# Patient Record
Sex: Male | Born: 1950
Health system: Southern US, Community
[De-identification: ages and names within clinical notes are randomized; demographics above are authoritative.]

## PROBLEM LIST (undated history)

## (undated) DIAGNOSIS — G2581 Restless legs syndrome: Secondary | ICD-10-CM

## (undated) DIAGNOSIS — M1990 Unspecified osteoarthritis, unspecified site: Secondary | ICD-10-CM

## (undated) DIAGNOSIS — M069 Rheumatoid arthritis, unspecified: Secondary | ICD-10-CM

## (undated) DIAGNOSIS — I10 Essential (primary) hypertension: Secondary | ICD-10-CM

## (undated) DIAGNOSIS — G629 Polyneuropathy, unspecified: Secondary | ICD-10-CM

## (undated) DIAGNOSIS — I509 Heart failure, unspecified: Secondary | ICD-10-CM

## (undated) DIAGNOSIS — Z87442 Personal history of urinary calculi: Secondary | ICD-10-CM

## (undated) DIAGNOSIS — H269 Unspecified cataract: Secondary | ICD-10-CM

## (undated) DIAGNOSIS — F419 Anxiety disorder, unspecified: Secondary | ICD-10-CM

## (undated) DIAGNOSIS — J439 Emphysema, unspecified: Secondary | ICD-10-CM

## (undated) DIAGNOSIS — T7840XA Allergy, unspecified, initial encounter: Secondary | ICD-10-CM

## (undated) DIAGNOSIS — K219 Gastro-esophageal reflux disease without esophagitis: Secondary | ICD-10-CM

## (undated) DIAGNOSIS — R7303 Prediabetes: Secondary | ICD-10-CM

## (undated) DIAGNOSIS — G4730 Sleep apnea, unspecified: Secondary | ICD-10-CM

## (undated) DIAGNOSIS — G4733 Obstructive sleep apnea (adult) (pediatric): Secondary | ICD-10-CM

## (undated) DIAGNOSIS — E785 Hyperlipidemia, unspecified: Secondary | ICD-10-CM

## (undated) DIAGNOSIS — M4800 Spinal stenosis, site unspecified: Secondary | ICD-10-CM

## (undated) HISTORY — DX: Spinal stenosis, site unspecified: M48.00

## (undated) HISTORY — DX: Heart failure, unspecified: I50.9

## (undated) HISTORY — DX: Essential (primary) hypertension: I10

## (undated) HISTORY — DX: Hyperlipidemia, unspecified: E78.5

## (undated) HISTORY — PX: AMPUTATION TOE: SHX6595

## (undated) HISTORY — DX: Personal history of urinary calculi: Z87.442

## (undated) HISTORY — PX: APPENDECTOMY: SHX54

## (undated) HISTORY — DX: Unspecified cataract: H26.9

## (undated) HISTORY — DX: Allergy, unspecified, initial encounter: T78.40XA

## (undated) HISTORY — DX: Obstructive sleep apnea (adult) (pediatric): G47.33

## (undated) HISTORY — DX: Restless legs syndrome: G25.81

## (undated) HISTORY — DX: Rheumatoid arthritis, unspecified: M06.9

## (undated) HISTORY — DX: Emphysema, unspecified: J43.9

## (undated) HISTORY — DX: Anxiety disorder, unspecified: F41.9

## (undated) HISTORY — PX: EYE SURGERY: SHX253

## (undated) HISTORY — DX: Gastro-esophageal reflux disease without esophagitis: K21.9

## (undated) HISTORY — DX: Sleep apnea, unspecified: G47.30

## (undated) HISTORY — DX: Prediabetes: R73.03

## (undated) HISTORY — DX: Polyneuropathy, unspecified: G62.9

## (undated) HISTORY — PX: CARPAL TUNNEL RELEASE: SHX101

## (undated) HISTORY — PX: SPINE SURGERY: SHX786

## (undated) HISTORY — DX: Unspecified osteoarthritis, unspecified site: M19.90

---

## 1999-12-01 ENCOUNTER — Encounter: Payer: Self-pay | Admitting: *Deleted

## 1999-12-01 ENCOUNTER — Ambulatory Visit (HOSPITAL_COMMUNITY): Admission: RE | Admit: 1999-12-01 | Discharge: 1999-12-01 | Payer: Self-pay | Admitting: *Deleted

## 1999-12-15 ENCOUNTER — Ambulatory Visit (HOSPITAL_COMMUNITY): Admission: RE | Admit: 1999-12-15 | Discharge: 1999-12-15 | Payer: Self-pay | Admitting: *Deleted

## 1999-12-15 ENCOUNTER — Encounter: Payer: Self-pay | Admitting: *Deleted

## 1999-12-29 ENCOUNTER — Encounter: Payer: Self-pay | Admitting: *Deleted

## 1999-12-29 ENCOUNTER — Ambulatory Visit (HOSPITAL_COMMUNITY): Admission: RE | Admit: 1999-12-29 | Discharge: 1999-12-29 | Payer: Self-pay | Admitting: *Deleted

## 2000-01-31 ENCOUNTER — Encounter: Payer: Self-pay | Admitting: *Deleted

## 2000-02-06 ENCOUNTER — Ambulatory Visit (HOSPITAL_COMMUNITY): Admission: RE | Admit: 2000-02-06 | Discharge: 2000-02-07 | Payer: Self-pay | Admitting: *Deleted

## 2000-02-06 ENCOUNTER — Encounter: Payer: Self-pay | Admitting: *Deleted

## 2004-08-08 ENCOUNTER — Inpatient Hospital Stay (HOSPITAL_COMMUNITY): Admission: RE | Admit: 2004-08-08 | Discharge: 2004-08-10 | Payer: Self-pay | Admitting: Neurosurgery

## 2005-02-09 ENCOUNTER — Ambulatory Visit: Payer: Self-pay | Admitting: Oncology

## 2005-12-04 IMAGING — CR DG CHEST 2V
2 series · 2 of 2 positions shown · non-contrast
Comparison: none

CLINICAL DATA: HNP, spondylosis.   Preoperative chest. 
 CHEST, TWO VIEWS 08/03/04 
 The lungs are clear.  The heart is upper normal in size, and there are no mediastinal abnormalities. 
 IMPRESSION
 No evidence for active chest disease.

[view not recorded (1 of 2)]
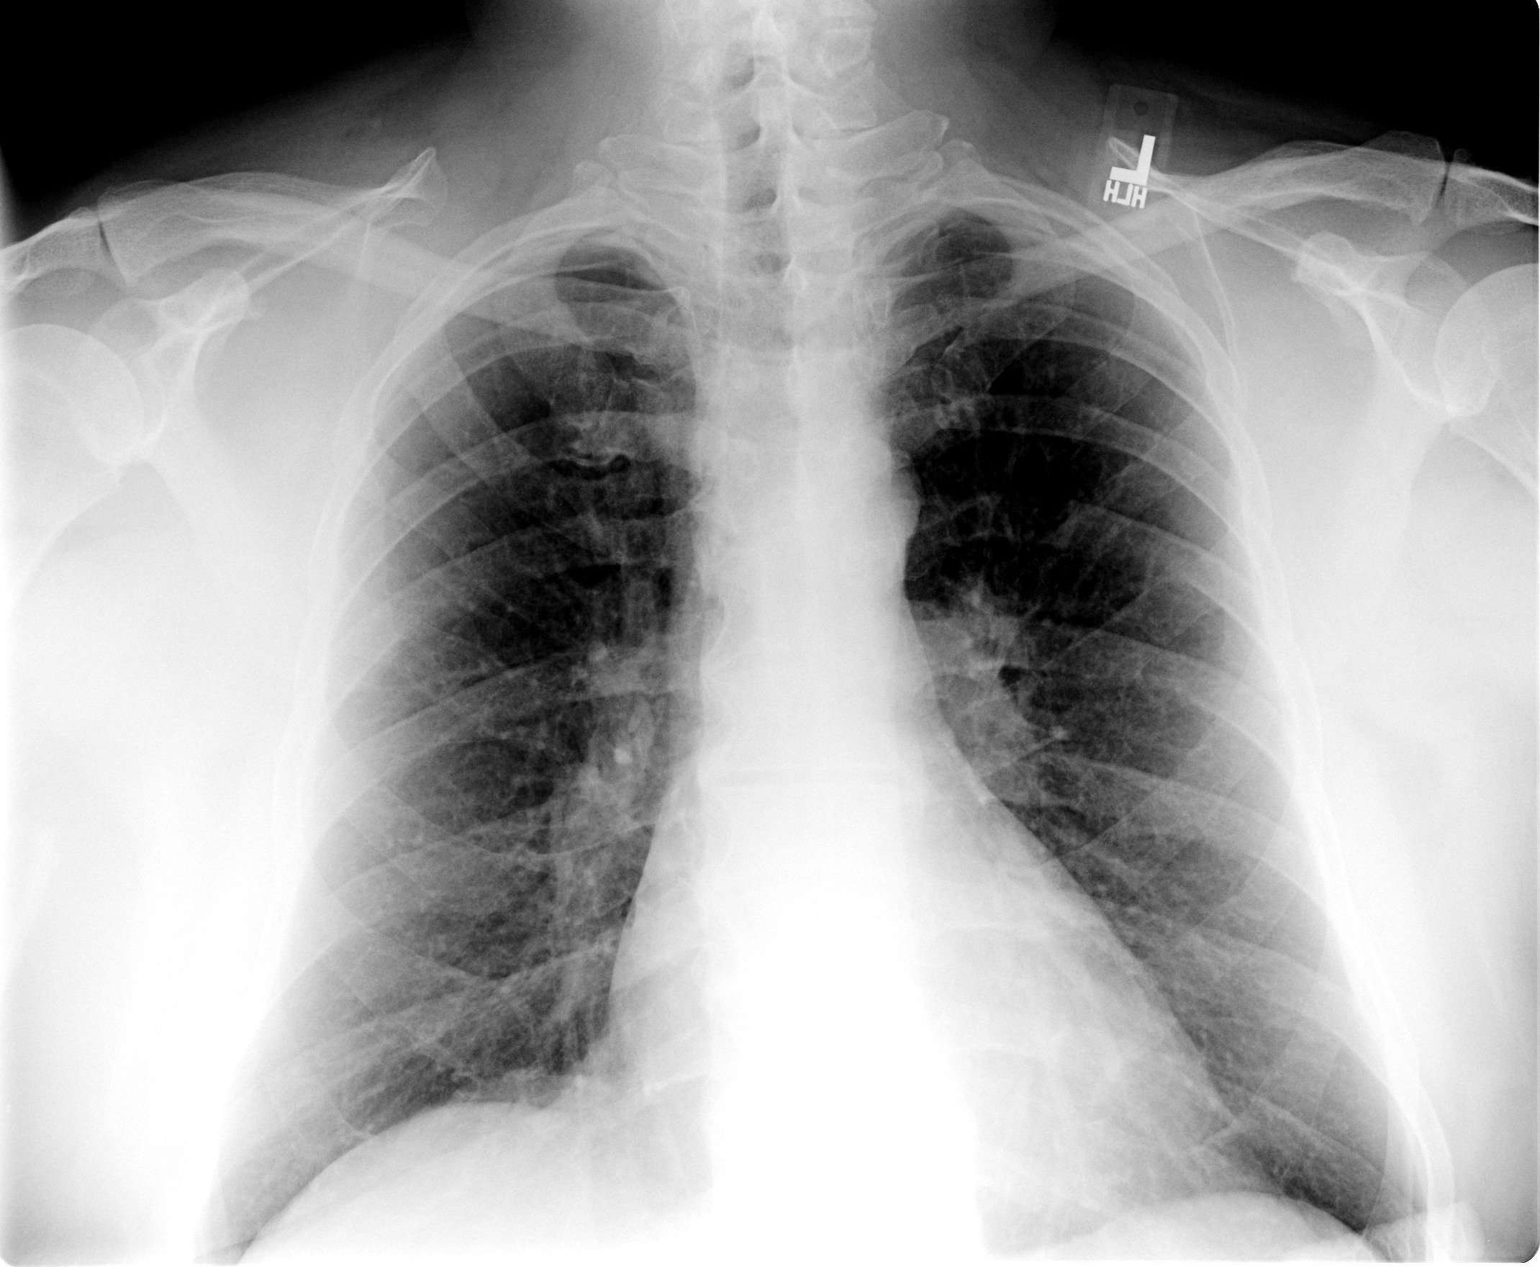

[view not recorded (2 of 2)]
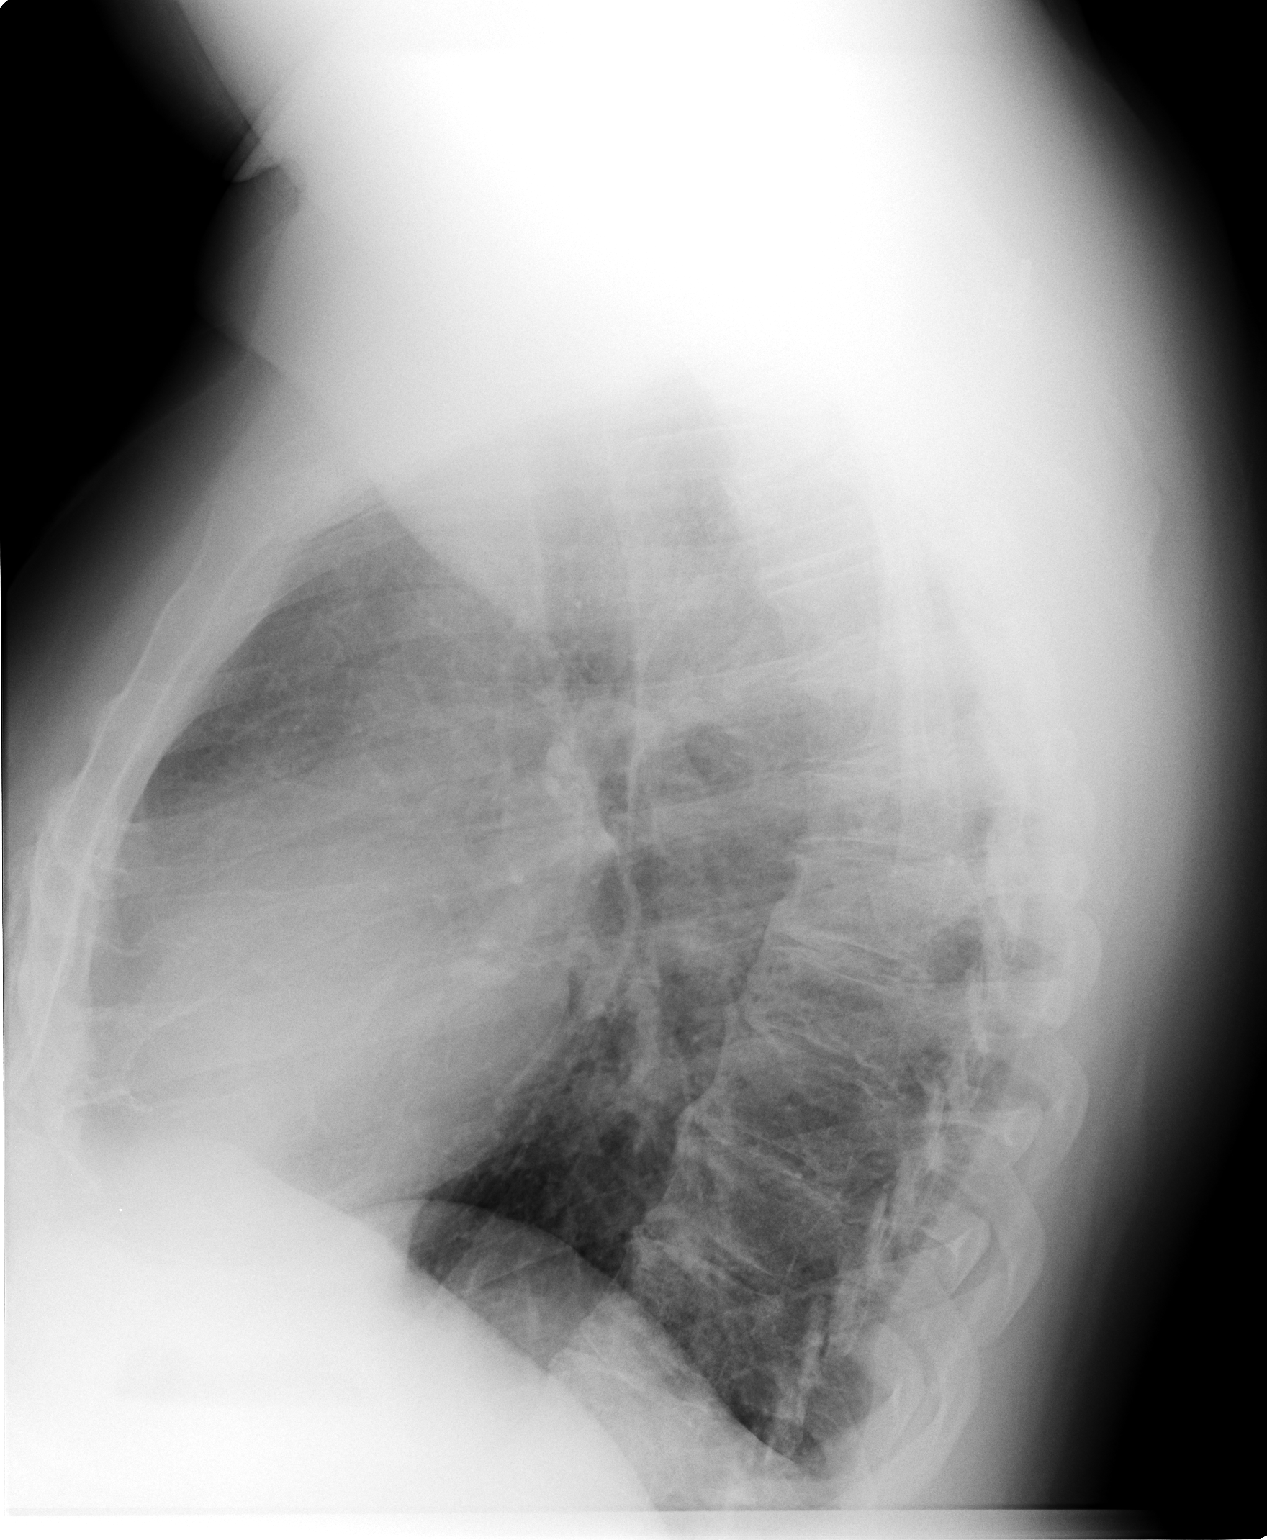

[2 of 2 positions shown; findings below may reference images not displayed]

## 2007-06-02 ENCOUNTER — Encounter: Admission: RE | Admit: 2007-06-02 | Discharge: 2007-06-02 | Payer: Self-pay | Admitting: Orthopaedic Surgery

## 2011-04-06 NOTE — Op Note (Signed)
NAME:  Shawn Osborn, Shawn Osborn NO.:  [id number]   MEDICAL RECORD NO.:  [id number]          PATIENT TYPE:  INP   LOCATION:  2899                         FACILITY:  [facility]   PHYSICIAN:  Clydene Fake, M.D.  DATE OF BIRTH:  13-Sep-1951   DATE OF PROCEDURE:  08/08/2004  DATE OF DISCHARGE:                                 OPERATIVE REPORT   PREOPERATIVE DIAGNOSIS:  Recurrent lumbar stenosis and herniated nucleus  pulposus.   POSTOPERATIVE DIAGNOSIS:  Recurrent lumbar stenosis and herniated nucleus  pulposus.   OPERATION PERFORMED:  L2-3 and 3-4 decompressive laminectomies (redo), left  L3-4 diskectomy, microdissection with microscope.   SURGEON:  Clydene Fake, M.D.   ASSISTANT:  Coletta Memos, M.D.   ANESTHESIA:  General endotracheal.   ESTIMATED BLOOD LOSS:  100 mL.   BLOOD REPLACED:  None.   DRAINS:  None.   COMPLICATIONS:  None.   INDICATIONS FOR PROCEDURE:  The patient is a 60 year old gentleman who had  decompressive lumbar laminectomy a few years ago.  He has been having  increasing back pain, pain radiating into his legs, left leg pain and MRI  was done showing recurrent stenosis, L3-4 with disk herniation on the left  side.  He is brought for redo decompressive lamina and diskectomy.   DESCRIPTION OF PROCEDURE:  The patient was brought to the operating room and  general anesthesia induced.  The patient was placed in prone position on the  Wilson frame and all pressure points padded.  The patient was prepped and  draped in sterile fashion and site of incision was injected with 20 mL of 1%  lidocaine with epinephrine.  Incision was then made at the site of the  previous midline incision on the lower lumbar spine but increased in size  more cephalad.  Incision taken down to fascia and hemostasis was obtained  with Bovie cautery.  The fascia was incised and subperiosteal dissection was  done over the L2 and 3 spinous processes and lamina out to the facets.   Down  below there was no lamina of 4-5, dissected carefully through the scar  tissue.  As we dissected out the edge of inferior to L3 inferiorly we did  not dissect down __________  midline much at that point.  Leksell rongeur  was used to perform L3 laminectomy and high speed drill was used to continue  the laminectomy and Kerrison punches and high speed drill were used to  continue laminectomy  removing the incomplete lamina of L3 and  the bottom  of L2 to get 2-3 and 3-4 decompressive laminectomy.  There was a lot of scar  tissue in the inferior margin of 3-4 and we had to carefully dissect through  this and we found some remnants of L4 lamina that we also removed.  We  dissected out laterally and got a good central decompression.  Foraminotomy  was done of the 4 roots bilaterally.  There was a lot of scar on the left  side right around the 4 root.  We had to carefully dissect through this.  Lateral ligaments were then removed  with Kerrison punches, also to  decompress the stenosis.  On the left side we explored the epidural space.  Large disk bulge and circular disk herniation was seen and inferior to disk  space there was subligamentous fragment within a tear in this ligament with  huge amounts of free fragment out under the central lateral dura under the 4  root.  We removed these pieces of free fragment disk and after doing so,  were able to decompress the 4 root and central canal well.  We explored this  disk area some more, followed this tear up into the disk space. The disk  space was incised with a 15 blade and diskectomy performed with pituitary  rongeurs and curettes.  When we were finished, we had good decompression of  the central canal and lateral recesses.  He had nerve roots in the left  side, 3 and 4 roots were decompressed.  Osteophyte removers were used to  remove osteophytes from the end plates of L3 and 4 on the left side.  Wound  was irrigated with antibiotic  solution.  Hemostasis obtained with Gelfoam  and thrombin.  This was then irrigated out.  Retractors were removed.  Note  microscope was brought in for microdissection during the case __________  laminectomy throughout the dissection through scar and diskectomy.  Wound  was irrigated with antibiotic solution. Retractors were removed.  Fascia was  closed with 0 Vicryl interrupted suture.  The subcutaneous tissue was closed  with 0, 2-0 and 3-0 Vicryl interrupted suture.  Skin closed with Benzoin and  Steri-Strips.  Dressing was placed.  The patient was placed back in supine  position, awakened from anesthesia and transferred to recovery room in  stable condition.       JRH/MEDQ  D:  08/08/2004  T:  08/09/2004  Job:  [id number]

## 2011-04-06 NOTE — H&P (Signed)
[place]  Patient:    Shawn Osborn, Shawn Osborn                      MRN: Adm. Date:  02/05/00 Attending:  Reynolds Bowl, M.D.                         History and Physical  HISTORY OF PRESENT ILLNESS:  Mr. Cosman is a 60 year old man with complaint that for two years he has been having various degrees of back problems, and over the  last six to 12 months has been getting worse, such that now standing or walking for short distances causes pain to radiate down to his feet, along with a sensation of burning bilaterally.  He has not had any bowel or bladder or sexual dysfunction. He has been evaluated with MRI and found to have fairly severe stenosis at L4-5 and less so at L3-4 and L5-S1.  We discussed decompression, possible complications, the fact that there are no guaranties.  The patient has elected to proceed on with surgery.  ALLERGIES:  The patient states that he has had PENICILLIN in the past and he passed out, and believes that he also had a rash.  Was told not to take PENICILLIN any more.  SOCIAL HISTORY:  He smokes one pack to 1-1/2 packs of cigarettes per day.  PAST MEDICAL HISTORY/REVIEW OF SYSTEMS:  He has a history of high blood pressure but cannot recall the exact medication he is taking.  He has a prior history of  kidney stones.  Otherwise the review of systems is negative except when we discussed hemorrhoids.  He said he remembers he has hemorrhoids and a history of intermittent hemorrhoid bleeding.  He was evaluated in 2000, and his doctor advised him that his prostate was all right, but he should see a doctor or a surgeon regarding possible hemorrhoid surgery.  PHYSICAL EXAMINATION:  VITAL SIGNS:  Temperature 97.5 degrees, pulse 60, respirations 12, blood pressure 126/88.  He is 5 feet 11 inches, 292 pounds.  GENERAL:  He is very pleasant.  Does not seem to be in pain.  NEUROLOGIC/MUSCULOSKELETAL:  He can toe walk,  heel walk, and get up on the stool without problems.  Straight leg raising is negative, other than causing some low back discomfort.  Both ankle reflexes are 0.  Knee reflexes are 1+.  Manual motor examination is normal.  There is no pretibial edema.  There is normal distribution of hair.  The skin is warm.  There is a good dorsalis pedis pulse.  HEENT:  Pupils equal, reactive to light and accommodation.  Extraocular movements full.  Tympanic membranes normal.  Pharynx clear except for two dentures.  NECK:  Moves well without discomfort.  No palpable mass.  No carotid bruits.  CHEST:  Expansion fair.  Breath sounds, volume fair.  Sounds are clear.  HEART:  A regular rhythm, no murmurs heard.  ABDOMEN:  Obese, no palpable masses, no bruits heard.  X-rays and MRI show severe stenosis at L4-5 and less stenosis at L3-4 and L5-S1.  PLAN:  Today we discussed again the possible complications from decompression, he fact that there are no guaranties.  He seemed to understand, as did his wife.  wrote a prescription for Tylox and discussed the postoperative course, gradual increase in activities. I will plan on seeing him back in the office one week postoperatively.     His pelvis  is level.  No focal tenderness.  DD:  02/05/00 TD:  02/05/00 Job: [id number]

## 2011-12-11 ENCOUNTER — Encounter: Payer: Self-pay | Admitting: Vascular Surgery

## 2011-12-12 ENCOUNTER — Encounter: Payer: Self-pay | Admitting: Vascular Surgery

## 2011-12-13 ENCOUNTER — Ambulatory Visit (INDEPENDENT_AMBULATORY_CARE_PROVIDER_SITE_OTHER): Payer: PRIVATE HEALTH INSURANCE | Admitting: Vascular Surgery

## 2011-12-13 ENCOUNTER — Encounter: Payer: Self-pay | Admitting: Vascular Surgery

## 2011-12-13 ENCOUNTER — Ambulatory Visit (INDEPENDENT_AMBULATORY_CARE_PROVIDER_SITE_OTHER): Payer: PRIVATE HEALTH INSURANCE | Admitting: *Deleted

## 2011-12-13 VITALS — BP 154/83 | HR 75 | Resp 18 | Ht 71.0 in | Wt 299.8 lb

## 2011-12-13 DIAGNOSIS — H34219 Partial retinal artery occlusion, unspecified eye: Secondary | ICD-10-CM

## 2011-12-13 DIAGNOSIS — I6529 Occlusion and stenosis of unspecified carotid artery: Secondary | ICD-10-CM | POA: Insufficient documentation

## 2011-12-13 HISTORY — DX: Occlusion and stenosis of unspecified carotid artery: I65.29

## 2011-12-13 NOTE — Progress Notes (Signed)
History of Present Illness:  Patient is a 61 y.o. year old male who presents for evaluation of carotid stenosis.  Symptoms related to this stenosis include evidence of retinal emboli on ophthalmologic exam.  The patient denies symptoms of TIA, amaurosis, or stroke. The patient denies any visual changes. His initial ophthalmologic visit was prompted by erythema and pain in his left eye. The patient is currently on Plavix antiplatelet therapy.  The patient underwent a thorough investigation of this including an MRI of the brain which showed no stroke, MRA of the head which showed no significant carotid stenosis, carotid duplex exam which showed external carotid stenosis but no internal carotid stenosis.  Other medical problems include CHF, hyperlipidemia, hypertension, borderline diabetes, arthritis.  These are currently stable and followed by his primary care physician. He smokes 1 pack of cigarettes per day  Past Medical History  Diagnosis Date  . CHF (congestive heart failure)   . Hyperlipidemia   . Hypertension   . Chronic bronchitis   . GERD (gastroesophageal reflux disease)   . History of kidney stones   . Arthritis     Gout, osteoarthritis in Back and Knees  . Rheumatoid arthritis   . Prediabetes   . RLS (restless legs syndrome)   . 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 Obstructive sleep apnea   . Spinal stenosis     Past Surgical History  Procedure Date  . Appendectomy   . Spine surgery 1998, 2003, 2012    Laminectomy X 3  . Carpal tunnel release      Social History History  Substance Use Topics  . Smoking status: Current Everyday Smoker -- 1.0 packs/day  . Smokeless tobacco: Not on file  . Alcohol Use: No    Family History Family History  Problem Relation Age of Onset  . Heart disease Mother   . Hypertension Mother     Allergies  Allergies  Allergen Reactions  . Avelox (Moxifloxacin Hcl In Nacl) Swelling  . Codeine Phosphate Itching    . Cozaar   . Cymbalta (Duloxetine Hcl)     Twitching  . Statins     myalgia  . Welchol (Colesevelam Hcl)   . Penicillins Rash     Current Outpatient Prescriptions  Medication Sig Dispense Refill  . albuterol (PROVENTIL) (2.5 MG/3ML) 0.083% nebulizer solution Take 2.5 mg by nebulization every 6 (six) hours as needed.      Marland Kitchen aspirin 81 MG tablet Take 81 mg by mouth daily.      . budesonide-formoterol (SYMBICORT) 160-4.5 MCG/ACT inhaler Inhale 2 puffs into the lungs 2 (two) times daily.      . clonazePAM (KLONOPIN) 1 MG tablet Take 1 mg by mouth at bedtime as needed.      . cloNIDine (CATAPRES) 0.3 MG tablet Take 0.3 mg by mouth 2 (two) times daily.      Marland Kitchen FOLATE-B12-INTRINSIC FACTOR PO Take by mouth daily.       . folic acid (FOLVITE) 800 MCG tablet Take 400 mcg by mouth daily.      Marland Kitchen HYDROcodone-acetaminophen (NORCO) 10-325 MG per tablet Take 1 tablet by mouth every 8 (eight) hours as needed.      . hydroxychloroquine (PLAQUENIL) 200 MG tablet Take by mouth 2 (two) times daily.      . methotrexate (RHEUMATREX) 2.5 MG tablet Take 2.5 mg by mouth once a week. Caution:Chemotherapy. Protect from light.    Take 8 tablets weekly      .  mometasone (NASONEX) 50 MCG/ACT nasal spray Place 2 sprays into the nose daily.      Marland Kitchen omega-3 acid ethyl esters (LOVAZA) 1 G capsule Take 2 g by mouth 2 (two) times daily.      Marland Kitchen omeprazole (PRILOSEC) 20 MG capsule Take 20 mg by mouth 2 (two) times daily.      . quinapril (ACCUPRIL) 40 MG tablet Take 40 mg by mouth 2 (two) times daily.      Marland Kitchen rOPINIRole (REQUIP) 2 MG tablet Take 2 mg by mouth at bedtime.      . furosemide (LASIX) 20 MG tablet Take 20 mg by mouth as needed.      . predniSONE (DELTASONE) 20 MG tablet Take 20 mg by mouth 2 (two) times daily.      . traZODone (DESYREL) 100 MG tablet Take 100 mg by mouth at bedtime.      . valACYclovir (VALTREX) 500 MG tablet Take 500 mg by mouth as needed.        ROS:   General:  No weight loss, Fever,  chills  HEENT: No recent headaches, no nasal bleeding, no visual changes, no sore throat  Neurologic: No dizziness, blackouts, seizures. No recent symptoms of stroke or mini- stroke. No recent episodes of slurred speech, or temporary blindness.  Cardiac: No recent episodes of chest pain/pressure, no shortness of breath at rest.  No shortness of breath with exertion.  Denies history of atrial fibrillation or irregular heartbeat  Vascular: No history of rest pain in feet.  No history of claudication.  No history of non-healing ulcer, No history of DVT   Pulmonary: No home oxygen, no productive cough, no hemoptysis,  No asthma or wheezing  Musculoskeletal:  [x ] Arthritis, [x ] Low back pain,  [x ] Joint pain  Hematologic:No history of hypercoagulable state.  No history of easy bleeding.  No history of anemia  Gastrointestinal: No hematochezia or melena,  No gastroesophageal reflux, no trouble swallowing  Urinary: [ ]  chronic Kidney disease, [ ]  on HD - [ ]  MWF or [ ]  TTHS, [ ]  Burning with urination, [ ]  Frequent urination, [ ]  Difficulty urinating;   Skin: No rashes  Psychological: No history of anxiety,  No history of depression   Physical Examination  Filed Vitals:   12/13/11 1412  BP: 154/83  Pulse: 75  Resp: 18  Height: 5\' 11"  (1.803 m)  Weight: 299 lb 12.8 oz (135.988 kg)    Body mass index is 41.81 kg/(m^2).  General:  Alert and oriented, no acute distress HEENT: Normal Neck: No bruit or JVD Pulmonary: Clear to auscultation bilaterally Cardiac: Regular Rate and Rhythm without murmur Gastrointestinal: Soft, non-tender, non-distended, no mass, obese Skin: No rash Extremity Pulses:  2+ radial, brachial, femoral pulses bilaterally Musculoskeletal: No deformity or edema  Neurologic: Upper and lower extremity motor 5/5 and symmetric  DATA: He had a repeat carotid duplex exam in our office today which showed less than 40% stenosis bilaterally with some irregularity of  the intima. I reviewed and interpreted this study   ASSESSMENT: Evidence of retinal emboli with no significant carotid bifurcation internal carotid artery stenosis   PLAN: Continue antiplatelet therapy in the form of Plavix, followup as needed if he has recurrence or development any new neurologic symptoms  Fabienne Bruns, MD [facility] Office: [phone] Pager: [phone]    Fabienne Bruns, MD [facility] Office: [phone] Pager: [phone]

## 2011-12-18 DIAGNOSIS — I6529 Occlusion and stenosis of unspecified carotid artery: Secondary | ICD-10-CM

## 2011-12-20 NOTE — Procedures (Unsigned)
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 Diabetes:  No Cardiac:  CHF, EF of 45%, cardiomyopathy Hypertension:  Yes Smoking:  Yes Previous Surgery:  Back surgery x3 CV History: Amaurosis Fugax No, Paresthesias No, Hemiparesis No                                      RIGHT             LEFT Brachial systolic pressure:         178               178 Brachial Doppler waveforms:         Biphasic          Biphasic Vertebral direction of flow:        Antegrade         Antegrade DUPLEX VELOCITIES (cm/sec) CCA peak systolic                   109               109 ECA peak systolic                   84                293 ICA peak systolic                   79 (mid)          90 ICA end diastolic                   36                38 PLAQUE MORPHOLOGY:                  Mixed, irregular  Homogeneous, irregular PLAQUE AMOUNT:                      Mild              Moderate PLAQUE LOCATION:                    Bifurcation and ICA.                Bifurcation, ICA and ECA.  IMPRESSION: 1. 0% to 39% ICA stenosis bilaterally. 2. Left ECA stenosis.  ___________________________________________ Janetta Hora Fields, MD  SS/MEDQ  D:  12/13/2011  T:  12/13/2011  Job:  [id number]

## 2013-07-08 DIAGNOSIS — E7800 Pure hypercholesterolemia, unspecified: Secondary | ICD-10-CM | POA: Diagnosis not present

## 2013-07-08 DIAGNOSIS — G4730 Sleep apnea, unspecified: Secondary | ICD-10-CM | POA: Diagnosis not present

## 2013-07-08 DIAGNOSIS — I10 Essential (primary) hypertension: Secondary | ICD-10-CM | POA: Diagnosis not present

## 2013-07-08 DIAGNOSIS — R7301 Impaired fasting glucose: Secondary | ICD-10-CM | POA: Diagnosis not present

## 2013-07-08 DIAGNOSIS — M069 Rheumatoid arthritis, unspecified: Secondary | ICD-10-CM | POA: Diagnosis not present

## 2013-07-08 DIAGNOSIS — M545 Low back pain: Secondary | ICD-10-CM | POA: Diagnosis not present

## 2013-07-28 DIAGNOSIS — R937 Abnormal findings on diagnostic imaging of other parts of musculoskeletal system: Secondary | ICD-10-CM | POA: Diagnosis not present

## 2013-07-28 DIAGNOSIS — M25569 Pain in unspecified knee: Secondary | ICD-10-CM | POA: Diagnosis not present

## 2013-07-30 DIAGNOSIS — M2550 Pain in unspecified joint: Secondary | ICD-10-CM | POA: Diagnosis not present

## 2013-07-30 DIAGNOSIS — IMO0002 Reserved for concepts with insufficient information to code with codable children: Secondary | ICD-10-CM | POA: Diagnosis not present

## 2013-07-30 DIAGNOSIS — M1710 Unilateral primary osteoarthritis, unspecified knee: Secondary | ICD-10-CM | POA: Diagnosis not present

## 2013-07-30 DIAGNOSIS — M545 Low back pain: Secondary | ICD-10-CM | POA: Diagnosis not present

## 2013-08-03 DIAGNOSIS — Z981 Arthrodesis status: Secondary | ICD-10-CM | POA: Diagnosis not present

## 2013-08-03 DIAGNOSIS — M519 Unspecified thoracic, thoracolumbar and lumbosacral intervertebral disc disorder: Secondary | ICD-10-CM | POA: Diagnosis not present

## 2013-08-03 DIAGNOSIS — M545 Low back pain: Secondary | ICD-10-CM | POA: Diagnosis not present

## 2013-08-11 DIAGNOSIS — M2550 Pain in unspecified joint: Secondary | ICD-10-CM | POA: Diagnosis not present

## 2013-08-11 DIAGNOSIS — M48061 Spinal stenosis, lumbar region without neurogenic claudication: Secondary | ICD-10-CM | POA: Diagnosis not present

## 2013-08-11 DIAGNOSIS — Z23 Encounter for immunization: Secondary | ICD-10-CM | POA: Diagnosis not present

## 2013-08-11 DIAGNOSIS — IMO0002 Reserved for concepts with insufficient information to code with codable children: Secondary | ICD-10-CM | POA: Diagnosis not present

## 2013-08-11 DIAGNOSIS — F329 Major depressive disorder, single episode, unspecified: Secondary | ICD-10-CM | POA: Diagnosis not present

## 2013-08-11 DIAGNOSIS — M1710 Unilateral primary osteoarthritis, unspecified knee: Secondary | ICD-10-CM | POA: Diagnosis not present

## 2013-08-14 DIAGNOSIS — M4310 Spondylolisthesis, site unspecified: Secondary | ICD-10-CM | POA: Diagnosis not present

## 2013-08-14 DIAGNOSIS — M48061 Spinal stenosis, lumbar region without neurogenic claudication: Secondary | ICD-10-CM | POA: Diagnosis not present

## 2013-08-14 DIAGNOSIS — M545 Low back pain: Secondary | ICD-10-CM | POA: Diagnosis not present

## 2013-08-17 DIAGNOSIS — J441 Chronic obstructive pulmonary disease with (acute) exacerbation: Secondary | ICD-10-CM | POA: Diagnosis not present

## 2013-08-17 DIAGNOSIS — Z01810 Encounter for preprocedural cardiovascular examination: Secondary | ICD-10-CM | POA: Diagnosis not present

## 2013-08-17 DIAGNOSIS — J449 Chronic obstructive pulmonary disease, unspecified: Secondary | ICD-10-CM | POA: Diagnosis not present

## 2013-08-17 DIAGNOSIS — R233 Spontaneous ecchymoses: Secondary | ICD-10-CM | POA: Diagnosis not present

## 2013-08-17 DIAGNOSIS — I10 Essential (primary) hypertension: Secondary | ICD-10-CM | POA: Diagnosis not present

## 2013-08-17 DIAGNOSIS — F17200 Nicotine dependence, unspecified, uncomplicated: Secondary | ICD-10-CM | POA: Diagnosis not present

## 2013-08-28 DIAGNOSIS — M48061 Spinal stenosis, lumbar region without neurogenic claudication: Secondary | ICD-10-CM | POA: Diagnosis not present

## 2013-08-28 DIAGNOSIS — L600 Ingrowing nail: Secondary | ICD-10-CM | POA: Diagnosis not present

## 2013-08-28 DIAGNOSIS — J449 Chronic obstructive pulmonary disease, unspecified: Secondary | ICD-10-CM | POA: Diagnosis not present

## 2013-08-28 DIAGNOSIS — F17200 Nicotine dependence, unspecified, uncomplicated: Secondary | ICD-10-CM | POA: Diagnosis not present

## 2013-09-11 DIAGNOSIS — M48061 Spinal stenosis, lumbar region without neurogenic claudication: Secondary | ICD-10-CM | POA: Diagnosis not present

## 2013-09-11 DIAGNOSIS — L600 Ingrowing nail: Secondary | ICD-10-CM | POA: Diagnosis not present

## 2013-09-11 DIAGNOSIS — F17200 Nicotine dependence, unspecified, uncomplicated: Secondary | ICD-10-CM | POA: Diagnosis not present

## 2013-09-11 DIAGNOSIS — J449 Chronic obstructive pulmonary disease, unspecified: Secondary | ICD-10-CM | POA: Diagnosis not present

## 2013-09-18 DIAGNOSIS — F17200 Nicotine dependence, unspecified, uncomplicated: Secondary | ICD-10-CM | POA: Diagnosis not present

## 2013-09-18 DIAGNOSIS — L600 Ingrowing nail: Secondary | ICD-10-CM | POA: Diagnosis not present

## 2013-09-18 DIAGNOSIS — F329 Major depressive disorder, single episode, unspecified: Secondary | ICD-10-CM | POA: Diagnosis not present

## 2013-10-02 DIAGNOSIS — Z9889 Other specified postprocedural states: Secondary | ICD-10-CM | POA: Diagnosis not present

## 2013-10-02 DIAGNOSIS — M47817 Spondylosis without myelopathy or radiculopathy, lumbosacral region: Secondary | ICD-10-CM | POA: Diagnosis not present

## 2013-10-02 DIAGNOSIS — M4310 Spondylolisthesis, site unspecified: Secondary | ICD-10-CM | POA: Diagnosis not present

## 2013-10-02 DIAGNOSIS — M961 Postlaminectomy syndrome, not elsewhere classified: Secondary | ICD-10-CM | POA: Diagnosis not present

## 2013-10-02 DIAGNOSIS — J449 Chronic obstructive pulmonary disease, unspecified: Secondary | ICD-10-CM | POA: Diagnosis not present

## 2013-10-02 DIAGNOSIS — I10 Essential (primary) hypertension: Secondary | ICD-10-CM | POA: Diagnosis not present

## 2013-10-02 DIAGNOSIS — M545 Low back pain: Secondary | ICD-10-CM | POA: Diagnosis not present

## 2013-10-02 DIAGNOSIS — M069 Rheumatoid arthritis, unspecified: Secondary | ICD-10-CM | POA: Diagnosis not present

## 2013-10-02 DIAGNOSIS — Z981 Arthrodesis status: Secondary | ICD-10-CM | POA: Diagnosis not present

## 2013-10-02 DIAGNOSIS — M48061 Spinal stenosis, lumbar region without neurogenic claudication: Secondary | ICD-10-CM | POA: Diagnosis not present

## 2013-10-02 DIAGNOSIS — M5106 Intervertebral disc disorders with myelopathy, lumbar region: Secondary | ICD-10-CM | POA: Diagnosis not present

## 2013-10-02 DIAGNOSIS — Z23 Encounter for immunization: Secondary | ICD-10-CM | POA: Diagnosis not present

## 2013-10-02 DIAGNOSIS — IMO0002 Reserved for concepts with insufficient information to code with codable children: Secondary | ICD-10-CM | POA: Diagnosis not present

## 2013-10-02 DIAGNOSIS — T84498A Other mechanical complication of other internal orthopedic devices, implants and grafts, initial encounter: Secondary | ICD-10-CM | POA: Diagnosis not present

## 2013-10-02 DIAGNOSIS — R5381 Other malaise: Secondary | ICD-10-CM | POA: Diagnosis not present

## 2013-10-02 DIAGNOSIS — M47814 Spondylosis without myelopathy or radiculopathy, thoracic region: Secondary | ICD-10-CM | POA: Diagnosis not present

## 2013-10-02 DIAGNOSIS — Z5189 Encounter for other specified aftercare: Secondary | ICD-10-CM | POA: Diagnosis not present

## 2013-10-02 DIAGNOSIS — Q762 Congenital spondylolisthesis: Secondary | ICD-10-CM | POA: Diagnosis not present

## 2013-10-08 DIAGNOSIS — F411 Generalized anxiety disorder: Secondary | ICD-10-CM | POA: Diagnosis not present

## 2013-10-08 DIAGNOSIS — M069 Rheumatoid arthritis, unspecified: Secondary | ICD-10-CM | POA: Diagnosis not present

## 2013-10-08 DIAGNOSIS — F329 Major depressive disorder, single episode, unspecified: Secondary | ICD-10-CM | POA: Diagnosis not present

## 2013-10-08 DIAGNOSIS — Z9889 Other specified postprocedural states: Secondary | ICD-10-CM | POA: Diagnosis not present

## 2013-10-08 DIAGNOSIS — M47814 Spondylosis without myelopathy or radiculopathy, thoracic region: Secondary | ICD-10-CM | POA: Diagnosis not present

## 2013-10-08 DIAGNOSIS — G2581 Restless legs syndrome: Secondary | ICD-10-CM | POA: Diagnosis not present

## 2013-10-08 DIAGNOSIS — F17200 Nicotine dependence, unspecified, uncomplicated: Secondary | ICD-10-CM | POA: Diagnosis not present

## 2013-10-08 DIAGNOSIS — E669 Obesity, unspecified: Secondary | ICD-10-CM | POA: Diagnosis not present

## 2013-10-08 DIAGNOSIS — K219 Gastro-esophageal reflux disease without esophagitis: Secondary | ICD-10-CM | POA: Diagnosis not present

## 2013-10-08 DIAGNOSIS — Z5189 Encounter for other specified aftercare: Secondary | ICD-10-CM | POA: Diagnosis not present

## 2013-10-08 DIAGNOSIS — J449 Chronic obstructive pulmonary disease, unspecified: Secondary | ICD-10-CM | POA: Diagnosis not present

## 2013-10-08 DIAGNOSIS — G4733 Obstructive sleep apnea (adult) (pediatric): Secondary | ICD-10-CM | POA: Diagnosis not present

## 2013-10-08 DIAGNOSIS — I10 Essential (primary) hypertension: Secondary | ICD-10-CM | POA: Diagnosis not present

## 2013-10-08 DIAGNOSIS — M216X9 Other acquired deformities of unspecified foot: Secondary | ICD-10-CM | POA: Diagnosis not present

## 2013-10-08 DIAGNOSIS — R5381 Other malaise: Secondary | ICD-10-CM | POA: Diagnosis not present

## 2013-10-08 DIAGNOSIS — M109 Gout, unspecified: Secondary | ICD-10-CM | POA: Diagnosis not present

## 2013-10-21 DIAGNOSIS — I10 Essential (primary) hypertension: Secondary | ICD-10-CM | POA: Diagnosis not present

## 2013-10-21 DIAGNOSIS — F329 Major depressive disorder, single episode, unspecified: Secondary | ICD-10-CM | POA: Diagnosis not present

## 2013-10-21 DIAGNOSIS — J441 Chronic obstructive pulmonary disease with (acute) exacerbation: Secondary | ICD-10-CM | POA: Diagnosis not present

## 2013-10-21 DIAGNOSIS — IMO0002 Reserved for concepts with insufficient information to code with codable children: Secondary | ICD-10-CM | POA: Diagnosis not present

## 2013-10-21 DIAGNOSIS — Z4789 Encounter for other orthopedic aftercare: Secondary | ICD-10-CM | POA: Diagnosis not present

## 2013-10-23 DIAGNOSIS — I10 Essential (primary) hypertension: Secondary | ICD-10-CM | POA: Diagnosis not present

## 2013-10-23 DIAGNOSIS — J441 Chronic obstructive pulmonary disease with (acute) exacerbation: Secondary | ICD-10-CM | POA: Diagnosis not present

## 2013-10-23 DIAGNOSIS — Z4789 Encounter for other orthopedic aftercare: Secondary | ICD-10-CM | POA: Diagnosis not present

## 2013-10-23 DIAGNOSIS — F329 Major depressive disorder, single episode, unspecified: Secondary | ICD-10-CM | POA: Diagnosis not present

## 2013-10-23 DIAGNOSIS — IMO0002 Reserved for concepts with insufficient information to code with codable children: Secondary | ICD-10-CM | POA: Diagnosis not present

## 2013-10-26 DIAGNOSIS — I10 Essential (primary) hypertension: Secondary | ICD-10-CM | POA: Diagnosis not present

## 2013-10-26 DIAGNOSIS — IMO0002 Reserved for concepts with insufficient information to code with codable children: Secondary | ICD-10-CM | POA: Diagnosis not present

## 2013-10-26 DIAGNOSIS — Z4789 Encounter for other orthopedic aftercare: Secondary | ICD-10-CM | POA: Diagnosis not present

## 2013-10-26 DIAGNOSIS — F329 Major depressive disorder, single episode, unspecified: Secondary | ICD-10-CM | POA: Diagnosis not present

## 2013-10-26 DIAGNOSIS — J441 Chronic obstructive pulmonary disease with (acute) exacerbation: Secondary | ICD-10-CM | POA: Diagnosis not present

## 2013-10-27 DIAGNOSIS — IMO0002 Reserved for concepts with insufficient information to code with codable children: Secondary | ICD-10-CM | POA: Diagnosis not present

## 2013-10-27 DIAGNOSIS — Z4789 Encounter for other orthopedic aftercare: Secondary | ICD-10-CM | POA: Diagnosis not present

## 2013-10-27 DIAGNOSIS — I10 Essential (primary) hypertension: Secondary | ICD-10-CM | POA: Diagnosis not present

## 2013-10-27 DIAGNOSIS — F329 Major depressive disorder, single episode, unspecified: Secondary | ICD-10-CM | POA: Diagnosis not present

## 2013-10-27 DIAGNOSIS — J441 Chronic obstructive pulmonary disease with (acute) exacerbation: Secondary | ICD-10-CM | POA: Diagnosis not present

## 2013-10-28 DIAGNOSIS — I10 Essential (primary) hypertension: Secondary | ICD-10-CM | POA: Diagnosis not present

## 2013-10-28 DIAGNOSIS — Z4789 Encounter for other orthopedic aftercare: Secondary | ICD-10-CM | POA: Diagnosis not present

## 2013-10-28 DIAGNOSIS — F329 Major depressive disorder, single episode, unspecified: Secondary | ICD-10-CM | POA: Diagnosis not present

## 2013-10-28 DIAGNOSIS — IMO0002 Reserved for concepts with insufficient information to code with codable children: Secondary | ICD-10-CM | POA: Diagnosis not present

## 2013-10-28 DIAGNOSIS — J441 Chronic obstructive pulmonary disease with (acute) exacerbation: Secondary | ICD-10-CM | POA: Diagnosis not present

## 2013-10-29 DIAGNOSIS — Z4789 Encounter for other orthopedic aftercare: Secondary | ICD-10-CM | POA: Diagnosis not present

## 2013-10-29 DIAGNOSIS — IMO0002 Reserved for concepts with insufficient information to code with codable children: Secondary | ICD-10-CM | POA: Diagnosis not present

## 2013-10-29 DIAGNOSIS — I10 Essential (primary) hypertension: Secondary | ICD-10-CM | POA: Diagnosis not present

## 2013-10-29 DIAGNOSIS — J441 Chronic obstructive pulmonary disease with (acute) exacerbation: Secondary | ICD-10-CM | POA: Diagnosis not present

## 2013-10-29 DIAGNOSIS — F329 Major depressive disorder, single episode, unspecified: Secondary | ICD-10-CM | POA: Diagnosis not present

## 2013-11-02 DIAGNOSIS — R7309 Other abnormal glucose: Secondary | ICD-10-CM | POA: Diagnosis not present

## 2013-11-02 DIAGNOSIS — R7301 Impaired fasting glucose: Secondary | ICD-10-CM | POA: Diagnosis not present

## 2013-11-02 DIAGNOSIS — M545 Low back pain: Secondary | ICD-10-CM | POA: Diagnosis not present

## 2013-11-02 DIAGNOSIS — M48061 Spinal stenosis, lumbar region without neurogenic claudication: Secondary | ICD-10-CM | POA: Diagnosis not present

## 2013-11-02 DIAGNOSIS — F329 Major depressive disorder, single episode, unspecified: Secondary | ICD-10-CM | POA: Diagnosis not present

## 2013-11-02 DIAGNOSIS — I10 Essential (primary) hypertension: Secondary | ICD-10-CM | POA: Diagnosis not present

## 2013-11-02 DIAGNOSIS — M79609 Pain in unspecified limb: Secondary | ICD-10-CM | POA: Diagnosis not present

## 2013-11-02 DIAGNOSIS — E7800 Pure hypercholesterolemia, unspecified: Secondary | ICD-10-CM | POA: Diagnosis not present

## 2013-11-03 DIAGNOSIS — F329 Major depressive disorder, single episode, unspecified: Secondary | ICD-10-CM | POA: Diagnosis not present

## 2013-11-03 DIAGNOSIS — I10 Essential (primary) hypertension: Secondary | ICD-10-CM | POA: Diagnosis not present

## 2013-11-03 DIAGNOSIS — IMO0002 Reserved for concepts with insufficient information to code with codable children: Secondary | ICD-10-CM | POA: Diagnosis not present

## 2013-11-03 DIAGNOSIS — Z4789 Encounter for other orthopedic aftercare: Secondary | ICD-10-CM | POA: Diagnosis not present

## 2013-11-03 DIAGNOSIS — J441 Chronic obstructive pulmonary disease with (acute) exacerbation: Secondary | ICD-10-CM | POA: Diagnosis not present

## 2013-11-05 DIAGNOSIS — F329 Major depressive disorder, single episode, unspecified: Secondary | ICD-10-CM | POA: Diagnosis not present

## 2013-11-05 DIAGNOSIS — Z4789 Encounter for other orthopedic aftercare: Secondary | ICD-10-CM | POA: Diagnosis not present

## 2013-11-05 DIAGNOSIS — IMO0002 Reserved for concepts with insufficient information to code with codable children: Secondary | ICD-10-CM | POA: Diagnosis not present

## 2013-11-05 DIAGNOSIS — J441 Chronic obstructive pulmonary disease with (acute) exacerbation: Secondary | ICD-10-CM | POA: Diagnosis not present

## 2013-11-05 DIAGNOSIS — I10 Essential (primary) hypertension: Secondary | ICD-10-CM | POA: Diagnosis not present

## 2013-11-05 DIAGNOSIS — D649 Anemia, unspecified: Secondary | ICD-10-CM | POA: Diagnosis not present

## 2013-11-06 DIAGNOSIS — IMO0002 Reserved for concepts with insufficient information to code with codable children: Secondary | ICD-10-CM | POA: Diagnosis not present

## 2013-11-06 DIAGNOSIS — Z4789 Encounter for other orthopedic aftercare: Secondary | ICD-10-CM | POA: Diagnosis not present

## 2013-11-06 DIAGNOSIS — I10 Essential (primary) hypertension: Secondary | ICD-10-CM | POA: Diagnosis not present

## 2013-11-06 DIAGNOSIS — J441 Chronic obstructive pulmonary disease with (acute) exacerbation: Secondary | ICD-10-CM | POA: Diagnosis not present

## 2013-11-06 DIAGNOSIS — F329 Major depressive disorder, single episode, unspecified: Secondary | ICD-10-CM | POA: Diagnosis not present

## 2013-11-10 DIAGNOSIS — J441 Chronic obstructive pulmonary disease with (acute) exacerbation: Secondary | ICD-10-CM | POA: Diagnosis not present

## 2013-11-10 DIAGNOSIS — I10 Essential (primary) hypertension: Secondary | ICD-10-CM | POA: Diagnosis not present

## 2013-11-10 DIAGNOSIS — IMO0002 Reserved for concepts with insufficient information to code with codable children: Secondary | ICD-10-CM | POA: Diagnosis not present

## 2013-11-10 DIAGNOSIS — Z4789 Encounter for other orthopedic aftercare: Secondary | ICD-10-CM | POA: Diagnosis not present

## 2013-11-10 DIAGNOSIS — F329 Major depressive disorder, single episode, unspecified: Secondary | ICD-10-CM | POA: Diagnosis not present

## 2013-11-11 DIAGNOSIS — F329 Major depressive disorder, single episode, unspecified: Secondary | ICD-10-CM | POA: Diagnosis not present

## 2013-11-11 DIAGNOSIS — J441 Chronic obstructive pulmonary disease with (acute) exacerbation: Secondary | ICD-10-CM | POA: Diagnosis not present

## 2013-11-11 DIAGNOSIS — Z4789 Encounter for other orthopedic aftercare: Secondary | ICD-10-CM | POA: Diagnosis not present

## 2013-11-11 DIAGNOSIS — IMO0002 Reserved for concepts with insufficient information to code with codable children: Secondary | ICD-10-CM | POA: Diagnosis not present

## 2013-11-11 DIAGNOSIS — I10 Essential (primary) hypertension: Secondary | ICD-10-CM | POA: Diagnosis not present

## 2013-11-17 DIAGNOSIS — N390 Urinary tract infection, site not specified: Secondary | ICD-10-CM | POA: Diagnosis not present

## 2013-11-17 DIAGNOSIS — J0100 Acute maxillary sinusitis, unspecified: Secondary | ICD-10-CM | POA: Diagnosis not present

## 2013-11-17 DIAGNOSIS — N3000 Acute cystitis without hematuria: Secondary | ICD-10-CM | POA: Diagnosis not present

## 2013-12-07 DIAGNOSIS — D649 Anemia, unspecified: Secondary | ICD-10-CM | POA: Diagnosis not present

## 2013-12-23 DIAGNOSIS — T84498A Other mechanical complication of other internal orthopedic devices, implants and grafts, initial encounter: Secondary | ICD-10-CM | POA: Diagnosis not present

## 2013-12-23 DIAGNOSIS — M5106 Intervertebral disc disorders with myelopathy, lumbar region: Secondary | ICD-10-CM | POA: Diagnosis not present

## 2013-12-23 DIAGNOSIS — M4800 Spinal stenosis, site unspecified: Secondary | ICD-10-CM | POA: Diagnosis not present

## 2013-12-23 DIAGNOSIS — M4310 Spondylolisthesis, site unspecified: Secondary | ICD-10-CM | POA: Diagnosis not present

## 2014-01-21 DIAGNOSIS — T84498A Other mechanical complication of other internal orthopedic devices, implants and grafts, initial encounter: Secondary | ICD-10-CM | POA: Diagnosis not present

## 2014-01-21 DIAGNOSIS — M4310 Spondylolisthesis, site unspecified: Secondary | ICD-10-CM | POA: Diagnosis not present

## 2014-01-21 DIAGNOSIS — M4800 Spinal stenosis, site unspecified: Secondary | ICD-10-CM | POA: Diagnosis not present

## 2014-01-21 DIAGNOSIS — M5106 Intervertebral disc disorders with myelopathy, lumbar region: Secondary | ICD-10-CM | POA: Diagnosis not present

## 2014-02-08 DIAGNOSIS — E7800 Pure hypercholesterolemia, unspecified: Secondary | ICD-10-CM | POA: Diagnosis not present

## 2014-02-08 DIAGNOSIS — R7309 Other abnormal glucose: Secondary | ICD-10-CM | POA: Diagnosis not present

## 2014-02-08 DIAGNOSIS — Z79899 Other long term (current) drug therapy: Secondary | ICD-10-CM | POA: Diagnosis not present

## 2014-02-08 DIAGNOSIS — R7301 Impaired fasting glucose: Secondary | ICD-10-CM | POA: Diagnosis not present

## 2014-02-08 DIAGNOSIS — F341 Dysthymic disorder: Secondary | ICD-10-CM | POA: Diagnosis not present

## 2014-02-08 DIAGNOSIS — J449 Chronic obstructive pulmonary disease, unspecified: Secondary | ICD-10-CM | POA: Diagnosis not present

## 2014-02-08 DIAGNOSIS — M48061 Spinal stenosis, lumbar region without neurogenic claudication: Secondary | ICD-10-CM | POA: Diagnosis not present

## 2014-02-26 DIAGNOSIS — M4800 Spinal stenosis, site unspecified: Secondary | ICD-10-CM | POA: Diagnosis not present

## 2014-02-26 DIAGNOSIS — M5106 Intervertebral disc disorders with myelopathy, lumbar region: Secondary | ICD-10-CM | POA: Diagnosis not present

## 2014-02-26 DIAGNOSIS — M4310 Spondylolisthesis, site unspecified: Secondary | ICD-10-CM | POA: Diagnosis not present

## 2014-02-26 DIAGNOSIS — T84498A Other mechanical complication of other internal orthopedic devices, implants and grafts, initial encounter: Secondary | ICD-10-CM | POA: Diagnosis not present

## 2014-03-24 DIAGNOSIS — M5106 Intervertebral disc disorders with myelopathy, lumbar region: Secondary | ICD-10-CM | POA: Diagnosis not present

## 2014-03-24 DIAGNOSIS — M4800 Spinal stenosis, site unspecified: Secondary | ICD-10-CM | POA: Diagnosis not present

## 2014-03-24 DIAGNOSIS — M4310 Spondylolisthesis, site unspecified: Secondary | ICD-10-CM | POA: Diagnosis not present

## 2014-03-24 DIAGNOSIS — T84498A Other mechanical complication of other internal orthopedic devices, implants and grafts, initial encounter: Secondary | ICD-10-CM | POA: Diagnosis not present

## 2014-05-25 DIAGNOSIS — M5106 Intervertebral disc disorders with myelopathy, lumbar region: Secondary | ICD-10-CM | POA: Diagnosis not present

## 2014-05-25 DIAGNOSIS — M4800 Spinal stenosis, site unspecified: Secondary | ICD-10-CM | POA: Diagnosis not present

## 2014-05-25 DIAGNOSIS — M4310 Spondylolisthesis, site unspecified: Secondary | ICD-10-CM | POA: Diagnosis not present

## 2014-05-25 DIAGNOSIS — T84498A Other mechanical complication of other internal orthopedic devices, implants and grafts, initial encounter: Secondary | ICD-10-CM | POA: Diagnosis not present

## 2014-06-03 DIAGNOSIS — J449 Chronic obstructive pulmonary disease, unspecified: Secondary | ICD-10-CM | POA: Diagnosis not present

## 2014-06-03 DIAGNOSIS — R7301 Impaired fasting glucose: Secondary | ICD-10-CM | POA: Diagnosis not present

## 2014-06-03 DIAGNOSIS — I10 Essential (primary) hypertension: Secondary | ICD-10-CM | POA: Diagnosis not present

## 2014-06-03 DIAGNOSIS — R0989 Other specified symptoms and signs involving the circulatory and respiratory systems: Secondary | ICD-10-CM | POA: Diagnosis not present

## 2014-06-03 DIAGNOSIS — M48061 Spinal stenosis, lumbar region without neurogenic claudication: Secondary | ICD-10-CM | POA: Diagnosis not present

## 2014-06-03 DIAGNOSIS — F341 Dysthymic disorder: Secondary | ICD-10-CM | POA: Diagnosis not present

## 2014-06-03 DIAGNOSIS — E7800 Pure hypercholesterolemia, unspecified: Secondary | ICD-10-CM | POA: Diagnosis not present

## 2014-06-03 DIAGNOSIS — J018 Other acute sinusitis: Secondary | ICD-10-CM | POA: Diagnosis not present

## 2014-06-03 DIAGNOSIS — R7309 Other abnormal glucose: Secondary | ICD-10-CM | POA: Diagnosis not present

## 2014-06-03 DIAGNOSIS — Z125 Encounter for screening for malignant neoplasm of prostate: Secondary | ICD-10-CM | POA: Diagnosis not present

## 2014-06-16 DIAGNOSIS — R0989 Other specified symptoms and signs involving the circulatory and respiratory systems: Secondary | ICD-10-CM | POA: Diagnosis not present

## 2014-06-16 DIAGNOSIS — M25559 Pain in unspecified hip: Secondary | ICD-10-CM | POA: Diagnosis not present

## 2014-07-13 DIAGNOSIS — Z0000 Encounter for general adult medical examination without abnormal findings: Secondary | ICD-10-CM | POA: Diagnosis not present

## 2014-07-13 DIAGNOSIS — Z1211 Encounter for screening for malignant neoplasm of colon: Secondary | ICD-10-CM | POA: Diagnosis not present

## 2014-07-23 DIAGNOSIS — Z79899 Other long term (current) drug therapy: Secondary | ICD-10-CM | POA: Diagnosis not present

## 2014-07-23 DIAGNOSIS — M4800 Spinal stenosis, site unspecified: Secondary | ICD-10-CM | POA: Diagnosis not present

## 2014-07-23 DIAGNOSIS — M4310 Spondylolisthesis, site unspecified: Secondary | ICD-10-CM | POA: Diagnosis not present

## 2014-07-23 DIAGNOSIS — T84498A Other mechanical complication of other internal orthopedic devices, implants and grafts, initial encounter: Secondary | ICD-10-CM | POA: Diagnosis not present

## 2014-07-23 DIAGNOSIS — M5106 Intervertebral disc disorders with myelopathy, lumbar region: Secondary | ICD-10-CM | POA: Diagnosis not present

## 2014-08-05 DIAGNOSIS — R1033 Periumbilical pain: Secondary | ICD-10-CM | POA: Diagnosis not present

## 2014-08-05 DIAGNOSIS — R933 Abnormal findings on diagnostic imaging of other parts of digestive tract: Secondary | ICD-10-CM | POA: Diagnosis not present

## 2014-08-05 DIAGNOSIS — R1013 Epigastric pain: Secondary | ICD-10-CM | POA: Diagnosis not present

## 2014-08-05 DIAGNOSIS — R197 Diarrhea, unspecified: Secondary | ICD-10-CM | POA: Diagnosis not present

## 2014-08-06 DIAGNOSIS — R1013 Epigastric pain: Secondary | ICD-10-CM | POA: Diagnosis not present

## 2014-08-06 DIAGNOSIS — R1033 Periumbilical pain: Secondary | ICD-10-CM | POA: Diagnosis not present

## 2014-08-06 DIAGNOSIS — R197 Diarrhea, unspecified: Secondary | ICD-10-CM | POA: Diagnosis not present

## 2014-08-13 DIAGNOSIS — K529 Noninfective gastroenteritis and colitis, unspecified: Secondary | ICD-10-CM | POA: Diagnosis not present

## 2014-08-13 DIAGNOSIS — F17200 Nicotine dependence, unspecified, uncomplicated: Secondary | ICD-10-CM | POA: Diagnosis not present

## 2014-09-10 DIAGNOSIS — I10 Essential (primary) hypertension: Secondary | ICD-10-CM | POA: Diagnosis not present

## 2014-09-10 DIAGNOSIS — E780 Pure hypercholesterolemia: Secondary | ICD-10-CM | POA: Diagnosis not present

## 2014-09-10 DIAGNOSIS — G2581 Restless legs syndrome: Secondary | ICD-10-CM | POA: Diagnosis not present

## 2014-09-10 DIAGNOSIS — E782 Mixed hyperlipidemia: Secondary | ICD-10-CM | POA: Diagnosis not present

## 2014-09-10 DIAGNOSIS — J449 Chronic obstructive pulmonary disease, unspecified: Secondary | ICD-10-CM | POA: Diagnosis not present

## 2014-09-10 DIAGNOSIS — G4733 Obstructive sleep apnea (adult) (pediatric): Secondary | ICD-10-CM | POA: Diagnosis not present

## 2014-09-10 DIAGNOSIS — R7301 Impaired fasting glucose: Secondary | ICD-10-CM | POA: Diagnosis not present

## 2014-09-10 DIAGNOSIS — M4806 Spinal stenosis, lumbar region: Secondary | ICD-10-CM | POA: Diagnosis not present

## 2014-09-10 DIAGNOSIS — Z23 Encounter for immunization: Secondary | ICD-10-CM | POA: Diagnosis not present

## 2014-09-10 DIAGNOSIS — F17210 Nicotine dependence, cigarettes, uncomplicated: Secondary | ICD-10-CM | POA: Diagnosis not present

## 2014-09-15 DIAGNOSIS — Z86010 Personal history of colonic polyps: Secondary | ICD-10-CM | POA: Diagnosis not present

## 2014-09-15 DIAGNOSIS — K5730 Diverticulosis of large intestine without perforation or abscess without bleeding: Secondary | ICD-10-CM | POA: Diagnosis not present

## 2014-09-15 DIAGNOSIS — Z1211 Encounter for screening for malignant neoplasm of colon: Secondary | ICD-10-CM | POA: Diagnosis not present

## 2014-09-15 DIAGNOSIS — K648 Other hemorrhoids: Secondary | ICD-10-CM | POA: Diagnosis not present

## 2014-09-15 LAB — HM COLONOSCOPY

## 2014-09-22 DIAGNOSIS — G894 Chronic pain syndrome: Secondary | ICD-10-CM | POA: Diagnosis not present

## 2014-09-22 DIAGNOSIS — Z6841 Body Mass Index (BMI) 40.0 and over, adult: Secondary | ICD-10-CM | POA: Diagnosis not present

## 2014-09-22 DIAGNOSIS — M545 Low back pain: Secondary | ICD-10-CM | POA: Diagnosis not present

## 2014-12-17 DIAGNOSIS — E782 Mixed hyperlipidemia: Secondary | ICD-10-CM | POA: Diagnosis not present

## 2014-12-17 DIAGNOSIS — M4806 Spinal stenosis, lumbar region: Secondary | ICD-10-CM | POA: Diagnosis not present

## 2014-12-17 DIAGNOSIS — G2581 Restless legs syndrome: Secondary | ICD-10-CM | POA: Diagnosis not present

## 2014-12-17 DIAGNOSIS — C4492 Squamous cell carcinoma of skin, unspecified: Secondary | ICD-10-CM | POA: Diagnosis not present

## 2014-12-17 DIAGNOSIS — M25511 Pain in right shoulder: Secondary | ICD-10-CM | POA: Diagnosis not present

## 2014-12-17 DIAGNOSIS — R7301 Impaired fasting glucose: Secondary | ICD-10-CM | POA: Diagnosis not present

## 2014-12-17 DIAGNOSIS — M25512 Pain in left shoulder: Secondary | ICD-10-CM | POA: Diagnosis not present

## 2014-12-17 DIAGNOSIS — F17210 Nicotine dependence, cigarettes, uncomplicated: Secondary | ICD-10-CM | POA: Diagnosis not present

## 2014-12-17 DIAGNOSIS — J449 Chronic obstructive pulmonary disease, unspecified: Secondary | ICD-10-CM | POA: Diagnosis not present

## 2014-12-17 DIAGNOSIS — E780 Pure hypercholesterolemia: Secondary | ICD-10-CM | POA: Diagnosis not present

## 2014-12-17 DIAGNOSIS — Z5181 Encounter for therapeutic drug level monitoring: Secondary | ICD-10-CM | POA: Diagnosis not present

## 2014-12-17 DIAGNOSIS — I10 Essential (primary) hypertension: Secondary | ICD-10-CM | POA: Diagnosis not present

## 2014-12-17 DIAGNOSIS — G4733 Obstructive sleep apnea (adult) (pediatric): Secondary | ICD-10-CM | POA: Diagnosis not present

## 2014-12-17 DIAGNOSIS — Z79899 Other long term (current) drug therapy: Secondary | ICD-10-CM | POA: Diagnosis not present

## 2015-01-05 DIAGNOSIS — L578 Other skin changes due to chronic exposure to nonionizing radiation: Secondary | ICD-10-CM | POA: Diagnosis not present

## 2015-01-05 DIAGNOSIS — L820 Inflamed seborrheic keratosis: Secondary | ICD-10-CM | POA: Diagnosis not present

## 2015-01-05 DIAGNOSIS — L570 Actinic keratosis: Secondary | ICD-10-CM | POA: Diagnosis not present

## 2015-01-05 DIAGNOSIS — L821 Other seborrheic keratosis: Secondary | ICD-10-CM | POA: Diagnosis not present

## 2015-03-25 DIAGNOSIS — G4733 Obstructive sleep apnea (adult) (pediatric): Secondary | ICD-10-CM | POA: Diagnosis not present

## 2015-03-25 DIAGNOSIS — R7301 Impaired fasting glucose: Secondary | ICD-10-CM | POA: Diagnosis not present

## 2015-03-25 DIAGNOSIS — G2581 Restless legs syndrome: Secondary | ICD-10-CM | POA: Diagnosis not present

## 2015-03-25 DIAGNOSIS — E782 Mixed hyperlipidemia: Secondary | ICD-10-CM | POA: Diagnosis not present

## 2015-03-25 DIAGNOSIS — J449 Chronic obstructive pulmonary disease, unspecified: Secondary | ICD-10-CM | POA: Diagnosis not present

## 2015-03-25 DIAGNOSIS — I10 Essential (primary) hypertension: Secondary | ICD-10-CM | POA: Diagnosis not present

## 2015-03-25 DIAGNOSIS — F17210 Nicotine dependence, cigarettes, uncomplicated: Secondary | ICD-10-CM | POA: Diagnosis not present

## 2015-03-25 DIAGNOSIS — M4806 Spinal stenosis, lumbar region: Secondary | ICD-10-CM | POA: Diagnosis not present

## 2015-04-11 DIAGNOSIS — M25562 Pain in left knee: Secondary | ICD-10-CM | POA: Diagnosis not present

## 2015-04-25 DIAGNOSIS — Z79899 Other long term (current) drug therapy: Secondary | ICD-10-CM | POA: Diagnosis not present

## 2015-04-25 DIAGNOSIS — H35342 Macular cyst, hole, or pseudohole, left eye: Secondary | ICD-10-CM | POA: Diagnosis not present

## 2015-04-25 DIAGNOSIS — H35361 Drusen (degenerative) of macula, right eye: Secondary | ICD-10-CM | POA: Diagnosis not present

## 2015-06-01 DIAGNOSIS — H35342 Macular cyst, hole, or pseudohole, left eye: Secondary | ICD-10-CM | POA: Diagnosis not present

## 2015-06-01 DIAGNOSIS — H43812 Vitreous degeneration, left eye: Secondary | ICD-10-CM | POA: Diagnosis not present

## 2015-06-01 DIAGNOSIS — H35372 Puckering of macula, left eye: Secondary | ICD-10-CM | POA: Diagnosis not present

## 2015-06-01 DIAGNOSIS — H43821 Vitreomacular adhesion, right eye: Secondary | ICD-10-CM | POA: Diagnosis not present

## 2015-07-04 DIAGNOSIS — M4806 Spinal stenosis, lumbar region: Secondary | ICD-10-CM | POA: Diagnosis not present

## 2015-07-04 DIAGNOSIS — R7301 Impaired fasting glucose: Secondary | ICD-10-CM | POA: Diagnosis not present

## 2015-07-04 DIAGNOSIS — E782 Mixed hyperlipidemia: Secondary | ICD-10-CM | POA: Diagnosis not present

## 2015-07-04 DIAGNOSIS — F17210 Nicotine dependence, cigarettes, uncomplicated: Secondary | ICD-10-CM | POA: Diagnosis not present

## 2015-07-04 DIAGNOSIS — G2581 Restless legs syndrome: Secondary | ICD-10-CM | POA: Diagnosis not present

## 2015-07-04 DIAGNOSIS — G4733 Obstructive sleep apnea (adult) (pediatric): Secondary | ICD-10-CM | POA: Diagnosis not present

## 2015-07-04 DIAGNOSIS — J449 Chronic obstructive pulmonary disease, unspecified: Secondary | ICD-10-CM | POA: Diagnosis not present

## 2015-07-04 DIAGNOSIS — I10 Essential (primary) hypertension: Secondary | ICD-10-CM | POA: Diagnosis not present

## 2015-07-04 DIAGNOSIS — E780 Pure hypercholesterolemia: Secondary | ICD-10-CM | POA: Diagnosis not present

## 2015-07-12 DIAGNOSIS — M25551 Pain in right hip: Secondary | ICD-10-CM | POA: Diagnosis not present

## 2015-07-12 DIAGNOSIS — M1611 Unilateral primary osteoarthritis, right hip: Secondary | ICD-10-CM | POA: Diagnosis not present

## 2015-07-12 DIAGNOSIS — M069 Rheumatoid arthritis, unspecified: Secondary | ICD-10-CM | POA: Diagnosis not present

## 2015-07-12 DIAGNOSIS — M19041 Primary osteoarthritis, right hand: Secondary | ICD-10-CM | POA: Diagnosis not present

## 2015-07-12 DIAGNOSIS — M19042 Primary osteoarthritis, left hand: Secondary | ICD-10-CM | POA: Diagnosis not present

## 2015-07-28 DIAGNOSIS — I674 Hypertensive encephalopathy: Secondary | ICD-10-CM | POA: Diagnosis not present

## 2015-07-28 DIAGNOSIS — Z79899 Other long term (current) drug therapy: Secondary | ICD-10-CM | POA: Diagnosis not present

## 2015-07-28 DIAGNOSIS — F17210 Nicotine dependence, cigarettes, uncomplicated: Secondary | ICD-10-CM | POA: Diagnosis present

## 2015-07-28 DIAGNOSIS — A419 Sepsis, unspecified organism: Secondary | ICD-10-CM | POA: Diagnosis not present

## 2015-07-28 DIAGNOSIS — R4182 Altered mental status, unspecified: Secondary | ICD-10-CM | POA: Diagnosis not present

## 2015-07-28 DIAGNOSIS — S299XXA Unspecified injury of thorax, initial encounter: Secondary | ICD-10-CM | POA: Diagnosis not present

## 2015-07-28 DIAGNOSIS — R52 Pain, unspecified: Secondary | ICD-10-CM | POA: Diagnosis not present

## 2015-07-28 DIAGNOSIS — R319 Hematuria, unspecified: Secondary | ICD-10-CM | POA: Diagnosis present

## 2015-07-28 DIAGNOSIS — F17290 Nicotine dependence, other tobacco product, uncomplicated: Secondary | ICD-10-CM | POA: Diagnosis present

## 2015-07-28 DIAGNOSIS — I509 Heart failure, unspecified: Secondary | ICD-10-CM | POA: Diagnosis present

## 2015-07-28 DIAGNOSIS — M545 Low back pain: Secondary | ICD-10-CM | POA: Diagnosis not present

## 2015-07-28 DIAGNOSIS — J449 Chronic obstructive pulmonary disease, unspecified: Secondary | ICD-10-CM | POA: Diagnosis not present

## 2015-07-28 DIAGNOSIS — R410 Disorientation, unspecified: Secondary | ICD-10-CM | POA: Diagnosis not present

## 2015-07-28 DIAGNOSIS — G92 Toxic encephalopathy: Secondary | ICD-10-CM | POA: Diagnosis not present

## 2015-07-28 DIAGNOSIS — N3000 Acute cystitis without hematuria: Secondary | ICD-10-CM | POA: Diagnosis not present

## 2015-07-28 DIAGNOSIS — S3991XA Unspecified injury of abdomen, initial encounter: Secondary | ICD-10-CM | POA: Diagnosis not present

## 2015-07-28 DIAGNOSIS — R55 Syncope and collapse: Secondary | ICD-10-CM | POA: Diagnosis not present

## 2015-07-28 DIAGNOSIS — B9620 Unspecified Escherichia coli [E. coli] as the cause of diseases classified elsewhere: Secondary | ICD-10-CM | POA: Diagnosis not present

## 2015-07-28 DIAGNOSIS — N390 Urinary tract infection, site not specified: Secondary | ICD-10-CM | POA: Diagnosis not present

## 2015-07-28 DIAGNOSIS — I10 Essential (primary) hypertension: Secondary | ICD-10-CM | POA: Diagnosis not present

## 2015-08-04 DIAGNOSIS — I10 Essential (primary) hypertension: Secondary | ICD-10-CM | POA: Diagnosis not present

## 2015-08-04 DIAGNOSIS — G92 Toxic encephalopathy: Secondary | ICD-10-CM | POA: Diagnosis not present

## 2015-08-04 DIAGNOSIS — N390 Urinary tract infection, site not specified: Secondary | ICD-10-CM | POA: Diagnosis not present

## 2015-09-01 DIAGNOSIS — H35342 Macular cyst, hole, or pseudohole, left eye: Secondary | ICD-10-CM | POA: Diagnosis not present

## 2015-09-02 DIAGNOSIS — H35342 Macular cyst, hole, or pseudohole, left eye: Secondary | ICD-10-CM | POA: Diagnosis not present

## 2015-09-12 DIAGNOSIS — H35342 Macular cyst, hole, or pseudohole, left eye: Secondary | ICD-10-CM | POA: Diagnosis not present

## 2015-09-12 DIAGNOSIS — H35372 Puckering of macula, left eye: Secondary | ICD-10-CM | POA: Diagnosis not present

## 2015-10-04 DIAGNOSIS — H35342 Macular cyst, hole, or pseudohole, left eye: Secondary | ICD-10-CM | POA: Diagnosis not present

## 2015-10-04 DIAGNOSIS — H33312 Horseshoe tear of retina without detachment, left eye: Secondary | ICD-10-CM | POA: Diagnosis not present

## 2015-10-17 DIAGNOSIS — E782 Mixed hyperlipidemia: Secondary | ICD-10-CM | POA: Diagnosis not present

## 2015-10-17 DIAGNOSIS — R7301 Impaired fasting glucose: Secondary | ICD-10-CM | POA: Diagnosis not present

## 2015-10-17 DIAGNOSIS — I10 Essential (primary) hypertension: Secondary | ICD-10-CM | POA: Diagnosis not present

## 2015-12-29 DIAGNOSIS — H35342 Macular cyst, hole, or pseudohole, left eye: Secondary | ICD-10-CM | POA: Diagnosis not present

## 2016-01-12 DIAGNOSIS — H35342 Macular cyst, hole, or pseudohole, left eye: Secondary | ICD-10-CM | POA: Diagnosis not present

## 2016-01-17 DIAGNOSIS — M25552 Pain in left hip: Secondary | ICD-10-CM | POA: Diagnosis not present

## 2016-01-17 DIAGNOSIS — I10 Essential (primary) hypertension: Secondary | ICD-10-CM | POA: Diagnosis not present

## 2016-01-17 DIAGNOSIS — R7301 Impaired fasting glucose: Secondary | ICD-10-CM | POA: Diagnosis not present

## 2016-01-17 DIAGNOSIS — M25551 Pain in right hip: Secondary | ICD-10-CM | POA: Diagnosis not present

## 2016-01-17 DIAGNOSIS — M4806 Spinal stenosis, lumbar region: Secondary | ICD-10-CM | POA: Diagnosis not present

## 2016-01-17 DIAGNOSIS — M069 Rheumatoid arthritis, unspecified: Secondary | ICD-10-CM | POA: Diagnosis not present

## 2016-01-17 DIAGNOSIS — E782 Mixed hyperlipidemia: Secondary | ICD-10-CM | POA: Diagnosis not present

## 2016-01-17 DIAGNOSIS — J410 Simple chronic bronchitis: Secondary | ICD-10-CM | POA: Diagnosis not present

## 2016-02-17 DIAGNOSIS — H35342 Macular cyst, hole, or pseudohole, left eye: Secondary | ICD-10-CM | POA: Diagnosis not present

## 2016-04-10 DIAGNOSIS — H35342 Macular cyst, hole, or pseudohole, left eye: Secondary | ICD-10-CM | POA: Diagnosis not present

## 2016-04-17 DIAGNOSIS — J410 Simple chronic bronchitis: Secondary | ICD-10-CM | POA: Diagnosis not present

## 2016-04-17 DIAGNOSIS — I110 Hypertensive heart disease with heart failure: Secondary | ICD-10-CM | POA: Diagnosis not present

## 2016-04-17 DIAGNOSIS — M25551 Pain in right hip: Secondary | ICD-10-CM | POA: Diagnosis not present

## 2016-04-17 DIAGNOSIS — R7301 Impaired fasting glucose: Secondary | ICD-10-CM | POA: Diagnosis not present

## 2016-04-17 DIAGNOSIS — F5101 Primary insomnia: Secondary | ICD-10-CM | POA: Diagnosis not present

## 2016-04-17 DIAGNOSIS — M05841 Other rheumatoid arthritis with rheumatoid factor of right hand: Secondary | ICD-10-CM | POA: Diagnosis not present

## 2016-04-17 DIAGNOSIS — E782 Mixed hyperlipidemia: Secondary | ICD-10-CM | POA: Diagnosis not present

## 2016-04-17 DIAGNOSIS — M4806 Spinal stenosis, lumbar region: Secondary | ICD-10-CM | POA: Diagnosis not present

## 2016-04-17 DIAGNOSIS — M25552 Pain in left hip: Secondary | ICD-10-CM | POA: Diagnosis not present

## 2016-04-17 DIAGNOSIS — M05842 Other rheumatoid arthritis with rheumatoid factor of left hand: Secondary | ICD-10-CM | POA: Diagnosis not present

## 2016-04-17 DIAGNOSIS — M170 Bilateral primary osteoarthritis of knee: Secondary | ICD-10-CM | POA: Diagnosis not present

## 2016-07-01 DIAGNOSIS — D72829 Elevated white blood cell count, unspecified: Secondary | ICD-10-CM | POA: Diagnosis not present

## 2016-07-01 DIAGNOSIS — I10 Essential (primary) hypertension: Secondary | ICD-10-CM | POA: Diagnosis not present

## 2016-07-01 DIAGNOSIS — R05 Cough: Secondary | ICD-10-CM | POA: Diagnosis not present

## 2016-07-01 DIAGNOSIS — M47814 Spondylosis without myelopathy or radiculopathy, thoracic region: Secondary | ICD-10-CM | POA: Diagnosis present

## 2016-07-01 DIAGNOSIS — M069 Rheumatoid arthritis, unspecified: Secondary | ICD-10-CM | POA: Diagnosis not present

## 2016-07-01 DIAGNOSIS — F0151 Vascular dementia with behavioral disturbance: Secondary | ICD-10-CM | POA: Diagnosis not present

## 2016-07-01 DIAGNOSIS — M25562 Pain in left knee: Secondary | ICD-10-CM | POA: Diagnosis not present

## 2016-07-01 DIAGNOSIS — J449 Chronic obstructive pulmonary disease, unspecified: Secondary | ICD-10-CM | POA: Diagnosis not present

## 2016-07-01 DIAGNOSIS — Z79891 Long term (current) use of opiate analgesic: Secondary | ICD-10-CM | POA: Diagnosis not present

## 2016-07-01 DIAGNOSIS — F29 Unspecified psychosis not due to a substance or known physiological condition: Secondary | ICD-10-CM | POA: Diagnosis not present

## 2016-07-01 DIAGNOSIS — M10062 Idiopathic gout, left knee: Secondary | ICD-10-CM | POA: Diagnosis present

## 2016-07-01 DIAGNOSIS — G2581 Restless legs syndrome: Secondary | ICD-10-CM | POA: Diagnosis present

## 2016-07-01 DIAGNOSIS — M7989 Other specified soft tissue disorders: Secondary | ICD-10-CM | POA: Diagnosis not present

## 2016-07-01 DIAGNOSIS — Z880 Allergy status to penicillin: Secondary | ICD-10-CM | POA: Diagnosis not present

## 2016-07-01 DIAGNOSIS — M25462 Effusion, left knee: Secondary | ICD-10-CM | POA: Diagnosis not present

## 2016-07-01 DIAGNOSIS — F17210 Nicotine dependence, cigarettes, uncomplicated: Secondary | ICD-10-CM | POA: Diagnosis present

## 2016-07-01 DIAGNOSIS — Z6841 Body Mass Index (BMI) 40.0 and over, adult: Secondary | ICD-10-CM | POA: Diagnosis not present

## 2016-07-01 DIAGNOSIS — E669 Obesity, unspecified: Secondary | ICD-10-CM | POA: Diagnosis present

## 2016-07-01 DIAGNOSIS — M109 Gout, unspecified: Secondary | ICD-10-CM | POA: Insufficient documentation

## 2016-07-01 DIAGNOSIS — R4182 Altered mental status, unspecified: Secondary | ICD-10-CM | POA: Diagnosis not present

## 2016-07-01 DIAGNOSIS — F22 Delusional disorders: Secondary | ICD-10-CM | POA: Insufficient documentation

## 2016-07-01 DIAGNOSIS — R441 Visual hallucinations: Secondary | ICD-10-CM | POA: Diagnosis present

## 2016-07-01 HISTORY — DX: Delusional disorders: F22

## 2016-07-01 HISTORY — DX: Gout, unspecified: M10.9

## 2016-07-01 HISTORY — DX: Unspecified psychosis not due to a substance or known physiological condition: F29

## 2016-07-03 DIAGNOSIS — I10 Essential (primary) hypertension: Secondary | ICD-10-CM | POA: Insufficient documentation

## 2016-07-03 DIAGNOSIS — G2581 Restless legs syndrome: Secondary | ICD-10-CM | POA: Insufficient documentation

## 2016-07-03 HISTORY — DX: Essential (primary) hypertension: I10

## 2016-07-03 HISTORY — DX: Restless legs syndrome: G25.81

## 2016-07-06 DIAGNOSIS — F01518 Vascular dementia, unspecified severity, with other behavioral disturbance: Secondary | ICD-10-CM

## 2016-07-06 DIAGNOSIS — F0151 Vascular dementia with behavioral disturbance: Secondary | ICD-10-CM

## 2016-07-06 HISTORY — DX: Vascular dementia with behavioral disturbance: F01.51

## 2016-07-06 HISTORY — DX: Vascular dementia, unspecified severity, with other behavioral disturbance: F01.518

## 2016-07-18 DIAGNOSIS — M1009 Idiopathic gout, multiple sites: Secondary | ICD-10-CM | POA: Diagnosis not present

## 2016-07-18 DIAGNOSIS — H5316 Psychophysical visual disturbances: Secondary | ICD-10-CM | POA: Diagnosis not present

## 2016-07-18 DIAGNOSIS — R413 Other amnesia: Secondary | ICD-10-CM | POA: Diagnosis not present

## 2016-07-18 DIAGNOSIS — I10 Essential (primary) hypertension: Secondary | ICD-10-CM | POA: Diagnosis not present

## 2016-07-18 DIAGNOSIS — G2581 Restless legs syndrome: Secondary | ICD-10-CM | POA: Diagnosis not present

## 2016-07-18 DIAGNOSIS — R4189 Other symptoms and signs involving cognitive functions and awareness: Secondary | ICD-10-CM | POA: Diagnosis not present

## 2016-10-22 DIAGNOSIS — J441 Chronic obstructive pulmonary disease with (acute) exacerbation: Secondary | ICD-10-CM | POA: Diagnosis not present

## 2016-10-25 DIAGNOSIS — I10 Essential (primary) hypertension: Secondary | ICD-10-CM | POA: Diagnosis not present

## 2016-10-25 DIAGNOSIS — R7301 Impaired fasting glucose: Secondary | ICD-10-CM | POA: Diagnosis not present

## 2016-10-25 DIAGNOSIS — E782 Mixed hyperlipidemia: Secondary | ICD-10-CM | POA: Diagnosis not present

## 2016-12-11 DIAGNOSIS — M25511 Pain in right shoulder: Secondary | ICD-10-CM | POA: Diagnosis not present

## 2016-12-11 DIAGNOSIS — M5412 Radiculopathy, cervical region: Secondary | ICD-10-CM | POA: Diagnosis not present

## 2017-01-09 DIAGNOSIS — F17200 Nicotine dependence, unspecified, uncomplicated: Secondary | ICD-10-CM | POA: Diagnosis not present

## 2017-01-09 DIAGNOSIS — Z0001 Encounter for general adult medical examination with abnormal findings: Secondary | ICD-10-CM | POA: Diagnosis not present

## 2017-01-09 DIAGNOSIS — M170 Bilateral primary osteoarthritis of knee: Secondary | ICD-10-CM | POA: Diagnosis not present

## 2017-01-09 DIAGNOSIS — Z6841 Body Mass Index (BMI) 40.0 and over, adult: Secondary | ICD-10-CM | POA: Diagnosis not present

## 2017-01-09 DIAGNOSIS — F17210 Nicotine dependence, cigarettes, uncomplicated: Secondary | ICD-10-CM | POA: Diagnosis not present

## 2017-01-09 DIAGNOSIS — I10 Essential (primary) hypertension: Secondary | ICD-10-CM | POA: Diagnosis not present

## 2017-01-09 DIAGNOSIS — Z23 Encounter for immunization: Secondary | ICD-10-CM | POA: Diagnosis not present

## 2017-02-08 DIAGNOSIS — M05841 Other rheumatoid arthritis with rheumatoid factor of right hand: Secondary | ICD-10-CM | POA: Diagnosis not present

## 2017-02-08 DIAGNOSIS — M170 Bilateral primary osteoarthritis of knee: Secondary | ICD-10-CM | POA: Diagnosis not present

## 2017-02-08 DIAGNOSIS — I7389 Other specified peripheral vascular diseases: Secondary | ICD-10-CM | POA: Diagnosis not present

## 2017-02-08 DIAGNOSIS — M05842 Other rheumatoid arthritis with rheumatoid factor of left hand: Secondary | ICD-10-CM | POA: Diagnosis not present

## 2017-02-08 DIAGNOSIS — N401 Enlarged prostate with lower urinary tract symptoms: Secondary | ICD-10-CM | POA: Diagnosis not present

## 2017-02-08 DIAGNOSIS — M25551 Pain in right hip: Secondary | ICD-10-CM | POA: Diagnosis not present

## 2017-02-08 DIAGNOSIS — E782 Mixed hyperlipidemia: Secondary | ICD-10-CM | POA: Diagnosis not present

## 2017-02-08 DIAGNOSIS — M25552 Pain in left hip: Secondary | ICD-10-CM | POA: Diagnosis not present

## 2017-02-08 DIAGNOSIS — J410 Simple chronic bronchitis: Secondary | ICD-10-CM | POA: Diagnosis not present

## 2017-02-08 DIAGNOSIS — I110 Hypertensive heart disease with heart failure: Secondary | ICD-10-CM | POA: Diagnosis not present

## 2017-02-08 DIAGNOSIS — I10 Essential (primary) hypertension: Secondary | ICD-10-CM | POA: Diagnosis not present

## 2017-02-08 DIAGNOSIS — R7301 Impaired fasting glucose: Secondary | ICD-10-CM | POA: Diagnosis not present

## 2017-02-08 DIAGNOSIS — M48061 Spinal stenosis, lumbar region without neurogenic claudication: Secondary | ICD-10-CM | POA: Diagnosis not present

## 2017-02-13 DIAGNOSIS — M25461 Effusion, right knee: Secondary | ICD-10-CM | POA: Diagnosis not present

## 2017-02-13 DIAGNOSIS — M1611 Unilateral primary osteoarthritis, right hip: Secondary | ICD-10-CM | POA: Diagnosis not present

## 2017-03-11 DIAGNOSIS — M25511 Pain in right shoulder: Secondary | ICD-10-CM | POA: Diagnosis not present

## 2017-03-11 DIAGNOSIS — M25512 Pain in left shoulder: Secondary | ICD-10-CM | POA: Diagnosis not present

## 2017-03-11 DIAGNOSIS — M545 Low back pain: Secondary | ICD-10-CM | POA: Diagnosis not present

## 2017-03-11 DIAGNOSIS — N3941 Urge incontinence: Secondary | ICD-10-CM | POA: Diagnosis not present

## 2017-05-14 DIAGNOSIS — M48061 Spinal stenosis, lumbar region without neurogenic claudication: Secondary | ICD-10-CM | POA: Diagnosis not present

## 2017-05-14 DIAGNOSIS — I110 Hypertensive heart disease with heart failure: Secondary | ICD-10-CM | POA: Diagnosis not present

## 2017-05-14 DIAGNOSIS — G2581 Restless legs syndrome: Secondary | ICD-10-CM | POA: Diagnosis not present

## 2017-05-14 DIAGNOSIS — R7301 Impaired fasting glucose: Secondary | ICD-10-CM | POA: Diagnosis not present

## 2017-05-14 DIAGNOSIS — E782 Mixed hyperlipidemia: Secondary | ICD-10-CM | POA: Diagnosis not present

## 2017-05-14 DIAGNOSIS — I10 Essential (primary) hypertension: Secondary | ICD-10-CM | POA: Diagnosis not present

## 2017-05-14 DIAGNOSIS — Z6841 Body Mass Index (BMI) 40.0 and over, adult: Secondary | ICD-10-CM | POA: Diagnosis not present

## 2017-06-10 ENCOUNTER — Other Ambulatory Visit: Payer: Self-pay

## 2017-08-26 DIAGNOSIS — R7301 Impaired fasting glucose: Secondary | ICD-10-CM | POA: Diagnosis not present

## 2017-08-26 DIAGNOSIS — I110 Hypertensive heart disease with heart failure: Secondary | ICD-10-CM | POA: Diagnosis not present

## 2017-08-26 DIAGNOSIS — M48061 Spinal stenosis, lumbar region without neurogenic claudication: Secondary | ICD-10-CM | POA: Diagnosis not present

## 2017-08-26 DIAGNOSIS — J410 Simple chronic bronchitis: Secondary | ICD-10-CM | POA: Diagnosis not present

## 2017-08-26 DIAGNOSIS — G894 Chronic pain syndrome: Secondary | ICD-10-CM | POA: Diagnosis not present

## 2017-08-26 DIAGNOSIS — I10 Essential (primary) hypertension: Secondary | ICD-10-CM | POA: Diagnosis not present

## 2017-08-26 DIAGNOSIS — E782 Mixed hyperlipidemia: Secondary | ICD-10-CM | POA: Diagnosis not present

## 2017-08-26 DIAGNOSIS — Z23 Encounter for immunization: Secondary | ICD-10-CM | POA: Diagnosis not present

## 2017-10-09 DIAGNOSIS — M48061 Spinal stenosis, lumbar region without neurogenic claudication: Secondary | ICD-10-CM | POA: Diagnosis not present

## 2017-10-09 DIAGNOSIS — J441 Chronic obstructive pulmonary disease with (acute) exacerbation: Secondary | ICD-10-CM | POA: Diagnosis not present

## 2017-10-09 DIAGNOSIS — I10 Essential (primary) hypertension: Secondary | ICD-10-CM | POA: Diagnosis not present

## 2017-10-16 DIAGNOSIS — J441 Chronic obstructive pulmonary disease with (acute) exacerbation: Secondary | ICD-10-CM | POA: Diagnosis not present

## 2017-10-16 DIAGNOSIS — I10 Essential (primary) hypertension: Secondary | ICD-10-CM | POA: Diagnosis not present

## 2017-12-06 DIAGNOSIS — I110 Hypertensive heart disease with heart failure: Secondary | ICD-10-CM | POA: Diagnosis not present

## 2017-12-06 DIAGNOSIS — J410 Simple chronic bronchitis: Secondary | ICD-10-CM | POA: Diagnosis not present

## 2017-12-06 DIAGNOSIS — R7301 Impaired fasting glucose: Secondary | ICD-10-CM | POA: Diagnosis not present

## 2017-12-06 DIAGNOSIS — G894 Chronic pain syndrome: Secondary | ICD-10-CM | POA: Diagnosis not present

## 2017-12-06 DIAGNOSIS — M0589 Other rheumatoid arthritis with rheumatoid factor of multiple sites: Secondary | ICD-10-CM | POA: Diagnosis not present

## 2017-12-06 DIAGNOSIS — M25541 Pain in joints of right hand: Secondary | ICD-10-CM | POA: Diagnosis not present

## 2017-12-11 DIAGNOSIS — I110 Hypertensive heart disease with heart failure: Secondary | ICD-10-CM | POA: Diagnosis not present

## 2017-12-11 DIAGNOSIS — M25541 Pain in joints of right hand: Secondary | ICD-10-CM | POA: Diagnosis not present

## 2017-12-16 DIAGNOSIS — M79641 Pain in right hand: Secondary | ICD-10-CM | POA: Diagnosis not present

## 2017-12-16 DIAGNOSIS — M25541 Pain in joints of right hand: Secondary | ICD-10-CM | POA: Diagnosis not present

## 2017-12-18 DIAGNOSIS — M7918 Myalgia, other site: Secondary | ICD-10-CM | POA: Diagnosis not present

## 2017-12-18 DIAGNOSIS — I110 Hypertensive heart disease with heart failure: Secondary | ICD-10-CM | POA: Diagnosis not present

## 2017-12-18 DIAGNOSIS — M25541 Pain in joints of right hand: Secondary | ICD-10-CM | POA: Diagnosis not present

## 2018-01-16 DIAGNOSIS — I110 Hypertensive heart disease with heart failure: Secondary | ICD-10-CM | POA: Diagnosis not present

## 2018-01-18 DIAGNOSIS — S81812A Laceration without foreign body, left lower leg, initial encounter: Secondary | ICD-10-CM | POA: Diagnosis not present

## 2018-02-12 DIAGNOSIS — J441 Chronic obstructive pulmonary disease with (acute) exacerbation: Secondary | ICD-10-CM | POA: Diagnosis not present

## 2018-02-12 DIAGNOSIS — H6122 Impacted cerumen, left ear: Secondary | ICD-10-CM | POA: Diagnosis not present

## 2018-02-12 DIAGNOSIS — S81812D Laceration without foreign body, left lower leg, subsequent encounter: Secondary | ICD-10-CM | POA: Diagnosis not present

## 2018-02-13 DIAGNOSIS — E785 Hyperlipidemia, unspecified: Secondary | ICD-10-CM | POA: Diagnosis not present

## 2018-02-13 DIAGNOSIS — F17200 Nicotine dependence, unspecified, uncomplicated: Secondary | ICD-10-CM | POA: Diagnosis not present

## 2018-02-13 DIAGNOSIS — L97822 Non-pressure chronic ulcer of other part of left lower leg with fat layer exposed: Secondary | ICD-10-CM | POA: Diagnosis not present

## 2018-02-13 DIAGNOSIS — J449 Chronic obstructive pulmonary disease, unspecified: Secondary | ICD-10-CM | POA: Diagnosis not present

## 2018-02-13 DIAGNOSIS — S81802A Unspecified open wound, left lower leg, initial encounter: Secondary | ICD-10-CM | POA: Diagnosis not present

## 2018-02-13 DIAGNOSIS — I10 Essential (primary) hypertension: Secondary | ICD-10-CM | POA: Diagnosis not present

## 2018-02-19 DIAGNOSIS — I10 Essential (primary) hypertension: Secondary | ICD-10-CM | POA: Diagnosis not present

## 2018-02-20 DIAGNOSIS — I5040 Unspecified combined systolic (congestive) and diastolic (congestive) heart failure: Secondary | ICD-10-CM | POA: Diagnosis not present

## 2018-02-20 DIAGNOSIS — I110 Hypertensive heart disease with heart failure: Secondary | ICD-10-CM | POA: Diagnosis not present

## 2018-02-20 DIAGNOSIS — S81802A Unspecified open wound, left lower leg, initial encounter: Secondary | ICD-10-CM | POA: Diagnosis not present

## 2018-02-20 DIAGNOSIS — F17200 Nicotine dependence, unspecified, uncomplicated: Secondary | ICD-10-CM | POA: Diagnosis not present

## 2018-03-03 DIAGNOSIS — S81802A Unspecified open wound, left lower leg, initial encounter: Secondary | ICD-10-CM | POA: Diagnosis not present

## 2018-03-03 DIAGNOSIS — S81812A Laceration without foreign body, left lower leg, initial encounter: Secondary | ICD-10-CM | POA: Diagnosis not present

## 2018-03-06 DIAGNOSIS — S81802A Unspecified open wound, left lower leg, initial encounter: Secondary | ICD-10-CM | POA: Diagnosis not present

## 2018-03-06 DIAGNOSIS — I5040 Unspecified combined systolic (congestive) and diastolic (congestive) heart failure: Secondary | ICD-10-CM | POA: Diagnosis not present

## 2018-03-06 DIAGNOSIS — Z87891 Personal history of nicotine dependence: Secondary | ICD-10-CM | POA: Diagnosis not present

## 2018-03-10 DIAGNOSIS — E782 Mixed hyperlipidemia: Secondary | ICD-10-CM | POA: Diagnosis not present

## 2018-03-10 DIAGNOSIS — I10 Essential (primary) hypertension: Secondary | ICD-10-CM | POA: Diagnosis not present

## 2018-03-10 DIAGNOSIS — J441 Chronic obstructive pulmonary disease with (acute) exacerbation: Secondary | ICD-10-CM | POA: Diagnosis not present

## 2018-03-10 DIAGNOSIS — G894 Chronic pain syndrome: Secondary | ICD-10-CM | POA: Diagnosis not present

## 2018-03-10 DIAGNOSIS — M0589 Other rheumatoid arthritis with rheumatoid factor of multiple sites: Secondary | ICD-10-CM | POA: Diagnosis not present

## 2018-03-10 DIAGNOSIS — R7301 Impaired fasting glucose: Secondary | ICD-10-CM | POA: Diagnosis not present

## 2018-03-10 DIAGNOSIS — J0101 Acute recurrent maxillary sinusitis: Secondary | ICD-10-CM | POA: Diagnosis not present

## 2018-03-10 DIAGNOSIS — I110 Hypertensive heart disease with heart failure: Secondary | ICD-10-CM | POA: Diagnosis not present

## 2018-03-11 DIAGNOSIS — I5040 Unspecified combined systolic (congestive) and diastolic (congestive) heart failure: Secondary | ICD-10-CM | POA: Diagnosis not present

## 2018-03-11 DIAGNOSIS — S81802A Unspecified open wound, left lower leg, initial encounter: Secondary | ICD-10-CM | POA: Diagnosis not present

## 2018-03-11 DIAGNOSIS — Z87891 Personal history of nicotine dependence: Secondary | ICD-10-CM | POA: Diagnosis not present

## 2018-03-14 DIAGNOSIS — I5040 Unspecified combined systolic (congestive) and diastolic (congestive) heart failure: Secondary | ICD-10-CM | POA: Diagnosis not present

## 2018-03-14 DIAGNOSIS — S81802A Unspecified open wound, left lower leg, initial encounter: Secondary | ICD-10-CM | POA: Diagnosis not present

## 2018-03-14 DIAGNOSIS — Z87891 Personal history of nicotine dependence: Secondary | ICD-10-CM | POA: Diagnosis not present

## 2018-03-18 DIAGNOSIS — S81802A Unspecified open wound, left lower leg, initial encounter: Secondary | ICD-10-CM | POA: Diagnosis not present

## 2018-03-21 DIAGNOSIS — S81802A Unspecified open wound, left lower leg, initial encounter: Secondary | ICD-10-CM | POA: Diagnosis not present

## 2018-03-21 DIAGNOSIS — Z87891 Personal history of nicotine dependence: Secondary | ICD-10-CM | POA: Diagnosis not present

## 2018-03-21 DIAGNOSIS — I5040 Unspecified combined systolic (congestive) and diastolic (congestive) heart failure: Secondary | ICD-10-CM | POA: Diagnosis not present

## 2018-03-25 DIAGNOSIS — Z87891 Personal history of nicotine dependence: Secondary | ICD-10-CM | POA: Diagnosis not present

## 2018-03-25 DIAGNOSIS — S81802A Unspecified open wound, left lower leg, initial encounter: Secondary | ICD-10-CM | POA: Diagnosis not present

## 2018-03-25 DIAGNOSIS — I5040 Unspecified combined systolic (congestive) and diastolic (congestive) heart failure: Secondary | ICD-10-CM | POA: Diagnosis not present

## 2018-03-28 DIAGNOSIS — S81802A Unspecified open wound, left lower leg, initial encounter: Secondary | ICD-10-CM | POA: Diagnosis not present

## 2018-04-01 DIAGNOSIS — S81802A Unspecified open wound, left lower leg, initial encounter: Secondary | ICD-10-CM | POA: Diagnosis not present

## 2018-04-04 DIAGNOSIS — S81802A Unspecified open wound, left lower leg, initial encounter: Secondary | ICD-10-CM | POA: Diagnosis not present

## 2018-04-04 DIAGNOSIS — Z87891 Personal history of nicotine dependence: Secondary | ICD-10-CM | POA: Diagnosis not present

## 2018-04-04 DIAGNOSIS — I5040 Unspecified combined systolic (congestive) and diastolic (congestive) heart failure: Secondary | ICD-10-CM | POA: Diagnosis not present

## 2018-04-08 DIAGNOSIS — I110 Hypertensive heart disease with heart failure: Secondary | ICD-10-CM | POA: Diagnosis not present

## 2018-04-08 DIAGNOSIS — J441 Chronic obstructive pulmonary disease with (acute) exacerbation: Secondary | ICD-10-CM | POA: Diagnosis not present

## 2018-04-08 DIAGNOSIS — S81802A Unspecified open wound, left lower leg, initial encounter: Secondary | ICD-10-CM | POA: Diagnosis not present

## 2018-04-08 DIAGNOSIS — E782 Mixed hyperlipidemia: Secondary | ICD-10-CM | POA: Diagnosis not present

## 2018-04-11 DIAGNOSIS — S81802A Unspecified open wound, left lower leg, initial encounter: Secondary | ICD-10-CM | POA: Diagnosis not present

## 2018-04-18 DIAGNOSIS — Z87891 Personal history of nicotine dependence: Secondary | ICD-10-CM | POA: Diagnosis not present

## 2018-04-18 DIAGNOSIS — I5040 Unspecified combined systolic (congestive) and diastolic (congestive) heart failure: Secondary | ICD-10-CM | POA: Diagnosis not present

## 2018-04-18 DIAGNOSIS — S81802A Unspecified open wound, left lower leg, initial encounter: Secondary | ICD-10-CM | POA: Diagnosis not present

## 2018-04-18 DIAGNOSIS — L97822 Non-pressure chronic ulcer of other part of left lower leg with fat layer exposed: Secondary | ICD-10-CM | POA: Diagnosis not present

## 2018-04-22 DIAGNOSIS — I5040 Unspecified combined systolic (congestive) and diastolic (congestive) heart failure: Secondary | ICD-10-CM | POA: Diagnosis not present

## 2018-04-22 DIAGNOSIS — Z87891 Personal history of nicotine dependence: Secondary | ICD-10-CM | POA: Diagnosis not present

## 2018-04-22 DIAGNOSIS — S81802A Unspecified open wound, left lower leg, initial encounter: Secondary | ICD-10-CM | POA: Diagnosis not present

## 2018-04-25 DIAGNOSIS — I5040 Unspecified combined systolic (congestive) and diastolic (congestive) heart failure: Secondary | ICD-10-CM | POA: Diagnosis not present

## 2018-04-25 DIAGNOSIS — S81802A Unspecified open wound, left lower leg, initial encounter: Secondary | ICD-10-CM | POA: Diagnosis not present

## 2018-04-25 DIAGNOSIS — Z87891 Personal history of nicotine dependence: Secondary | ICD-10-CM | POA: Diagnosis not present

## 2018-04-29 DIAGNOSIS — Z87891 Personal history of nicotine dependence: Secondary | ICD-10-CM | POA: Diagnosis not present

## 2018-04-29 DIAGNOSIS — S81802A Unspecified open wound, left lower leg, initial encounter: Secondary | ICD-10-CM | POA: Diagnosis not present

## 2018-04-29 DIAGNOSIS — I5040 Unspecified combined systolic (congestive) and diastolic (congestive) heart failure: Secondary | ICD-10-CM | POA: Diagnosis not present

## 2018-04-30 DIAGNOSIS — M150 Primary generalized (osteo)arthritis: Secondary | ICD-10-CM | POA: Diagnosis not present

## 2018-04-30 DIAGNOSIS — M2550 Pain in unspecified joint: Secondary | ICD-10-CM | POA: Diagnosis not present

## 2018-04-30 DIAGNOSIS — M5136 Other intervertebral disc degeneration, lumbar region: Secondary | ICD-10-CM | POA: Diagnosis not present

## 2018-04-30 DIAGNOSIS — R5383 Other fatigue: Secondary | ICD-10-CM | POA: Diagnosis not present

## 2018-04-30 DIAGNOSIS — Z6841 Body Mass Index (BMI) 40.0 and over, adult: Secondary | ICD-10-CM | POA: Diagnosis not present

## 2018-05-02 DIAGNOSIS — S81802A Unspecified open wound, left lower leg, initial encounter: Secondary | ICD-10-CM | POA: Diagnosis not present

## 2018-05-06 DIAGNOSIS — S81802A Unspecified open wound, left lower leg, initial encounter: Secondary | ICD-10-CM | POA: Diagnosis not present

## 2018-05-09 DIAGNOSIS — M2550 Pain in unspecified joint: Secondary | ICD-10-CM | POA: Diagnosis not present

## 2018-05-09 DIAGNOSIS — M1A09X0 Idiopathic chronic gout, multiple sites, without tophus (tophi): Secondary | ICD-10-CM | POA: Diagnosis not present

## 2018-05-09 DIAGNOSIS — M150 Primary generalized (osteo)arthritis: Secondary | ICD-10-CM | POA: Diagnosis not present

## 2018-05-09 DIAGNOSIS — M5136 Other intervertebral disc degeneration, lumbar region: Secondary | ICD-10-CM | POA: Diagnosis not present

## 2018-05-09 DIAGNOSIS — S81802A Unspecified open wound, left lower leg, initial encounter: Secondary | ICD-10-CM | POA: Diagnosis not present

## 2018-05-09 DIAGNOSIS — Z6841 Body Mass Index (BMI) 40.0 and over, adult: Secondary | ICD-10-CM | POA: Diagnosis not present

## 2018-05-13 DIAGNOSIS — S81802A Unspecified open wound, left lower leg, initial encounter: Secondary | ICD-10-CM | POA: Diagnosis not present

## 2018-05-19 DIAGNOSIS — J441 Chronic obstructive pulmonary disease with (acute) exacerbation: Secondary | ICD-10-CM | POA: Diagnosis not present

## 2018-05-19 DIAGNOSIS — I110 Hypertensive heart disease with heart failure: Secondary | ICD-10-CM | POA: Diagnosis not present

## 2018-05-20 DIAGNOSIS — Z87891 Personal history of nicotine dependence: Secondary | ICD-10-CM | POA: Diagnosis not present

## 2018-05-20 DIAGNOSIS — I5040 Unspecified combined systolic (congestive) and diastolic (congestive) heart failure: Secondary | ICD-10-CM | POA: Diagnosis not present

## 2018-05-20 DIAGNOSIS — S81802A Unspecified open wound, left lower leg, initial encounter: Secondary | ICD-10-CM | POA: Diagnosis not present

## 2018-05-27 DIAGNOSIS — S81802A Unspecified open wound, left lower leg, initial encounter: Secondary | ICD-10-CM | POA: Diagnosis not present

## 2018-05-30 DIAGNOSIS — S81802A Unspecified open wound, left lower leg, initial encounter: Secondary | ICD-10-CM | POA: Diagnosis not present

## 2018-05-30 DIAGNOSIS — I5040 Unspecified combined systolic (congestive) and diastolic (congestive) heart failure: Secondary | ICD-10-CM | POA: Diagnosis not present

## 2018-05-30 DIAGNOSIS — Z87891 Personal history of nicotine dependence: Secondary | ICD-10-CM | POA: Diagnosis not present

## 2018-06-03 DIAGNOSIS — S81802A Unspecified open wound, left lower leg, initial encounter: Secondary | ICD-10-CM | POA: Diagnosis not present

## 2018-06-06 DIAGNOSIS — S81802A Unspecified open wound, left lower leg, initial encounter: Secondary | ICD-10-CM | POA: Diagnosis not present

## 2018-06-06 DIAGNOSIS — L97822 Non-pressure chronic ulcer of other part of left lower leg with fat layer exposed: Secondary | ICD-10-CM | POA: Diagnosis not present

## 2018-06-06 DIAGNOSIS — Z87891 Personal history of nicotine dependence: Secondary | ICD-10-CM | POA: Diagnosis not present

## 2018-06-06 DIAGNOSIS — I5040 Unspecified combined systolic (congestive) and diastolic (congestive) heart failure: Secondary | ICD-10-CM | POA: Diagnosis not present

## 2018-06-10 DIAGNOSIS — E782 Mixed hyperlipidemia: Secondary | ICD-10-CM | POA: Diagnosis not present

## 2018-06-10 DIAGNOSIS — I110 Hypertensive heart disease with heart failure: Secondary | ICD-10-CM | POA: Diagnosis not present

## 2018-06-10 DIAGNOSIS — J410 Simple chronic bronchitis: Secondary | ICD-10-CM | POA: Diagnosis not present

## 2018-06-10 DIAGNOSIS — G894 Chronic pain syndrome: Secondary | ICD-10-CM | POA: Diagnosis not present

## 2018-06-10 DIAGNOSIS — M0589 Other rheumatoid arthritis with rheumatoid factor of multiple sites: Secondary | ICD-10-CM | POA: Diagnosis not present

## 2018-06-10 DIAGNOSIS — S81802A Unspecified open wound, left lower leg, initial encounter: Secondary | ICD-10-CM | POA: Diagnosis not present

## 2018-06-10 DIAGNOSIS — R7301 Impaired fasting glucose: Secondary | ICD-10-CM | POA: Diagnosis not present

## 2018-07-23 DIAGNOSIS — J441 Chronic obstructive pulmonary disease with (acute) exacerbation: Secondary | ICD-10-CM | POA: Diagnosis not present

## 2018-07-23 DIAGNOSIS — R600 Localized edema: Secondary | ICD-10-CM | POA: Diagnosis not present

## 2018-07-23 DIAGNOSIS — R0602 Shortness of breath: Secondary | ICD-10-CM | POA: Diagnosis not present

## 2018-07-24 DIAGNOSIS — R0602 Shortness of breath: Secondary | ICD-10-CM | POA: Diagnosis not present

## 2018-07-24 DIAGNOSIS — R600 Localized edema: Secondary | ICD-10-CM | POA: Diagnosis not present

## 2018-08-21 DIAGNOSIS — M150 Primary generalized (osteo)arthritis: Secondary | ICD-10-CM | POA: Diagnosis not present

## 2018-08-21 DIAGNOSIS — M1A09X0 Idiopathic chronic gout, multiple sites, without tophus (tophi): Secondary | ICD-10-CM | POA: Diagnosis not present

## 2018-08-21 DIAGNOSIS — M5136 Other intervertebral disc degeneration, lumbar region: Secondary | ICD-10-CM | POA: Diagnosis not present

## 2018-08-21 DIAGNOSIS — M2550 Pain in unspecified joint: Secondary | ICD-10-CM | POA: Diagnosis not present

## 2018-08-21 DIAGNOSIS — Z6841 Body Mass Index (BMI) 40.0 and over, adult: Secondary | ICD-10-CM | POA: Diagnosis not present

## 2018-09-29 DIAGNOSIS — R7301 Impaired fasting glucose: Secondary | ICD-10-CM | POA: Diagnosis not present

## 2018-09-29 DIAGNOSIS — J410 Simple chronic bronchitis: Secondary | ICD-10-CM | POA: Diagnosis not present

## 2018-09-29 DIAGNOSIS — G894 Chronic pain syndrome: Secondary | ICD-10-CM | POA: Diagnosis not present

## 2018-09-29 DIAGNOSIS — I110 Hypertensive heart disease with heart failure: Secondary | ICD-10-CM | POA: Diagnosis not present

## 2018-09-29 DIAGNOSIS — M0589 Other rheumatoid arthritis with rheumatoid factor of multiple sites: Secondary | ICD-10-CM | POA: Diagnosis not present

## 2018-09-29 DIAGNOSIS — Z23 Encounter for immunization: Secondary | ICD-10-CM | POA: Diagnosis not present

## 2018-09-29 DIAGNOSIS — E782 Mixed hyperlipidemia: Secondary | ICD-10-CM | POA: Diagnosis not present

## 2018-10-07 DIAGNOSIS — H35342 Macular cyst, hole, or pseudohole, left eye: Secondary | ICD-10-CM | POA: Diagnosis not present

## 2018-10-07 DIAGNOSIS — H5211 Myopia, right eye: Secondary | ICD-10-CM | POA: Diagnosis not present

## 2018-10-07 DIAGNOSIS — H35361 Drusen (degenerative) of macula, right eye: Secondary | ICD-10-CM | POA: Diagnosis not present

## 2018-10-07 DIAGNOSIS — H35363 Drusen (degenerative) of macula, bilateral: Secondary | ICD-10-CM | POA: Diagnosis not present

## 2018-10-13 DIAGNOSIS — G319 Degenerative disease of nervous system, unspecified: Secondary | ICD-10-CM | POA: Diagnosis not present

## 2018-10-13 DIAGNOSIS — F0390 Unspecified dementia without behavioral disturbance: Secondary | ICD-10-CM | POA: Diagnosis not present

## 2018-10-13 DIAGNOSIS — R413 Other amnesia: Secondary | ICD-10-CM | POA: Diagnosis not present

## 2018-10-20 DIAGNOSIS — J441 Chronic obstructive pulmonary disease with (acute) exacerbation: Secondary | ICD-10-CM | POA: Diagnosis not present

## 2018-10-20 DIAGNOSIS — G308 Other Alzheimer's disease: Secondary | ICD-10-CM | POA: Diagnosis not present

## 2018-11-04 DIAGNOSIS — Z0001 Encounter for general adult medical examination with abnormal findings: Secondary | ICD-10-CM | POA: Diagnosis not present

## 2018-11-04 DIAGNOSIS — Z6841 Body Mass Index (BMI) 40.0 and over, adult: Secondary | ICD-10-CM | POA: Diagnosis not present

## 2018-11-04 DIAGNOSIS — J441 Chronic obstructive pulmonary disease with (acute) exacerbation: Secondary | ICD-10-CM | POA: Diagnosis not present

## 2018-11-07 DIAGNOSIS — J449 Chronic obstructive pulmonary disease, unspecified: Secondary | ICD-10-CM | POA: Diagnosis not present

## 2018-11-07 DIAGNOSIS — J9811 Atelectasis: Secondary | ICD-10-CM | POA: Diagnosis not present

## 2018-11-07 DIAGNOSIS — E785 Hyperlipidemia, unspecified: Secondary | ICD-10-CM | POA: Diagnosis present

## 2018-11-07 DIAGNOSIS — R262 Difficulty in walking, not elsewhere classified: Secondary | ICD-10-CM | POA: Diagnosis present

## 2018-11-07 DIAGNOSIS — R269 Unspecified abnormalities of gait and mobility: Secondary | ICD-10-CM | POA: Diagnosis not present

## 2018-11-07 DIAGNOSIS — R609 Edema, unspecified: Secondary | ICD-10-CM | POA: Diagnosis not present

## 2018-11-07 DIAGNOSIS — T887XXA Unspecified adverse effect of drug or medicament, initial encounter: Secondary | ICD-10-CM | POA: Diagnosis not present

## 2018-11-07 DIAGNOSIS — T40601D Poisoning by unspecified narcotics, accidental (unintentional), subsequent encounter: Secondary | ICD-10-CM | POA: Diagnosis not present

## 2018-11-07 DIAGNOSIS — T40601A Poisoning by unspecified narcotics, accidental (unintentional), initial encounter: Secondary | ICD-10-CM | POA: Diagnosis not present

## 2018-11-07 DIAGNOSIS — D649 Anemia, unspecified: Secondary | ICD-10-CM | POA: Diagnosis present

## 2018-11-07 DIAGNOSIS — R970 Elevated carcinoembryonic antigen [CEA]: Secondary | ICD-10-CM | POA: Diagnosis present

## 2018-11-07 DIAGNOSIS — T50901A Poisoning by unspecified drugs, medicaments and biological substances, accidental (unintentional), initial encounter: Secondary | ICD-10-CM | POA: Diagnosis not present

## 2018-11-07 DIAGNOSIS — R278 Other lack of coordination: Secondary | ICD-10-CM | POA: Diagnosis not present

## 2018-11-07 DIAGNOSIS — Z7952 Long term (current) use of systemic steroids: Secondary | ICD-10-CM | POA: Diagnosis not present

## 2018-11-07 DIAGNOSIS — G8929 Other chronic pain: Secondary | ICD-10-CM | POA: Diagnosis present

## 2018-11-07 DIAGNOSIS — I110 Hypertensive heart disease with heart failure: Secondary | ICD-10-CM | POA: Diagnosis present

## 2018-11-07 DIAGNOSIS — N179 Acute kidney failure, unspecified: Secondary | ICD-10-CM | POA: Diagnosis present

## 2018-11-07 DIAGNOSIS — R0689 Other abnormalities of breathing: Secondary | ICD-10-CM | POA: Diagnosis not present

## 2018-11-07 DIAGNOSIS — R279 Unspecified lack of coordination: Secondary | ICD-10-CM | POA: Diagnosis not present

## 2018-11-07 DIAGNOSIS — F17210 Nicotine dependence, cigarettes, uncomplicated: Secondary | ICD-10-CM | POA: Diagnosis present

## 2018-11-07 DIAGNOSIS — R740 Nonspecific elevation of levels of transaminase and lactic acid dehydrogenase [LDH]: Secondary | ICD-10-CM | POA: Diagnosis present

## 2018-11-07 DIAGNOSIS — R400 Somnolence: Secondary | ICD-10-CM | POA: Diagnosis not present

## 2018-11-07 DIAGNOSIS — M069 Rheumatoid arthritis, unspecified: Secondary | ICD-10-CM | POA: Diagnosis present

## 2018-11-07 DIAGNOSIS — K219 Gastro-esophageal reflux disease without esophagitis: Secondary | ICD-10-CM | POA: Diagnosis present

## 2018-11-07 DIAGNOSIS — I10 Essential (primary) hypertension: Secondary | ICD-10-CM | POA: Diagnosis not present

## 2018-11-07 DIAGNOSIS — R404 Transient alteration of awareness: Secondary | ICD-10-CM | POA: Diagnosis not present

## 2018-11-07 DIAGNOSIS — E875 Hyperkalemia: Secondary | ICD-10-CM | POA: Diagnosis present

## 2018-11-07 DIAGNOSIS — G92 Toxic encephalopathy: Secondary | ICD-10-CM | POA: Diagnosis not present

## 2018-11-07 DIAGNOSIS — G629 Polyneuropathy, unspecified: Secondary | ICD-10-CM | POA: Diagnosis present

## 2018-11-07 DIAGNOSIS — R410 Disorientation, unspecified: Secondary | ICD-10-CM | POA: Diagnosis not present

## 2018-11-07 DIAGNOSIS — G9341 Metabolic encephalopathy: Secondary | ICD-10-CM | POA: Diagnosis not present

## 2018-11-07 DIAGNOSIS — R0902 Hypoxemia: Secondary | ICD-10-CM | POA: Diagnosis not present

## 2018-11-07 DIAGNOSIS — K8020 Calculus of gallbladder without cholecystitis without obstruction: Secondary | ICD-10-CM | POA: Diagnosis not present

## 2018-11-07 DIAGNOSIS — Z79891 Long term (current) use of opiate analgesic: Secondary | ICD-10-CM | POA: Diagnosis not present

## 2018-11-07 DIAGNOSIS — R7989 Other specified abnormal findings of blood chemistry: Secondary | ICD-10-CM | POA: Diagnosis present

## 2018-11-07 DIAGNOSIS — J9602 Acute respiratory failure with hypercapnia: Secondary | ICD-10-CM | POA: Diagnosis not present

## 2018-11-07 DIAGNOSIS — I5042 Chronic combined systolic (congestive) and diastolic (congestive) heart failure: Secondary | ICD-10-CM | POA: Diagnosis present

## 2018-11-07 DIAGNOSIS — Z741 Need for assistance with personal care: Secondary | ICD-10-CM | POA: Diagnosis not present

## 2018-11-07 DIAGNOSIS — M5489 Other dorsalgia: Secondary | ICD-10-CM | POA: Diagnosis not present

## 2018-11-07 DIAGNOSIS — Z743 Need for continuous supervision: Secondary | ICD-10-CM | POA: Diagnosis not present

## 2018-11-07 DIAGNOSIS — N281 Cyst of kidney, acquired: Secondary | ICD-10-CM | POA: Diagnosis not present

## 2018-11-07 DIAGNOSIS — I509 Heart failure, unspecified: Secondary | ICD-10-CM | POA: Diagnosis not present

## 2018-11-07 DIAGNOSIS — R945 Abnormal results of liver function studies: Secondary | ICD-10-CM | POA: Diagnosis not present

## 2018-11-07 DIAGNOSIS — J9692 Respiratory failure, unspecified with hypercapnia: Secondary | ICD-10-CM | POA: Diagnosis not present

## 2018-11-07 DIAGNOSIS — J690 Pneumonitis due to inhalation of food and vomit: Secondary | ICD-10-CM | POA: Diagnosis not present

## 2018-11-07 DIAGNOSIS — J9601 Acute respiratory failure with hypoxia: Secondary | ICD-10-CM | POA: Diagnosis present

## 2018-11-07 DIAGNOSIS — M6281 Muscle weakness (generalized): Secondary | ICD-10-CM | POA: Diagnosis not present

## 2018-11-07 DIAGNOSIS — J441 Chronic obstructive pulmonary disease with (acute) exacerbation: Secondary | ICD-10-CM | POA: Diagnosis present

## 2018-11-07 DIAGNOSIS — F329 Major depressive disorder, single episode, unspecified: Secondary | ICD-10-CM | POA: Diagnosis present

## 2018-11-07 DIAGNOSIS — A419 Sepsis, unspecified organism: Secondary | ICD-10-CM | POA: Diagnosis present

## 2018-11-08 DIAGNOSIS — T40601A Poisoning by unspecified narcotics, accidental (unintentional), initial encounter: Secondary | ICD-10-CM

## 2018-11-08 DIAGNOSIS — I509 Heart failure, unspecified: Secondary | ICD-10-CM

## 2018-11-08 DIAGNOSIS — J9692 Respiratory failure, unspecified with hypercapnia: Secondary | ICD-10-CM

## 2018-11-08 DIAGNOSIS — R410 Disorientation, unspecified: Secondary | ICD-10-CM

## 2018-11-11 DIAGNOSIS — R269 Unspecified abnormalities of gait and mobility: Secondary | ICD-10-CM | POA: Diagnosis not present

## 2018-11-11 DIAGNOSIS — M6281 Muscle weakness (generalized): Secondary | ICD-10-CM | POA: Diagnosis not present

## 2018-11-11 DIAGNOSIS — J9690 Respiratory failure, unspecified, unspecified whether with hypoxia or hypercapnia: Secondary | ICD-10-CM | POA: Diagnosis not present

## 2018-11-11 DIAGNOSIS — E119 Type 2 diabetes mellitus without complications: Secondary | ICD-10-CM | POA: Diagnosis not present

## 2018-11-11 DIAGNOSIS — G629 Polyneuropathy, unspecified: Secondary | ICD-10-CM | POA: Diagnosis not present

## 2018-11-11 DIAGNOSIS — J449 Chronic obstructive pulmonary disease, unspecified: Secondary | ICD-10-CM | POA: Diagnosis not present

## 2018-11-11 DIAGNOSIS — M069 Rheumatoid arthritis, unspecified: Secondary | ICD-10-CM | POA: Diagnosis not present

## 2018-11-11 DIAGNOSIS — R279 Unspecified lack of coordination: Secondary | ICD-10-CM | POA: Diagnosis not present

## 2018-11-11 DIAGNOSIS — T40601D Poisoning by unspecified narcotics, accidental (unintentional), subsequent encounter: Secondary | ICD-10-CM | POA: Diagnosis not present

## 2018-11-11 DIAGNOSIS — I10 Essential (primary) hypertension: Secondary | ICD-10-CM | POA: Diagnosis not present

## 2018-11-11 DIAGNOSIS — G8929 Other chronic pain: Secondary | ICD-10-CM | POA: Diagnosis not present

## 2018-11-11 DIAGNOSIS — R278 Other lack of coordination: Secondary | ICD-10-CM | POA: Diagnosis not present

## 2018-11-11 DIAGNOSIS — J690 Pneumonitis due to inhalation of food and vomit: Secondary | ICD-10-CM | POA: Diagnosis not present

## 2018-11-11 DIAGNOSIS — J9601 Acute respiratory failure with hypoxia: Secondary | ICD-10-CM | POA: Diagnosis not present

## 2018-11-11 DIAGNOSIS — E875 Hyperkalemia: Secondary | ICD-10-CM | POA: Diagnosis not present

## 2018-11-11 DIAGNOSIS — D649 Anemia, unspecified: Secondary | ICD-10-CM | POA: Diagnosis not present

## 2018-11-11 DIAGNOSIS — Z743 Need for continuous supervision: Secondary | ICD-10-CM | POA: Diagnosis not present

## 2018-11-11 DIAGNOSIS — Z741 Need for assistance with personal care: Secondary | ICD-10-CM | POA: Diagnosis not present

## 2018-11-11 DIAGNOSIS — R69 Illness, unspecified: Secondary | ICD-10-CM | POA: Diagnosis not present

## 2018-11-11 DIAGNOSIS — R600 Localized edema: Secondary | ICD-10-CM | POA: Diagnosis not present

## 2018-11-11 DIAGNOSIS — R410 Disorientation, unspecified: Secondary | ICD-10-CM | POA: Diagnosis not present

## 2018-11-11 DIAGNOSIS — K219 Gastro-esophageal reflux disease without esophagitis: Secondary | ICD-10-CM | POA: Diagnosis not present

## 2018-11-11 DIAGNOSIS — T887XXA Unspecified adverse effect of drug or medicament, initial encounter: Secondary | ICD-10-CM | POA: Diagnosis not present

## 2018-11-11 DIAGNOSIS — E559 Vitamin D deficiency, unspecified: Secondary | ICD-10-CM | POA: Diagnosis not present

## 2018-11-11 DIAGNOSIS — Z79899 Other long term (current) drug therapy: Secondary | ICD-10-CM | POA: Diagnosis not present

## 2018-11-11 DIAGNOSIS — R609 Edema, unspecified: Secondary | ICD-10-CM | POA: Diagnosis not present

## 2018-11-11 DIAGNOSIS — G9341 Metabolic encephalopathy: Secondary | ICD-10-CM | POA: Diagnosis not present

## 2018-11-11 DIAGNOSIS — J9692 Respiratory failure, unspecified with hypercapnia: Secondary | ICD-10-CM | POA: Diagnosis not present

## 2018-11-11 DIAGNOSIS — E039 Hypothyroidism, unspecified: Secondary | ICD-10-CM | POA: Diagnosis not present

## 2018-11-11 DIAGNOSIS — T40601A Poisoning by unspecified narcotics, accidental (unintentional), initial encounter: Secondary | ICD-10-CM | POA: Diagnosis not present

## 2018-11-11 DIAGNOSIS — I5042 Chronic combined systolic (congestive) and diastolic (congestive) heart failure: Secondary | ICD-10-CM | POA: Diagnosis not present

## 2018-11-11 DIAGNOSIS — R945 Abnormal results of liver function studies: Secondary | ICD-10-CM | POA: Diagnosis not present

## 2018-11-11 DIAGNOSIS — N179 Acute kidney failure, unspecified: Secondary | ICD-10-CM | POA: Diagnosis not present

## 2018-11-11 DIAGNOSIS — E785 Hyperlipidemia, unspecified: Secondary | ICD-10-CM | POA: Diagnosis not present

## 2018-11-11 DIAGNOSIS — R262 Difficulty in walking, not elsewhere classified: Secondary | ICD-10-CM | POA: Diagnosis not present

## 2018-11-11 DIAGNOSIS — F329 Major depressive disorder, single episode, unspecified: Secondary | ICD-10-CM | POA: Diagnosis not present

## 2018-11-17 DIAGNOSIS — I10 Essential (primary) hypertension: Secondary | ICD-10-CM | POA: Diagnosis not present

## 2018-11-17 DIAGNOSIS — M069 Rheumatoid arthritis, unspecified: Secondary | ICD-10-CM | POA: Diagnosis not present

## 2018-11-17 DIAGNOSIS — J449 Chronic obstructive pulmonary disease, unspecified: Secondary | ICD-10-CM | POA: Diagnosis not present

## 2018-11-17 DIAGNOSIS — D649 Anemia, unspecified: Secondary | ICD-10-CM | POA: Diagnosis not present

## 2018-11-18 DIAGNOSIS — J449 Chronic obstructive pulmonary disease, unspecified: Secondary | ICD-10-CM | POA: Diagnosis not present

## 2018-11-18 DIAGNOSIS — J9690 Respiratory failure, unspecified, unspecified whether with hypoxia or hypercapnia: Secondary | ICD-10-CM | POA: Diagnosis not present

## 2018-11-18 DIAGNOSIS — E875 Hyperkalemia: Secondary | ICD-10-CM | POA: Diagnosis not present

## 2018-11-18 DIAGNOSIS — J690 Pneumonitis due to inhalation of food and vomit: Secondary | ICD-10-CM | POA: Diagnosis not present

## 2018-12-01 ENCOUNTER — Other Ambulatory Visit: Payer: Self-pay | Admitting: *Deleted

## 2018-12-01 NOTE — Patient Outreach (Signed)
[facility]) Care Management  12/01/2018  Shawn Osborn 04-Jun-1951 [id number]  Facility site visit at [facility] and spoke with Olegario Shearer discharge planner to discuss patient's progress and plan for transitioning to home. Olegario Shearer states patient to be discharged 12/01/18 with home health.   Went to see patient in his room.  Patient was sitting in a wheelchair.  Patient stated he was looking forward to going home.  Patient stated he lived alone but had a daughter who checked in on him and helped him.  Patient stated he was in the hospital because his oxygen level got extremely low and he got really weak.  Patient stated he felt he had made a lot progress at the SNF but was not 100%.  Patient denies need for transportation assistance. Patient denies need for medication assistance.  [place] Management services and gave patient [facility] packet with contact information.  Patient agreed to services.   Referral made for [place] care manager to engage patient for transition of care.   Plan to make [facility] UM Team aware of [facility] CM referral.   Marvina Danner RN, [place] Acute Care Coordinator [phone]) Business Mobile [phone]) Toll free office

## 2018-12-03 ENCOUNTER — Other Ambulatory Visit: Payer: Self-pay

## 2018-12-03 DIAGNOSIS — J9612 Chronic respiratory failure with hypercapnia: Secondary | ICD-10-CM | POA: Diagnosis not present

## 2018-12-03 DIAGNOSIS — I10 Essential (primary) hypertension: Secondary | ICD-10-CM | POA: Diagnosis not present

## 2018-12-03 DIAGNOSIS — I110 Hypertensive heart disease with heart failure: Secondary | ICD-10-CM | POA: Diagnosis not present

## 2018-12-03 DIAGNOSIS — N3946 Mixed incontinence: Secondary | ICD-10-CM | POA: Diagnosis not present

## 2018-12-03 DIAGNOSIS — R600 Localized edema: Secondary | ICD-10-CM | POA: Diagnosis not present

## 2018-12-03 NOTE — Patient Outreach (Signed)
Transition of care:  Discharged from [facility] on 12/01/2018.  Placed call to patient who answered and reports he is at MD office and MD would like to speak with me. I spoke with Dr. Tobie Poet, who reviewed [facility] program with patient and patient agreed and made an appointment for home visit on 12/04/2018. Confirmed address with patient and MD.  PLAN: Initial home visit on 12/04/2018 at [place], RN, BSN, [place] Coordinator [phone]

## 2018-12-04 ENCOUNTER — Other Ambulatory Visit: Payer: Self-pay

## 2018-12-04 DIAGNOSIS — Z7982 Long term (current) use of aspirin: Secondary | ICD-10-CM | POA: Diagnosis not present

## 2018-12-04 DIAGNOSIS — F17210 Nicotine dependence, cigarettes, uncomplicated: Secondary | ICD-10-CM | POA: Diagnosis not present

## 2018-12-04 DIAGNOSIS — G8929 Other chronic pain: Secondary | ICD-10-CM | POA: Diagnosis not present

## 2018-12-04 DIAGNOSIS — G629 Polyneuropathy, unspecified: Secondary | ICD-10-CM | POA: Diagnosis not present

## 2018-12-04 DIAGNOSIS — D649 Anemia, unspecified: Secondary | ICD-10-CM | POA: Diagnosis not present

## 2018-12-04 DIAGNOSIS — Z9181 History of falling: Secondary | ICD-10-CM | POA: Diagnosis not present

## 2018-12-04 DIAGNOSIS — I5042 Chronic combined systolic (congestive) and diastolic (congestive) heart failure: Secondary | ICD-10-CM | POA: Diagnosis not present

## 2018-12-04 DIAGNOSIS — K219 Gastro-esophageal reflux disease without esophagitis: Secondary | ICD-10-CM | POA: Diagnosis not present

## 2018-12-04 DIAGNOSIS — G9341 Metabolic encephalopathy: Secondary | ICD-10-CM | POA: Diagnosis not present

## 2018-12-04 DIAGNOSIS — M069 Rheumatoid arthritis, unspecified: Secondary | ICD-10-CM | POA: Diagnosis not present

## 2018-12-04 DIAGNOSIS — F329 Major depressive disorder, single episode, unspecified: Secondary | ICD-10-CM | POA: Diagnosis not present

## 2018-12-04 DIAGNOSIS — I110 Hypertensive heart disease with heart failure: Secondary | ICD-10-CM | POA: Diagnosis not present

## 2018-12-04 DIAGNOSIS — T40601D Poisoning by unspecified narcotics, accidental (unintentional), subsequent encounter: Secondary | ICD-10-CM | POA: Diagnosis not present

## 2018-12-04 DIAGNOSIS — E785 Hyperlipidemia, unspecified: Secondary | ICD-10-CM | POA: Diagnosis not present

## 2018-12-04 DIAGNOSIS — Z79891 Long term (current) use of opiate analgesic: Secondary | ICD-10-CM | POA: Diagnosis not present

## 2018-12-04 DIAGNOSIS — Z9049 Acquired absence of other specified parts of digestive tract: Secondary | ICD-10-CM | POA: Diagnosis not present

## 2018-12-04 DIAGNOSIS — J690 Pneumonitis due to inhalation of food and vomit: Secondary | ICD-10-CM | POA: Diagnosis not present

## 2018-12-04 DIAGNOSIS — J9601 Acute respiratory failure with hypoxia: Secondary | ICD-10-CM | POA: Diagnosis not present

## 2018-12-04 DIAGNOSIS — R609 Edema, unspecified: Secondary | ICD-10-CM | POA: Diagnosis not present

## 2018-12-04 DIAGNOSIS — J449 Chronic obstructive pulmonary disease, unspecified: Secondary | ICD-10-CM | POA: Diagnosis not present

## 2018-12-04 NOTE — Patient Outreach (Signed)
[facility]) Care Management   12/04/2018  GEORGI NAVARRETE 1951/02/23 [id number]  KHAIR CHASTEEN is an 68 y.o. male Arrived for home visit, [facility] nurse sitting at kitchen table with patient.  Subjective: Patient reports that he occasionally takes extra pain medications for his chronic back and shoulder pain. Reports he was found to have  High Co2 level in the hospital. Patient reports that his pain is not under control.  Reports saw primary MD yesterday and pain medication and lasix was increased.  Reports he stopped smoking on 11/07/2019.  Patient reports living alone since the death of his wife 7 years ago. Reports he eats out a lot. Patient reports that he uses his walker but has difficulty getting it into his pick up truck because it is heavy.  Reports he thinks that his walker is too big.  Patient reports increase swelling to lower legs and weeping while at SNF.  Patient admits to problems keeping house and bathing. [facility] nurse will request a referral for regional consolidated services and a bath aid.  Daughter reports long term plan ( this spring) is to sale house and move patient into a one bedroom apartment or assisted living.  Daughter has gotten medications now pill packed for patient.  Patient denies any intentional overdose of pain medications.   Objective:  Awake and alert. Sitting at kitchen table.  Able to ambulate with a bariatric walker leaning over walker due to back pain.   Today's Vitals   12/04/18 1404 12/04/18 1409  BP: 104/62   Pulse: 77   Resp: 18   SpO2: 92%   Weight: (!) 327 lb (148.3 kg)   Height: 1.803 m (5\' 11" )   PainSc:  8    Review of Systems  Constitutional: Negative.   HENT: Negative.   Respiratory: Positive for cough, sputum production, shortness of breath and wheezing.        Reports yellow sputum  Cardiovascular: Positive for leg swelling.  Gastrointestinal: Negative.   Genitourinary: Negative for frequency.   Musculoskeletal: Positive for back pain and joint pain.  Skin:       Thin skin  Neurological: Negative.   Endo/Heme/Allergies: Bruises/bleeds easily.  Psychiatric/Behavioral: Negative.     Physical Exam  Constitutional: He is oriented to person, place, and time. He appears well-developed and well-nourished.  Cardiovascular: Normal rate, regular rhythm and normal heart sounds.  Respiratory: Effort normal and breath sounds normal.  Cough noted  GI: Soft. Bowel sounds are normal.  Musculoskeletal: Normal range of motion.        General: Edema present.     Comments: 3 plus edema  Neurological: He is alert and oriented to person, place, and time.  Skin: Skin is warm and dry.  3 plus edema to the lower legs.   Psychiatric: He has a normal mood and affect. His behavior is normal. Judgment and thought content normal.    Encounter Medications:   Outpatient Encounter Medications as of 12/04/2018  Medication Sig  . aspirin 81 MG tablet Take 81 mg by mouth daily.  Marland Kitchen atenolol (TENORMIN) 50 MG tablet Take 50 mg by mouth daily.  Marland Kitchen donepezil (ARICEPT) 5 MG tablet Take 5 mg by mouth at bedtime.  . formoterol (PERFOROMIST) 20 MCG/2ML nebulizer solution Take 20 mcg by nebulization 2 (two) times daily.  . furosemide (LASIX) 20 MG tablet Take 20 mg by mouth as needed.  Marland Kitchen lisinopril (PRINIVIL,ZESTRIL) 20 MG tablet Take 20 mg by mouth daily.  Marland Kitchen  meloxicam (MOBIC) 15 MG tablet Take 15 mg by mouth daily.  Marland Kitchen omeprazole (PRILOSEC) 20 MG capsule Take 20 mg by mouth 2 (two) times daily.  Marland Kitchen oxyCODONE (OXY IR/ROXICODONE) 5 MG immediate release tablet Take 5 mg by mouth 4 (four) times daily.  . pregabalin (LYRICA) 150 MG capsule Take 150 mg by mouth 2 (two) times daily.  . Revefenacin 175 MCG/3ML SOLN Inhale 1 vial into the lungs.  Marland Kitchen rOPINIRole (REQUIP) 2 MG tablet Take 2 mg by mouth 2 (two) times daily.   . rosuvastatin (CRESTOR) 5 MG tablet Take 5 mg by mouth daily.  . sertraline (ZOLOFT) 100 MG tablet Take  100 mg by mouth daily.  Marland Kitchen albuterol (PROVENTIL) (2.5 MG/3ML) 0.083% nebulizer solution Take 2.5 mg by nebulization every 6 (six) hours as needed.  Marland Kitchen FOLATE-B12-INTRINSIC FACTOR PO Take by mouth daily.   . folic acid (FOLVITE) 948 MCG tablet Take 400 mcg by mouth daily.  . [DISCONTINUED] budesonide-formoterol (SYMBICORT) 160-4.5 MCG/ACT inhaler Inhale 2 puffs into the lungs 2 (two) times daily.  . [DISCONTINUED] clonazePAM (KLONOPIN) 1 MG tablet Take 1 mg by mouth at bedtime as needed.  . [DISCONTINUED] cloNIDine (CATAPRES) 0.3 MG tablet Take 0.3 mg by mouth 2 (two) times daily.  . [DISCONTINUED] HYDROcodone-acetaminophen (NORCO) 10-325 MG per tablet Take 1 tablet by mouth every 8 (eight) hours as needed.  . [DISCONTINUED] hydroxychloroquine (PLAQUENIL) 200 MG tablet Take by mouth 2 (two) times daily.  . [DISCONTINUED] methotrexate (RHEUMATREX) 2.5 MG tablet Take 2.5 mg by mouth once a week. Caution:Chemotherapy. Protect from light.    Take 8 tablets weekly  . [DISCONTINUED] mometasone (NASONEX) 50 MCG/ACT nasal spray Place 2 sprays into the nose daily.  . [DISCONTINUED] omega-3 acid ethyl esters (LOVAZA) 1 G capsule Take 2 g by mouth 2 (two) times daily.  . [DISCONTINUED] predniSONE (DELTASONE) 20 MG tablet Take 20 mg by mouth 2 (two) times daily.  . [DISCONTINUED] quinapril (ACCUPRIL) 40 MG tablet Take 40 mg by mouth 2 (two) times daily.  . [DISCONTINUED] traZODone (DESYREL) 100 MG tablet Take 100 mg by mouth at bedtime.  . [DISCONTINUED] valACYclovir (VALTREX) 500 MG tablet Take 500 mg by mouth as needed.   No facility-administered encounter medications on file as of 12/04/2018.     Functional Status:   In your present state of health, do you have any difficulty performing the following activities: 12/04/2018  Hearing? N  Vision? N  Difficulty concentrating or making decisions? Y  Walking or climbing stairs? Y  Dressing or bathing? Y  Doing errands, shopping? N  Preparing Food and eating  ? N  Using the Toilet? N  In the past six months, have you accidently leaked urine? Y  Do you have problems with loss of bowel control? N  Managing your Medications? N  Comment just started prevo pak.  Managing your Finances? N  Housekeeping or managing your Housekeeping? [place] [facility] nurse states she will order RCS  Some recent data might be hidden    Fall/Depression Screening:    Fall Risk  12/04/2018 06/10/2017  Falls in the past year? 1 No  Comment - Emmi Telephone Survey: data to providers prior to load  Number falls in past yr: 0 -  Injury with Fall? 1 -  Risk for fall due to : History of fall(s);Impaired vision -   PHQ 2/9 Scores 12/04/2018  PHQ - 2 Score 0    Assessment:   (1) reviewed [facility] program. Provided a new  patient packet. Reviewed consent. Provided my contact information and 24 hour nurse line magnet.  (2) recent admission for decreased level of consciousness thought to be related to pain medications/COPD and heart failure. (3) CHF: increased swelling to lower legs. No scale.  Eats out often. (4) COPD: continues to cough up yellow sputum.  Taking medications as prescribed. No rescue inhaler. (5) fall risk:  Active with [facility] PT (6) no advanced directives (7)medication management: meds now pill packed via [facility]. ( excluding pain medications) (8) unable to read. (9) request assistance with bathing. Bariatric shower chair with back in walkin shower noted. Wand also noted for ease of rinsing.  (10) reports chronic pain. Is interested in pain clinic.   Plan:  (1) consent scanned into chart.Will contact patient weekly for transition of care. Patient prefers afternoon calls. (2) Reviewed with patient the importance of taking all medications as prescribed. (3) Reviewed with Sharyn Lull ( [place] home health nurse) heart failure concerns. Sharyn Lull states she will get a scale for patient. Reviewed with daughter in crease in Lasix by primary  Md yesterday. Daughter will pick up RX today. Provided picture poster of low and high salt food. Provided heart failure THN packet and reviewed with patient and daughter. Encouraged patient to weigh daily and record. Provided THN calendar and weight log.   Encouraged patient to call MD for weight gain of 3 pounds over night or 5 pounds in a week. Encouraged patient to elevate legs.  (4) Reviewed COPD zones with patient and provided a COPD THN packet. Reviewed importance of using nebulizer daily as prescribed. Reviewed with patient what a rescue inhaler in and how it is used. Will inquire about RX from MD. (5) Reviewed safety precautions with patient. My opinion is bariatric walker is set correctly for patient. Encouraged patient to do his daily exercises. (6) Provided advanced directive packet and reviewed with patient and daughter how to complete. Encouraged completion. (7) Reviewed importance of taking all medications as prescribed.  (8) Provided verbal instruction and visual poster of low salt foods. Using red for bad and green for good. (9)[facility] nurse will refer to RCS. (10) will report to MD patients interest in pain clinic for chronic pain.   Care planning and goal setting during home visit and patients primary goal is be out of pain.  Patient will be contacted weekly for transition of care.  This note and barrier letter sent to MD. [facility] CM Care Plan Problem One     Most Recent Value  Care Plan Problem One  Recent admission for alteration in mental status due to pain mediations and COPD/CHF  Role Documenting the Problem One  Care Management [place] for Problem One  Active  [facility] Long Term Goal   Patient will report pain under better control in the next 60 days.   THN Long Term Goal Start Date  12/04/18  Interventions for Problem One Long Term Goal   This home visit noted sent to MD with request for consideration of pain clinic referral. Home visit completed. Reviewed  pain with patient and discuss ways to control pain. Enocuraged paitent not to take extra pain medications. Enocuraged paitent to be active and complete home exercises daily.  THN CM Short Term Goal #1   Patient will report following low salt diet for the next 30 days.   THN CM Short Term Goal #1 Start Date  12/04/18  Interventions for Short Term Goal #1  Provided low salt  visual aid to patient. Reviewed importance of low salt diet with swelling of the legs.  Encouraged paient to limit take out and fast foods.   THN CM Short Term Goal #2   Patient will report completing advanced directive packet in the next 14 days.   THN CM Short Term Goal #2 Start Date  12/04/18  Interventions for Short Term Goal #2  Reviewed importance of completing advanced directive packet. Reviewed with patient and daughter.   THN CM Short Term Goal #3  Patient will reports taking all medications as prescribed, for the next 30 days.   THN CM Short Term Goal #3 Start Date  12/04/18  Interventions for Short Tern Goal #3  Reviewed importance of medication compliance with patient and daughter.     THN CM Care Plan Problem Two     Most Recent Value  Care Plan Problem Two  Knowledge deficit related to COPD.  Role Documenting the Problem Two  Care Management [place] for Problem Two  Active  Interventions for Problem Two Long Term Goal   Reviewed COPD zones. Provided THN calendar and COPD [facility] packet about COPD. Encouraged paitent to refrain from smoking.   THN Long Term Goal  Patient will reports understanding how to self manage COPD better in the next 45 days,   [facility] Long Term Goal Start Date  12/04/18     Tomasa Rand, RN, BSN, [place] Coordinator [phone]

## 2018-12-11 ENCOUNTER — Other Ambulatory Visit: Payer: Self-pay

## 2018-12-11 DIAGNOSIS — J9601 Acute respiratory failure with hypoxia: Secondary | ICD-10-CM | POA: Diagnosis not present

## 2018-12-11 DIAGNOSIS — D649 Anemia, unspecified: Secondary | ICD-10-CM | POA: Diagnosis not present

## 2018-12-11 DIAGNOSIS — T40601D Poisoning by unspecified narcotics, accidental (unintentional), subsequent encounter: Secondary | ICD-10-CM | POA: Diagnosis not present

## 2018-12-11 DIAGNOSIS — I110 Hypertensive heart disease with heart failure: Secondary | ICD-10-CM | POA: Diagnosis not present

## 2018-12-11 DIAGNOSIS — J449 Chronic obstructive pulmonary disease, unspecified: Secondary | ICD-10-CM | POA: Diagnosis not present

## 2018-12-11 DIAGNOSIS — G9341 Metabolic encephalopathy: Secondary | ICD-10-CM | POA: Diagnosis not present

## 2018-12-11 NOTE — Patient Outreach (Signed)
Transition of care:  Placed call to patient for transition of care. Patient reports that he is feeling better. Reports that his breathing has improved. States that home health is not able to provide a scale because of no signal in his home.  Reports that he continues to use a walker and home health PT is coming tomorrow.  States pain is unchanged in his back and shoulder.  Reports he has all his medications and is taking them as prescribed.  PLAN: ordered a scale from [facility] to be delivered to patients home. Reviewed importance of daily weights. Encouraged patient to call MD for any changes in condition. Next outreach in 1 week.  Tomasa Rand, RN, BSN, CEN [facility] [employer] [phone]

## 2018-12-12 DIAGNOSIS — T40601D Poisoning by unspecified narcotics, accidental (unintentional), subsequent encounter: Secondary | ICD-10-CM | POA: Diagnosis not present

## 2018-12-12 DIAGNOSIS — D649 Anemia, unspecified: Secondary | ICD-10-CM | POA: Diagnosis not present

## 2018-12-12 DIAGNOSIS — J9601 Acute respiratory failure with hypoxia: Secondary | ICD-10-CM | POA: Diagnosis not present

## 2018-12-12 DIAGNOSIS — I110 Hypertensive heart disease with heart failure: Secondary | ICD-10-CM | POA: Diagnosis not present

## 2018-12-12 DIAGNOSIS — G9341 Metabolic encephalopathy: Secondary | ICD-10-CM | POA: Diagnosis not present

## 2018-12-12 DIAGNOSIS — J449 Chronic obstructive pulmonary disease, unspecified: Secondary | ICD-10-CM | POA: Diagnosis not present

## 2018-12-16 DIAGNOSIS — F0390 Unspecified dementia without behavioral disturbance: Secondary | ICD-10-CM | POA: Diagnosis present

## 2018-12-16 DIAGNOSIS — Z79891 Long term (current) use of opiate analgesic: Secondary | ICD-10-CM | POA: Diagnosis not present

## 2018-12-16 DIAGNOSIS — I10 Essential (primary) hypertension: Secondary | ICD-10-CM | POA: Diagnosis not present

## 2018-12-16 DIAGNOSIS — I110 Hypertensive heart disease with heart failure: Secondary | ICD-10-CM | POA: Diagnosis not present

## 2018-12-16 DIAGNOSIS — I5033 Acute on chronic diastolic (congestive) heart failure: Secondary | ICD-10-CM | POA: Diagnosis not present

## 2018-12-16 DIAGNOSIS — G9341 Metabolic encephalopathy: Secondary | ICD-10-CM | POA: Diagnosis not present

## 2018-12-16 DIAGNOSIS — J9601 Acute respiratory failure with hypoxia: Secondary | ICD-10-CM | POA: Diagnosis not present

## 2018-12-16 DIAGNOSIS — Z6841 Body Mass Index (BMI) 40.0 and over, adult: Secondary | ICD-10-CM | POA: Diagnosis not present

## 2018-12-16 DIAGNOSIS — I509 Heart failure, unspecified: Secondary | ICD-10-CM | POA: Diagnosis not present

## 2018-12-16 DIAGNOSIS — J449 Chronic obstructive pulmonary disease, unspecified: Secondary | ICD-10-CM | POA: Diagnosis not present

## 2018-12-16 DIAGNOSIS — J80 Acute respiratory distress syndrome: Secondary | ICD-10-CM | POA: Diagnosis not present

## 2018-12-16 DIAGNOSIS — M1990 Unspecified osteoarthritis, unspecified site: Secondary | ICD-10-CM | POA: Diagnosis present

## 2018-12-16 DIAGNOSIS — G4730 Sleep apnea, unspecified: Secondary | ICD-10-CM | POA: Diagnosis present

## 2018-12-16 DIAGNOSIS — F418 Other specified anxiety disorders: Secondary | ICD-10-CM | POA: Diagnosis present

## 2018-12-16 DIAGNOSIS — R0602 Shortness of breath: Secondary | ICD-10-CM | POA: Diagnosis not present

## 2018-12-16 DIAGNOSIS — R0689 Other abnormalities of breathing: Secondary | ICD-10-CM | POA: Diagnosis not present

## 2018-12-16 DIAGNOSIS — Z79899 Other long term (current) drug therapy: Secondary | ICD-10-CM | POA: Diagnosis not present

## 2018-12-16 DIAGNOSIS — Z7952 Long term (current) use of systemic steroids: Secondary | ICD-10-CM | POA: Diagnosis not present

## 2018-12-16 DIAGNOSIS — J9602 Acute respiratory failure with hypercapnia: Secondary | ICD-10-CM | POA: Diagnosis present

## 2018-12-16 DIAGNOSIS — T40601D Poisoning by unspecified narcotics, accidental (unintentional), subsequent encounter: Secondary | ICD-10-CM | POA: Diagnosis not present

## 2018-12-16 DIAGNOSIS — J441 Chronic obstructive pulmonary disease with (acute) exacerbation: Secondary | ICD-10-CM | POA: Diagnosis not present

## 2018-12-16 DIAGNOSIS — G8929 Other chronic pain: Secondary | ICD-10-CM | POA: Diagnosis present

## 2018-12-16 DIAGNOSIS — R0902 Hypoxemia: Secondary | ICD-10-CM | POA: Diagnosis not present

## 2018-12-18 ENCOUNTER — Other Ambulatory Visit: Payer: Self-pay

## 2018-12-18 NOTE — Patient Outreach (Signed)
Transition of care: Placed call to patient with no answer. Placed call to daughter [name], who states patient is in [facility] with heart failure since 12/16/2018.  Daughter reports plan is for patient to be discharged on 12/19/2018. Reviewed if scale was delivered and daughter thinks so but will check.  PLAN: follow up call on Monday 12/22/2018.  Tomasa Rand, RN, BSN, CEN [facility] [employer] [phone]

## 2018-12-20 DIAGNOSIS — D649 Anemia, unspecified: Secondary | ICD-10-CM | POA: Diagnosis not present

## 2018-12-20 DIAGNOSIS — I110 Hypertensive heart disease with heart failure: Secondary | ICD-10-CM | POA: Diagnosis not present

## 2018-12-20 DIAGNOSIS — T40601D Poisoning by unspecified narcotics, accidental (unintentional), subsequent encounter: Secondary | ICD-10-CM | POA: Diagnosis not present

## 2018-12-20 DIAGNOSIS — J449 Chronic obstructive pulmonary disease, unspecified: Secondary | ICD-10-CM | POA: Diagnosis not present

## 2018-12-20 DIAGNOSIS — J9601 Acute respiratory failure with hypoxia: Secondary | ICD-10-CM | POA: Diagnosis not present

## 2018-12-20 DIAGNOSIS — G9341 Metabolic encephalopathy: Secondary | ICD-10-CM | POA: Diagnosis not present

## 2018-12-22 ENCOUNTER — Other Ambulatory Visit: Payer: Self-pay

## 2018-12-23 ENCOUNTER — Other Ambulatory Visit: Payer: Self-pay

## 2018-12-23 NOTE — Patient Outreach (Signed)
Transition of care: Discharged from [place] on 12/19/2018.  12/22/2018 late entry:  Placed call to patient for transition of care. No answer.   PLAN: will call back in 24 hours on 12/23/2018.  Tomasa Rand, RN, BSN, CEN [facility] [employer] [phone]

## 2018-12-23 NOTE — Patient Outreach (Signed)
Transition of care: Outreach call to patient who answered and reports that he is feeling good. Reports that he has gotten his scale and he is weighing daily. Reports weight in the last 4 days is 315-316 pounds. Reports continued swelling of lower legs and reports it is not as bad as it use to be.  Report he has all his medications and is taking it as prescribed. Reports pain is bearable.  Reports follow up with Dr. Tobie Poet planned for 01/01/2019 but he will call later today to inquire if he needs to come in sooner.   PLAN: will continue weekly transition of care calls. Encouraged patient to continue to weigh daily, report weight gain, and follow low salt diet.   Tomasa Rand, RN, BSN, CEN [facility] [employer] [phone]

## 2018-12-24 DIAGNOSIS — G9341 Metabolic encephalopathy: Secondary | ICD-10-CM | POA: Diagnosis not present

## 2018-12-24 DIAGNOSIS — J9601 Acute respiratory failure with hypoxia: Secondary | ICD-10-CM | POA: Diagnosis not present

## 2018-12-24 DIAGNOSIS — J449 Chronic obstructive pulmonary disease, unspecified: Secondary | ICD-10-CM | POA: Diagnosis not present

## 2018-12-24 DIAGNOSIS — D649 Anemia, unspecified: Secondary | ICD-10-CM | POA: Diagnosis not present

## 2018-12-24 DIAGNOSIS — T40601D Poisoning by unspecified narcotics, accidental (unintentional), subsequent encounter: Secondary | ICD-10-CM | POA: Diagnosis not present

## 2018-12-24 DIAGNOSIS — I110 Hypertensive heart disease with heart failure: Secondary | ICD-10-CM | POA: Diagnosis not present

## 2018-12-25 DIAGNOSIS — I110 Hypertensive heart disease with heart failure: Secondary | ICD-10-CM | POA: Diagnosis not present

## 2018-12-25 DIAGNOSIS — G9341 Metabolic encephalopathy: Secondary | ICD-10-CM | POA: Diagnosis not present

## 2018-12-25 DIAGNOSIS — J449 Chronic obstructive pulmonary disease, unspecified: Secondary | ICD-10-CM | POA: Diagnosis not present

## 2018-12-25 DIAGNOSIS — D649 Anemia, unspecified: Secondary | ICD-10-CM | POA: Diagnosis not present

## 2018-12-25 DIAGNOSIS — T40601D Poisoning by unspecified narcotics, accidental (unintentional), subsequent encounter: Secondary | ICD-10-CM | POA: Diagnosis not present

## 2018-12-25 DIAGNOSIS — J9601 Acute respiratory failure with hypoxia: Secondary | ICD-10-CM | POA: Diagnosis not present

## 2018-12-29 DIAGNOSIS — J9601 Acute respiratory failure with hypoxia: Secondary | ICD-10-CM | POA: Diagnosis not present

## 2018-12-29 DIAGNOSIS — D649 Anemia, unspecified: Secondary | ICD-10-CM | POA: Diagnosis not present

## 2018-12-29 DIAGNOSIS — I110 Hypertensive heart disease with heart failure: Secondary | ICD-10-CM | POA: Diagnosis not present

## 2018-12-29 DIAGNOSIS — G9341 Metabolic encephalopathy: Secondary | ICD-10-CM | POA: Diagnosis not present

## 2018-12-29 DIAGNOSIS — T40601D Poisoning by unspecified narcotics, accidental (unintentional), subsequent encounter: Secondary | ICD-10-CM | POA: Diagnosis not present

## 2018-12-29 DIAGNOSIS — J449 Chronic obstructive pulmonary disease, unspecified: Secondary | ICD-10-CM | POA: Diagnosis not present

## 2018-12-30 ENCOUNTER — Other Ambulatory Visit: Payer: Self-pay

## 2018-12-30 DIAGNOSIS — J9601 Acute respiratory failure with hypoxia: Secondary | ICD-10-CM | POA: Diagnosis not present

## 2018-12-30 DIAGNOSIS — I110 Hypertensive heart disease with heart failure: Secondary | ICD-10-CM | POA: Diagnosis not present

## 2018-12-30 DIAGNOSIS — G9341 Metabolic encephalopathy: Secondary | ICD-10-CM | POA: Diagnosis not present

## 2018-12-30 DIAGNOSIS — T40601D Poisoning by unspecified narcotics, accidental (unintentional), subsequent encounter: Secondary | ICD-10-CM | POA: Diagnosis not present

## 2018-12-30 DIAGNOSIS — D649 Anemia, unspecified: Secondary | ICD-10-CM | POA: Diagnosis not present

## 2018-12-30 DIAGNOSIS — J449 Chronic obstructive pulmonary disease, unspecified: Secondary | ICD-10-CM | POA: Diagnosis not present

## 2018-12-31 ENCOUNTER — Other Ambulatory Visit: Payer: Self-pay

## 2018-12-31 DIAGNOSIS — D649 Anemia, unspecified: Secondary | ICD-10-CM | POA: Diagnosis not present

## 2018-12-31 DIAGNOSIS — J449 Chronic obstructive pulmonary disease, unspecified: Secondary | ICD-10-CM | POA: Diagnosis not present

## 2018-12-31 DIAGNOSIS — G9341 Metabolic encephalopathy: Secondary | ICD-10-CM | POA: Diagnosis not present

## 2018-12-31 DIAGNOSIS — J9601 Acute respiratory failure with hypoxia: Secondary | ICD-10-CM | POA: Diagnosis not present

## 2018-12-31 DIAGNOSIS — I110 Hypertensive heart disease with heart failure: Secondary | ICD-10-CM | POA: Diagnosis not present

## 2018-12-31 DIAGNOSIS — T40601D Poisoning by unspecified narcotics, accidental (unintentional), subsequent encounter: Secondary | ICD-10-CM | POA: Diagnosis not present

## 2018-12-31 NOTE — Patient Outreach (Signed)
Transition of care:  Placed call to patient with no answer. Placed call to daughter, [name] who states that patient is doing well. Reports decreased swelling in legs and his breathing is good. [name] reports follow up planned with primary Md tomorrow. Daughter continues to be in charge of medications.  Home health remain active for nursing, PT and bath aid.  Daughter denies any new concerns today.  PLAN: Encouraged daughter to remain patient to weigh daily and follow low salt diet.  Will plan follow up call in 1 week.  Tomasa Rand, RN, BSN, CEN [facility] [employer] [phone]

## 2018-12-31 NOTE — Patient Outreach (Signed)
Late entry: 12/30/2018 Placed call to patient for transition of care. No answer. Left a message for patient to return call.  Plan: Will call back on 12/31/2018  Tomasa Rand, RN, BSN, CEN [facility] [employer] [phone]

## 2019-01-01 DIAGNOSIS — J410 Simple chronic bronchitis: Secondary | ICD-10-CM | POA: Diagnosis not present

## 2019-01-01 DIAGNOSIS — M545 Low back pain: Secondary | ICD-10-CM | POA: Diagnosis not present

## 2019-01-01 DIAGNOSIS — M25512 Pain in left shoulder: Secondary | ICD-10-CM | POA: Diagnosis not present

## 2019-01-01 DIAGNOSIS — G894 Chronic pain syndrome: Secondary | ICD-10-CM | POA: Diagnosis not present

## 2019-01-01 DIAGNOSIS — E782 Mixed hyperlipidemia: Secondary | ICD-10-CM | POA: Diagnosis not present

## 2019-01-01 DIAGNOSIS — M0589 Other rheumatoid arthritis with rheumatoid factor of multiple sites: Secondary | ICD-10-CM | POA: Diagnosis not present

## 2019-01-01 DIAGNOSIS — I110 Hypertensive heart disease with heart failure: Secondary | ICD-10-CM | POA: Diagnosis not present

## 2019-01-01 DIAGNOSIS — R7301 Impaired fasting glucose: Secondary | ICD-10-CM | POA: Diagnosis not present

## 2019-01-02 DIAGNOSIS — J449 Chronic obstructive pulmonary disease, unspecified: Secondary | ICD-10-CM | POA: Diagnosis not present

## 2019-01-02 DIAGNOSIS — G9341 Metabolic encephalopathy: Secondary | ICD-10-CM | POA: Diagnosis not present

## 2019-01-02 DIAGNOSIS — T40601D Poisoning by unspecified narcotics, accidental (unintentional), subsequent encounter: Secondary | ICD-10-CM | POA: Diagnosis not present

## 2019-01-02 DIAGNOSIS — J9601 Acute respiratory failure with hypoxia: Secondary | ICD-10-CM | POA: Diagnosis not present

## 2019-01-02 DIAGNOSIS — I110 Hypertensive heart disease with heart failure: Secondary | ICD-10-CM | POA: Diagnosis not present

## 2019-01-02 DIAGNOSIS — D649 Anemia, unspecified: Secondary | ICD-10-CM | POA: Diagnosis not present

## 2019-01-03 DIAGNOSIS — G629 Polyneuropathy, unspecified: Secondary | ICD-10-CM | POA: Diagnosis not present

## 2019-01-03 DIAGNOSIS — K219 Gastro-esophageal reflux disease without esophagitis: Secondary | ICD-10-CM | POA: Diagnosis not present

## 2019-01-03 DIAGNOSIS — E785 Hyperlipidemia, unspecified: Secondary | ICD-10-CM | POA: Diagnosis not present

## 2019-01-03 DIAGNOSIS — F329 Major depressive disorder, single episode, unspecified: Secondary | ICD-10-CM | POA: Diagnosis not present

## 2019-01-03 DIAGNOSIS — G8929 Other chronic pain: Secondary | ICD-10-CM | POA: Diagnosis not present

## 2019-01-03 DIAGNOSIS — I5033 Acute on chronic diastolic (congestive) heart failure: Secondary | ICD-10-CM | POA: Diagnosis not present

## 2019-01-03 DIAGNOSIS — Z79891 Long term (current) use of opiate analgesic: Secondary | ICD-10-CM | POA: Diagnosis not present

## 2019-01-03 DIAGNOSIS — J9601 Acute respiratory failure with hypoxia: Secondary | ICD-10-CM | POA: Diagnosis not present

## 2019-01-03 DIAGNOSIS — Z9181 History of falling: Secondary | ICD-10-CM | POA: Diagnosis not present

## 2019-01-03 DIAGNOSIS — G9341 Metabolic encephalopathy: Secondary | ICD-10-CM | POA: Diagnosis not present

## 2019-01-03 DIAGNOSIS — F419 Anxiety disorder, unspecified: Secondary | ICD-10-CM | POA: Diagnosis not present

## 2019-01-03 DIAGNOSIS — T40601D Poisoning by unspecified narcotics, accidental (unintentional), subsequent encounter: Secondary | ICD-10-CM | POA: Diagnosis not present

## 2019-01-03 DIAGNOSIS — F0390 Unspecified dementia without behavioral disturbance: Secondary | ICD-10-CM | POA: Diagnosis not present

## 2019-01-03 DIAGNOSIS — I110 Hypertensive heart disease with heart failure: Secondary | ICD-10-CM | POA: Diagnosis not present

## 2019-01-03 DIAGNOSIS — Z9049 Acquired absence of other specified parts of digestive tract: Secondary | ICD-10-CM | POA: Diagnosis not present

## 2019-01-03 DIAGNOSIS — Z7982 Long term (current) use of aspirin: Secondary | ICD-10-CM | POA: Diagnosis not present

## 2019-01-03 DIAGNOSIS — J449 Chronic obstructive pulmonary disease, unspecified: Secondary | ICD-10-CM | POA: Diagnosis not present

## 2019-01-03 DIAGNOSIS — D649 Anemia, unspecified: Secondary | ICD-10-CM | POA: Diagnosis not present

## 2019-01-03 DIAGNOSIS — J690 Pneumonitis due to inhalation of food and vomit: Secondary | ICD-10-CM | POA: Diagnosis not present

## 2019-01-03 DIAGNOSIS — F17210 Nicotine dependence, cigarettes, uncomplicated: Secondary | ICD-10-CM | POA: Diagnosis not present

## 2019-01-03 DIAGNOSIS — Z6841 Body Mass Index (BMI) 40.0 and over, adult: Secondary | ICD-10-CM | POA: Diagnosis not present

## 2019-01-03 DIAGNOSIS — M069 Rheumatoid arthritis, unspecified: Secondary | ICD-10-CM | POA: Diagnosis not present

## 2019-01-06 DIAGNOSIS — F0390 Unspecified dementia without behavioral disturbance: Secondary | ICD-10-CM | POA: Diagnosis not present

## 2019-01-06 DIAGNOSIS — G9341 Metabolic encephalopathy: Secondary | ICD-10-CM | POA: Diagnosis not present

## 2019-01-06 DIAGNOSIS — I110 Hypertensive heart disease with heart failure: Secondary | ICD-10-CM | POA: Diagnosis not present

## 2019-01-06 DIAGNOSIS — I5033 Acute on chronic diastolic (congestive) heart failure: Secondary | ICD-10-CM | POA: Diagnosis not present

## 2019-01-06 DIAGNOSIS — T40601D Poisoning by unspecified narcotics, accidental (unintentional), subsequent encounter: Secondary | ICD-10-CM | POA: Diagnosis not present

## 2019-01-06 DIAGNOSIS — J9601 Acute respiratory failure with hypoxia: Secondary | ICD-10-CM | POA: Diagnosis not present

## 2019-01-07 ENCOUNTER — Ambulatory Visit: Payer: Self-pay

## 2019-01-07 DIAGNOSIS — J9601 Acute respiratory failure with hypoxia: Secondary | ICD-10-CM | POA: Diagnosis not present

## 2019-01-07 DIAGNOSIS — F0390 Unspecified dementia without behavioral disturbance: Secondary | ICD-10-CM | POA: Diagnosis not present

## 2019-01-07 DIAGNOSIS — M75122 Complete rotator cuff tear or rupture of left shoulder, not specified as traumatic: Secondary | ICD-10-CM | POA: Diagnosis not present

## 2019-01-07 DIAGNOSIS — M545 Low back pain: Secondary | ICD-10-CM | POA: Diagnosis not present

## 2019-01-07 DIAGNOSIS — M5126 Other intervertebral disc displacement, lumbar region: Secondary | ICD-10-CM | POA: Diagnosis not present

## 2019-01-07 DIAGNOSIS — I110 Hypertensive heart disease with heart failure: Secondary | ICD-10-CM | POA: Diagnosis not present

## 2019-01-07 DIAGNOSIS — M25512 Pain in left shoulder: Secondary | ICD-10-CM | POA: Diagnosis not present

## 2019-01-07 DIAGNOSIS — G9341 Metabolic encephalopathy: Secondary | ICD-10-CM | POA: Diagnosis not present

## 2019-01-07 DIAGNOSIS — T40601D Poisoning by unspecified narcotics, accidental (unintentional), subsequent encounter: Secondary | ICD-10-CM | POA: Diagnosis not present

## 2019-01-07 DIAGNOSIS — I5033 Acute on chronic diastolic (congestive) heart failure: Secondary | ICD-10-CM | POA: Diagnosis not present

## 2019-01-08 ENCOUNTER — Other Ambulatory Visit: Payer: Self-pay

## 2019-01-08 DIAGNOSIS — T40601D Poisoning by unspecified narcotics, accidental (unintentional), subsequent encounter: Secondary | ICD-10-CM | POA: Diagnosis not present

## 2019-01-08 DIAGNOSIS — I110 Hypertensive heart disease with heart failure: Secondary | ICD-10-CM | POA: Diagnosis not present

## 2019-01-08 DIAGNOSIS — G9341 Metabolic encephalopathy: Secondary | ICD-10-CM | POA: Diagnosis not present

## 2019-01-08 DIAGNOSIS — F0390 Unspecified dementia without behavioral disturbance: Secondary | ICD-10-CM | POA: Diagnosis not present

## 2019-01-08 DIAGNOSIS — I5033 Acute on chronic diastolic (congestive) heart failure: Secondary | ICD-10-CM | POA: Diagnosis not present

## 2019-01-08 DIAGNOSIS — J9601 Acute respiratory failure with hypoxia: Secondary | ICD-10-CM | POA: Diagnosis not present

## 2019-01-08 NOTE — Patient Outreach (Signed)
Transition of care:  Placed call to patient who answered and reports that he is doing well. Reports breathing is good. States very little swelling. States he has followed up with MD. Denies any new problems or concerns today.  PLAN: Follow up in 1 week.  Tomasa Rand, RN, BSN, CEN [facility] [employer] [phone]

## 2019-01-09 DIAGNOSIS — T40601D Poisoning by unspecified narcotics, accidental (unintentional), subsequent encounter: Secondary | ICD-10-CM | POA: Diagnosis not present

## 2019-01-09 DIAGNOSIS — I110 Hypertensive heart disease with heart failure: Secondary | ICD-10-CM | POA: Diagnosis not present

## 2019-01-09 DIAGNOSIS — J9601 Acute respiratory failure with hypoxia: Secondary | ICD-10-CM | POA: Diagnosis not present

## 2019-01-09 DIAGNOSIS — I5033 Acute on chronic diastolic (congestive) heart failure: Secondary | ICD-10-CM | POA: Diagnosis not present

## 2019-01-09 DIAGNOSIS — G9341 Metabolic encephalopathy: Secondary | ICD-10-CM | POA: Diagnosis not present

## 2019-01-09 DIAGNOSIS — F0390 Unspecified dementia without behavioral disturbance: Secondary | ICD-10-CM | POA: Diagnosis not present

## 2019-01-12 DIAGNOSIS — M25512 Pain in left shoulder: Secondary | ICD-10-CM | POA: Insufficient documentation

## 2019-01-12 DIAGNOSIS — M75102 Unspecified rotator cuff tear or rupture of left shoulder, not specified as traumatic: Secondary | ICD-10-CM

## 2019-01-12 HISTORY — DX: Pain in left shoulder: M25.512

## 2019-01-12 HISTORY — DX: Unspecified rotator cuff tear or rupture of left shoulder, not specified as traumatic: M75.102

## 2019-01-13 DIAGNOSIS — G9341 Metabolic encephalopathy: Secondary | ICD-10-CM | POA: Diagnosis not present

## 2019-01-13 DIAGNOSIS — J9601 Acute respiratory failure with hypoxia: Secondary | ICD-10-CM | POA: Diagnosis not present

## 2019-01-13 DIAGNOSIS — I110 Hypertensive heart disease with heart failure: Secondary | ICD-10-CM | POA: Diagnosis not present

## 2019-01-13 DIAGNOSIS — T40601D Poisoning by unspecified narcotics, accidental (unintentional), subsequent encounter: Secondary | ICD-10-CM | POA: Diagnosis not present

## 2019-01-13 DIAGNOSIS — I5033 Acute on chronic diastolic (congestive) heart failure: Secondary | ICD-10-CM | POA: Diagnosis not present

## 2019-01-13 DIAGNOSIS — F0390 Unspecified dementia without behavioral disturbance: Secondary | ICD-10-CM | POA: Diagnosis not present

## 2019-01-14 DIAGNOSIS — T40601D Poisoning by unspecified narcotics, accidental (unintentional), subsequent encounter: Secondary | ICD-10-CM | POA: Diagnosis not present

## 2019-01-14 DIAGNOSIS — G9341 Metabolic encephalopathy: Secondary | ICD-10-CM | POA: Diagnosis not present

## 2019-01-14 DIAGNOSIS — I110 Hypertensive heart disease with heart failure: Secondary | ICD-10-CM | POA: Diagnosis not present

## 2019-01-14 DIAGNOSIS — F0390 Unspecified dementia without behavioral disturbance: Secondary | ICD-10-CM | POA: Diagnosis not present

## 2019-01-14 DIAGNOSIS — J9601 Acute respiratory failure with hypoxia: Secondary | ICD-10-CM | POA: Diagnosis not present

## 2019-01-14 DIAGNOSIS — I5033 Acute on chronic diastolic (congestive) heart failure: Secondary | ICD-10-CM | POA: Diagnosis not present

## 2019-01-15 ENCOUNTER — Other Ambulatory Visit: Payer: Self-pay

## 2019-01-16 DIAGNOSIS — I5033 Acute on chronic diastolic (congestive) heart failure: Secondary | ICD-10-CM | POA: Diagnosis not present

## 2019-01-16 DIAGNOSIS — F0390 Unspecified dementia without behavioral disturbance: Secondary | ICD-10-CM | POA: Diagnosis not present

## 2019-01-16 DIAGNOSIS — T40601D Poisoning by unspecified narcotics, accidental (unintentional), subsequent encounter: Secondary | ICD-10-CM | POA: Diagnosis not present

## 2019-01-16 DIAGNOSIS — G9341 Metabolic encephalopathy: Secondary | ICD-10-CM | POA: Diagnosis not present

## 2019-01-16 DIAGNOSIS — J9601 Acute respiratory failure with hypoxia: Secondary | ICD-10-CM | POA: Diagnosis not present

## 2019-01-16 DIAGNOSIS — I110 Hypertensive heart disease with heart failure: Secondary | ICD-10-CM | POA: Diagnosis not present

## 2019-01-16 NOTE — Patient Outreach (Signed)
Transition of care:  Placed call to patient who answered and reports that he is doing well. Reports no problems with breathing. Reports slight swelling. Reports pain in the same. States that he has a torn rotator cuff and will see surgeon next week.  Reports otherwise, he is weighing daily and taking his medications as prescribed.  PLAN:will follow up in 1 week via phone.  Tomasa Rand, RN, BSN, CEN [facility] [employer] [phone]

## 2019-01-19 DIAGNOSIS — T40601D Poisoning by unspecified narcotics, accidental (unintentional), subsequent encounter: Secondary | ICD-10-CM | POA: Diagnosis not present

## 2019-01-19 DIAGNOSIS — G9341 Metabolic encephalopathy: Secondary | ICD-10-CM | POA: Diagnosis not present

## 2019-01-19 DIAGNOSIS — J9601 Acute respiratory failure with hypoxia: Secondary | ICD-10-CM | POA: Diagnosis not present

## 2019-01-19 DIAGNOSIS — I110 Hypertensive heart disease with heart failure: Secondary | ICD-10-CM | POA: Diagnosis not present

## 2019-01-19 DIAGNOSIS — I5033 Acute on chronic diastolic (congestive) heart failure: Secondary | ICD-10-CM | POA: Diagnosis not present

## 2019-01-19 DIAGNOSIS — F0390 Unspecified dementia without behavioral disturbance: Secondary | ICD-10-CM | POA: Diagnosis not present

## 2019-01-20 DIAGNOSIS — M25512 Pain in left shoulder: Secondary | ICD-10-CM | POA: Diagnosis not present

## 2019-01-20 DIAGNOSIS — M75102 Unspecified rotator cuff tear or rupture of left shoulder, not specified as traumatic: Secondary | ICD-10-CM | POA: Diagnosis not present

## 2019-01-21 DIAGNOSIS — I110 Hypertensive heart disease with heart failure: Secondary | ICD-10-CM | POA: Diagnosis not present

## 2019-01-21 DIAGNOSIS — I5033 Acute on chronic diastolic (congestive) heart failure: Secondary | ICD-10-CM | POA: Diagnosis not present

## 2019-01-21 DIAGNOSIS — J9601 Acute respiratory failure with hypoxia: Secondary | ICD-10-CM | POA: Diagnosis not present

## 2019-01-21 DIAGNOSIS — F0390 Unspecified dementia without behavioral disturbance: Secondary | ICD-10-CM | POA: Diagnosis not present

## 2019-01-21 DIAGNOSIS — G9341 Metabolic encephalopathy: Secondary | ICD-10-CM | POA: Diagnosis not present

## 2019-01-21 DIAGNOSIS — T40601D Poisoning by unspecified narcotics, accidental (unintentional), subsequent encounter: Secondary | ICD-10-CM | POA: Diagnosis not present

## 2019-01-22 ENCOUNTER — Other Ambulatory Visit: Payer: Self-pay

## 2019-01-22 NOTE — Patient Outreach (Signed)
Transition of care: Placed call to patient who reports that he doing better.  Reports weight is down to 301 pounds.Reports that he continues to weight daily and follow low salt diet.   Reports no problems with breathing right now.  Reports small amounts of swelling.    Patient reports he went to orthopedic and needs a shoulder replacement. Reports that he is going to try to wait as long as possible. Reports that he got a cortisone injection and he is already feeling better.  PLAN: follow up in 1 month.  Tomasa Rand, RN, BSN, CEN [facility] [employer] [phone]

## 2019-01-27 DIAGNOSIS — T40601D Poisoning by unspecified narcotics, accidental (unintentional), subsequent encounter: Secondary | ICD-10-CM | POA: Diagnosis not present

## 2019-01-27 DIAGNOSIS — J9601 Acute respiratory failure with hypoxia: Secondary | ICD-10-CM | POA: Diagnosis not present

## 2019-01-27 DIAGNOSIS — I5033 Acute on chronic diastolic (congestive) heart failure: Secondary | ICD-10-CM | POA: Diagnosis not present

## 2019-01-27 DIAGNOSIS — I110 Hypertensive heart disease with heart failure: Secondary | ICD-10-CM | POA: Diagnosis not present

## 2019-01-27 DIAGNOSIS — G9341 Metabolic encephalopathy: Secondary | ICD-10-CM | POA: Diagnosis not present

## 2019-01-27 DIAGNOSIS — F0390 Unspecified dementia without behavioral disturbance: Secondary | ICD-10-CM | POA: Diagnosis not present

## 2019-01-28 DIAGNOSIS — T40601D Poisoning by unspecified narcotics, accidental (unintentional), subsequent encounter: Secondary | ICD-10-CM | POA: Diagnosis not present

## 2019-01-28 DIAGNOSIS — J9601 Acute respiratory failure with hypoxia: Secondary | ICD-10-CM | POA: Diagnosis not present

## 2019-01-28 DIAGNOSIS — I110 Hypertensive heart disease with heart failure: Secondary | ICD-10-CM | POA: Diagnosis not present

## 2019-01-28 DIAGNOSIS — F0390 Unspecified dementia without behavioral disturbance: Secondary | ICD-10-CM | POA: Diagnosis not present

## 2019-01-28 DIAGNOSIS — I5033 Acute on chronic diastolic (congestive) heart failure: Secondary | ICD-10-CM | POA: Diagnosis not present

## 2019-01-28 DIAGNOSIS — G9341 Metabolic encephalopathy: Secondary | ICD-10-CM | POA: Diagnosis not present

## 2019-02-25 DIAGNOSIS — M25541 Pain in joints of right hand: Secondary | ICD-10-CM | POA: Diagnosis not present

## 2019-02-25 DIAGNOSIS — M25542 Pain in joints of left hand: Secondary | ICD-10-CM | POA: Diagnosis not present

## 2019-02-25 DIAGNOSIS — M25562 Pain in left knee: Secondary | ICD-10-CM | POA: Diagnosis not present

## 2019-02-25 DIAGNOSIS — M25561 Pain in right knee: Secondary | ICD-10-CM | POA: Diagnosis not present

## 2019-03-17 DIAGNOSIS — Z1159 Encounter for screening for other viral diseases: Secondary | ICD-10-CM | POA: Diagnosis not present

## 2019-03-17 DIAGNOSIS — J441 Chronic obstructive pulmonary disease with (acute) exacerbation: Secondary | ICD-10-CM | POA: Diagnosis not present

## 2019-03-17 DIAGNOSIS — J018 Other acute sinusitis: Secondary | ICD-10-CM | POA: Diagnosis not present

## 2019-04-03 DIAGNOSIS — I509 Heart failure, unspecified: Secondary | ICD-10-CM | POA: Diagnosis not present

## 2019-04-03 DIAGNOSIS — R55 Syncope and collapse: Secondary | ICD-10-CM | POA: Insufficient documentation

## 2019-04-03 DIAGNOSIS — I952 Hypotension due to drugs: Secondary | ICD-10-CM | POA: Diagnosis not present

## 2019-04-03 DIAGNOSIS — R0602 Shortness of breath: Secondary | ICD-10-CM | POA: Diagnosis not present

## 2019-04-03 DIAGNOSIS — R531 Weakness: Secondary | ICD-10-CM | POA: Diagnosis not present

## 2019-04-03 DIAGNOSIS — F29 Unspecified psychosis not due to a substance or known physiological condition: Secondary | ICD-10-CM | POA: Diagnosis not present

## 2019-04-03 DIAGNOSIS — I959 Hypotension, unspecified: Secondary | ICD-10-CM | POA: Diagnosis not present

## 2019-04-03 DIAGNOSIS — I10 Essential (primary) hypertension: Secondary | ICD-10-CM | POA: Diagnosis not present

## 2019-04-03 DIAGNOSIS — T426X5A Adverse effect of other antiepileptic and sedative-hypnotic drugs, initial encounter: Secondary | ICD-10-CM | POA: Diagnosis not present

## 2019-04-03 DIAGNOSIS — T402X5A Adverse effect of other opioids, initial encounter: Secondary | ICD-10-CM | POA: Diagnosis not present

## 2019-04-03 DIAGNOSIS — F0150 Vascular dementia without behavioral disturbance: Secondary | ICD-10-CM | POA: Diagnosis not present

## 2019-04-03 DIAGNOSIS — I110 Hypertensive heart disease with heart failure: Secondary | ICD-10-CM | POA: Diagnosis not present

## 2019-04-03 DIAGNOSIS — R4020 Unspecified coma: Secondary | ICD-10-CM | POA: Diagnosis not present

## 2019-04-03 DIAGNOSIS — R001 Bradycardia, unspecified: Secondary | ICD-10-CM | POA: Diagnosis not present

## 2019-04-03 DIAGNOSIS — R579 Shock, unspecified: Secondary | ICD-10-CM | POA: Diagnosis not present

## 2019-04-03 DIAGNOSIS — J449 Chronic obstructive pulmonary disease, unspecified: Secondary | ICD-10-CM | POA: Diagnosis not present

## 2019-04-03 DIAGNOSIS — T447X5A Adverse effect of beta-adrenoreceptor antagonists, initial encounter: Secondary | ICD-10-CM | POA: Diagnosis not present

## 2019-04-03 HISTORY — DX: Syncope and collapse: R55

## 2019-04-04 DIAGNOSIS — R001 Bradycardia, unspecified: Secondary | ICD-10-CM | POA: Diagnosis present

## 2019-04-04 DIAGNOSIS — J9620 Acute and chronic respiratory failure, unspecified whether with hypoxia or hypercapnia: Secondary | ICD-10-CM | POA: Diagnosis not present

## 2019-04-04 DIAGNOSIS — Z8249 Family history of ischemic heart disease and other diseases of the circulatory system: Secondary | ICD-10-CM | POA: Diagnosis not present

## 2019-04-04 DIAGNOSIS — Z9981 Dependence on supplemental oxygen: Secondary | ICD-10-CM | POA: Diagnosis not present

## 2019-04-04 DIAGNOSIS — T426X5A Adverse effect of other antiepileptic and sedative-hypnotic drugs, initial encounter: Secondary | ICD-10-CM | POA: Diagnosis present

## 2019-04-04 DIAGNOSIS — J449 Chronic obstructive pulmonary disease, unspecified: Secondary | ICD-10-CM | POA: Diagnosis not present

## 2019-04-04 DIAGNOSIS — I952 Hypotension due to drugs: Secondary | ICD-10-CM | POA: Diagnosis present

## 2019-04-04 DIAGNOSIS — I509 Heart failure, unspecified: Secondary | ICD-10-CM | POA: Diagnosis not present

## 2019-04-04 DIAGNOSIS — R55 Syncope and collapse: Secondary | ICD-10-CM | POA: Diagnosis not present

## 2019-04-04 DIAGNOSIS — T447X5A Adverse effect of beta-adrenoreceptor antagonists, initial encounter: Secondary | ICD-10-CM | POA: Diagnosis present

## 2019-04-04 DIAGNOSIS — F0150 Vascular dementia without behavioral disturbance: Secondary | ICD-10-CM | POA: Diagnosis present

## 2019-04-04 DIAGNOSIS — E869 Volume depletion, unspecified: Secondary | ICD-10-CM | POA: Diagnosis present

## 2019-04-04 DIAGNOSIS — Z79899 Other long term (current) drug therapy: Secondary | ICD-10-CM | POA: Diagnosis not present

## 2019-04-04 DIAGNOSIS — F0390 Unspecified dementia without behavioral disturbance: Secondary | ICD-10-CM | POA: Diagnosis not present

## 2019-04-04 DIAGNOSIS — I10 Essential (primary) hypertension: Secondary | ICD-10-CM | POA: Diagnosis present

## 2019-04-04 DIAGNOSIS — T402X5A Adverse effect of other opioids, initial encounter: Secondary | ICD-10-CM | POA: Diagnosis present

## 2019-04-06 DIAGNOSIS — I10 Essential (primary) hypertension: Secondary | ICD-10-CM | POA: Diagnosis not present

## 2019-04-06 DIAGNOSIS — J410 Simple chronic bronchitis: Secondary | ICD-10-CM | POA: Diagnosis not present

## 2019-04-06 DIAGNOSIS — D509 Iron deficiency anemia, unspecified: Secondary | ICD-10-CM | POA: Diagnosis not present

## 2019-04-06 DIAGNOSIS — E782 Mixed hyperlipidemia: Secondary | ICD-10-CM | POA: Diagnosis not present

## 2019-04-06 DIAGNOSIS — M545 Low back pain: Secondary | ICD-10-CM | POA: Diagnosis not present

## 2019-04-06 DIAGNOSIS — M0589 Other rheumatoid arthritis with rheumatoid factor of multiple sites: Secondary | ICD-10-CM | POA: Diagnosis not present

## 2019-04-06 DIAGNOSIS — S0012XA Contusion of left eyelid and periocular area, initial encounter: Secondary | ICD-10-CM | POA: Diagnosis not present

## 2019-04-06 DIAGNOSIS — M25512 Pain in left shoulder: Secondary | ICD-10-CM | POA: Diagnosis not present

## 2019-04-06 DIAGNOSIS — R42 Dizziness and giddiness: Secondary | ICD-10-CM | POA: Diagnosis not present

## 2019-04-06 DIAGNOSIS — I950 Idiopathic hypotension: Secondary | ICD-10-CM | POA: Diagnosis not present

## 2019-04-06 DIAGNOSIS — I110 Hypertensive heart disease with heart failure: Secondary | ICD-10-CM | POA: Diagnosis not present

## 2019-04-06 DIAGNOSIS — R7301 Impaired fasting glucose: Secondary | ICD-10-CM | POA: Diagnosis not present

## 2019-04-06 DIAGNOSIS — G894 Chronic pain syndrome: Secondary | ICD-10-CM | POA: Diagnosis not present

## 2019-04-17 ENCOUNTER — Other Ambulatory Visit: Payer: Self-pay

## 2019-04-17 NOTE — Patient Outreach (Signed)
Telephone follow up/ case closure:  Placed call to patient with no answer. Placed call to daughter who reports patient is doing well. Reports breathing is good. Swelling has decreased.  Reports steroid injection helped for a little while to his shoulder. Reports patient has not yet decided about shoulder replacement.  Daughter reported patient had some recent passing out spells and it was found that his heart rate had dropped due to medications, Medications were stopped and now patient is doing well.  Daughter reports close follow up with primary MD.  Daughter denies any other needs at this time.   PLAN: case closure as goals are met.  Tomasa Rand, RN, BSN, CEN [facility] [employer] [phone]

## 2019-07-01 DIAGNOSIS — M25562 Pain in left knee: Secondary | ICD-10-CM | POA: Diagnosis not present

## 2019-07-01 DIAGNOSIS — M19011 Primary osteoarthritis, right shoulder: Secondary | ICD-10-CM | POA: Diagnosis not present

## 2019-07-01 DIAGNOSIS — Z950 Presence of cardiac pacemaker: Secondary | ICD-10-CM | POA: Diagnosis not present

## 2019-07-01 DIAGNOSIS — R4182 Altered mental status, unspecified: Secondary | ICD-10-CM | POA: Diagnosis not present

## 2019-07-01 DIAGNOSIS — M25541 Pain in joints of right hand: Secondary | ICD-10-CM | POA: Diagnosis not present

## 2019-07-01 DIAGNOSIS — M47819 Spondylosis without myelopathy or radiculopathy, site unspecified: Secondary | ICD-10-CM | POA: Diagnosis not present

## 2019-07-01 DIAGNOSIS — N200 Calculus of kidney: Secondary | ICD-10-CM | POA: Diagnosis not present

## 2019-07-01 DIAGNOSIS — I517 Cardiomegaly: Secondary | ICD-10-CM | POA: Diagnosis not present

## 2019-07-01 DIAGNOSIS — M25542 Pain in joints of left hand: Secondary | ICD-10-CM | POA: Diagnosis not present

## 2019-07-01 DIAGNOSIS — R52 Pain, unspecified: Secondary | ICD-10-CM | POA: Diagnosis not present

## 2019-07-01 DIAGNOSIS — R531 Weakness: Secondary | ICD-10-CM | POA: Diagnosis not present

## 2019-07-01 DIAGNOSIS — M19012 Primary osteoarthritis, left shoulder: Secondary | ICD-10-CM | POA: Diagnosis not present

## 2019-07-01 DIAGNOSIS — M25561 Pain in right knee: Secondary | ICD-10-CM | POA: Diagnosis not present

## 2019-07-01 DIAGNOSIS — K8020 Calculus of gallbladder without cholecystitis without obstruction: Secondary | ICD-10-CM | POA: Diagnosis not present

## 2019-07-02 DIAGNOSIS — N200 Calculus of kidney: Secondary | ICD-10-CM | POA: Diagnosis not present

## 2019-07-02 DIAGNOSIS — K8020 Calculus of gallbladder without cholecystitis without obstruction: Secondary | ICD-10-CM | POA: Diagnosis not present

## 2019-07-04 DIAGNOSIS — F17210 Nicotine dependence, cigarettes, uncomplicated: Secondary | ICD-10-CM | POA: Diagnosis not present

## 2019-07-04 DIAGNOSIS — J449 Chronic obstructive pulmonary disease, unspecified: Secondary | ICD-10-CM | POA: Diagnosis not present

## 2019-07-04 DIAGNOSIS — Z7982 Long term (current) use of aspirin: Secondary | ICD-10-CM | POA: Diagnosis not present

## 2019-07-04 DIAGNOSIS — F329 Major depressive disorder, single episode, unspecified: Secondary | ICD-10-CM | POA: Diagnosis not present

## 2019-07-04 DIAGNOSIS — G308 Other Alzheimer's disease: Secondary | ICD-10-CM | POA: Diagnosis not present

## 2019-07-04 DIAGNOSIS — E782 Mixed hyperlipidemia: Secondary | ICD-10-CM | POA: Diagnosis not present

## 2019-07-04 DIAGNOSIS — Z9049 Acquired absence of other specified parts of digestive tract: Secondary | ICD-10-CM | POA: Diagnosis not present

## 2019-07-04 DIAGNOSIS — Z79899 Other long term (current) drug therapy: Secondary | ICD-10-CM | POA: Diagnosis not present

## 2019-07-04 DIAGNOSIS — Z6841 Body Mass Index (BMI) 40.0 and over, adult: Secondary | ICD-10-CM | POA: Diagnosis not present

## 2019-07-04 DIAGNOSIS — G8929 Other chronic pain: Secondary | ICD-10-CM | POA: Diagnosis not present

## 2019-07-04 DIAGNOSIS — M069 Rheumatoid arthritis, unspecified: Secondary | ICD-10-CM | POA: Diagnosis not present

## 2019-07-04 DIAGNOSIS — Z9181 History of falling: Secondary | ICD-10-CM | POA: Diagnosis not present

## 2019-07-04 DIAGNOSIS — F0280 Dementia in other diseases classified elsewhere without behavioral disturbance: Secondary | ICD-10-CM | POA: Diagnosis not present

## 2019-07-04 DIAGNOSIS — K219 Gastro-esophageal reflux disease without esophagitis: Secondary | ICD-10-CM | POA: Diagnosis not present

## 2019-07-04 DIAGNOSIS — I5042 Chronic combined systolic (congestive) and diastolic (congestive) heart failure: Secondary | ICD-10-CM | POA: Diagnosis not present

## 2019-07-04 DIAGNOSIS — Z79891 Long term (current) use of opiate analgesic: Secondary | ICD-10-CM | POA: Diagnosis not present

## 2019-07-04 DIAGNOSIS — F419 Anxiety disorder, unspecified: Secondary | ICD-10-CM | POA: Diagnosis not present

## 2019-07-04 DIAGNOSIS — I110 Hypertensive heart disease with heart failure: Secondary | ICD-10-CM | POA: Diagnosis not present

## 2019-07-07 DIAGNOSIS — F329 Major depressive disorder, single episode, unspecified: Secondary | ICD-10-CM | POA: Diagnosis not present

## 2019-07-07 DIAGNOSIS — I110 Hypertensive heart disease with heart failure: Secondary | ICD-10-CM | POA: Diagnosis not present

## 2019-07-07 DIAGNOSIS — J449 Chronic obstructive pulmonary disease, unspecified: Secondary | ICD-10-CM | POA: Diagnosis not present

## 2019-07-07 DIAGNOSIS — G308 Other Alzheimer's disease: Secondary | ICD-10-CM | POA: Diagnosis not present

## 2019-07-07 DIAGNOSIS — I5042 Chronic combined systolic (congestive) and diastolic (congestive) heart failure: Secondary | ICD-10-CM | POA: Diagnosis not present

## 2019-07-08 DIAGNOSIS — I5042 Chronic combined systolic (congestive) and diastolic (congestive) heart failure: Secondary | ICD-10-CM | POA: Diagnosis not present

## 2019-07-08 DIAGNOSIS — F329 Major depressive disorder, single episode, unspecified: Secondary | ICD-10-CM | POA: Diagnosis not present

## 2019-07-08 DIAGNOSIS — J449 Chronic obstructive pulmonary disease, unspecified: Secondary | ICD-10-CM | POA: Diagnosis not present

## 2019-07-08 DIAGNOSIS — I110 Hypertensive heart disease with heart failure: Secondary | ICD-10-CM | POA: Diagnosis not present

## 2019-07-08 DIAGNOSIS — G308 Other Alzheimer's disease: Secondary | ICD-10-CM | POA: Diagnosis not present

## 2019-07-09 DIAGNOSIS — G308 Other Alzheimer's disease: Secondary | ICD-10-CM | POA: Diagnosis not present

## 2019-07-09 DIAGNOSIS — I110 Hypertensive heart disease with heart failure: Secondary | ICD-10-CM | POA: Diagnosis not present

## 2019-07-09 DIAGNOSIS — F329 Major depressive disorder, single episode, unspecified: Secondary | ICD-10-CM | POA: Diagnosis not present

## 2019-07-09 DIAGNOSIS — J449 Chronic obstructive pulmonary disease, unspecified: Secondary | ICD-10-CM | POA: Diagnosis not present

## 2019-07-09 DIAGNOSIS — I5042 Chronic combined systolic (congestive) and diastolic (congestive) heart failure: Secondary | ICD-10-CM | POA: Diagnosis not present

## 2019-07-10 DIAGNOSIS — I110 Hypertensive heart disease with heart failure: Secondary | ICD-10-CM | POA: Diagnosis not present

## 2019-07-10 DIAGNOSIS — J449 Chronic obstructive pulmonary disease, unspecified: Secondary | ICD-10-CM | POA: Diagnosis not present

## 2019-07-10 DIAGNOSIS — G308 Other Alzheimer's disease: Secondary | ICD-10-CM | POA: Diagnosis not present

## 2019-07-10 DIAGNOSIS — I5042 Chronic combined systolic (congestive) and diastolic (congestive) heart failure: Secondary | ICD-10-CM | POA: Diagnosis not present

## 2019-07-10 DIAGNOSIS — F329 Major depressive disorder, single episode, unspecified: Secondary | ICD-10-CM | POA: Diagnosis not present

## 2019-07-14 DIAGNOSIS — J449 Chronic obstructive pulmonary disease, unspecified: Secondary | ICD-10-CM | POA: Diagnosis not present

## 2019-07-14 DIAGNOSIS — I5042 Chronic combined systolic (congestive) and diastolic (congestive) heart failure: Secondary | ICD-10-CM | POA: Diagnosis not present

## 2019-07-14 DIAGNOSIS — G308 Other Alzheimer's disease: Secondary | ICD-10-CM | POA: Diagnosis not present

## 2019-07-14 DIAGNOSIS — F329 Major depressive disorder, single episode, unspecified: Secondary | ICD-10-CM | POA: Diagnosis not present

## 2019-07-14 DIAGNOSIS — I110 Hypertensive heart disease with heart failure: Secondary | ICD-10-CM | POA: Diagnosis not present

## 2019-07-15 DIAGNOSIS — M0589 Other rheumatoid arthritis with rheumatoid factor of multiple sites: Secondary | ICD-10-CM | POA: Diagnosis not present

## 2019-07-15 DIAGNOSIS — G894 Chronic pain syndrome: Secondary | ICD-10-CM | POA: Diagnosis not present

## 2019-07-15 DIAGNOSIS — R7301 Impaired fasting glucose: Secondary | ICD-10-CM | POA: Diagnosis not present

## 2019-07-15 DIAGNOSIS — Z23 Encounter for immunization: Secondary | ICD-10-CM | POA: Diagnosis not present

## 2019-07-15 DIAGNOSIS — J9611 Chronic respiratory failure with hypoxia: Secondary | ICD-10-CM | POA: Diagnosis not present

## 2019-07-15 DIAGNOSIS — E782 Mixed hyperlipidemia: Secondary | ICD-10-CM | POA: Diagnosis not present

## 2019-07-15 DIAGNOSIS — M1009 Idiopathic gout, multiple sites: Secondary | ICD-10-CM | POA: Diagnosis not present

## 2019-07-15 DIAGNOSIS — F17219 Nicotine dependence, cigarettes, with unspecified nicotine-induced disorders: Secondary | ICD-10-CM | POA: Diagnosis not present

## 2019-07-15 DIAGNOSIS — R42 Dizziness and giddiness: Secondary | ICD-10-CM | POA: Diagnosis not present

## 2019-07-15 DIAGNOSIS — I10 Essential (primary) hypertension: Secondary | ICD-10-CM | POA: Diagnosis not present

## 2019-07-15 DIAGNOSIS — J410 Simple chronic bronchitis: Secondary | ICD-10-CM | POA: Diagnosis not present

## 2019-07-15 DIAGNOSIS — I110 Hypertensive heart disease with heart failure: Secondary | ICD-10-CM | POA: Diagnosis not present

## 2019-07-15 DIAGNOSIS — I5042 Chronic combined systolic (congestive) and diastolic (congestive) heart failure: Secondary | ICD-10-CM | POA: Diagnosis not present

## 2019-07-15 DIAGNOSIS — J449 Chronic obstructive pulmonary disease, unspecified: Secondary | ICD-10-CM | POA: Diagnosis not present

## 2019-07-20 DIAGNOSIS — I110 Hypertensive heart disease with heart failure: Secondary | ICD-10-CM | POA: Diagnosis not present

## 2019-07-20 DIAGNOSIS — G308 Other Alzheimer's disease: Secondary | ICD-10-CM | POA: Diagnosis not present

## 2019-07-20 DIAGNOSIS — J449 Chronic obstructive pulmonary disease, unspecified: Secondary | ICD-10-CM | POA: Diagnosis not present

## 2019-07-20 DIAGNOSIS — F329 Major depressive disorder, single episode, unspecified: Secondary | ICD-10-CM | POA: Diagnosis not present

## 2019-07-20 DIAGNOSIS — I5042 Chronic combined systolic (congestive) and diastolic (congestive) heart failure: Secondary | ICD-10-CM | POA: Diagnosis not present

## 2019-07-22 DIAGNOSIS — G308 Other Alzheimer's disease: Secondary | ICD-10-CM | POA: Diagnosis not present

## 2019-07-22 DIAGNOSIS — R1084 Generalized abdominal pain: Secondary | ICD-10-CM | POA: Diagnosis not present

## 2019-07-22 DIAGNOSIS — I5042 Chronic combined systolic (congestive) and diastolic (congestive) heart failure: Secondary | ICD-10-CM | POA: Diagnosis not present

## 2019-07-22 DIAGNOSIS — J449 Chronic obstructive pulmonary disease, unspecified: Secondary | ICD-10-CM | POA: Diagnosis not present

## 2019-07-22 DIAGNOSIS — I110 Hypertensive heart disease with heart failure: Secondary | ICD-10-CM | POA: Diagnosis not present

## 2019-07-22 DIAGNOSIS — F329 Major depressive disorder, single episode, unspecified: Secondary | ICD-10-CM | POA: Diagnosis not present

## 2019-07-23 DIAGNOSIS — R091 Pleurisy: Secondary | ICD-10-CM | POA: Diagnosis not present

## 2019-07-23 DIAGNOSIS — M545 Low back pain: Secondary | ICD-10-CM | POA: Diagnosis not present

## 2019-07-23 DIAGNOSIS — R10811 Right upper quadrant abdominal tenderness: Secondary | ICD-10-CM | POA: Diagnosis not present

## 2019-07-23 DIAGNOSIS — R10813 Right lower quadrant abdominal tenderness: Secondary | ICD-10-CM | POA: Diagnosis not present

## 2019-07-29 DIAGNOSIS — I110 Hypertensive heart disease with heart failure: Secondary | ICD-10-CM | POA: Diagnosis not present

## 2019-07-29 DIAGNOSIS — G308 Other Alzheimer's disease: Secondary | ICD-10-CM | POA: Diagnosis not present

## 2019-07-29 DIAGNOSIS — J449 Chronic obstructive pulmonary disease, unspecified: Secondary | ICD-10-CM | POA: Diagnosis not present

## 2019-07-29 DIAGNOSIS — I5042 Chronic combined systolic (congestive) and diastolic (congestive) heart failure: Secondary | ICD-10-CM | POA: Diagnosis not present

## 2019-07-29 DIAGNOSIS — F329 Major depressive disorder, single episode, unspecified: Secondary | ICD-10-CM | POA: Diagnosis not present

## 2019-08-03 DIAGNOSIS — M069 Rheumatoid arthritis, unspecified: Secondary | ICD-10-CM | POA: Diagnosis not present

## 2019-08-03 DIAGNOSIS — E782 Mixed hyperlipidemia: Secondary | ICD-10-CM | POA: Diagnosis not present

## 2019-08-03 DIAGNOSIS — F0280 Dementia in other diseases classified elsewhere without behavioral disturbance: Secondary | ICD-10-CM | POA: Diagnosis not present

## 2019-08-03 DIAGNOSIS — K219 Gastro-esophageal reflux disease without esophagitis: Secondary | ICD-10-CM | POA: Diagnosis not present

## 2019-08-03 DIAGNOSIS — G8929 Other chronic pain: Secondary | ICD-10-CM | POA: Diagnosis not present

## 2019-08-03 DIAGNOSIS — F419 Anxiety disorder, unspecified: Secondary | ICD-10-CM | POA: Diagnosis not present

## 2019-08-03 DIAGNOSIS — Z79891 Long term (current) use of opiate analgesic: Secondary | ICD-10-CM | POA: Diagnosis not present

## 2019-08-03 DIAGNOSIS — Z79899 Other long term (current) drug therapy: Secondary | ICD-10-CM | POA: Diagnosis not present

## 2019-08-03 DIAGNOSIS — F329 Major depressive disorder, single episode, unspecified: Secondary | ICD-10-CM | POA: Diagnosis not present

## 2019-08-03 DIAGNOSIS — J449 Chronic obstructive pulmonary disease, unspecified: Secondary | ICD-10-CM | POA: Diagnosis not present

## 2019-08-03 DIAGNOSIS — G308 Other Alzheimer's disease: Secondary | ICD-10-CM | POA: Diagnosis not present

## 2019-08-03 DIAGNOSIS — Z9181 History of falling: Secondary | ICD-10-CM | POA: Diagnosis not present

## 2019-08-03 DIAGNOSIS — F17210 Nicotine dependence, cigarettes, uncomplicated: Secondary | ICD-10-CM | POA: Diagnosis not present

## 2019-08-03 DIAGNOSIS — I5042 Chronic combined systolic (congestive) and diastolic (congestive) heart failure: Secondary | ICD-10-CM | POA: Diagnosis not present

## 2019-08-03 DIAGNOSIS — I110 Hypertensive heart disease with heart failure: Secondary | ICD-10-CM | POA: Diagnosis not present

## 2019-08-03 DIAGNOSIS — Z9049 Acquired absence of other specified parts of digestive tract: Secondary | ICD-10-CM | POA: Diagnosis not present

## 2019-08-03 DIAGNOSIS — Z6841 Body Mass Index (BMI) 40.0 and over, adult: Secondary | ICD-10-CM | POA: Diagnosis not present

## 2019-08-03 DIAGNOSIS — Z7982 Long term (current) use of aspirin: Secondary | ICD-10-CM | POA: Diagnosis not present

## 2019-08-05 DIAGNOSIS — F329 Major depressive disorder, single episode, unspecified: Secondary | ICD-10-CM | POA: Diagnosis not present

## 2019-08-05 DIAGNOSIS — G308 Other Alzheimer's disease: Secondary | ICD-10-CM | POA: Diagnosis not present

## 2019-08-05 DIAGNOSIS — I5042 Chronic combined systolic (congestive) and diastolic (congestive) heart failure: Secondary | ICD-10-CM | POA: Diagnosis not present

## 2019-08-05 DIAGNOSIS — J449 Chronic obstructive pulmonary disease, unspecified: Secondary | ICD-10-CM | POA: Diagnosis not present

## 2019-08-05 DIAGNOSIS — I110 Hypertensive heart disease with heart failure: Secondary | ICD-10-CM | POA: Diagnosis not present

## 2019-08-05 DIAGNOSIS — F0280 Dementia in other diseases classified elsewhere without behavioral disturbance: Secondary | ICD-10-CM | POA: Diagnosis not present

## 2019-08-17 DIAGNOSIS — M10041 Idiopathic gout, right hand: Secondary | ICD-10-CM | POA: Diagnosis not present

## 2019-08-17 DIAGNOSIS — E782 Mixed hyperlipidemia: Secondary | ICD-10-CM | POA: Diagnosis not present

## 2019-08-17 DIAGNOSIS — J410 Simple chronic bronchitis: Secondary | ICD-10-CM | POA: Diagnosis not present

## 2019-08-17 DIAGNOSIS — J9611 Chronic respiratory failure with hypoxia: Secondary | ICD-10-CM | POA: Diagnosis not present

## 2019-08-17 DIAGNOSIS — I110 Hypertensive heart disease with heart failure: Secondary | ICD-10-CM | POA: Diagnosis not present

## 2019-08-17 DIAGNOSIS — M48061 Spinal stenosis, lumbar region without neurogenic claudication: Secondary | ICD-10-CM | POA: Diagnosis not present

## 2019-08-17 DIAGNOSIS — G894 Chronic pain syndrome: Secondary | ICD-10-CM | POA: Diagnosis not present

## 2019-08-20 DIAGNOSIS — I110 Hypertensive heart disease with heart failure: Secondary | ICD-10-CM | POA: Diagnosis not present

## 2019-08-20 DIAGNOSIS — I5042 Chronic combined systolic (congestive) and diastolic (congestive) heart failure: Secondary | ICD-10-CM | POA: Diagnosis not present

## 2019-08-20 DIAGNOSIS — F329 Major depressive disorder, single episode, unspecified: Secondary | ICD-10-CM | POA: Diagnosis not present

## 2019-08-20 DIAGNOSIS — F0280 Dementia in other diseases classified elsewhere without behavioral disturbance: Secondary | ICD-10-CM | POA: Diagnosis not present

## 2019-08-20 DIAGNOSIS — G308 Other Alzheimer's disease: Secondary | ICD-10-CM | POA: Diagnosis not present

## 2019-08-22 DIAGNOSIS — R062 Wheezing: Secondary | ICD-10-CM | POA: Diagnosis not present

## 2019-08-22 DIAGNOSIS — I214 Non-ST elevation (NSTEMI) myocardial infarction: Secondary | ICD-10-CM | POA: Diagnosis not present

## 2019-08-22 DIAGNOSIS — J189 Pneumonia, unspecified organism: Secondary | ICD-10-CM | POA: Diagnosis not present

## 2019-08-22 DIAGNOSIS — R069 Unspecified abnormalities of breathing: Secondary | ICD-10-CM | POA: Diagnosis not present

## 2019-08-22 DIAGNOSIS — J9601 Acute respiratory failure with hypoxia: Secondary | ICD-10-CM | POA: Diagnosis not present

## 2019-08-22 DIAGNOSIS — Z20828 Contact with and (suspected) exposure to other viral communicable diseases: Secondary | ICD-10-CM | POA: Diagnosis not present

## 2019-08-22 DIAGNOSIS — J80 Acute respiratory distress syndrome: Secondary | ICD-10-CM | POA: Diagnosis not present

## 2019-08-22 DIAGNOSIS — R0602 Shortness of breath: Secondary | ICD-10-CM | POA: Diagnosis not present

## 2019-08-23 DIAGNOSIS — I5043 Acute on chronic combined systolic (congestive) and diastolic (congestive) heart failure: Secondary | ICD-10-CM | POA: Diagnosis present

## 2019-08-23 DIAGNOSIS — F0390 Unspecified dementia without behavioral disturbance: Secondary | ICD-10-CM | POA: Diagnosis present

## 2019-08-23 DIAGNOSIS — I509 Heart failure, unspecified: Secondary | ICD-10-CM | POA: Diagnosis not present

## 2019-08-23 DIAGNOSIS — J9601 Acute respiratory failure with hypoxia: Secondary | ICD-10-CM | POA: Diagnosis not present

## 2019-08-23 DIAGNOSIS — G4733 Obstructive sleep apnea (adult) (pediatric): Secondary | ICD-10-CM | POA: Diagnosis present

## 2019-08-23 DIAGNOSIS — Z9989 Dependence on other enabling machines and devices: Secondary | ICD-10-CM | POA: Diagnosis not present

## 2019-08-23 DIAGNOSIS — J9692 Respiratory failure, unspecified with hypercapnia: Secondary | ICD-10-CM | POA: Diagnosis not present

## 2019-08-23 DIAGNOSIS — J9622 Acute and chronic respiratory failure with hypercapnia: Secondary | ICD-10-CM | POA: Diagnosis not present

## 2019-08-23 DIAGNOSIS — F329 Major depressive disorder, single episode, unspecified: Secondary | ICD-10-CM | POA: Diagnosis present

## 2019-08-23 DIAGNOSIS — A419 Sepsis, unspecified organism: Secondary | ICD-10-CM | POA: Diagnosis not present

## 2019-08-23 DIAGNOSIS — G8929 Other chronic pain: Secondary | ICD-10-CM | POA: Diagnosis present

## 2019-08-23 DIAGNOSIS — J440 Chronic obstructive pulmonary disease with acute lower respiratory infection: Secondary | ICD-10-CM | POA: Diagnosis present

## 2019-08-23 DIAGNOSIS — I517 Cardiomegaly: Secondary | ICD-10-CM | POA: Diagnosis not present

## 2019-08-23 DIAGNOSIS — E662 Morbid (severe) obesity with alveolar hypoventilation: Secondary | ICD-10-CM | POA: Diagnosis present

## 2019-08-23 DIAGNOSIS — J811 Chronic pulmonary edema: Secondary | ICD-10-CM | POA: Diagnosis not present

## 2019-08-23 DIAGNOSIS — G2581 Restless legs syndrome: Secondary | ICD-10-CM | POA: Diagnosis present

## 2019-08-23 DIAGNOSIS — Z9181 History of falling: Secondary | ICD-10-CM | POA: Diagnosis not present

## 2019-08-23 DIAGNOSIS — I110 Hypertensive heart disease with heart failure: Secondary | ICD-10-CM | POA: Diagnosis present

## 2019-08-23 DIAGNOSIS — Z9114 Patient's other noncompliance with medication regimen: Secondary | ICD-10-CM | POA: Diagnosis not present

## 2019-08-23 DIAGNOSIS — Z79899 Other long term (current) drug therapy: Secondary | ICD-10-CM | POA: Diagnosis not present

## 2019-08-23 DIAGNOSIS — J441 Chronic obstructive pulmonary disease with (acute) exacerbation: Secondary | ICD-10-CM | POA: Diagnosis not present

## 2019-08-23 DIAGNOSIS — R5381 Other malaise: Secondary | ICD-10-CM | POA: Diagnosis present

## 2019-08-23 DIAGNOSIS — E876 Hypokalemia: Secondary | ICD-10-CM | POA: Diagnosis present

## 2019-08-23 DIAGNOSIS — I5041 Acute combined systolic (congestive) and diastolic (congestive) heart failure: Secondary | ICD-10-CM | POA: Diagnosis not present

## 2019-08-23 DIAGNOSIS — R7989 Other specified abnormal findings of blood chemistry: Secondary | ICD-10-CM | POA: Diagnosis not present

## 2019-08-23 DIAGNOSIS — I214 Non-ST elevation (NSTEMI) myocardial infarction: Secondary | ICD-10-CM | POA: Diagnosis not present

## 2019-08-23 DIAGNOSIS — J189 Pneumonia, unspecified organism: Secondary | ICD-10-CM | POA: Diagnosis not present

## 2019-08-23 DIAGNOSIS — Z20828 Contact with and (suspected) exposure to other viral communicable diseases: Secondary | ICD-10-CM | POA: Diagnosis not present

## 2019-08-23 DIAGNOSIS — Z9981 Dependence on supplemental oxygen: Secondary | ICD-10-CM | POA: Diagnosis not present

## 2019-08-23 DIAGNOSIS — J449 Chronic obstructive pulmonary disease, unspecified: Secondary | ICD-10-CM | POA: Diagnosis present

## 2019-08-23 DIAGNOSIS — E785 Hyperlipidemia, unspecified: Secondary | ICD-10-CM | POA: Diagnosis present

## 2019-08-23 DIAGNOSIS — J9691 Respiratory failure, unspecified with hypoxia: Secondary | ICD-10-CM | POA: Diagnosis not present

## 2019-08-23 DIAGNOSIS — I10 Essential (primary) hypertension: Secondary | ICD-10-CM | POA: Diagnosis not present

## 2019-08-23 DIAGNOSIS — R0602 Shortness of breath: Secondary | ICD-10-CM | POA: Diagnosis not present

## 2019-08-23 DIAGNOSIS — I119 Hypertensive heart disease without heart failure: Secondary | ICD-10-CM | POA: Diagnosis not present

## 2019-08-23 DIAGNOSIS — R6521 Severe sepsis with septic shock: Secondary | ICD-10-CM | POA: Diagnosis not present

## 2019-08-24 DIAGNOSIS — I517 Cardiomegaly: Secondary | ICD-10-CM

## 2019-08-24 DIAGNOSIS — G4733 Obstructive sleep apnea (adult) (pediatric): Secondary | ICD-10-CM

## 2019-08-24 DIAGNOSIS — I5041 Acute combined systolic (congestive) and diastolic (congestive) heart failure: Secondary | ICD-10-CM

## 2019-08-24 DIAGNOSIS — I119 Hypertensive heart disease without heart failure: Secondary | ICD-10-CM

## 2019-08-24 DIAGNOSIS — R7989 Other specified abnormal findings of blood chemistry: Secondary | ICD-10-CM

## 2019-08-24 DIAGNOSIS — J441 Chronic obstructive pulmonary disease with (acute) exacerbation: Secondary | ICD-10-CM

## 2019-08-24 DIAGNOSIS — F0390 Unspecified dementia without behavioral disturbance: Secondary | ICD-10-CM

## 2019-08-24 DIAGNOSIS — I509 Heart failure, unspecified: Secondary | ICD-10-CM | POA: Diagnosis not present

## 2019-08-24 DIAGNOSIS — E785 Hyperlipidemia, unspecified: Secondary | ICD-10-CM

## 2019-08-24 DIAGNOSIS — J9692 Respiratory failure, unspecified with hypercapnia: Secondary | ICD-10-CM

## 2019-08-25 DIAGNOSIS — I119 Hypertensive heart disease without heart failure: Secondary | ICD-10-CM | POA: Diagnosis not present

## 2019-08-25 DIAGNOSIS — E785 Hyperlipidemia, unspecified: Secondary | ICD-10-CM | POA: Diagnosis not present

## 2019-08-25 DIAGNOSIS — F0390 Unspecified dementia without behavioral disturbance: Secondary | ICD-10-CM | POA: Diagnosis not present

## 2019-08-25 DIAGNOSIS — J9692 Respiratory failure, unspecified with hypercapnia: Secondary | ICD-10-CM | POA: Diagnosis not present

## 2019-08-25 DIAGNOSIS — G4733 Obstructive sleep apnea (adult) (pediatric): Secondary | ICD-10-CM | POA: Diagnosis not present

## 2019-08-25 DIAGNOSIS — I509 Heart failure, unspecified: Secondary | ICD-10-CM | POA: Diagnosis not present

## 2019-08-25 DIAGNOSIS — I5041 Acute combined systolic (congestive) and diastolic (congestive) heart failure: Secondary | ICD-10-CM | POA: Diagnosis not present

## 2019-08-25 DIAGNOSIS — R7989 Other specified abnormal findings of blood chemistry: Secondary | ICD-10-CM | POA: Diagnosis not present

## 2019-08-26 DIAGNOSIS — R7989 Other specified abnormal findings of blood chemistry: Secondary | ICD-10-CM | POA: Diagnosis not present

## 2019-08-26 DIAGNOSIS — I5041 Acute combined systolic (congestive) and diastolic (congestive) heart failure: Secondary | ICD-10-CM | POA: Diagnosis not present

## 2019-08-26 DIAGNOSIS — E785 Hyperlipidemia, unspecified: Secondary | ICD-10-CM | POA: Diagnosis not present

## 2019-08-26 DIAGNOSIS — I509 Heart failure, unspecified: Secondary | ICD-10-CM | POA: Diagnosis not present

## 2019-08-26 DIAGNOSIS — I119 Hypertensive heart disease without heart failure: Secondary | ICD-10-CM | POA: Diagnosis not present

## 2019-08-26 DIAGNOSIS — J9692 Respiratory failure, unspecified with hypercapnia: Secondary | ICD-10-CM | POA: Diagnosis not present

## 2019-08-26 DIAGNOSIS — F0390 Unspecified dementia without behavioral disturbance: Secondary | ICD-10-CM | POA: Diagnosis not present

## 2019-08-26 DIAGNOSIS — G4733 Obstructive sleep apnea (adult) (pediatric): Secondary | ICD-10-CM | POA: Diagnosis not present

## 2019-08-27 DIAGNOSIS — G4733 Obstructive sleep apnea (adult) (pediatric): Secondary | ICD-10-CM | POA: Diagnosis not present

## 2019-08-27 DIAGNOSIS — E785 Hyperlipidemia, unspecified: Secondary | ICD-10-CM | POA: Diagnosis not present

## 2019-08-27 DIAGNOSIS — J9692 Respiratory failure, unspecified with hypercapnia: Secondary | ICD-10-CM | POA: Diagnosis not present

## 2019-08-27 DIAGNOSIS — I5041 Acute combined systolic (congestive) and diastolic (congestive) heart failure: Secondary | ICD-10-CM | POA: Diagnosis not present

## 2019-08-27 DIAGNOSIS — I509 Heart failure, unspecified: Secondary | ICD-10-CM | POA: Diagnosis not present

## 2019-08-27 DIAGNOSIS — I119 Hypertensive heart disease without heart failure: Secondary | ICD-10-CM | POA: Diagnosis not present

## 2019-08-27 DIAGNOSIS — R7989 Other specified abnormal findings of blood chemistry: Secondary | ICD-10-CM | POA: Diagnosis not present

## 2019-08-27 DIAGNOSIS — F0390 Unspecified dementia without behavioral disturbance: Secondary | ICD-10-CM | POA: Diagnosis not present

## 2019-08-28 DIAGNOSIS — F0390 Unspecified dementia without behavioral disturbance: Secondary | ICD-10-CM | POA: Diagnosis not present

## 2019-08-28 DIAGNOSIS — I509 Heart failure, unspecified: Secondary | ICD-10-CM | POA: Diagnosis not present

## 2019-08-28 DIAGNOSIS — R7989 Other specified abnormal findings of blood chemistry: Secondary | ICD-10-CM | POA: Diagnosis not present

## 2019-08-28 DIAGNOSIS — E785 Hyperlipidemia, unspecified: Secondary | ICD-10-CM | POA: Diagnosis not present

## 2019-08-28 DIAGNOSIS — J9692 Respiratory failure, unspecified with hypercapnia: Secondary | ICD-10-CM | POA: Diagnosis not present

## 2019-08-28 DIAGNOSIS — I119 Hypertensive heart disease without heart failure: Secondary | ICD-10-CM | POA: Diagnosis not present

## 2019-08-28 DIAGNOSIS — G4733 Obstructive sleep apnea (adult) (pediatric): Secondary | ICD-10-CM | POA: Diagnosis not present

## 2019-08-28 DIAGNOSIS — I5041 Acute combined systolic (congestive) and diastolic (congestive) heart failure: Secondary | ICD-10-CM | POA: Diagnosis not present

## 2019-08-29 DIAGNOSIS — F0280 Dementia in other diseases classified elsewhere without behavioral disturbance: Secondary | ICD-10-CM | POA: Diagnosis not present

## 2019-08-29 DIAGNOSIS — F329 Major depressive disorder, single episode, unspecified: Secondary | ICD-10-CM | POA: Diagnosis not present

## 2019-08-29 DIAGNOSIS — G308 Other Alzheimer's disease: Secondary | ICD-10-CM | POA: Diagnosis not present

## 2019-08-29 DIAGNOSIS — I110 Hypertensive heart disease with heart failure: Secondary | ICD-10-CM | POA: Diagnosis not present

## 2019-08-29 DIAGNOSIS — I5042 Chronic combined systolic (congestive) and diastolic (congestive) heart failure: Secondary | ICD-10-CM | POA: Diagnosis not present

## 2019-08-31 DIAGNOSIS — I110 Hypertensive heart disease with heart failure: Secondary | ICD-10-CM | POA: Diagnosis not present

## 2019-08-31 DIAGNOSIS — F329 Major depressive disorder, single episode, unspecified: Secondary | ICD-10-CM | POA: Diagnosis not present

## 2019-08-31 DIAGNOSIS — I5042 Chronic combined systolic (congestive) and diastolic (congestive) heart failure: Secondary | ICD-10-CM | POA: Diagnosis not present

## 2019-08-31 DIAGNOSIS — F0280 Dementia in other diseases classified elsewhere without behavioral disturbance: Secondary | ICD-10-CM | POA: Diagnosis not present

## 2019-08-31 DIAGNOSIS — G308 Other Alzheimer's disease: Secondary | ICD-10-CM | POA: Diagnosis not present

## 2019-09-01 DIAGNOSIS — F0280 Dementia in other diseases classified elsewhere without behavioral disturbance: Secondary | ICD-10-CM | POA: Diagnosis not present

## 2019-09-01 DIAGNOSIS — I110 Hypertensive heart disease with heart failure: Secondary | ICD-10-CM | POA: Diagnosis not present

## 2019-09-01 DIAGNOSIS — F329 Major depressive disorder, single episode, unspecified: Secondary | ICD-10-CM | POA: Diagnosis not present

## 2019-09-01 DIAGNOSIS — I5042 Chronic combined systolic (congestive) and diastolic (congestive) heart failure: Secondary | ICD-10-CM | POA: Diagnosis not present

## 2019-09-01 DIAGNOSIS — G308 Other Alzheimer's disease: Secondary | ICD-10-CM | POA: Diagnosis not present

## 2019-09-02 DIAGNOSIS — F329 Major depressive disorder, single episode, unspecified: Secondary | ICD-10-CM | POA: Diagnosis not present

## 2019-09-02 DIAGNOSIS — Z7982 Long term (current) use of aspirin: Secondary | ICD-10-CM | POA: Diagnosis not present

## 2019-09-02 DIAGNOSIS — F0280 Dementia in other diseases classified elsewhere without behavioral disturbance: Secondary | ICD-10-CM | POA: Diagnosis not present

## 2019-09-02 DIAGNOSIS — I252 Old myocardial infarction: Secondary | ICD-10-CM | POA: Diagnosis not present

## 2019-09-02 DIAGNOSIS — G308 Other Alzheimer's disease: Secondary | ICD-10-CM | POA: Diagnosis not present

## 2019-09-02 DIAGNOSIS — R2681 Unsteadiness on feet: Secondary | ICD-10-CM | POA: Diagnosis not present

## 2019-09-02 DIAGNOSIS — I214 Non-ST elevation (NSTEMI) myocardial infarction: Secondary | ICD-10-CM | POA: Diagnosis not present

## 2019-09-02 DIAGNOSIS — I110 Hypertensive heart disease with heart failure: Secondary | ICD-10-CM | POA: Diagnosis not present

## 2019-09-02 DIAGNOSIS — G4733 Obstructive sleep apnea (adult) (pediatric): Secondary | ICD-10-CM | POA: Diagnosis not present

## 2019-09-02 DIAGNOSIS — F17219 Nicotine dependence, cigarettes, with unspecified nicotine-induced disorders: Secondary | ICD-10-CM | POA: Diagnosis not present

## 2019-09-02 DIAGNOSIS — M1009 Idiopathic gout, multiple sites: Secondary | ICD-10-CM | POA: Diagnosis not present

## 2019-09-02 DIAGNOSIS — I5042 Chronic combined systolic (congestive) and diastolic (congestive) heart failure: Secondary | ICD-10-CM | POA: Diagnosis not present

## 2019-09-02 DIAGNOSIS — F419 Anxiety disorder, unspecified: Secondary | ICD-10-CM | POA: Diagnosis not present

## 2019-09-02 DIAGNOSIS — G8929 Other chronic pain: Secondary | ICD-10-CM | POA: Diagnosis not present

## 2019-09-02 DIAGNOSIS — J9612 Chronic respiratory failure with hypercapnia: Secondary | ICD-10-CM | POA: Diagnosis not present

## 2019-09-02 DIAGNOSIS — M069 Rheumatoid arthritis, unspecified: Secondary | ICD-10-CM | POA: Diagnosis not present

## 2019-09-02 DIAGNOSIS — E782 Mixed hyperlipidemia: Secondary | ICD-10-CM | POA: Diagnosis not present

## 2019-09-02 DIAGNOSIS — R7989 Other specified abnormal findings of blood chemistry: Secondary | ICD-10-CM | POA: Diagnosis not present

## 2019-09-02 DIAGNOSIS — Z79899 Other long term (current) drug therapy: Secondary | ICD-10-CM | POA: Diagnosis not present

## 2019-09-02 DIAGNOSIS — G2581 Restless legs syndrome: Secondary | ICD-10-CM | POA: Diagnosis not present

## 2019-09-02 DIAGNOSIS — M1990 Unspecified osteoarthritis, unspecified site: Secondary | ICD-10-CM | POA: Diagnosis not present

## 2019-09-02 DIAGNOSIS — K219 Gastro-esophageal reflux disease without esophagitis: Secondary | ICD-10-CM | POA: Diagnosis not present

## 2019-09-02 DIAGNOSIS — F17210 Nicotine dependence, cigarettes, uncomplicated: Secondary | ICD-10-CM | POA: Diagnosis not present

## 2019-09-02 DIAGNOSIS — E7800 Pure hypercholesterolemia, unspecified: Secondary | ICD-10-CM | POA: Diagnosis not present

## 2019-09-02 DIAGNOSIS — J449 Chronic obstructive pulmonary disease, unspecified: Secondary | ICD-10-CM | POA: Diagnosis not present

## 2019-09-02 DIAGNOSIS — Z79891 Long term (current) use of opiate analgesic: Secondary | ICD-10-CM | POA: Diagnosis not present

## 2019-09-02 DIAGNOSIS — I119 Hypertensive heart disease without heart failure: Secondary | ICD-10-CM | POA: Diagnosis not present

## 2019-09-02 DIAGNOSIS — J9611 Chronic respiratory failure with hypoxia: Secondary | ICD-10-CM | POA: Diagnosis not present

## 2019-09-03 DIAGNOSIS — I5042 Chronic combined systolic (congestive) and diastolic (congestive) heart failure: Secondary | ICD-10-CM | POA: Diagnosis not present

## 2019-09-03 DIAGNOSIS — F329 Major depressive disorder, single episode, unspecified: Secondary | ICD-10-CM | POA: Diagnosis not present

## 2019-09-03 DIAGNOSIS — J449 Chronic obstructive pulmonary disease, unspecified: Secondary | ICD-10-CM | POA: Diagnosis not present

## 2019-09-03 DIAGNOSIS — G308 Other Alzheimer's disease: Secondary | ICD-10-CM | POA: Diagnosis not present

## 2019-09-03 DIAGNOSIS — I110 Hypertensive heart disease with heart failure: Secondary | ICD-10-CM | POA: Diagnosis not present

## 2019-09-04 DIAGNOSIS — G308 Other Alzheimer's disease: Secondary | ICD-10-CM | POA: Diagnosis not present

## 2019-09-04 DIAGNOSIS — I110 Hypertensive heart disease with heart failure: Secondary | ICD-10-CM | POA: Diagnosis not present

## 2019-09-04 DIAGNOSIS — F329 Major depressive disorder, single episode, unspecified: Secondary | ICD-10-CM | POA: Diagnosis not present

## 2019-09-04 DIAGNOSIS — I5042 Chronic combined systolic (congestive) and diastolic (congestive) heart failure: Secondary | ICD-10-CM | POA: Diagnosis not present

## 2019-09-04 DIAGNOSIS — J449 Chronic obstructive pulmonary disease, unspecified: Secondary | ICD-10-CM | POA: Diagnosis not present

## 2019-09-07 DIAGNOSIS — I5042 Chronic combined systolic (congestive) and diastolic (congestive) heart failure: Secondary | ICD-10-CM | POA: Diagnosis not present

## 2019-09-07 DIAGNOSIS — G308 Other Alzheimer's disease: Secondary | ICD-10-CM | POA: Diagnosis not present

## 2019-09-07 DIAGNOSIS — I110 Hypertensive heart disease with heart failure: Secondary | ICD-10-CM | POA: Diagnosis not present

## 2019-09-07 DIAGNOSIS — F329 Major depressive disorder, single episode, unspecified: Secondary | ICD-10-CM | POA: Diagnosis not present

## 2019-09-07 DIAGNOSIS — J449 Chronic obstructive pulmonary disease, unspecified: Secondary | ICD-10-CM | POA: Diagnosis not present

## 2019-09-11 DIAGNOSIS — J449 Chronic obstructive pulmonary disease, unspecified: Secondary | ICD-10-CM | POA: Diagnosis not present

## 2019-09-11 DIAGNOSIS — G308 Other Alzheimer's disease: Secondary | ICD-10-CM | POA: Diagnosis not present

## 2019-09-11 DIAGNOSIS — I110 Hypertensive heart disease with heart failure: Secondary | ICD-10-CM | POA: Diagnosis not present

## 2019-09-11 DIAGNOSIS — I5042 Chronic combined systolic (congestive) and diastolic (congestive) heart failure: Secondary | ICD-10-CM | POA: Diagnosis not present

## 2019-09-11 DIAGNOSIS — F329 Major depressive disorder, single episode, unspecified: Secondary | ICD-10-CM | POA: Diagnosis not present

## 2019-09-14 DIAGNOSIS — G308 Other Alzheimer's disease: Secondary | ICD-10-CM | POA: Diagnosis not present

## 2019-09-14 DIAGNOSIS — I110 Hypertensive heart disease with heart failure: Secondary | ICD-10-CM | POA: Diagnosis not present

## 2019-09-14 DIAGNOSIS — F329 Major depressive disorder, single episode, unspecified: Secondary | ICD-10-CM | POA: Diagnosis not present

## 2019-09-14 DIAGNOSIS — I5042 Chronic combined systolic (congestive) and diastolic (congestive) heart failure: Secondary | ICD-10-CM | POA: Diagnosis not present

## 2019-09-14 DIAGNOSIS — J449 Chronic obstructive pulmonary disease, unspecified: Secondary | ICD-10-CM | POA: Diagnosis not present

## 2019-09-15 DIAGNOSIS — G4733 Obstructive sleep apnea (adult) (pediatric): Secondary | ICD-10-CM | POA: Diagnosis not present

## 2019-09-15 DIAGNOSIS — E785 Hyperlipidemia, unspecified: Secondary | ICD-10-CM

## 2019-09-15 DIAGNOSIS — F0390 Unspecified dementia without behavioral disturbance: Secondary | ICD-10-CM | POA: Diagnosis not present

## 2019-09-15 DIAGNOSIS — I959 Hypotension, unspecified: Secondary | ICD-10-CM | POA: Diagnosis not present

## 2019-09-15 DIAGNOSIS — J811 Chronic pulmonary edema: Secondary | ICD-10-CM | POA: Diagnosis not present

## 2019-09-15 DIAGNOSIS — R55 Syncope and collapse: Secondary | ICD-10-CM | POA: Diagnosis not present

## 2019-09-15 DIAGNOSIS — E875 Hyperkalemia: Secondary | ICD-10-CM | POA: Diagnosis not present

## 2019-09-15 DIAGNOSIS — I5041 Acute combined systolic (congestive) and diastolic (congestive) heart failure: Secondary | ICD-10-CM

## 2019-09-15 DIAGNOSIS — R531 Weakness: Secondary | ICD-10-CM | POA: Diagnosis not present

## 2019-09-15 DIAGNOSIS — F329 Major depressive disorder, single episode, unspecified: Secondary | ICD-10-CM | POA: Diagnosis not present

## 2019-09-15 DIAGNOSIS — I110 Hypertensive heart disease with heart failure: Secondary | ICD-10-CM | POA: Diagnosis not present

## 2019-09-15 DIAGNOSIS — I5042 Chronic combined systolic (congestive) and diastolic (congestive) heart failure: Secondary | ICD-10-CM | POA: Diagnosis not present

## 2019-09-15 DIAGNOSIS — I10 Essential (primary) hypertension: Secondary | ICD-10-CM | POA: Diagnosis not present

## 2019-09-15 DIAGNOSIS — S022XXA Fracture of nasal bones, initial encounter for closed fracture: Secondary | ICD-10-CM | POA: Diagnosis not present

## 2019-09-15 DIAGNOSIS — I509 Heart failure, unspecified: Secondary | ICD-10-CM | POA: Diagnosis not present

## 2019-09-15 DIAGNOSIS — Z03818 Encounter for observation for suspected exposure to other biological agents ruled out: Secondary | ICD-10-CM | POA: Diagnosis not present

## 2019-09-15 DIAGNOSIS — R42 Dizziness and giddiness: Secondary | ICD-10-CM | POA: Diagnosis not present

## 2019-09-15 DIAGNOSIS — J449 Chronic obstructive pulmonary disease, unspecified: Secondary | ICD-10-CM | POA: Diagnosis not present

## 2019-09-15 DIAGNOSIS — G308 Other Alzheimer's disease: Secondary | ICD-10-CM | POA: Diagnosis not present

## 2019-09-15 DIAGNOSIS — N179 Acute kidney failure, unspecified: Secondary | ICD-10-CM | POA: Diagnosis not present

## 2019-09-15 DIAGNOSIS — S3993XA Unspecified injury of pelvis, initial encounter: Secondary | ICD-10-CM | POA: Diagnosis not present

## 2019-09-15 DIAGNOSIS — S199XXA Unspecified injury of neck, initial encounter: Secondary | ICD-10-CM | POA: Diagnosis not present

## 2019-09-15 DIAGNOSIS — S0990XA Unspecified injury of head, initial encounter: Secondary | ICD-10-CM | POA: Diagnosis not present

## 2019-09-16 ENCOUNTER — Ambulatory Visit: Payer: Medicare Other | Admitting: Cardiology

## 2019-09-16 DIAGNOSIS — F0390 Unspecified dementia without behavioral disturbance: Secondary | ICD-10-CM | POA: Diagnosis not present

## 2019-09-16 DIAGNOSIS — E875 Hyperkalemia: Secondary | ICD-10-CM | POA: Diagnosis not present

## 2019-09-16 DIAGNOSIS — T50995A Adverse effect of other drugs, medicaments and biological substances, initial encounter: Secondary | ICD-10-CM | POA: Diagnosis present

## 2019-09-16 DIAGNOSIS — Z9114 Patient's other noncompliance with medication regimen: Secondary | ICD-10-CM | POA: Diagnosis not present

## 2019-09-16 DIAGNOSIS — G4733 Obstructive sleep apnea (adult) (pediatric): Secondary | ICD-10-CM

## 2019-09-16 DIAGNOSIS — G8929 Other chronic pain: Secondary | ICD-10-CM | POA: Diagnosis present

## 2019-09-16 DIAGNOSIS — S0990XA Unspecified injury of head, initial encounter: Secondary | ICD-10-CM | POA: Diagnosis not present

## 2019-09-16 DIAGNOSIS — E662 Morbid (severe) obesity with alveolar hypoventilation: Secondary | ICD-10-CM | POA: Diagnosis present

## 2019-09-16 DIAGNOSIS — M109 Gout, unspecified: Secondary | ICD-10-CM | POA: Diagnosis present

## 2019-09-16 DIAGNOSIS — I509 Heart failure, unspecified: Secondary | ICD-10-CM | POA: Diagnosis not present

## 2019-09-16 DIAGNOSIS — R55 Syncope and collapse: Secondary | ICD-10-CM | POA: Diagnosis not present

## 2019-09-16 DIAGNOSIS — I440 Atrioventricular block, first degree: Secondary | ICD-10-CM | POA: Diagnosis present

## 2019-09-16 DIAGNOSIS — Z9981 Dependence on supplemental oxygen: Secondary | ICD-10-CM | POA: Diagnosis not present

## 2019-09-16 DIAGNOSIS — S3993XA Unspecified injury of pelvis, initial encounter: Secondary | ICD-10-CM | POA: Diagnosis not present

## 2019-09-16 DIAGNOSIS — G2581 Restless legs syndrome: Secondary | ICD-10-CM | POA: Diagnosis present

## 2019-09-16 DIAGNOSIS — S199XXA Unspecified injury of neck, initial encounter: Secondary | ICD-10-CM | POA: Diagnosis not present

## 2019-09-16 DIAGNOSIS — I5041 Acute combined systolic (congestive) and diastolic (congestive) heart failure: Secondary | ICD-10-CM | POA: Diagnosis not present

## 2019-09-16 DIAGNOSIS — N179 Acute kidney failure, unspecified: Secondary | ICD-10-CM | POA: Diagnosis not present

## 2019-09-16 DIAGNOSIS — E669 Obesity, unspecified: Secondary | ICD-10-CM | POA: Diagnosis present

## 2019-09-16 DIAGNOSIS — I10 Essential (primary) hypertension: Secondary | ICD-10-CM | POA: Diagnosis not present

## 2019-09-16 DIAGNOSIS — I5042 Chronic combined systolic (congestive) and diastolic (congestive) heart failure: Secondary | ICD-10-CM | POA: Diagnosis not present

## 2019-09-16 DIAGNOSIS — I420 Dilated cardiomyopathy: Secondary | ICD-10-CM | POA: Diagnosis not present

## 2019-09-16 DIAGNOSIS — D509 Iron deficiency anemia, unspecified: Secondary | ICD-10-CM | POA: Diagnosis present

## 2019-09-16 DIAGNOSIS — E785 Hyperlipidemia, unspecified: Secondary | ICD-10-CM | POA: Diagnosis not present

## 2019-09-16 DIAGNOSIS — I5040 Unspecified combined systolic (congestive) and diastolic (congestive) heart failure: Secondary | ICD-10-CM | POA: Diagnosis not present

## 2019-09-16 DIAGNOSIS — S022XXA Fracture of nasal bones, initial encounter for closed fracture: Secondary | ICD-10-CM | POA: Diagnosis not present

## 2019-09-16 DIAGNOSIS — I110 Hypertensive heart disease with heart failure: Secondary | ICD-10-CM | POA: Diagnosis present

## 2019-09-16 DIAGNOSIS — Z03818 Encounter for observation for suspected exposure to other biological agents ruled out: Secondary | ICD-10-CM | POA: Diagnosis not present

## 2019-09-16 DIAGNOSIS — K219 Gastro-esophageal reflux disease without esophagitis: Secondary | ICD-10-CM | POA: Diagnosis present

## 2019-09-16 DIAGNOSIS — I959 Hypotension, unspecified: Secondary | ICD-10-CM | POA: Diagnosis not present

## 2019-09-16 DIAGNOSIS — F418 Other specified anxiety disorders: Secondary | ICD-10-CM | POA: Diagnosis present

## 2019-09-16 DIAGNOSIS — J449 Chronic obstructive pulmonary disease, unspecified: Secondary | ICD-10-CM | POA: Diagnosis present

## 2019-09-16 DIAGNOSIS — J811 Chronic pulmonary edema: Secondary | ICD-10-CM | POA: Diagnosis not present

## 2019-09-16 DIAGNOSIS — E860 Dehydration: Secondary | ICD-10-CM | POA: Diagnosis present

## 2019-09-17 DIAGNOSIS — E875 Hyperkalemia: Secondary | ICD-10-CM | POA: Diagnosis not present

## 2019-09-17 DIAGNOSIS — I5040 Unspecified combined systolic (congestive) and diastolic (congestive) heart failure: Secondary | ICD-10-CM | POA: Diagnosis not present

## 2019-09-17 DIAGNOSIS — N179 Acute kidney failure, unspecified: Secondary | ICD-10-CM | POA: Diagnosis not present

## 2019-09-17 DIAGNOSIS — R55 Syncope and collapse: Secondary | ICD-10-CM | POA: Diagnosis not present

## 2019-09-17 DIAGNOSIS — I959 Hypotension, unspecified: Secondary | ICD-10-CM | POA: Diagnosis not present

## 2019-09-17 DIAGNOSIS — I420 Dilated cardiomyopathy: Secondary | ICD-10-CM | POA: Diagnosis not present

## 2019-09-18 DIAGNOSIS — R55 Syncope and collapse: Secondary | ICD-10-CM | POA: Diagnosis not present

## 2019-09-18 DIAGNOSIS — I959 Hypotension, unspecified: Secondary | ICD-10-CM | POA: Diagnosis not present

## 2019-09-18 DIAGNOSIS — N179 Acute kidney failure, unspecified: Secondary | ICD-10-CM | POA: Diagnosis not present

## 2019-09-18 DIAGNOSIS — E875 Hyperkalemia: Secondary | ICD-10-CM | POA: Diagnosis not present

## 2019-09-20 DIAGNOSIS — G308 Other Alzheimer's disease: Secondary | ICD-10-CM | POA: Diagnosis not present

## 2019-09-20 DIAGNOSIS — I110 Hypertensive heart disease with heart failure: Secondary | ICD-10-CM | POA: Diagnosis not present

## 2019-09-20 DIAGNOSIS — J449 Chronic obstructive pulmonary disease, unspecified: Secondary | ICD-10-CM | POA: Diagnosis not present

## 2019-09-20 DIAGNOSIS — F329 Major depressive disorder, single episode, unspecified: Secondary | ICD-10-CM | POA: Diagnosis not present

## 2019-09-20 DIAGNOSIS — I5042 Chronic combined systolic (congestive) and diastolic (congestive) heart failure: Secondary | ICD-10-CM | POA: Diagnosis not present

## 2019-09-22 DIAGNOSIS — R42 Dizziness and giddiness: Secondary | ICD-10-CM | POA: Diagnosis not present

## 2019-09-23 DIAGNOSIS — I110 Hypertensive heart disease with heart failure: Secondary | ICD-10-CM | POA: Diagnosis not present

## 2019-09-23 DIAGNOSIS — J449 Chronic obstructive pulmonary disease, unspecified: Secondary | ICD-10-CM | POA: Diagnosis not present

## 2019-09-23 DIAGNOSIS — F329 Major depressive disorder, single episode, unspecified: Secondary | ICD-10-CM | POA: Diagnosis not present

## 2019-09-23 DIAGNOSIS — E875 Hyperkalemia: Secondary | ICD-10-CM | POA: Diagnosis not present

## 2019-09-23 DIAGNOSIS — G308 Other Alzheimer's disease: Secondary | ICD-10-CM | POA: Diagnosis not present

## 2019-09-23 DIAGNOSIS — I5042 Chronic combined systolic (congestive) and diastolic (congestive) heart failure: Secondary | ICD-10-CM | POA: Diagnosis not present

## 2019-09-30 DIAGNOSIS — J449 Chronic obstructive pulmonary disease, unspecified: Secondary | ICD-10-CM | POA: Diagnosis not present

## 2019-09-30 DIAGNOSIS — I5042 Chronic combined systolic (congestive) and diastolic (congestive) heart failure: Secondary | ICD-10-CM | POA: Diagnosis not present

## 2019-09-30 DIAGNOSIS — J189 Pneumonia, unspecified organism: Secondary | ICD-10-CM | POA: Diagnosis not present

## 2019-09-30 DIAGNOSIS — I110 Hypertensive heart disease with heart failure: Secondary | ICD-10-CM | POA: Diagnosis not present

## 2019-09-30 DIAGNOSIS — I10 Essential (primary) hypertension: Secondary | ICD-10-CM

## 2019-09-30 DIAGNOSIS — I5040 Unspecified combined systolic (congestive) and diastolic (congestive) heart failure: Secondary | ICD-10-CM | POA: Diagnosis not present

## 2019-09-30 DIAGNOSIS — F329 Major depressive disorder, single episode, unspecified: Secondary | ICD-10-CM | POA: Diagnosis not present

## 2019-09-30 DIAGNOSIS — I420 Dilated cardiomyopathy: Secondary | ICD-10-CM | POA: Diagnosis not present

## 2019-09-30 DIAGNOSIS — R61 Generalized hyperhidrosis: Secondary | ICD-10-CM | POA: Diagnosis not present

## 2019-09-30 DIAGNOSIS — R55 Syncope and collapse: Secondary | ICD-10-CM | POA: Diagnosis not present

## 2019-09-30 DIAGNOSIS — I959 Hypotension, unspecified: Secondary | ICD-10-CM | POA: Diagnosis not present

## 2019-09-30 DIAGNOSIS — R531 Weakness: Secondary | ICD-10-CM | POA: Diagnosis not present

## 2019-09-30 DIAGNOSIS — Z03818 Encounter for observation for suspected exposure to other biological agents ruled out: Secondary | ICD-10-CM | POA: Diagnosis not present

## 2019-10-01 DIAGNOSIS — M109 Gout, unspecified: Secondary | ICD-10-CM | POA: Diagnosis not present

## 2019-10-01 DIAGNOSIS — G2581 Restless legs syndrome: Secondary | ICD-10-CM | POA: Diagnosis present

## 2019-10-01 DIAGNOSIS — Z8701 Personal history of pneumonia (recurrent): Secondary | ICD-10-CM | POA: Diagnosis not present

## 2019-10-01 DIAGNOSIS — K219 Gastro-esophageal reflux disease without esophagitis: Secondary | ICD-10-CM | POA: Diagnosis not present

## 2019-10-01 DIAGNOSIS — Z720 Tobacco use: Secondary | ICD-10-CM | POA: Diagnosis not present

## 2019-10-01 DIAGNOSIS — Z79899 Other long term (current) drug therapy: Secondary | ICD-10-CM | POA: Diagnosis not present

## 2019-10-01 DIAGNOSIS — I5040 Unspecified combined systolic (congestive) and diastolic (congestive) heart failure: Secondary | ICD-10-CM | POA: Diagnosis not present

## 2019-10-01 DIAGNOSIS — J449 Chronic obstructive pulmonary disease, unspecified: Secondary | ICD-10-CM | POA: Diagnosis not present

## 2019-10-01 DIAGNOSIS — G4733 Obstructive sleep apnea (adult) (pediatric): Secondary | ICD-10-CM | POA: Diagnosis not present

## 2019-10-01 DIAGNOSIS — Z03818 Encounter for observation for suspected exposure to other biological agents ruled out: Secondary | ICD-10-CM | POA: Diagnosis not present

## 2019-10-01 DIAGNOSIS — F329 Major depressive disorder, single episode, unspecified: Secondary | ICD-10-CM | POA: Diagnosis present

## 2019-10-01 DIAGNOSIS — J9611 Chronic respiratory failure with hypoxia: Secondary | ICD-10-CM | POA: Diagnosis not present

## 2019-10-01 DIAGNOSIS — E876 Hypokalemia: Secondary | ICD-10-CM | POA: Diagnosis not present

## 2019-10-01 DIAGNOSIS — E7800 Pure hypercholesterolemia, unspecified: Secondary | ICD-10-CM | POA: Diagnosis present

## 2019-10-01 DIAGNOSIS — R55 Syncope and collapse: Secondary | ICD-10-CM | POA: Diagnosis present

## 2019-10-01 DIAGNOSIS — Z880 Allergy status to penicillin: Secondary | ICD-10-CM | POA: Diagnosis not present

## 2019-10-01 DIAGNOSIS — Z888 Allergy status to other drugs, medicaments and biological substances status: Secondary | ICD-10-CM | POA: Diagnosis not present

## 2019-10-01 DIAGNOSIS — S022XXD Fracture of nasal bones, subsequent encounter for fracture with routine healing: Secondary | ICD-10-CM | POA: Diagnosis not present

## 2019-10-01 DIAGNOSIS — E662 Morbid (severe) obesity with alveolar hypoventilation: Secondary | ICD-10-CM | POA: Diagnosis not present

## 2019-10-01 DIAGNOSIS — Z885 Allergy status to narcotic agent status: Secondary | ICD-10-CM | POA: Diagnosis not present

## 2019-10-01 DIAGNOSIS — R7989 Other specified abnormal findings of blood chemistry: Secondary | ICD-10-CM | POA: Diagnosis not present

## 2019-10-01 DIAGNOSIS — Z7982 Long term (current) use of aspirin: Secondary | ICD-10-CM | POA: Diagnosis not present

## 2019-10-01 DIAGNOSIS — F419 Anxiety disorder, unspecified: Secondary | ICD-10-CM | POA: Diagnosis not present

## 2019-10-01 DIAGNOSIS — F0280 Dementia in other diseases classified elsewhere without behavioral disturbance: Secondary | ICD-10-CM | POA: Diagnosis not present

## 2019-10-01 DIAGNOSIS — F17210 Nicotine dependence, cigarettes, uncomplicated: Secondary | ICD-10-CM | POA: Diagnosis present

## 2019-10-01 DIAGNOSIS — J9612 Chronic respiratory failure with hypercapnia: Secondary | ICD-10-CM | POA: Diagnosis not present

## 2019-10-01 DIAGNOSIS — I959 Hypotension, unspecified: Secondary | ICD-10-CM | POA: Diagnosis not present

## 2019-10-01 DIAGNOSIS — G308 Other Alzheimer's disease: Secondary | ICD-10-CM | POA: Diagnosis not present

## 2019-10-01 DIAGNOSIS — I10 Essential (primary) hypertension: Secondary | ICD-10-CM | POA: Diagnosis not present

## 2019-10-01 DIAGNOSIS — I440 Atrioventricular block, first degree: Secondary | ICD-10-CM | POA: Diagnosis not present

## 2019-10-01 DIAGNOSIS — F0390 Unspecified dementia without behavioral disturbance: Secondary | ICD-10-CM | POA: Diagnosis present

## 2019-10-01 DIAGNOSIS — E782 Mixed hyperlipidemia: Secondary | ICD-10-CM | POA: Diagnosis not present

## 2019-10-01 DIAGNOSIS — M069 Rheumatoid arthritis, unspecified: Secondary | ICD-10-CM | POA: Diagnosis not present

## 2019-10-01 DIAGNOSIS — G8929 Other chronic pain: Secondary | ICD-10-CM | POA: Diagnosis not present

## 2019-10-01 DIAGNOSIS — M1990 Unspecified osteoarthritis, unspecified site: Secondary | ICD-10-CM | POA: Diagnosis present

## 2019-10-01 DIAGNOSIS — I420 Dilated cardiomyopathy: Secondary | ICD-10-CM | POA: Diagnosis not present

## 2019-10-01 DIAGNOSIS — I110 Hypertensive heart disease with heart failure: Secondary | ICD-10-CM | POA: Diagnosis present

## 2019-10-01 DIAGNOSIS — J439 Emphysema, unspecified: Secondary | ICD-10-CM | POA: Diagnosis present

## 2019-10-01 DIAGNOSIS — I252 Old myocardial infarction: Secondary | ICD-10-CM | POA: Diagnosis not present

## 2019-10-01 DIAGNOSIS — I5042 Chronic combined systolic (congestive) and diastolic (congestive) heart failure: Secondary | ICD-10-CM | POA: Diagnosis not present

## 2019-10-02 ENCOUNTER — Telehealth: Payer: Self-pay | Admitting: Cardiology

## 2019-10-02 DIAGNOSIS — I420 Dilated cardiomyopathy: Secondary | ICD-10-CM | POA: Diagnosis not present

## 2019-10-02 DIAGNOSIS — I959 Hypotension, unspecified: Secondary | ICD-10-CM | POA: Diagnosis not present

## 2019-10-02 DIAGNOSIS — J449 Chronic obstructive pulmonary disease, unspecified: Secondary | ICD-10-CM | POA: Diagnosis not present

## 2019-10-02 DIAGNOSIS — I5040 Unspecified combined systolic (congestive) and diastolic (congestive) heart failure: Secondary | ICD-10-CM | POA: Diagnosis not present

## 2019-10-02 NOTE — Telephone Encounter (Signed)
Shawn Osborn at [facility] called stating he is supposed to come for a 30 day monitor today after his realease from hospital. Please advise

## 2019-10-05 NOTE — Telephone Encounter (Signed)
This patient was seen by Dr. Geraldo Pitter at [facility] last week while he was rounding. Please speak with Dr. Geraldo Pitter and see how he wants to proceed with this patient and make patient aware of the plan. Thanks!

## 2019-10-08 ENCOUNTER — Encounter: Payer: Self-pay | Admitting: *Deleted

## 2019-10-08 DIAGNOSIS — J9602 Acute respiratory failure with hypercapnia: Secondary | ICD-10-CM | POA: Insufficient documentation

## 2019-10-08 DIAGNOSIS — I5042 Chronic combined systolic (congestive) and diastolic (congestive) heart failure: Secondary | ICD-10-CM | POA: Insufficient documentation

## 2019-10-08 DIAGNOSIS — J9611 Chronic respiratory failure with hypoxia: Secondary | ICD-10-CM | POA: Insufficient documentation

## 2019-10-08 DIAGNOSIS — G4733 Obstructive sleep apnea (adult) (pediatric): Secondary | ICD-10-CM

## 2019-10-08 DIAGNOSIS — A419 Sepsis, unspecified organism: Secondary | ICD-10-CM | POA: Insufficient documentation

## 2019-10-08 DIAGNOSIS — E782 Mixed hyperlipidemia: Secondary | ICD-10-CM | POA: Insufficient documentation

## 2019-10-08 DIAGNOSIS — I214 Non-ST elevation (NSTEMI) myocardial infarction: Secondary | ICD-10-CM | POA: Insufficient documentation

## 2019-10-08 DIAGNOSIS — R410 Disorientation, unspecified: Secondary | ICD-10-CM | POA: Insufficient documentation

## 2019-10-08 DIAGNOSIS — I509 Heart failure, unspecified: Secondary | ICD-10-CM | POA: Insufficient documentation

## 2019-10-08 DIAGNOSIS — I110 Hypertensive heart disease with heart failure: Secondary | ICD-10-CM | POA: Diagnosis not present

## 2019-10-08 DIAGNOSIS — R7989 Other specified abnormal findings of blood chemistry: Secondary | ICD-10-CM | POA: Insufficient documentation

## 2019-10-08 DIAGNOSIS — E785 Hyperlipidemia, unspecified: Secondary | ICD-10-CM | POA: Insufficient documentation

## 2019-10-08 DIAGNOSIS — I10 Essential (primary) hypertension: Secondary | ICD-10-CM | POA: Insufficient documentation

## 2019-10-08 DIAGNOSIS — T40601A Poisoning by unspecified narcotics, accidental (unintentional), initial encounter: Secondary | ICD-10-CM

## 2019-10-08 DIAGNOSIS — J9692 Respiratory failure, unspecified with hypercapnia: Secondary | ICD-10-CM

## 2019-10-08 DIAGNOSIS — Z6839 Body mass index (BMI) 39.0-39.9, adult: Secondary | ICD-10-CM | POA: Insufficient documentation

## 2019-10-08 DIAGNOSIS — N390 Urinary tract infection, site not specified: Secondary | ICD-10-CM

## 2019-10-08 DIAGNOSIS — J9601 Acute respiratory failure with hypoxia: Secondary | ICD-10-CM

## 2019-10-08 DIAGNOSIS — G92 Toxic encephalopathy: Secondary | ICD-10-CM

## 2019-10-08 DIAGNOSIS — G309 Alzheimer's disease, unspecified: Secondary | ICD-10-CM | POA: Insufficient documentation

## 2019-10-08 DIAGNOSIS — F0390 Unspecified dementia without behavioral disturbance: Secondary | ICD-10-CM

## 2019-10-08 DIAGNOSIS — J189 Pneumonia, unspecified organism: Secondary | ICD-10-CM | POA: Insufficient documentation

## 2019-10-08 DIAGNOSIS — G928 Other toxic encephalopathy: Secondary | ICD-10-CM

## 2019-10-08 DIAGNOSIS — I951 Orthostatic hypotension: Secondary | ICD-10-CM | POA: Diagnosis not present

## 2019-10-08 DIAGNOSIS — J449 Chronic obstructive pulmonary disease, unspecified: Secondary | ICD-10-CM

## 2019-10-08 DIAGNOSIS — I5041 Acute combined systolic (congestive) and diastolic (congestive) heart failure: Secondary | ICD-10-CM

## 2019-10-08 DIAGNOSIS — G300 Alzheimer's disease with early onset: Secondary | ICD-10-CM | POA: Insufficient documentation

## 2019-10-08 DIAGNOSIS — F0283 Dementia in other diseases classified elsewhere, unspecified severity, with mood disturbance: Secondary | ICD-10-CM | POA: Insufficient documentation

## 2019-10-08 DIAGNOSIS — Z9989 Dependence on other enabling machines and devices: Secondary | ICD-10-CM

## 2019-10-08 DIAGNOSIS — R42 Dizziness and giddiness: Secondary | ICD-10-CM | POA: Diagnosis not present

## 2019-10-08 DIAGNOSIS — J9691 Respiratory failure, unspecified with hypoxia: Secondary | ICD-10-CM

## 2019-10-08 DIAGNOSIS — I119 Hypertensive heart disease without heart failure: Secondary | ICD-10-CM

## 2019-10-08 DIAGNOSIS — R778 Other specified abnormalities of plasma proteins: Secondary | ICD-10-CM

## 2019-10-08 DIAGNOSIS — F0280 Dementia in other diseases classified elsewhere without behavioral disturbance: Secondary | ICD-10-CM | POA: Insufficient documentation

## 2019-10-08 HISTORY — DX: Morbid (severe) obesity due to excess calories: E66.01

## 2019-10-08 HISTORY — DX: Chronic obstructive pulmonary disease, unspecified: J44.9

## 2019-10-08 HISTORY — DX: Acute respiratory failure with hypoxia: J96.01

## 2019-10-08 HISTORY — DX: Disorientation, unspecified: R41.0

## 2019-10-08 HISTORY — DX: Other specified abnormal findings of blood chemistry: R79.89

## 2019-10-08 HISTORY — DX: Hyperlipidemia, unspecified: E78.5

## 2019-10-08 HISTORY — DX: Respiratory failure, unspecified with hypoxia: J96.91

## 2019-10-08 HISTORY — DX: Acute combined systolic (congestive) and diastolic (congestive) heart failure: I50.41

## 2019-10-08 HISTORY — DX: Non-ST elevation (NSTEMI) myocardial infarction: I21.4

## 2019-10-08 HISTORY — DX: Hypertensive heart disease without heart failure: I11.9

## 2019-10-08 HISTORY — DX: Acute respiratory failure with hypercapnia: J96.02

## 2019-10-08 HISTORY — DX: Respiratory failure, unspecified with hypercapnia: J96.92

## 2019-10-08 HISTORY — DX: Unspecified dementia, unspecified severity, without behavioral disturbance, psychotic disturbance, mood disturbance, and anxiety: F03.90

## 2019-10-08 HISTORY — DX: Obstructive sleep apnea (adult) (pediatric): G47.33

## 2019-10-08 HISTORY — DX: Poisoning by unspecified narcotics, accidental (unintentional), initial encounter: T40.601A

## 2019-10-08 HISTORY — DX: Essential (primary) hypertension: I10

## 2019-10-08 HISTORY — DX: Sepsis, unspecified organism: A41.9

## 2019-10-08 HISTORY — DX: Heart failure, unspecified: I50.9

## 2019-10-08 HISTORY — DX: Urinary tract infection, site not specified: N39.0

## 2019-10-08 HISTORY — DX: Dependence on other enabling machines and devices: Z99.89

## 2019-10-08 HISTORY — DX: Pneumonia, unspecified organism: J18.9

## 2019-10-08 HISTORY — DX: Other specified abnormalities of plasma proteins: R77.8

## 2019-10-08 HISTORY — DX: Other toxic encephalopathy: G92.8

## 2019-10-08 NOTE — Telephone Encounter (Signed)
Phone call to patient to notify him that he will need an appointment with Dr Harriet Masson.  Patient is scheduled to see Dr Harriet Masson on 10-09-2019 at 155 pm.  Patient agreed to plan and verbalized understanding.  No further questions.

## 2019-10-08 NOTE — Telephone Encounter (Signed)
Patient was seen by Dr. Bettina Gavia and he probably has a follow-up appointment.  He also saw Dr. Harriet Masson who agreed with it.  Please have the patient get a 30-day monitor and follow-up with either of those doctors.  Dr. Harriet Masson would be better as she would have more appointments available.

## 2019-10-09 ENCOUNTER — Ambulatory Visit (INDEPENDENT_AMBULATORY_CARE_PROVIDER_SITE_OTHER): Payer: Medicare Other | Admitting: Cardiology

## 2019-10-09 ENCOUNTER — Other Ambulatory Visit: Payer: Self-pay

## 2019-10-09 ENCOUNTER — Encounter: Payer: Self-pay | Admitting: Cardiology

## 2019-10-09 ENCOUNTER — Ambulatory Visit (INDEPENDENT_AMBULATORY_CARE_PROVIDER_SITE_OTHER): Payer: Medicare Other

## 2019-10-09 VITALS — BP 104/62 | HR 81 | Ht 71.0 in | Wt 284.0 lb

## 2019-10-09 DIAGNOSIS — R55 Syncope and collapse: Secondary | ICD-10-CM

## 2019-10-09 DIAGNOSIS — R931 Abnormal findings on diagnostic imaging of heart and coronary circulation: Secondary | ICD-10-CM

## 2019-10-09 DIAGNOSIS — I10 Essential (primary) hypertension: Secondary | ICD-10-CM

## 2019-10-09 DIAGNOSIS — I519 Heart disease, unspecified: Secondary | ICD-10-CM | POA: Diagnosis not present

## 2019-10-09 DIAGNOSIS — E669 Obesity, unspecified: Secondary | ICD-10-CM | POA: Insufficient documentation

## 2019-10-09 DIAGNOSIS — E782 Mixed hyperlipidemia: Secondary | ICD-10-CM | POA: Diagnosis not present

## 2019-10-09 DIAGNOSIS — Z01812 Encounter for preprocedural laboratory examination: Secondary | ICD-10-CM | POA: Diagnosis not present

## 2019-10-09 DIAGNOSIS — Z6841 Body Mass Index (BMI) 40.0 and over, adult: Secondary | ICD-10-CM | POA: Insufficient documentation

## 2019-10-09 MED ORDER — FUROSEMIDE 20 MG PO TABS: ORAL_TABLET | ORAL | 1 refills | Status: DC

## 2019-10-09 NOTE — H&P (View-Only) (Signed)
Cardiology Office Note:    Date:  10/09/2019   ID:  Shawn Osborn, DOB [birth year], MRN [id number]  PCP:  Rochel Brome, MD  Cardiologist:  Berniece Salines, DO  Electrophysiologist:  None   Referring MD: Rochel Brome, MD   Follow-up visit  History of Present Illness:    Shawn Osborn is a 68 y.o. male with a hx of hypertension, hyperlipidemia, tobacco use and recent syncope episodes.  The patient was recently admitted at the [facility] for syncope.  He was evaluated by Dr. Geraldo Pitter during his hospitalization.  At that time it was recommended the patient wear a monitor.  The patient described his syncope episodes as abrupt with no warning signs.  He tells me that sometimes he is walking when he passes out where he is just sitting in a chair.  At times his blood pressure is low when this occurs but has been times when his blood pressure is also normal.  He admits that on a couple occasions he has had palpitations associated with this.  He denies any chest pain or shortness of breath is associated with his episodes.  He is here today for hospital follow-up visit.  Past Medical History:  Diagnosis Date  . Acute combined systolic and diastolic CHF, NYHA class 3 ([place]) 10/08/2019  . Acute delirium 10/08/2019  . Acute exacerbation of CHF (congestive heart failure) ([place]) 10/08/2019  . Acute gout of multiple sites 07/01/2016  . Acute respiratory failure with hypoxia and hypercapnia ([place]) 10/08/2019  . Allergy   . Anxiety   . Arthritis    Gout, osteoarthritis in Back and Knees  . Cataract   . CHF (congestive heart failure) ([place])   . Chronic bronchitis   . Community acquired pneumonia 10/08/2019  . COPD (chronic obstructive pulmonary disease) ([place]) 10/08/2019  . Dementia ([place]) 10/08/2019  . Elevated troponin 10/08/2019  . Emphysema of lung ([place])   . Essential hypertension 07/03/2016  . GERD (gastroesophageal reflux disease)   . History of kidney stones   . HLD  (hyperlipidemia) 10/08/2019  . Hypercapnic respiratory failure ([place]) 10/08/2019  . Hyperlipidemia   . Hypertension   . Hypertensive heart disease 10/08/2019  . Morbid obesity due to excess calories ([place]) 10/08/2019  . Narcotic overdose ([place]) 10/08/2019  . 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 NSTEMI (non-ST elevated myocardial infarction) ([place]) 10/08/2019  . Obstructive sleep apnea   . Occlusion and stenosis of carotid artery without mention of cerebral infarction 12/13/2011  . OSA on CPAP 10/08/2019  . Pain in joint of left shoulder 01/12/2019  . Paranoia (psychosis) ([place]) 07/01/2016  . Poorly-controlled hypertension 10/08/2019  . Prediabetes   . Psychotic disorder with delusions ([place]) 07/01/2016  . Respiratory failure with hypoxia and hypercapnia ([place]) 10/08/2019  . Restless leg syndrome 07/03/2016  . Rheumatoid arthritis(714.0)   . RLS (restless legs syndrome)   . Sepsis ([place]) 10/08/2019  . Sleep apnea   . Spinal stenosis   . Syncope 04/03/2019  . Tear of left rotator cuff 01/12/2019  . Toxic metabolic encephalopathy [id number]  . UTI (urinary tract infection) 10/08/2019  . Vascular dementia with behavioral disturbance ([place]) 07/06/2016    Past Surgical History:  Procedure Laterality Date  . APPENDECTOMY    . CARPAL TUNNEL RELEASE    . EYE SURGERY    . [place], 2003, 2012   Laminectomy X 3    Current Medications: Current Meds  Medication Sig  .  albuterol (PROVENTIL) (2.5 MG/3ML) 0.083% nebulizer solution Take 2.5 mg by nebulization every 6 (six) hours as needed.  Marland Kitchen allopurinol (ZYLOPRIM) 300 MG tablet Take 300 mg by mouth daily.  Marland Kitchen aspirin 81 MG tablet Take 81 mg by mouth daily.  Marland Kitchen atenolol (TENORMIN) 50 MG tablet Take 50 mg by mouth daily.  . Colchicine 0.6 MG CAPS Take 0.6 mg by mouth daily.  Marland Kitchen donepezil (ARICEPT) 5 MG tablet Take 5 mg by mouth at bedtime.  Marland Kitchen FOLATE-B12-INTRINSIC FACTOR PO Take by mouth daily.   . formoterol  (PERFOROMIST) 20 MCG/2ML nebulizer solution Take 20 mcg by nebulization 2 (two) times daily.  . isosorbide mononitrate (IMDUR) 30 MG 24 hr tablet Take 30 mg by mouth daily.  . metoprolol tartrate (LOPRESSOR) 25 MG tablet Take 12.5 mg by mouth daily as needed. Take 12.5 in am if BP is greater than 123456 (systolic)  . omeprazole (PRILOSEC) 20 MG capsule Take 20 mg by mouth 2 (two) times daily.  Marland Kitchen oxyCODONE (OXY IR/ROXICODONE) 5 MG immediate release tablet Take 5 mg by mouth 4 (four) times daily.  . pregabalin (LYRICA) 150 MG capsule Take 150 mg by mouth 2 (two) times daily.  . Revefenacin 175 MCG/3ML SOLN Inhale 1 vial into the lungs.  . rosuvastatin (CRESTOR) 5 MG tablet Take 5 mg by mouth daily.  . sertraline (ZOLOFT) 100 MG tablet Take 100 mg by mouth daily.  Marland Kitchen spironolactone (ALDACTONE) 25 MG tablet Take 25 mg by mouth daily.  . valsartan (DIOVAN) 40 MG tablet Take 40 mg by mouth as needed. Take if BP is greater than 140     Allergies:   Avelox [moxifloxacin hcl in nacl], Codeine phosphate, Cozaar, Cymbalta [duloxetine hcl], Statins, Welchol [colesevelam hcl], and Penicillins   Social History   Socioeconomic History  . Marital status: Widowed    Spouse name: Not on file  . Number of children: 4  . Years of education: Not on file  . Highest education level: Not on file  Occupational History  . Not on file  Social Needs  . Financial resource strain: Not on file  . Food insecurity    Worry: Not on file    Inability: Not on file  . Transportation needs    Medical: Not on file    Non-medical: Not on file  Tobacco Use  . Smoking status: Current Every Day Smoker    Packs/day: 2.00    Years: 53.00    Pack years: 106.00    Last attempt to quit: 11/06/2018    Years since quitting: 0.9  . Smokeless tobacco: Current User    Types: Chew  Substance and Sexual Activity  . Alcohol use: No  . Drug use: No  . Sexual activity: Not on file  Lifestyle  . Physical activity    Days per week:  Not on file    Minutes per session: Not on file  . Stress: Not on file  Relationships  . Social Herbalist on phone: Not on file    Gets together: Not on file    Attends religious service: Not on file    Active member of club or organization: Not on file    Attends meetings of clubs or organizations: Not on file    Relationship status: Not on file  Other Topics Concern  . Not on file  Social History Narrative  . Not on file     Family History: The patient's family history includes Heart disease in  his mother; Hypertension in his mother.  ROS:   Review of Systems  Constitution: Negative for decreased appetite, fever and weight gain.  HENT: Negative for congestion, ear discharge, hoarse voice and sore throat.   Eyes: Negative for discharge, redness, vision loss in right eye and visual halos.  Cardiovascular: Negative for chest pain, dyspnea on exertion, leg swelling, orthopnea and palpitations.  Respiratory: Negative for cough, hemoptysis, shortness of breath and snoring.   Endocrine: Negative for heat intolerance and polyphagia.  Hematologic/Lymphatic: Negative for bleeding problem. Does not bruise/bleed easily.  Skin: Negative for flushing, nail changes, rash and suspicious lesions.  Musculoskeletal: Negative for arthritis, joint pain, muscle cramps, myalgias, neck pain and stiffness.  Gastrointestinal: Negative for abdominal pain, bowel incontinence, diarrhea and excessive appetite.  Genitourinary: Negative for decreased libido, genital sores and incomplete emptying.  Neurological: Negative for brief paralysis, focal weakness, headaches and loss of balance.  Psychiatric/Behavioral: Negative for altered mental status, depression and suicidal ideas.  Allergic/Immunologic: Negative for HIV exposure and persistent infections.    EKGs/Labs/Other Studies Reviewed:    The following studies were reviewed today:   EKG:  The ekg ordered today demonstrates sinus rhythm,  heart rate 76 bpm, left axis deviation with no specific ST changes.  No prior EKG for comparison.  Echocardiogram on August 24, 2019 reports mild concentric left ventricular hypertrophy.  Moderate hypokinesis with apical akinesis.  His overall left ventricular systolic function was 40 to 45%.  With a pseudonormal LV filling pattern consistent with elevated left atrial pressure.  Left atrium was mildly dilated.   Recent Labs: No results found for requested labs within last 8760 hours.  Recent Lipid Panel No results found for: CHOL, TRIG, HDL, CHOLHDL, VLDL, LDLCALC, LDLDIRECT  Physical Exam:    VS:  BP 104/62 (BP Location: Left Arm, Patient Position: Sitting, Cuff Size: Large)   Pulse 81   Ht 5\' 11"  (1.803 m)   Wt 284 lb (128.8 kg)   SpO2 97%   BMI 39.61 kg/m     Wt Readings from Last 3 Encounters:  10/09/19 284 lb (128.8 kg)  01/22/19 (!) 301 lb (136.5 kg)  01/16/19 (!) 310 lb (140.6 kg)     GEN: Well nourished, well developed in no acute distress HEENT: Normal NECK: No JVD; No carotid bruits LYMPHATICS: No lymphadenopathy CARDIAC: S1S2 noted,RRR, no murmurs, rubs, gallops RESPIRATORY:  Clear to auscultation without rales, wheezing or rhonchi  ABDOMEN: Soft, non-tender, non-distended, +bowel sounds, no guarding. EXTREMITIES: No edema, No cyanosis, no clubbing MUSCULOSKELETAL:  No edema; No deformity  SKIN: Warm and dry NEUROLOGIC:  Alert and oriented x 3, non-focal PSYCHIATRIC:  Normal affect, good insight  ASSESSMENT:    1. Depressed left ventricular systolic function   2. Syncope, unspecified syncope type   3. Pre-procedure lab exam   4. Abnormal echocardiogram   5. Essential hypertension   6. Obesity (BMI 30-39.9)    PLAN:    1.  The patient echo is remarkable with his depressed LV ejection fraction at 40 to 45% and wall motion abnormality with apical akinesis.  With this, like to pursue a left heart catheterization in this patient to rule out any coronary artery  disease especially in his left anterior descending artery.  The patient denies any IV contrast allergies.  Have educated patient about this testing.  The patient understands that risks include but are not limited to stroke (1 in 1000), death (1 in 41), kidney failure [usually temporary] (1 in 500), bleeding (1 in  200), allergic reaction [possibly serious] (1 in 200), and agrees to proceed.  2.  Recurrent syncope-we will place a 30-day monitor on the patient understand there is any arrhythmias.  In addition we will rule out coronary artery disease as cause of this as stated above.  3.  Hypertension-the patient is taking his medication cautiously he has been asked to take his blood pressure before he takes his medicine only take his antihypertensive medication in the morning if his systolic BP is greater than 140 mmHg.  In addition he will take his Lasix as needed if his weight is greater than 5 pounds in 3 days.  4.  Obesity-the patient understands the need to lose weight with diet and exercise. We have discussed specific strategies for this.  The patient is in agreement with the above plan. The patient left the office in stable condition.  The patient will follow up in 1 month.   Medication Adjustments/Labs and Tests Ordered: Current medicines are reviewed at length with the patient today.  Concerns regarding medicines are outlined above.   Orders Placed This Encounter  Procedures  . CBC  . Basic Metabolic Panel (BMET)  . Cardiac event monitor  . EKG 12-Lead   Meds ordered this encounter  Medications  . furosemide (LASIX) 20 MG tablet    Sig: Take as needed    Dispense:  90 tablet    Refill:  1    Patient Instructions  Medication Instructions:  Your physician has recommended you make the following change in your medication:   START: Lasix (furosemide) 20mg  Take as needed  *If you need a refill on your cardiac medications before your next appointment, please call your pharmacy*   Lab Work: Your physician recommends that you return for lab work in: [place] CBC,BMP   If you have labs (blood work) drawn today and your tests are completely normal, you will receive your results only by: Marland Kitchen MyChart Message (if you have MyChart) OR . A paper copy in the mail If you have any lab test that is abnormal or we need to change your treatment, we will call you to review the results.  Testing/Procedures:    [facility] [place] [address] Dept: [phone] Loc: [phone]  AMYR WASZAK  10/09/2019  You are scheduled for a Cardiac Catheterization on Tuesday, December 1 with Dr. Daneen Schick.  1. Please arrive at the [facility] (Main Entrance A) at [facility]: [address] [place], [place] [address] at 7:00 AM (This time is two hours before your procedure to ensure your preparation). Free valet parking service is available.   Special note: Every effort is made to have your procedure done on time. Please understand that emergencies sometimes delay scheduled procedures.  2. Diet: Do not eat solid foods after midnight.  The patient may have clear liquids until 5am upon the day of the procedure.  3. Labs: You will need to have a COVID screen 3-4 days prior to cath. Your appointment is Sat Nov 28,2020at 10:50AM at [address],[place]  4. Medication instructions in preparation for your procedure:   Contrast Allergy: No  On the morning of your procedure, take your Aspirin and any morning medicines NOT listed above.  You may use sips of water.  5. Plan for one night stay--bring personal belongings. 6. Bring a current list of your medications and current insurance cards. 7. You MUST  have a responsible person to drive you home. 8. Someone MUST be with you the first 24 hours after you arrive home or your discharge will be delayed. 9. Please wear clothes  that are easy to get on and off and wear slip-on shoes.  Thank you for allowing [place] to care for you!   -- [facility] Your physician recommends that you continue on your current medications as directed. Please refer to the Current Medication list given to you today.   Your physician has recommended that you wear a event monitor. Event monitors are medical devices that record the heart's electrical activity. Doctors most often use these monitors to diagnose arrhythmias. Arrhythmias are problems with the speed or rhythm of the heartbeat. The monitor is a small, portable device. You can wear one while you do your normal daily activities. This is usually used to diagnose what is causing palpitations/syncope (passing out).  Follow-Up: At [facility], you and your health needs are our priority.  As part of our continuing mission to provide you with exceptional heart care, we have created designated Provider Care Teams.  These Care Teams include your primary Cardiologist (physician) and Advanced Practice Providers (APPs -  Physician Assistants and Nurse Practitioners) who all work together to provide you with the care you need, when you need it.  Your next appointment:   1 month(s)  The format for your next appointment:   In Person  Provider:   Berniece Salines, DO  Other Instructions      Adopting a Healthy Lifestyle.  Know what a healthy weight is for you (roughly BMI <25) and aim to maintain this   Aim for 7+ servings of fruits and vegetables daily   65-80+ fluid ounces of water or unsweet tea for healthy kidneys   Limit to max 1 drink of alcohol per day; avoid smoking/tobacco   Limit animal fats in diet for cholesterol and heart health - choose grass fed whenever available   Avoid highly processed foods, and foods high in saturated/trans fats   Aim for low stress - take time to unwind and care for your mental health   Aim for 150 min of moderate  intensity exercise weekly for heart health, and weights twice weekly for bone health   Aim for 7-9 hours of sleep daily   When it comes to diets, agreement about the perfect plan isnt easy to find, even among the experts. Experts at the [employer] developed an idea known as the Healthy Eating Plate. Just imagine a plate divided into logical, healthy portions.   The emphasis is on diet quality:   Load up on vegetables and fruits - one-half of your plate: Aim for color and variety, and remember that potatoes dont count.   Go for whole grains - one-quarter of your plate: Whole wheat, barley, wheat berries, quinoa, oats, brown rice, and foods made with them. If you want pasta, go with whole wheat pasta.   Protein power - one-quarter of your plate: Fish, chicken, beans, and nuts are all healthy, versatile protein sources. Limit red meat.   The diet, however, does go beyond the plate, offering a few other suggestions.   Use healthy plant oils, such as olive, canola, soy, corn, sunflower and peanut. Check the labels, and avoid partially hydrogenated oil, which have unhealthy trans fats.   If youre thirsty, drink water. Coffee and tea are good in moderation, but skip sugary drinks and limit milk and  dairy products to one or two daily servings.   The type of carbohydrate in the diet is more important than the amount. Some sources of carbohydrates, such as vegetables, fruits, whole grains, and beans-are healthier than others.   Finally, stay active  Signed, Berniece Salines, DO  10/09/2019 4:06 PM    [facility]

## 2019-10-09 NOTE — Progress Notes (Signed)
Cardiology Office Note:    Date:  10/09/2019   ID:  Shawn Osborn, DOB [birth year], MRN [id number]  PCP:  Rochel Brome, MD  Cardiologist:  Berniece Salines, DO  Electrophysiologist:  None   Referring MD: Rochel Brome, MD   Follow-up visit  History of Present Illness:    Shawn Osborn is a 68 y.o. male with a hx of hypertension, hyperlipidemia, tobacco use and recent syncope episodes.  The patient was recently admitted at the [facility] for syncope.  He was evaluated by Dr. Geraldo Pitter during his hospitalization.  At that time it was recommended the patient wear a monitor.  The patient described his syncope episodes as abrupt with no warning signs.  He tells me that sometimes he is walking when he passes out where he is just sitting in a chair.  At times his blood pressure is low when this occurs but has been times when his blood pressure is also normal.  He admits that on a couple occasions he has had palpitations associated with this.  He denies any chest pain or shortness of breath is associated with his episodes.  He is here today for hospital follow-up visit.  Past Medical History:  Diagnosis Date  . Acute combined systolic and diastolic CHF, NYHA class 3 ([place]) 10/08/2019  . Acute delirium 10/08/2019  . Acute exacerbation of CHF (congestive heart failure) ([place]) 10/08/2019  . Acute gout of multiple sites 07/01/2016  . Acute respiratory failure with hypoxia and hypercapnia ([place]) 10/08/2019  . Allergy   . Anxiety   . Arthritis    Gout, osteoarthritis in Back and Knees  . Cataract   . CHF (congestive heart failure) ([place])   . Chronic bronchitis   . Community acquired pneumonia 10/08/2019  . COPD (chronic obstructive pulmonary disease) ([place]) 10/08/2019  . Dementia ([place]) 10/08/2019  . Elevated troponin 10/08/2019  . Emphysema of lung ([place])   . Essential hypertension 07/03/2016  . GERD (gastroesophageal reflux disease)   . History of kidney stones   . HLD  (hyperlipidemia) 10/08/2019  . Hypercapnic respiratory failure ([place]) 10/08/2019  . Hyperlipidemia   . Hypertension   . Hypertensive heart disease 10/08/2019  . Morbid obesity due to excess calories ([place]) 10/08/2019  . Narcotic overdose ([place]) 10/08/2019  . 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 NSTEMI (non-ST elevated myocardial infarction) ([place]) 10/08/2019  . Obstructive sleep apnea   . Occlusion and stenosis of carotid artery without mention of cerebral infarction 12/13/2011  . OSA on CPAP 10/08/2019  . Pain in joint of left shoulder 01/12/2019  . Paranoia (psychosis) ([place]) 07/01/2016  . Poorly-controlled hypertension 10/08/2019  . Prediabetes   . Psychotic disorder with delusions ([place]) 07/01/2016  . Respiratory failure with hypoxia and hypercapnia ([place]) 10/08/2019  . Restless leg syndrome 07/03/2016  . Rheumatoid arthritis(714.0)   . RLS (restless legs syndrome)   . Sepsis ([place]) 10/08/2019  . Sleep apnea   . Spinal stenosis   . Syncope 04/03/2019  . Tear of left rotator cuff 01/12/2019  . Toxic metabolic encephalopathy [id number]  . UTI (urinary tract infection) 10/08/2019  . Vascular dementia with behavioral disturbance ([place]) 07/06/2016    Past Surgical History:  Procedure Laterality Date  . APPENDECTOMY    . CARPAL TUNNEL RELEASE    . EYE SURGERY    . [place], 2003, 2012   Laminectomy X 3    Current Medications: Current Meds  Medication Sig  .  albuterol (PROVENTIL) (2.5 MG/3ML) 0.083% nebulizer solution Take 2.5 mg by nebulization every 6 (six) hours as needed.  Marland Kitchen allopurinol (ZYLOPRIM) 300 MG tablet Take 300 mg by mouth daily.  Marland Kitchen aspirin 81 MG tablet Take 81 mg by mouth daily.  Marland Kitchen atenolol (TENORMIN) 50 MG tablet Take 50 mg by mouth daily.  . Colchicine 0.6 MG CAPS Take 0.6 mg by mouth daily.  Marland Kitchen donepezil (ARICEPT) 5 MG tablet Take 5 mg by mouth at bedtime.  Marland Kitchen FOLATE-B12-INTRINSIC FACTOR PO Take by mouth daily.   . formoterol  (PERFOROMIST) 20 MCG/2ML nebulizer solution Take 20 mcg by nebulization 2 (two) times daily.  . isosorbide mononitrate (IMDUR) 30 MG 24 hr tablet Take 30 mg by mouth daily.  . metoprolol tartrate (LOPRESSOR) 25 MG tablet Take 12.5 mg by mouth daily as needed. Take 12.5 in am if BP is greater than 123456 (systolic)  . omeprazole (PRILOSEC) 20 MG capsule Take 20 mg by mouth 2 (two) times daily.  Marland Kitchen oxyCODONE (OXY IR/ROXICODONE) 5 MG immediate release tablet Take 5 mg by mouth 4 (four) times daily.  . pregabalin (LYRICA) 150 MG capsule Take 150 mg by mouth 2 (two) times daily.  . Revefenacin 175 MCG/3ML SOLN Inhale 1 vial into the lungs.  . rosuvastatin (CRESTOR) 5 MG tablet Take 5 mg by mouth daily.  . sertraline (ZOLOFT) 100 MG tablet Take 100 mg by mouth daily.  Marland Kitchen spironolactone (ALDACTONE) 25 MG tablet Take 25 mg by mouth daily.  . valsartan (DIOVAN) 40 MG tablet Take 40 mg by mouth as needed. Take if BP is greater than 140     Allergies:   Avelox [moxifloxacin hcl in nacl], Codeine phosphate, Cozaar, Cymbalta [duloxetine hcl], Statins, Welchol [colesevelam hcl], and Penicillins   Social History   Socioeconomic History  . Marital status: Widowed    Spouse name: Not on file  . Number of children: 4  . Years of education: Not on file  . Highest education level: Not on file  Occupational History  . Not on file  Social Needs  . Financial resource strain: Not on file  . Food insecurity    Worry: Not on file    Inability: Not on file  . Transportation needs    Medical: Not on file    Non-medical: Not on file  Tobacco Use  . Smoking status: Current Every Day Smoker    Packs/day: 2.00    Years: 53.00    Pack years: 106.00    Last attempt to quit: 11/06/2018    Years since quitting: 0.9  . Smokeless tobacco: Current User    Types: Chew  Substance and Sexual Activity  . Alcohol use: No  . Drug use: No  . Sexual activity: Not on file  Lifestyle  . Physical activity    Days per week:  Not on file    Minutes per session: Not on file  . Stress: Not on file  Relationships  . Social Herbalist on phone: Not on file    Gets together: Not on file    Attends religious service: Not on file    Active member of club or organization: Not on file    Attends meetings of clubs or organizations: Not on file    Relationship status: Not on file  Other Topics Concern  . Not on file  Social History Narrative  . Not on file     Family History: The patient's family history includes Heart disease in  his mother; Hypertension in his mother.  ROS:   Review of Systems  Constitution: Negative for decreased appetite, fever and weight gain.  HENT: Negative for congestion, ear discharge, hoarse voice and sore throat.   Eyes: Negative for discharge, redness, vision loss in right eye and visual halos.  Cardiovascular: Negative for chest pain, dyspnea on exertion, leg swelling, orthopnea and palpitations.  Respiratory: Negative for cough, hemoptysis, shortness of breath and snoring.   Endocrine: Negative for heat intolerance and polyphagia.  Hematologic/Lymphatic: Negative for bleeding problem. Does not bruise/bleed easily.  Skin: Negative for flushing, nail changes, rash and suspicious lesions.  Musculoskeletal: Negative for arthritis, joint pain, muscle cramps, myalgias, neck pain and stiffness.  Gastrointestinal: Negative for abdominal pain, bowel incontinence, diarrhea and excessive appetite.  Genitourinary: Negative for decreased libido, genital sores and incomplete emptying.  Neurological: Negative for brief paralysis, focal weakness, headaches and loss of balance.  Psychiatric/Behavioral: Negative for altered mental status, depression and suicidal ideas.  Allergic/Immunologic: Negative for HIV exposure and persistent infections.    EKGs/Labs/Other Studies Reviewed:    The following studies were reviewed today:   EKG:  The ekg ordered today demonstrates sinus rhythm,  heart rate 76 bpm, left axis deviation with no specific ST changes.  No prior EKG for comparison.  Echocardiogram on August 24, 2019 reports mild concentric left ventricular hypertrophy.  Moderate hypokinesis with apical akinesis.  His overall left ventricular systolic function was 40 to 45%.  With a pseudonormal LV filling pattern consistent with elevated left atrial pressure.  Left atrium was mildly dilated.   Recent Labs: No results found for requested labs within last 8760 hours.  Recent Lipid Panel No results found for: CHOL, TRIG, HDL, CHOLHDL, VLDL, LDLCALC, LDLDIRECT  Physical Exam:    VS:  BP 104/62 (BP Location: Left Arm, Patient Position: Sitting, Cuff Size: Large)   Pulse 81   Ht 5\' 11"  (1.803 m)   Wt 284 lb (128.8 kg)   SpO2 97%   BMI 39.61 kg/m     Wt Readings from Last 3 Encounters:  10/09/19 284 lb (128.8 kg)  01/22/19 (!) 301 lb (136.5 kg)  01/16/19 (!) 310 lb (140.6 kg)     GEN: Well nourished, well developed in no acute distress HEENT: Normal NECK: No JVD; No carotid bruits LYMPHATICS: No lymphadenopathy CARDIAC: S1S2 noted,RRR, no murmurs, rubs, gallops RESPIRATORY:  Clear to auscultation without rales, wheezing or rhonchi  ABDOMEN: Soft, non-tender, non-distended, +bowel sounds, no guarding. EXTREMITIES: No edema, No cyanosis, no clubbing MUSCULOSKELETAL:  No edema; No deformity  SKIN: Warm and dry NEUROLOGIC:  Alert and oriented x 3, non-focal PSYCHIATRIC:  Normal affect, good insight  ASSESSMENT:    1. Depressed left ventricular systolic function   2. Syncope, unspecified syncope type   3. Pre-procedure lab exam   4. Abnormal echocardiogram   5. Essential hypertension   6. Obesity (BMI 30-39.9)    PLAN:    1.  The patient echo is remarkable with his depressed LV ejection fraction at 40 to 45% and wall motion abnormality with apical akinesis.  With this, like to pursue a left heart catheterization in this patient to rule out any coronary artery  disease especially in his left anterior descending artery.  The patient denies any IV contrast allergies.  Have educated patient about this testing.  The patient understands that risks include but are not limited to stroke (1 in 1000), death (1 in 63), kidney failure [usually temporary] (1 in 500), bleeding (1 in  200), allergic reaction [possibly serious] (1 in 200), and agrees to proceed.  2.  Recurrent syncope-we will place a 30-day monitor on the patient understand there is any arrhythmias.  In addition we will rule out coronary artery disease as cause of this as stated above.  3.  Hypertension-the patient is taking his medication cautiously he has been asked to take his blood pressure before he takes his medicine only take his antihypertensive medication in the morning if his systolic BP is greater than 140 mmHg.  In addition he will take his Lasix as needed if his weight is greater than 5 pounds in 3 days.  4.  Obesity-the patient understands the need to lose weight with diet and exercise. We have discussed specific strategies for this.  The patient is in agreement with the above plan. The patient left the office in stable condition.  The patient will follow up in 1 month.   Medication Adjustments/Labs and Tests Ordered: Current medicines are reviewed at length with the patient today.  Concerns regarding medicines are outlined above.   Orders Placed This Encounter  Procedures  . CBC  . Basic Metabolic Panel (BMET)  . Cardiac event monitor  . EKG 12-Lead   Meds ordered this encounter  Medications  . furosemide (LASIX) 20 MG tablet    Sig: Take as needed    Dispense:  90 tablet    Refill:  1    Patient Instructions  Medication Instructions:  Your physician has recommended you make the following change in your medication:   START: Lasix (furosemide) 20mg  Take as needed  *If you need a refill on your cardiac medications before your next appointment, please call your pharmacy*   Lab Work: Your physician recommends that you return for lab work in: [place] CBC,BMP   If you have labs (blood work) drawn today and your tests are completely normal, you will receive your results only by: Marland Kitchen MyChart Message (if you have MyChart) OR . A paper copy in the mail If you have any lab test that is abnormal or we need to change your treatment, we will call you to review the results.  Testing/Procedures:    [place] [address] Dept: [phone] Loc: [phone]  TERIN BARSE  10/09/2019  You are scheduled for a Cardiac Catheterization on Tuesday, December 1 with Dr. Daneen Schick.  1. Please arrive at the [facility] (Main Entrance A) at [facility]: [address] [place], [place] [address] at 7:00 AM (This time is two hours before your procedure to ensure your preparation). Free valet parking service is available.   Special note: Every effort is made to have your procedure done on time. Please understand that emergencies sometimes delay scheduled procedures.  2. Diet: Do not eat solid foods after midnight.  The patient may have clear liquids until 5am upon the day of the procedure.  3. Labs: You will need to have a COVID screen 3-4 days prior to cath. Your appointment is Sat Nov 28,2020at 10:50AM at [address],[place]  4. Medication instructions in preparation for your procedure:   Contrast Allergy: No  On the morning of your procedure, take your Aspirin and any morning medicines NOT listed above.  You may use sips of water.  5. Plan for one night stay--bring personal belongings. 6. Bring a current list of your medications and current insurance cards. 7. You MUST  have a responsible person to drive you home. 8. Someone MUST be with you the first 24 hours after you arrive home or your discharge will be delayed. 9. Please wear clothes  that are easy to get on and off and wear slip-on shoes.  Thank you for allowing [place] to care for you!   -- [facility] Your physician recommends that you continue on your current medications as directed. Please refer to the Current Medication list given to you today.   Your physician has recommended that you wear a event monitor. Event monitors are medical devices that record the heart's electrical activity. Doctors most often use these monitors to diagnose arrhythmias. Arrhythmias are problems with the speed or rhythm of the heartbeat. The monitor is a small, portable device. You can wear one while you do your normal daily activities. This is usually used to diagnose what is causing palpitations/syncope (passing out).  Follow-Up: At [facility], you and your health needs are our priority.  As part of our continuing mission to provide you with exceptional heart care, we have created designated Provider Care Teams.  These Care Teams include your primary Cardiologist (physician) and Advanced Practice Providers (APPs -  Physician Assistants and Nurse Practitioners) who all work together to provide you with the care you need, when you need it.  Your next appointment:   1 month(s)  The format for your next appointment:   In Person  Provider:   Berniece Salines, DO  Other Instructions      Adopting a Healthy Lifestyle.  Know what a healthy weight is for you (roughly BMI <25) and aim to maintain this   Aim for 7+ servings of fruits and vegetables daily   65-80+ fluid ounces of water or unsweet tea for healthy kidneys   Limit to max 1 drink of alcohol per day; avoid smoking/tobacco   Limit animal fats in diet for cholesterol and heart health - choose grass fed whenever available   Avoid highly processed foods, and foods high in saturated/trans fats   Aim for low stress - take time to unwind and care for your mental health   Aim for 150 min of moderate  intensity exercise weekly for heart health, and weights twice weekly for bone health   Aim for 7-9 hours of sleep daily   When it comes to diets, agreement about the perfect plan isnt easy to find, even among the experts. Experts at the [employer] developed an idea known as the Healthy Eating Plate. Just imagine a plate divided into logical, healthy portions.   The emphasis is on diet quality:   Load up on vegetables and fruits - one-half of your plate: Aim for color and variety, and remember that potatoes dont count.   Go for whole grains - one-quarter of your plate: Whole wheat, barley, wheat berries, quinoa, oats, brown rice, and foods made with them. If you want pasta, go with whole wheat pasta.   Protein power - one-quarter of your plate: Fish, chicken, beans, and nuts are all healthy, versatile protein sources. Limit red meat.   The diet, however, does go beyond the plate, offering a few other suggestions.   Use healthy plant oils, such as olive, canola, soy, corn, sunflower and peanut. Check the labels, and avoid partially hydrogenated oil, which have unhealthy trans fats.   If youre thirsty, drink water. Coffee and tea are good in moderation, but skip sugary drinks and limit milk and  dairy products to one or two daily servings.   The type of carbohydrate in the diet is more important than the amount. Some sources of carbohydrates, such as vegetables, fruits, whole grains, and beans-are healthier than others.   Finally, stay active  Signed, Berniece Salines, DO  10/09/2019 4:06 PM    [facility]

## 2019-10-09 NOTE — Patient Instructions (Addendum)
Medication Instructions:  Your physician has recommended you make the following change in your medication:   START: Lasix (furosemide) 20mg  Take as needed  *If you need a refill on your cardiac medications before your next appointment, please call your pharmacy*  Lab Work: Your physician recommends that you return for lab work in: [place] CBC,BMP   If you have labs (blood work) drawn today and your tests are completely normal, you will receive your results only by: Marland Kitchen MyChart Message (if you have MyChart) OR . A paper copy in the mail If you have any lab test that is abnormal or we need to change your treatment, we will call you to review the results.  Testing/Procedures:    [place] [address] Dept: [phone] Loc: [phone]  ALGIS YAMBAO  10/09/2019  You are scheduled for a Cardiac Catheterization on Tuesday, December 1 with Dr. Daneen Schick.  1. Please arrive at the [facility] (Main Entrance A) at [facility]: [address] [place], [place] [address] at 7:00 AM (This time is two hours before your procedure to ensure your preparation). Free valet parking service is available.   Special note: Every effort is made to have your procedure done on time. Please understand that emergencies sometimes delay scheduled procedures.  2. Diet: Do not eat solid foods after midnight.  The patient may have clear liquids until 5am upon the day of the procedure.  3. Labs: You will need to have a COVID screen 3-4 days prior to cath. Your appointment is Sat Nov 28,2020at 10:50AM at [address],[place]  4. Medication instructions in preparation for your procedure:   Contrast Allergy: No  On the morning of your procedure, take your Aspirin and any morning medicines NOT listed above.  You may use sips of water.  5. Plan for one night stay--bring personal  belongings. 6. Bring a current list of your medications and current insurance cards. 7. You MUST have a responsible person to drive you home. 8. Someone MUST be with you the first 24 hours after you arrive home or your discharge will be delayed. 9. Please wear clothes that are easy to get on and off and wear slip-on shoes.  Thank you for allowing [place] to care for you!   -- [place] [facility] Your physician recommends that you continue on your current medications as directed. Please refer to the Current Medication list given to you today.   Your physician has recommended that you wear a event monitor. Event monitors are medical devices that record the heart's electrical activity. Doctors most often use these monitors to diagnose arrhythmias. Arrhythmias are problems with the speed or rhythm of the heartbeat. The monitor is a small, portable device. You can wear one while you do your normal daily activities. This is usually used to diagnose what is causing palpitations/syncope (passing out).  Follow-Up: At [facility], you and your health needs are our priority.  As part of our continuing mission to provide you with exceptional heart care, we have created designated Provider Care Teams.  These Care Teams include your primary Cardiologist (physician) and Advanced Practice Providers (APPs -  Physician Assistants and Nurse Practitioners) who all work together to provide you with the care you need, when you need it.  Your next appointment:   1 month(s)  The format for your next appointment:   In Person  Provider:   Berniece Salines, DO  Other Instructions

## 2019-10-10 LAB — CBC
Hematocrit: 35.5 % — ABNORMAL LOW (ref 37.5–51.0)
Hemoglobin: 10.7 g/dL — ABNORMAL LOW (ref 13.0–17.7)
MCH: 24.2 pg — ABNORMAL LOW (ref 26.6–33.0)
MCHC: 30.1 g/dL — ABNORMAL LOW (ref 31.5–35.7)
MCV: 80 fL (ref 79–97)
Platelets: 415 10*3/uL (ref 150–450)
RBC: 4.43 x10E6/uL (ref 4.14–5.80)
RDW: 18.3 % — ABNORMAL HIGH (ref 11.6–15.4)
WBC: 11.1 10*3/uL — ABNORMAL HIGH (ref 3.4–10.8)

## 2019-10-10 LAB — BASIC METABOLIC PANEL
BUN/Creatinine Ratio: 17 (ref 10–24)
BUN: 13 mg/dL (ref 8–27)
CO2: 26 mmol/L (ref 20–29)
Calcium: 9 mg/dL (ref 8.6–10.2)
Chloride: 104 mmol/L (ref 96–106)
Creatinine, Ser: 0.76 mg/dL (ref 0.76–1.27)
GFR calc Af Amer: 108 mL/min/{1.73_m2} (ref >59)
GFR calc non Af Amer: 93 mL/min/{1.73_m2} (ref >59)
Glucose: 99 mg/dL (ref 65–99)
Potassium: 4.7 mmol/L (ref 3.5–5.2)
Sodium: 144 mmol/L (ref 134–144)

## 2019-10-12 ENCOUNTER — Encounter: Payer: Self-pay | Admitting: *Deleted

## 2019-10-17 ENCOUNTER — Other Ambulatory Visit (HOSPITAL_COMMUNITY)
Admission: RE | Admit: 2019-10-17 | Discharge: 2019-10-17 | Disposition: A | Discharge status: Home / Self Care | Payer: Medicare Other | Source: Ambulatory Visit | Attending: Interventional Cardiology | Admitting: Interventional Cardiology

## 2019-10-17 DIAGNOSIS — Z01812 Encounter for preprocedural laboratory examination: Secondary | ICD-10-CM | POA: Insufficient documentation

## 2019-10-17 DIAGNOSIS — Z20828 Contact with and (suspected) exposure to other viral communicable diseases: Secondary | ICD-10-CM | POA: Insufficient documentation

## 2019-10-17 LAB — SARS CORONAVIRUS 2 (TAT 6-24 HRS): SARS Coronavirus 2: NEGATIVE

## 2019-10-19 ENCOUNTER — Telehealth: Payer: Self-pay | Admitting: *Deleted

## 2019-10-19 NOTE — Telephone Encounter (Signed)
Pt contacted pre-catheterization scheduled at [facility] for: Tuesday October 20, 2019 9 AM Verified arrival time and place: [facility]) at: 7 AM   No solid food after midnight prior to cath, clear liquids until 5 AM day of procedure. Contrast allergy: no  Hold: Lasix-AM of procedure.  Except hold medications AM meds can be  taken pre-cath with sip of water including: ASA 81 mg   Confirmed patient has responsible adult to drive home post procedure and observe 24 hours after arriving home: yes  Currently, due to Covid-19 pandemic, only one support person will be allowed with patient. Must be the same support person for that patient's entire stay, will be screened and required to wear a mask. They will be asked to wait in the waiting room for the duration of the patient's stay.  Patients are required to wear a mask when they enter the hospital.      COVID-19 Pre-Screening Questions:  . In the past 7 to 10 days have you had a cough,  shortness of breath, headache, congestion, fever (100 or greater) body aches, chills, sore throat, or sudden loss of taste or sense of smell? no . Have you been around anyone with known Covid 19? no . Have you been around anyone who is awaiting Covid 19 test results in the past 7 to 10 days? no . Have you been around anyone who has been exposed to Covid 19, or has mentioned symptoms of Covid 19 within the past 7 to 10 days? no    I reviewed procedure/mask/visitor instructions, Covid-19 screening questions with patient, he verbalized understanding, thanked me for call.

## 2019-10-19 NOTE — H&P (Signed)
Syncope W/U revealed decreased LV function with apical wma

## 2019-10-20 ENCOUNTER — Encounter (HOSPITAL_COMMUNITY)
Admission: RE | Disposition: A | Discharge status: Home / Self Care | Payer: Self-pay | Source: Home / Self Care | Attending: Interventional Cardiology

## 2019-10-20 ENCOUNTER — Encounter (HOSPITAL_COMMUNITY): Payer: Self-pay | Admitting: Interventional Cardiology

## 2019-10-20 ENCOUNTER — Other Ambulatory Visit: Payer: Self-pay

## 2019-10-20 ENCOUNTER — Ambulatory Visit (HOSPITAL_COMMUNITY)
Admission: RE | Admit: 2019-10-20 | Discharge: 2019-10-20 | Disposition: A | Discharge status: Home / Self Care | Payer: Medicare Other | Attending: Interventional Cardiology | Admitting: Interventional Cardiology

## 2019-10-20 DIAGNOSIS — Z7982 Long term (current) use of aspirin: Secondary | ICD-10-CM | POA: Diagnosis not present

## 2019-10-20 DIAGNOSIS — F17210 Nicotine dependence, cigarettes, uncomplicated: Secondary | ICD-10-CM | POA: Insufficient documentation

## 2019-10-20 DIAGNOSIS — E785 Hyperlipidemia, unspecified: Secondary | ICD-10-CM | POA: Diagnosis not present

## 2019-10-20 DIAGNOSIS — G4733 Obstructive sleep apnea (adult) (pediatric): Secondary | ICD-10-CM | POA: Diagnosis not present

## 2019-10-20 DIAGNOSIS — I5042 Chronic combined systolic (congestive) and diastolic (congestive) heart failure: Secondary | ICD-10-CM | POA: Insufficient documentation

## 2019-10-20 DIAGNOSIS — R931 Abnormal findings on diagnostic imaging of heart and coronary circulation: Secondary | ICD-10-CM | POA: Insufficient documentation

## 2019-10-20 DIAGNOSIS — I252 Old myocardial infarction: Secondary | ICD-10-CM | POA: Insufficient documentation

## 2019-10-20 DIAGNOSIS — G2581 Restless legs syndrome: Secondary | ICD-10-CM | POA: Insufficient documentation

## 2019-10-20 DIAGNOSIS — R7303 Prediabetes: Secondary | ICD-10-CM | POA: Diagnosis not present

## 2019-10-20 DIAGNOSIS — I110 Hypertensive heart disease with heart failure: Secondary | ICD-10-CM | POA: Insufficient documentation

## 2019-10-20 DIAGNOSIS — R55 Syncope and collapse: Secondary | ICD-10-CM

## 2019-10-20 DIAGNOSIS — J449 Chronic obstructive pulmonary disease, unspecified: Secondary | ICD-10-CM | POA: Diagnosis not present

## 2019-10-20 DIAGNOSIS — M069 Rheumatoid arthritis, unspecified: Secondary | ICD-10-CM | POA: Insufficient documentation

## 2019-10-20 DIAGNOSIS — I519 Heart disease, unspecified: Secondary | ICD-10-CM

## 2019-10-20 DIAGNOSIS — I25118 Atherosclerotic heart disease of native coronary artery with other forms of angina pectoris: Secondary | ICD-10-CM | POA: Diagnosis not present

## 2019-10-20 DIAGNOSIS — Z79899 Other long term (current) drug therapy: Secondary | ICD-10-CM | POA: Diagnosis not present

## 2019-10-20 DIAGNOSIS — K219 Gastro-esophageal reflux disease without esophagitis: Secondary | ICD-10-CM | POA: Insufficient documentation

## 2019-10-20 DIAGNOSIS — Z6839 Body mass index (BMI) 39.0-39.9, adult: Secondary | ICD-10-CM | POA: Diagnosis not present

## 2019-10-20 DIAGNOSIS — I2510 Atherosclerotic heart disease of native coronary artery without angina pectoris: Secondary | ICD-10-CM | POA: Diagnosis not present

## 2019-10-20 DIAGNOSIS — F0390 Unspecified dementia without behavioral disturbance: Secondary | ICD-10-CM | POA: Insufficient documentation

## 2019-10-20 DIAGNOSIS — E669 Obesity, unspecified: Secondary | ICD-10-CM | POA: Insufficient documentation

## 2019-10-20 DIAGNOSIS — F419 Anxiety disorder, unspecified: Secondary | ICD-10-CM | POA: Insufficient documentation

## 2019-10-20 DIAGNOSIS — M109 Gout, unspecified: Secondary | ICD-10-CM | POA: Diagnosis not present

## 2019-10-20 HISTORY — PX: LEFT HEART CATH AND CORONARY ANGIOGRAPHY: CATH118249

## 2019-10-20 HISTORY — PX: CORONARY PRESSURE/FFR STUDY: CATH118243

## 2019-10-20 LAB — POCT ACTIVATED CLOTTING TIME
Activated Clotting Time: 246 seconds
Activated Clotting Time: 252 seconds

## 2019-10-20 SURGERY — LEFT HEART CATH AND CORONARY ANGIOGRAPHY
Anesthesia: LOCAL

## 2019-10-20 MED ORDER — NITROGLYCERIN 1 MG/10 ML FOR IR/CATH LAB
INTRA_ARTERIAL | Status: AC
  Filled 2019-10-20: qty 10

## 2019-10-20 MED ORDER — VERAPAMIL HCL 2.5 MG/ML IV SOLN
INTRAVENOUS | Status: AC
  Filled 2019-10-20: qty 2

## 2019-10-20 MED ORDER — ASPIRIN 81 MG PO CHEW: 81.0000 mg | CHEWABLE_TABLET | Freq: Every day | ORAL | Status: DC

## 2019-10-20 MED ORDER — LIDOCAINE HCL (PF) 1 % IJ SOLN
INTRAMUSCULAR | Status: DC | PRN
  Administered 2019-10-20: 8 mL

## 2019-10-20 MED ORDER — VERAPAMIL HCL 2.5 MG/ML IV SOLN
INTRAVENOUS | Status: DC | PRN
  Administered 2019-10-20: 10 mL via INTRA_ARTERIAL

## 2019-10-20 MED ORDER — IOHEXOL 350 MG/ML SOLN
INTRAVENOUS | Status: DC | PRN
  Administered 2019-10-20: 80 mL

## 2019-10-20 MED ORDER — LIDOCAINE HCL (PF) 1 % IJ SOLN
INTRAMUSCULAR | Status: AC
  Filled 2019-10-20: qty 30

## 2019-10-20 MED ORDER — SODIUM CHLORIDE 0.9 % IV SOLN: 250.0000 mL | INTRAVENOUS | Status: DC | PRN

## 2019-10-20 MED ORDER — HEPARIN (PORCINE) IN NACL 1000-0.9 UT/500ML-% IV SOLN
INTRAVENOUS | Status: AC
  Filled 2019-10-20: qty 1000

## 2019-10-20 MED ORDER — SODIUM CHLORIDE 0.9% FLUSH: 3.0000 mL | Freq: Two times a day (BID) | INTRAVENOUS | Status: DC

## 2019-10-20 MED ORDER — SODIUM CHLORIDE 0.9% FLUSH: 3.0000 mL | INTRAVENOUS | Status: DC | PRN

## 2019-10-20 MED ORDER — HEPARIN SODIUM (PORCINE) 1000 UNIT/ML IJ SOLN
INTRAMUSCULAR | Status: AC
  Filled 2019-10-20: qty 1

## 2019-10-20 MED ORDER — FENTANYL CITRATE (PF) 100 MCG/2ML IJ SOLN
INTRAMUSCULAR | Status: AC
  Filled 2019-10-20: qty 2

## 2019-10-20 MED ORDER — ONDANSETRON HCL 4 MG/2ML IJ SOLN: 4.0000 mg | Freq: Four times a day (QID) | INTRAMUSCULAR | Status: DC | PRN

## 2019-10-20 MED ORDER — ACETAMINOPHEN 325 MG PO TABS: 650.0000 mg | ORAL_TABLET | ORAL | Status: DC | PRN

## 2019-10-20 MED ORDER — SODIUM CHLORIDE 0.9 % IV SOLN: INTRAVENOUS | Status: DC

## 2019-10-20 MED ORDER — ASPIRIN 81 MG PO CHEW: 81.0000 mg | CHEWABLE_TABLET | Freq: Once | ORAL | Status: DC

## 2019-10-20 MED ORDER — SODIUM CHLORIDE 0.9 % WEIGHT BASED INFUSION: 1.0000 mL/kg/h | INTRAVENOUS | Status: DC

## 2019-10-20 MED ORDER — MIDAZOLAM HCL 2 MG/2ML IJ SOLN
INTRAMUSCULAR | Status: AC
  Filled 2019-10-20: qty 2

## 2019-10-20 MED ORDER — MIDAZOLAM HCL 2 MG/2ML IJ SOLN
INTRAMUSCULAR | Status: DC | PRN
  Administered 2019-10-20: 0.5 mg via INTRAVENOUS

## 2019-10-20 MED ORDER — FENTANYL CITRATE (PF) 100 MCG/2ML IJ SOLN
INTRAMUSCULAR | Status: DC | PRN
  Administered 2019-10-20: 25 ug via INTRAVENOUS

## 2019-10-20 MED ORDER — HYDRALAZINE HCL 20 MG/ML IJ SOLN: 10.0000 mg | INTRAMUSCULAR | Status: DC | PRN

## 2019-10-20 MED ORDER — LABETALOL HCL 5 MG/ML IV SOLN: 10.0000 mg | INTRAVENOUS | Status: DC | PRN

## 2019-10-20 MED ORDER — HEPARIN SODIUM (PORCINE) 1000 UNIT/ML IJ SOLN
INTRAMUSCULAR | Status: DC | PRN
  Administered 2019-10-20 (×2): 6000 [IU] via INTRAVENOUS
  Administered 2019-10-20: 2500 [IU] via INTRAVENOUS

## 2019-10-20 MED ORDER — HEPARIN (PORCINE) IN NACL 1000-0.9 UT/500ML-% IV SOLN
INTRAVENOUS | Status: DC | PRN
  Administered 2019-10-20 (×2): 500 mL

## 2019-10-20 MED ORDER — SODIUM CHLORIDE 0.9 % WEIGHT BASED INFUSION
3.0000 mL/kg/h | INTRAVENOUS | Status: AC
  Administered 2019-10-20: 3 mL/kg/h via INTRAVENOUS

## 2019-10-20 SURGICAL SUPPLY — 13 items
CATH 5FR JL3.5 JR4 ANG PIG MP (CATHETERS) ×2 IMPLANT
CATH VISTA GUIDE 6FR XBLAD3.5 (CATHETERS) ×2 IMPLANT
DEVICE RAD COMP TR BAND LRG (VASCULAR PRODUCTS) ×2 IMPLANT
GLIDESHEATH SLEND A-KIT 6F 22G (SHEATH) ×2 IMPLANT
GUIDEWIRE INQWIRE 1.5J.035X260 (WIRE) ×1 IMPLANT
GUIDEWIRE PRESSURE COMET II (WIRE) ×2 IMPLANT
INQWIRE 1.5J .035X260CM (WIRE) ×2
KIT ESSENTIALS PG (KITS) ×2 IMPLANT
KIT HEART LEFT (KITS) ×2 IMPLANT
PACK CARDIAC CATHETERIZATION (CUSTOM PROCEDURE TRAY) ×2 IMPLANT
SHEATH PROBE COVER 6X72 (BAG) ×2 IMPLANT
TRANSDUCER W/STOPCOCK (MISCELLANEOUS) ×2 IMPLANT
TUBING CIL FLEX 10 FLL-RA (TUBING) ×2 IMPLANT

## 2019-10-20 NOTE — Interval H&P Note (Signed)
Cath Lab Visit (complete for each Cath Lab visit)  Clinical Evaluation Leading to the Procedure:   ACS: No.  Non-ACS:    Anginal Classification: No Symptoms  Anti-ischemic medical therapy: Minimal Therapy (1 class of medications)  Non-Invasive Test Results: No non-invasive testing performed  Prior CABG: No previous CABG      History and Physical Interval Note:  10/20/2019 9:03 AM  Shawn Osborn  has presented today for surgery, with the diagnosis of Depessed ejection fraction.  The various methods of treatment have been discussed with the patient and family. After consideration of risks, benefits and other options for treatment, the patient has consented to  Procedure(s): LEFT HEART CATH AND CORONARY ANGIOGRAPHY (N/A) as a surgical intervention.  The patient's history has been reviewed, patient examined, no change in status, stable for surgery.  I have reviewed the patient's chart and labs.  Questions were answered to the patient's satisfaction.     Belva Crome III

## 2019-10-20 NOTE — CV Procedure (Signed)
   Left heart cath with coronary angiography and DFR LAD via right radial approach using real-time vascular ultrasound for access.  60 to 80% mid LAD after large first diagonal investigated with DFR with a value of 0.90-.91 (hemodynamic significance </= 0.89.  No obvious wall motion abnormalities.  LVEF 50%.  LVEDP 22 mmHg.

## 2019-10-20 NOTE — Discharge Instructions (Signed)
Radial Site Care ° °This sheet gives you information about how to care for yourself after your procedure. Your health care provider may also give you more specific instructions. If you have problems or questions, contact your health care provider. °What can I expect after the procedure? °After the procedure, it is common to have: °· Bruising and tenderness at the catheter insertion area. °Follow these instructions at home: °Medicines °· Take over-the-counter and prescription medicines only as told by your health care provider. °Insertion site care °· Follow instructions from your health care provider about how to take care of your insertion site. Make sure you: °? Wash your hands with soap and water before you change your bandage (dressing). If soap and water are not available, use hand sanitizer. °? Change your dressing as told by your health care provider. °? Leave stitches (sutures), skin glue, or adhesive strips in place. These skin closures may need to stay in place for 2 weeks or longer. If adhesive strip edges start to loosen and curl up, you may trim the loose edges. Do not remove adhesive strips completely unless your health care provider tells you to do that. °· Check your insertion site every day for signs of infection. Check for: °? Redness, swelling, or pain. °? Fluid or blood. °? Pus or a bad smell. °? Warmth. °· Do not take baths, swim, or use a hot tub until your health care provider approves. °· You may shower 24-48 hours after the procedure, or as directed by your health care provider. °? Remove the dressing and gently wash the site with plain soap and water. °? Pat the area dry with a clean towel. °? Do not rub the site. That could cause bleeding. °· Do not apply powder or lotion to the site. °Activity ° °· For 24 hours after the procedure, or as directed by your health care provider: °? Do not flex or bend the affected arm. °? Do not push or pull heavy objects with the affected arm. °? Do not  drive yourself home from the hospital or clinic. You may drive 24 hours after the procedure unless your health care provider tells you not to. °? Do not operate machinery or power tools. °· Do not lift anything that is heavier than 10 lb (4.5 kg), or the limit that you are told, until your health care provider says that it is safe. °· Ask your health care provider when it is okay to: °? Return to work or school. °? Resume usual physical activities or sports. °? Resume sexual activity. °General instructions °· If the catheter site starts to bleed, raise your arm and put firm pressure on the site. If the bleeding does not stop, get help right away. This is a medical emergency. °· If you went home on the same day as your procedure, a responsible adult should be with you for the first 24 hours after you arrive home. °· Keep all follow-up visits as told by your health care provider. This is important. °Contact a health care provider if: °· You have a fever. °· You have redness, swelling, or yellow drainage around your insertion site. °Get help right away if: °· You have unusual pain at the radial site. °· The catheter insertion area swells very fast. °· The insertion area is bleeding, and the bleeding does not stop when you hold steady pressure on the area. °· Your arm or hand becomes pale, cool, tingly, or numb. °These symptoms may represent a serious problem   that is an emergency. Do not wait to see if the symptoms will go away. Get medical help right away. Call your local emergency services (911 in the [place] Do not drive yourself to the hospital. °Summary °· After the procedure, it is common to have bruising and tenderness at the site. °· Follow instructions from your health care provider about how to take care of your radial site wound. Check the wound every day for signs of infection. °· Do not lift anything that is heavier than 10 lb (4.5 kg), or the limit that you are told, until your health care provider says  that it is safe. °This information is not intended to replace advice given to you by your health care provider. Make sure you discuss any questions you have with your health care provider. °Document Released: 12/08/2010 Document Revised: 12/11/2017 Document Reviewed: 12/11/2017 °Elsevier Patient Education © 2020 [employer] ° °

## 2019-10-20 NOTE — Progress Notes (Signed)
Patient took 81 mg ASA at 0600 10/20/19

## 2019-10-26 DIAGNOSIS — E782 Mixed hyperlipidemia: Secondary | ICD-10-CM | POA: Diagnosis not present

## 2019-10-26 DIAGNOSIS — R7301 Impaired fasting glucose: Secondary | ICD-10-CM | POA: Diagnosis not present

## 2019-10-26 DIAGNOSIS — J9611 Chronic respiratory failure with hypoxia: Secondary | ICD-10-CM | POA: Diagnosis not present

## 2019-10-26 DIAGNOSIS — J410 Simple chronic bronchitis: Secondary | ICD-10-CM | POA: Diagnosis not present

## 2019-10-26 DIAGNOSIS — I110 Hypertensive heart disease with heart failure: Secondary | ICD-10-CM | POA: Diagnosis not present

## 2019-10-26 DIAGNOSIS — G894 Chronic pain syndrome: Secondary | ICD-10-CM | POA: Diagnosis not present

## 2019-10-26 DIAGNOSIS — M10049 Idiopathic gout, unspecified hand: Secondary | ICD-10-CM | POA: Diagnosis not present

## 2019-10-26 DIAGNOSIS — M48061 Spinal stenosis, lumbar region without neurogenic claudication: Secondary | ICD-10-CM | POA: Diagnosis not present

## 2019-10-28 DIAGNOSIS — R55 Syncope and collapse: Secondary | ICD-10-CM

## 2019-11-16 ENCOUNTER — Other Ambulatory Visit: Payer: Self-pay

## 2019-11-16 ENCOUNTER — Ambulatory Visit (INDEPENDENT_AMBULATORY_CARE_PROVIDER_SITE_OTHER): Payer: Medicare Other | Admitting: Cardiology

## 2019-11-16 ENCOUNTER — Encounter: Payer: Self-pay | Admitting: Cardiology

## 2019-11-16 VITALS — BP 152/80 | HR 87 | Ht 71.0 in | Wt 284.0 lb

## 2019-11-16 DIAGNOSIS — I519 Heart disease, unspecified: Secondary | ICD-10-CM

## 2019-11-16 DIAGNOSIS — R55 Syncope and collapse: Secondary | ICD-10-CM

## 2019-11-16 DIAGNOSIS — E785 Hyperlipidemia, unspecified: Secondary | ICD-10-CM

## 2019-11-16 DIAGNOSIS — I10 Essential (primary) hypertension: Secondary | ICD-10-CM

## 2019-11-16 DIAGNOSIS — I2510 Atherosclerotic heart disease of native coronary artery without angina pectoris: Secondary | ICD-10-CM | POA: Diagnosis not present

## 2019-11-16 NOTE — Patient Instructions (Signed)
Medication Instructions:  Your physician recommends that you continue on your current medications as directed. Please refer to the Current Medication list given to you today.  *If you need a refill on your cardiac medications before your next appointment, please call your pharmacy*  Lab Work: Your physician recommends that you return for lab work in:   BEFORE NEXT VISIT: Bmp,Magnesium,fasting lipid  If you have labs (blood work) drawn today and your tests are completely normal, you will receive your results only by: Marland Kitchen MyChart Message (if you have MyChart) OR . A paper copy in the mail If you have any lab test that is abnormal or we need to change your treatment, we will call you to review the results.  Testing/Procedures: None  Follow-Up: At [facility], you and your health needs are our priority.  As part of our continuing mission to provide you with exceptional heart care, we have created designated Provider Care Teams.  These Care Teams include your primary Cardiologist (physician) and Advanced Practice Providers (APPs -  Physician Assistants and Nurse Practitioners) who all work together to provide you with the care you need, when you need it.  Your next appointment:   6 month(s)  The format for your next appointment:   In Person  Provider:   Berniece Salines, DO  Other Instructions

## 2019-11-16 NOTE — Progress Notes (Signed)
Cardiology Office Note:    Date:  11/16/2019   ID:  Shawn Osborn, DOB 04/15/1951, MRN [id number]  PCP:  Rochel Brome, MD  Cardiologist:  Berniece Salines, DO  Electrophysiologist:  None   Referring MD: Rochel Brome, MD   Follow up visit  History of Present Illness:    Shawn Osborn is a 68 y.o. male with a hx of hypertension, hyperlipidemia, tobacco abuse and recurrent syncope episodes.  The patient was seen by me on October 20, 2019 at that time I recommended he undergo left heart catheterization to rule out coronary artery disease as etiology of his syncope especially in the setting of his depressed left ventricular systolic function and an event monitor was placed on the patient.   Since his last visit and the decrease in his blood pressure regimen we has not had a syncope episode   Past Medical History:  Diagnosis Date  . Acute combined systolic and diastolic CHF, NYHA class 3 ([place]) 10/08/2019  . Acute delirium 10/08/2019  . Acute exacerbation of CHF (congestive heart failure) ([place]) 10/08/2019  . Acute gout of multiple sites 07/01/2016  . Acute respiratory failure with hypoxia and hypercapnia ([place]) 10/08/2019  . Allergy   . Anxiety   . Arthritis    Gout, osteoarthritis in Back and Knees  . Cataract   . CHF (congestive heart failure) ([place])   . Chronic bronchitis   . Community acquired pneumonia 10/08/2019  . COPD (chronic obstructive pulmonary disease) ([place]) 10/08/2019  . Dementia ([place]) 10/08/2019  . Elevated troponin 10/08/2019  . Emphysema of lung ([place])   . Essential hypertension 07/03/2016  . GERD (gastroesophageal reflux disease)   . History of kidney stones   . HLD (hyperlipidemia) 10/08/2019  . Hypercapnic respiratory failure ([place]) 10/08/2019  . Hyperlipidemia   . Hypertension   . Hypertensive heart disease 10/08/2019  . Morbid obesity due to excess calories ([place]) 10/08/2019  . Narcotic overdose ([place]) 10/08/2019  . 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 NSTEMI (non-ST elevated myocardial infarction) ([place]) 10/08/2019  . Obstructive sleep apnea   . Occlusion and stenosis of carotid artery without mention of cerebral infarction 12/13/2011  . OSA on CPAP 10/08/2019  . Pain in joint of left shoulder 01/12/2019  . Paranoia (psychosis) ([place]) 07/01/2016  . Poorly-controlled hypertension 10/08/2019  . Prediabetes   . Psychotic disorder with delusions ([place]) 07/01/2016  . Respiratory failure with hypoxia and hypercapnia ([place]) 10/08/2019  . Restless leg syndrome 07/03/2016  . Rheumatoid arthritis(714.0)   . RLS (restless legs syndrome)   . Sepsis ([place]) 10/08/2019  . Sleep apnea   . Spinal stenosis   . Syncope 04/03/2019  . Tear of left rotator cuff 01/12/2019  . Toxic metabolic encephalopathy [id number]  . UTI (urinary tract infection) 10/08/2019  . Vascular dementia with behavioral disturbance ([place]) 07/06/2016    Past Surgical History:  Procedure Laterality Date  . APPENDECTOMY    . CARPAL TUNNEL RELEASE    . EYE SURGERY    . INTRAVASCULAR PRESSURE WIRE/FFR STUDY N/A 10/20/2019   Procedure: INTRAVASCULAR PRESSURE WIRE/FFR STUDY;  Surgeon: Belva Crome, MD;  Location: [facility];  Service: Cardiovascular;  Laterality: N/A;  . LEFT HEART CATH AND CORONARY ANGIOGRAPHY N/A 10/20/2019   Procedure: LEFT HEART CATH AND CORONARY ANGIOGRAPHY;  Surgeon: Belva Crome, MD;  Location: [facility];  Service: Cardiovascular;  Laterality: N/A;  . [place], 2003, 2012  Laminectomy X 3    Current Medications: Current Meds  Medication Sig  . albuterol (VENTOLIN HFA) 108 (90 Base) MCG/ACT inhaler Inhale 2 puffs into the lungs every 6 (six) hours as needed for wheezing or shortness of breath.  . allopurinol (ZYLOPRIM) 300 MG tablet Take 300 mg by mouth daily.  . Aspirin-Acetaminophen-Caffeine (GOODY HEADACHE PO) Take 1 packet by mouth 2 (two) times daily as needed (headaches).  . Colchicine 0.6 MG CAPS  Take 0.6 mg by mouth daily.  Marland Kitchen donepezil (ARICEPT) 5 MG tablet Take 5 mg by mouth at bedtime.  . formoterol (PERFOROMIST) 20 MCG/2ML nebulizer solution Take 20 mcg by nebulization daily.   . furosemide (LASIX) 20 MG tablet Take as needed (Patient taking differently: Take 20 mg by mouth daily as needed for fluid. )  . metoprolol succinate (TOPROL-XL) 25 MG 24 hr tablet Take 6.25-12.5 mg by mouth daily as needed (if systolic bp is over [id number] or if heart is racing).  . neomycin-bacitracin-polymyxin (NEOSPORIN) [phone] ointment Apply 1 application topically as needed (wound care).  Marland Kitchen omeprazole (PRILOSEC) 40 MG capsule Take 40 mg by mouth daily.  Marland Kitchen oxyCODONE (ROXICODONE) 15 MG immediate release tablet Take 15 mg by mouth every 6 (six) hours as needed for pain.  . pregabalin (LYRICA) 150 MG capsule Take 150 mg by mouth 2 (two) times daily.  . Revefenacin (YUPELRI) 175 MCG/3ML SOLN Inhale 3 mLs into the lungs every evening.  . rosuvastatin (CRESTOR) 5 MG tablet Take 5 mg by mouth at bedtime.   . sertraline (ZOLOFT) 100 MG tablet Take 100 mg by mouth at bedtime.   . valsartan (DIOVAN) 40 MG tablet Take 40 mg by mouth as needed. Take if BP is greater than 140     Allergies:   Avelox [moxifloxacin hcl in nacl], Codeine phosphate, Cozaar, Cymbalta [duloxetine hcl], Ropinirole, Statins, Welchol [colesevelam hcl], and Penicillins   Social History   Socioeconomic History  . Marital status: Widowed    Spouse name: Not on file  . Number of children: 4  . Years of education: Not on file  . Highest education level: Not on file  Occupational History  . Not on file  Tobacco Use  . Smoking status: Current Every Day Smoker    Packs/day: 2.00    Years: 53.00    Pack years: 106.00    Last attempt to quit: 11/06/2018    Years since quitting: 1.0  . Smokeless tobacco: Current User    Types: Chew  Substance and Sexual Activity  . Alcohol use: No  . Drug use: No  . Sexual activity: Not on file  Other  Topics Concern  . Not on file  Social History Narrative  . Not on file   Social Determinants of Health   Financial Resource Strain:   . Difficulty of Paying Living Expenses: Not on file  Food Insecurity:   . Worried About Charity fundraiser in the Last Year: Not on file  . Ran Out of Food in the Last Year: Not on file  Transportation Needs:   . Lack of Transportation (Medical): Not on file  . Lack of Transportation (Non-Medical): Not on file  Physical Activity:   . Days of Exercise per Week: Not on file  . Minutes of Exercise per Session: Not on file  Stress:   . Feeling of Stress : Not on file  Social Connections:   . Frequency of Communication with Friends and Family: Not on file  . Frequency of  Social Gatherings with Friends and Family: Not on file  . Attends Religious Services: Not on file  . Active Member of Clubs or Organizations: Not on file  . Attends Archivist Meetings: Not on file  . Marital Status: Not on file     Family History: The patient's family history includes Heart disease in his mother; Hypertension in his mother.  ROS:   Review of Systems  Constitution: Negative for decreased appetite, fever and weight gain.  HENT: Negative for congestion, ear discharge, hoarse voice and sore throat.   Eyes: Negative for discharge, redness, vision loss in right eye and visual halos.  Cardiovascular: Negative for chest pain, dyspnea on exertion, leg swelling, orthopnea and palpitations.  Respiratory: Negative for cough, hemoptysis, shortness of breath and snoring.   Endocrine: Negative for heat intolerance and polyphagia.  Hematologic/Lymphatic: Negative for bleeding problem. Does not bruise/bleed easily.  Skin: Negative for flushing, nail changes, rash and suspicious lesions.  Musculoskeletal: Negative for arthritis, joint pain, muscle cramps, myalgias, neck pain and stiffness.  Gastrointestinal: Negative for abdominal pain, bowel incontinence, diarrhea and  excessive appetite.  Genitourinary: Negative for decreased libido, genital sores and incomplete emptying.  Neurological: Negative for brief paralysis, focal weakness, headaches and loss of balance.  Psychiatric/Behavioral: Negative for altered mental status, depression and suicidal ideas.  Allergic/Immunologic: Negative for HIV exposure and persistent infections.    EKGs/Labs/Other Studies Reviewed:    The following studies were reviewed today:   EKG:  The ekg ordered today demonstrates sinus rhythm, heart rate 87 bpm similar compared to prior EKG.  Echocardiogram on August 24, 2019 reports mild concentric left ventricular hypertrophy. Moderate hypokinesis with apical akinesis.  His overall left ventricular systolic function was 40 to 45%.  With a pseudonormal LV filling pattern consistent with elevated left atrial pressure.  Left atrium was mildly dilated.   LHC  Intermediate stenosis between 36 and 85% in the mid LAD.  Physiologic testing with DFR was negative for reduced flow with values of 0.9 and 0.91 (threshold for ischemia is 0.89 and below).  Widely patent left main  Widely patent LAD other than as noted above  Widely patent circumflex coronary artery  Luminal irregularities in a dominant right coronary.  Mid PDA contains an eccentric 70% stenosis.  Normal left ventricular wall motion with EF 50%.  EDP 22 mmHg.  RECOMMENDATIONS:   Aggressive preventive therapy given the patient's documented coronary artery disease.  Anti-ischemic therapy as clinically indicated.  If develops angina, the LAD could be treated relatively safely.   Event monitor 10/09/2019 The patient wore the event monitor for 20 days with monitoring period between 10/09/2019 - 10/28/2019. Indication: Syncope The minimum heart rate was 57 bpm, maximum heart rate was 108 bpm, and average heart rate was 73 bpm. Predominant underlying rhythm was Sinus Rhythm.   Premature atrial complexes were rare.  No premature ventricular complexes were noted. No pauses, No second or third degree AV block and no atrial fibrillation present.  There were 0 critical, 0 serious, and 2 stable events that occurred.   Stable events noted as below:               Manually Detected patient triggered events:  1 Stable: Sinus Rhythm                Automatically Detected Events: 1 Stable: Sinus Arrhythmia w/PACs/Artifact  Conclusion: This study is remarkable for sinus arrhythmia with premature atrial contractions.  Recent Labs: 10/09/2019: BUN 13; Creatinine, Ser 0.76;  Hemoglobin 10.7; Platelets 415; Potassium 4.7; Sodium 144  Recent Lipid Panel No results found for: CHOL, TRIG, HDL, CHOLHDL, VLDL, LDLCALC, LDLDIRECT  Physical Exam:    VS:  BP (!) 152/80   Pulse 87   Ht 5\' 11"  (1.803 m)   Wt 284 lb (128.8 kg)   BMI 39.61 kg/m     Wt Readings from Last 3 Encounters:  11/16/19 284 lb (128.8 kg)  10/20/19 278 lb (126.1 kg)  10/09/19 284 lb (128.8 kg)     GEN: Well nourished, well developed in no acute distress HEENT: Normal NECK: No JVD; No carotid bruits LYMPHATICS: No lymphadenopathy CARDIAC: S1S2 noted,RRR, no murmurs, rubs, gallops RESPIRATORY:  Clear to auscultation without rales, wheezing or rhonchi  ABDOMEN: Soft, non-tender, non-distended, +bowel sounds, no guarding. EXTREMITIES: No edema, No cyanosis, no clubbing MUSCULOSKELETAL:  No edema; No deformity  SKIN: Warm and dry NEUROLOGIC:  Alert and oriented x 3, non-focal PSYCHIATRIC:  Normal affect, good insight  ASSESSMENT:    1. Syncope and collapse   2. Essential hypertension   3. Coronary artery disease involving native coronary artery of native heart without angina pectoris   4. Morbid obesity due to excess calories ([place])   5. Hyperlipidemia, unspecified hyperlipidemia type   6. Depressed left ventricular systolic function    PLAN:    1.  He reports that he has been doing well cardiovascular standpoint.  He denies any  repeat syncope episode.  Today at his visit was able to discuss the results with him of his cardiac catheterization as well as his event monitor.  He does have coronary artery disease by angiography but with a negative FFR therefore we will continue with aggressive medication management.  He is on aspirin 1 mg, Crestor 5 mg and Toprol-XL 25 mg daily.  We will continue this for now, repeat lipid profile and if LDL is not less then 70 will optimize his statin therapy.  I am also considering the use of PCSK9 inhibitors as he does expresses nephric and myalgia with a higher dose than 5 mg Crestor.  2.  His syncope I do suspect was likely in the setting of low blood pressure.  He tells me his blood pressures running 130s at home.  He is on a much more decreased blood pressure regimen therefore at this time we will keep him on his current regimen.  Is in the office today his blood pressure is slightly on the higher side at 152/80 mmHg but the patient states at home he is usually running between 130s to 135.  Of note stated above I discussed his event monitor with him.  3.  Depressed LV systolic function-he will remain on low-dose beta-blocker and valsartan 40 mg for now.  The patient is in agreement with the above plan. The patient left the office in stable condition.  The patient will follow up in 6 months or sooner if needed   Medication Adjustments/Labs and Tests Ordered: Current medicines are reviewed at length with the patient today.  Concerns regarding medicines are outlined above.  Orders Placed This Encounter  Procedures  . Basic Metabolic Panel (BMET)  . Magnesium  . Lipid Profile  . EKG 12/Charge capture   No orders of the defined types were placed in this encounter.   Patient Instructions  Medication Instructions:  Your physician recommends that you continue on your current medications as directed. Please refer to the Current Medication list given to you today.  *If you need a refill  on  your cardiac medications before your next appointment, please call your pharmacy*  Lab Work: Your physician recommends that you return for lab work in:   BEFORE NEXT VISIT: Bmp,Magnesium,fasting lipid  If you have labs (blood work) drawn today and your tests are completely normal, you will receive your results only by: Marland Kitchen MyChart Message (if you have MyChart) OR . A paper copy in the mail If you have any lab test that is abnormal or we need to change your treatment, we will call you to review the results.  Testing/Procedures: None  Follow-Up: At [facility], you and your health needs are our priority.  As part of our continuing mission to provide you with exceptional heart care, we have created designated Provider Care Teams.  These Care Teams include your primary Cardiologist (physician) and Advanced Practice Providers (APPs -  Physician Assistants and Nurse Practitioners) who all work together to provide you with the care you need, when you need it.  Your next appointment:   6 month(s)  The format for your next appointment:   In Person  Provider:   Berniece Salines, DO  Other Instructions      Adopting a Healthy Lifestyle.  Know what a healthy weight is for you (roughly BMI <25) and aim to maintain this   Aim for 7+ servings of fruits and vegetables daily   65-80+ fluid ounces of water or unsweet tea for healthy kidneys   Limit to max 1 drink of alcohol per day; avoid smoking/tobacco   Limit animal fats in diet for cholesterol and heart health - choose grass fed whenever available   Avoid highly processed foods, and foods high in saturated/trans fats   Aim for low stress - take time to unwind and care for your mental health   Aim for 150 min of moderate intensity exercise weekly for heart health, and weights twice weekly for bone health   Aim for 7-9 hours of sleep daily   When it comes to diets, agreement about the perfect plan isnt easy to find, even among the  experts. Experts at the [employer] developed an idea known as the Healthy Eating Plate. Just imagine a plate divided into logical, healthy portions.   The emphasis is on diet quality:   Load up on vegetables and fruits - one-half of your plate: Aim for color and variety, and remember that potatoes dont count.   Go for whole grains - one-quarter of your plate: Whole wheat, barley, wheat berries, quinoa, oats, brown rice, and foods made with them. If you want pasta, go with whole wheat pasta.   Protein power - one-quarter of your plate: Fish, chicken, beans, and nuts are all healthy, versatile protein sources. Limit red meat.   The diet, however, does go beyond the plate, offering a few other suggestions.   Use healthy plant oils, such as olive, canola, soy, corn, sunflower and peanut. Check the labels, and avoid partially hydrogenated oil, which have unhealthy trans fats.   If youre thirsty, drink water. Coffee and tea are good in moderation, but skip sugary drinks and limit milk and dairy products to one or two daily servings.   The type of carbohydrate in the diet is more important than the amount. Some sources of carbohydrates, such as vegetables, fruits, whole grains, and beans-are healthier than others.   Finally, stay active  Signed, Berniece Salines, DO  11/16/2019 3:32 PM    [facility]

## 2019-11-27 DIAGNOSIS — J410 Simple chronic bronchitis: Secondary | ICD-10-CM | POA: Diagnosis not present

## 2019-11-27 DIAGNOSIS — J9611 Chronic respiratory failure with hypoxia: Secondary | ICD-10-CM | POA: Diagnosis not present

## 2019-11-30 DIAGNOSIS — M5106 Intervertebral disc disorders with myelopathy, lumbar region: Secondary | ICD-10-CM | POA: Diagnosis not present

## 2019-11-30 DIAGNOSIS — J441 Chronic obstructive pulmonary disease with (acute) exacerbation: Secondary | ICD-10-CM | POA: Diagnosis not present

## 2019-12-28 DIAGNOSIS — J410 Simple chronic bronchitis: Secondary | ICD-10-CM | POA: Diagnosis not present

## 2019-12-28 DIAGNOSIS — J9611 Chronic respiratory failure with hypoxia: Secondary | ICD-10-CM | POA: Diagnosis not present

## 2019-12-31 DIAGNOSIS — J441 Chronic obstructive pulmonary disease with (acute) exacerbation: Secondary | ICD-10-CM | POA: Diagnosis not present

## 2019-12-31 DIAGNOSIS — M5106 Intervertebral disc disorders with myelopathy, lumbar region: Secondary | ICD-10-CM | POA: Diagnosis not present

## 2020-01-13 ENCOUNTER — Other Ambulatory Visit: Payer: Self-pay | Admitting: Family Medicine

## 2020-01-25 DIAGNOSIS — J410 Simple chronic bronchitis: Secondary | ICD-10-CM | POA: Diagnosis not present

## 2020-01-25 DIAGNOSIS — J9611 Chronic respiratory failure with hypoxia: Secondary | ICD-10-CM | POA: Diagnosis not present

## 2020-01-26 ENCOUNTER — Other Ambulatory Visit: Payer: Self-pay

## 2020-01-26 ENCOUNTER — Encounter: Payer: Self-pay | Admitting: Family Medicine

## 2020-01-26 ENCOUNTER — Ambulatory Visit (INDEPENDENT_AMBULATORY_CARE_PROVIDER_SITE_OTHER): Payer: Medicare Other | Admitting: Family Medicine

## 2020-01-26 VITALS — BP 132/72 | HR 96 | Resp 20 | Ht 70.0 in | Wt 278.0 lb

## 2020-01-26 DIAGNOSIS — Z6839 Body mass index (BMI) 39.0-39.9, adult: Secondary | ICD-10-CM

## 2020-01-26 DIAGNOSIS — I10 Essential (primary) hypertension: Secondary | ICD-10-CM | POA: Diagnosis not present

## 2020-01-26 DIAGNOSIS — J410 Simple chronic bronchitis: Secondary | ICD-10-CM

## 2020-01-26 DIAGNOSIS — E782 Mixed hyperlipidemia: Secondary | ICD-10-CM | POA: Diagnosis not present

## 2020-01-26 DIAGNOSIS — I25118 Atherosclerotic heart disease of native coronary artery with other forms of angina pectoris: Secondary | ICD-10-CM

## 2020-01-26 DIAGNOSIS — F17219 Nicotine dependence, cigarettes, with unspecified nicotine-induced disorders: Secondary | ICD-10-CM

## 2020-01-26 DIAGNOSIS — I110 Hypertensive heart disease with heart failure: Secondary | ICD-10-CM

## 2020-01-26 DIAGNOSIS — M10042 Idiopathic gout, left hand: Secondary | ICD-10-CM | POA: Diagnosis not present

## 2020-01-26 MED ORDER — TRIAMCINOLONE ACETONIDE 40 MG/ML IJ SUSP
80.0000 mg | Freq: Once | INTRAMUSCULAR | Status: AC
  Administered 2020-01-26: 80 mg via INTRAMUSCULAR

## 2020-01-26 MED ORDER — PREGABALIN 75 MG PO CAPS: 75.0000 mg | ORAL_CAPSULE | Freq: Three times a day (TID) | ORAL | 2 refills | Status: DC

## 2020-01-26 MED ORDER — COLCHICINE 0.6 MG PO TABS: 0.6000 mg | ORAL_TABLET | Freq: Two times a day (BID) | ORAL | 3 refills | Status: DC

## 2020-01-26 NOTE — Patient Instructions (Addendum)
Plan:  Continue to work on eating a healthy diet and exercise. Labs drawn today.  Quit smoking. See below for plan for ACUTE TREATMENT OF GOUT.  Decrease Lyrica to 75 mg one three times a day in next blister pack.   Gout  Gout is painful swelling of your joints. Gout is a type of arthritis. It is caused by having too much uric acid in your body. Uric acid is a chemical that is made when your body breaks down substances called purines. If your body has too much uric acid, sharp crystals can form and build up in your joints. This causes pain and swelling. Gout attacks can happen quickly and be very painful (acute gout). Over time, the attacks can affect more joints and happen more often (chronic gout). What are the causes?  Too much uric acid in your blood. This can happen because: ? Your kidneys do not remove enough uric acid from your blood. ? Your body makes too much uric acid. ? You eat too many foods that are high in purines. These foods include organ meats, some seafood, and beer.  Trauma or stress. What increases the risk?  Having a family history of gout.  Being male and middle-aged.  Being male and having gone through menopause.  Being very overweight (obese).  Drinking alcohol, especially beer.  Not having enough water in the body (being dehydrated).  Losing weight too quickly.  Having an organ transplant.  Having lead poisoning.  Taking certain medicines.  Having kidney disease.  Having a skin condition called psoriasis. What are the signs or symptoms? An attack of acute gout usually happens in just one joint. The most common place is the big toe. Attacks often start at night. Other joints that may be affected include joints of the feet, ankle, knee, fingers, wrist, or elbow. Symptoms of an attack may include:  Very bad pain.  Warmth.  Swelling.  Stiffness.  Shiny, red, or purple skin.  Tenderness. The affected joint may be very painful to touch.   Chills and fever. Chronic gout may cause symptoms more often. More joints may be involved. You may also have white or yellow lumps (tophi) on your hands or feet or in other areas near your joints. How is this treated?  Treatment for this condition has two phases: treating an acute attack and preventing future attacks.  Acute gout treatment may include: ? Colchicine 0.6 mg one twice a day at onset of gout symptoms. When you have a flare up, start immediately and take for one week.   Preventive treatment may include:  ALLOPURINOL 300 MG ONCE DAILY. Follow these instructions at home: During a gout attack   If told, put ice on the painful area: ? Put ice in a plastic bag. ? Place a towel between your skin and the bag. ? Leave the ice on for 20 minutes, 2-3 times a day.  Raise (elevate) the painful joint above the level of your heart as often as you can.  Rest the joint as much as possible. If the joint is in your leg, you may be given crutches.  Follow instructions from your doctor about what you cannot eat or drink. Avoiding future gout attacks  Eat a low-purine diet. Avoid foods and drinks such as: ? Liver. ? Kidney. ? Anchovies. ? Asparagus. ? Herring. ? Mushrooms. ? Mussels. ? Beer.  Stay at a healthy weight. If you want to lose weight, talk with your doctor. Do not lose weight too  fast.  Start or continue an exercise plan as told by your doctor. Eating and drinking  Drink enough fluids to keep your pee (urine) pale yellow.  If you drink alcohol: ? Limit how much you use to:  0-1 drink a day for women.  0-2 drinks a day for men. ? Be aware of how much alcohol is in your drink. In the [place] one drink equals one 12 oz bottle of beer (355 mL), one 5 oz glass of wine (148 mL), or one 1 oz glass of hard liquor (44 mL). General instructions  Take over-the-counter and prescription medicines only as told by your doctor.  Do not drive or use heavy machinery while taking  prescription pain medicine.  Return to your normal activities as told by your doctor. Ask your doctor what activities are safe for you.  Keep all follow-up visits as told by your doctor. This is important. Contact a doctor if:  You have another gout attack.  You still have symptoms of a gout attack after 10 days of treatment.  You have problems (side effects) because of your medicines.  You have chills or a fever.  You have burning pain when you pee (urinate).  You have pain in your lower back or belly. Get help right away if:  You have very bad pain.  Your pain cannot be controlled.  You cannot pee. Summary  Gout is painful swelling of the joints.  The most common site of pain is the big toe, but it can affect other joints.  Medicines and avoiding some foods can help to prevent and treat gout attacks. This information is not intended to replace advice given to you by your health care provider. Make sure you discuss any questions you have with your health care provider. Document Revised: 05/28/2018 Document Reviewed: 05/28/2018 Elsevier Patient Education  [place].

## 2020-01-27 ENCOUNTER — Other Ambulatory Visit: Payer: Self-pay | Admitting: Family Medicine

## 2020-01-27 MED ORDER — ARFORMOTEROL TARTRATE 15 MCG/2ML IN NEBU: 15.0000 ug | INHALATION_SOLUTION | Freq: Two times a day (BID) | RESPIRATORY_TRACT | 5 refills | Status: DC

## 2020-01-27 MED ORDER — ALBUTEROL SULFATE HFA 108 (90 BASE) MCG/ACT IN AERS: 2.0000 | INHALATION_SPRAY | Freq: Four times a day (QID) | RESPIRATORY_TRACT | 5 refills | Status: DC | PRN

## 2020-01-27 MED ORDER — YUPELRI 175 MCG/3ML IN SOLN: 3.0000 mL | Freq: Every evening | RESPIRATORY_TRACT | 5 refills | Status: DC

## 2020-01-27 NOTE — Progress Notes (Addendum)
Established Patient Office Visit  Subjective:  Patient ID: Shawn Osborn, male    DOB: Feb 05, 1951  Age: 69 y.o. MRN: [id number]  CC:  Chief Complaint  Patient presents with  . Hyperlipidemia  . Gout    HPI Shawn Osborn presents for 70-month follow-up of numerous medical issues. 1.  Patient has hypertensive heart disease with heart failure.  The patient had a cardiac cath in November/2020 which showed coronary artery disease.  He had a Holter monitor which was normal.  His echocardiogram shows evidence of congestive heart failure.  Per his last cardiac note he should be on aspirin 81 mg once daily, Toprol-XL 25 mg once daily valsartan 40 mg once daily and Crestor 5 mg once daily.  His current list of medications is not matching this as well discussed possibly with cardiology.  Patient has had no further syncopal episodes.  He had previously required significant medications to control his bp, but this was changed when he began having syncopal episodes in the fall. This was felt to be due to over medicating.   He is trying to eat healthier.  Denies exercise.  2) Patient had a gout flare up in Left hand x 1 week. He took oxycodone which helped, but did not have colchicine, nor did he call to notify us of his flare up. He is also complaining of left shoulder pain. It came on suddenly and hurts with abduction.  It is gradually improving.   3) Idiopathic peripheral neuropathy/RLS - Lyrica works great for these symptoms, but he has palpitations. He has stopped it and restarted to see if the lyrica causes the palpitations.    Past Medical History:  Diagnosis Date  . Acute combined systolic and diastolic CHF, NYHA class 3 ([place]) 10/08/2019  . Acute delirium 10/08/2019  . Acute exacerbation of CHF (congestive heart failure) ([place]) 10/08/2019  . Acute gout of multiple sites 07/01/2016  . Acute respiratory failure with hypoxia and hypercapnia ([place]) 10/08/2019  . Allergy   . Anxiety   .  Arthritis    Gout, osteoarthritis in Back and Knees  . Cataract   . CHF (congestive heart failure) ([place])   . Chronic bronchitis   . Community acquired pneumonia 10/08/2019  . COPD (chronic obstructive pulmonary disease) ([place]) 10/08/2019  . Dementia ([place]) 10/08/2019  . Elevated troponin 10/08/2019  . Emphysema of lung ([place])   . Essential hypertension 07/03/2016  . GERD (gastroesophageal reflux disease)   . History of kidney stones   . HLD (hyperlipidemia) 10/08/2019  . Hypercapnic respiratory failure ([place]) 10/08/2019  . Hyperlipidemia   . Hypertension   . Hypertensive heart disease 10/08/2019  . Morbid obesity due to excess calories ([place]) 10/08/2019  . Narcotic overdose ([place]) 10/08/2019  . 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 NSTEMI (non-ST elevated myocardial infarction) ([place]) 10/08/2019  . Obstructive sleep apnea   . Occlusion and stenosis of carotid artery without mention of cerebral infarction 12/13/2011  . OSA on CPAP 10/08/2019  . Pain in joint of left shoulder 01/12/2019  . Paranoia (psychosis) ([place]) 07/01/2016  . Poorly-controlled hypertension 10/08/2019  . Prediabetes   . Psychotic disorder with delusions ([place]) 07/01/2016  . Respiratory failure with hypoxia and hypercapnia ([place]) 10/08/2019  . Restless leg syndrome 07/03/2016  . Rheumatoid arthritis(714.0)   . RLS (restless legs syndrome)   . Sepsis ([place]) 10/08/2019  . Sleep apnea   . Spinal stenosis   . Syncope 04/03/2019  .  Tear of left rotator cuff 01/12/2019  . Toxic metabolic encephalopathy [id number]  . UTI (urinary tract infection) 10/08/2019  . Vascular dementia with behavioral disturbance ([place]) 07/06/2016    Past Surgical History:  Procedure Laterality Date  . APPENDECTOMY    . CARPAL TUNNEL RELEASE    . EYE SURGERY    . INTRAVASCULAR PRESSURE WIRE/FFR STUDY N/A 10/20/2019   Procedure: INTRAVASCULAR PRESSURE WIRE/FFR STUDY;  Surgeon: Belva Crome, MD;  Location: [facility];   Service: Cardiovascular;  Laterality: N/A;  . LEFT HEART CATH AND CORONARY ANGIOGRAPHY N/A 10/20/2019   Procedure: LEFT HEART CATH AND CORONARY ANGIOGRAPHY;  Surgeon: Belva Crome, MD;  Location: [facility];  Service: Cardiovascular;  Laterality: N/A;  . Shawn Osborn, 2003, 2012   Laminectomy X 3    Family History  Problem Relation Age of Onset  . Heart disease Mother   . Hypertension Mother     Social History   Socioeconomic History  . Marital status: Widowed    Spouse name: Not on file  . Number of children: 4  . Years of education: Not on file  . Highest education level: Not on file  Occupational History  . Not on file  Tobacco Use  . Smoking status: Current Every Day Smoker    Packs/day: 0.50    Years: 53.00    Pack years: 26.50    Last attempt to quit: 11/06/2018    Years since quitting: 1.2  . Smokeless tobacco: Current User    Types: Chew  Substance and Sexual Activity  . Alcohol use: No  . Drug use: No  . Sexual activity: Not on file  Other Topics Concern  . Not on file  Social History Narrative  . Not on file   Social Determinants of Health   Financial Resource Strain:   . Difficulty of Paying Living Expenses:   Food Insecurity:   . Worried About Charity fundraiser in the Last Year:   . Arboriculturist in the Last Year:   Transportation Needs:   . Film/video editor (Medical):   Marland Kitchen Lack of Transportation (Non-Medical):   Physical Activity:   . Days of Exercise per Week:   . Minutes of Exercise per Session:   Stress:   . Feeling of Stress :   Social Connections:   . Frequency of Communication with Friends and Family:   . Frequency of Social Gatherings with Friends and Family:   . Attends Religious Services:   . Active Member of Clubs or Organizations:   . Attends Archivist Meetings:   Marland Kitchen Marital Status:   Intimate Partner Violence:   . Fear of Current or Ex-Partner:   . Emotionally Abused:   Marland Kitchen Physically Abused:     . Sexually Abused:     Outpatient Medications Prior to Visit  Medication Sig Dispense Refill  . allopurinol (ZYLOPRIM) 300 MG tablet Take 300 mg by mouth daily.    Marland Kitchen donepezil (ARICEPT) 5 MG tablet Take 5 mg by mouth at bedtime.    Marland Kitchen albuterol (VENTOLIN HFA) 108 (90 Base) MCG/ACT inhaler Inhale 2 puffs into the lungs every 6 (six) hours as needed for wheezing or shortness of breath.    . formoterol (PERFOROMIST) 20 MCG/2ML nebulizer solution Take 20 mcg by nebulization daily.     Marland Kitchen neomycin-bacitracin-polymyxin (NEOSPORIN) [phone] ointment Apply 1 application topically as needed (wound care).    Marland Kitchen omeprazole (PRILOSEC) 40 MG capsule  Take 40 mg by mouth daily.    Marland Kitchen oxyCODONE (ROXICODONE) 15 MG immediate release tablet TAKE ONE TABLET BY MOUTH 4 TIMES DAILY 120 tablet 0  . rosuvastatin (CRESTOR) 5 MG tablet Take 5 mg by mouth at bedtime.     . sertraline (ZOLOFT) 100 MG tablet Take 100 mg by mouth at bedtime.     . Aspirin-Acetaminophen-Caffeine (GOODY HEADACHE PO) Take 1 packet by mouth 2 (two) times daily as needed (headaches).    . Colchicine 0.6 MG CAPS Take 0.6 mg by mouth daily.    . furosemide (LASIX) 20 MG tablet Take as needed (Patient taking differently: Take 20 mg by mouth daily as needed for fluid. ) 90 tablet 1  . metoprolol succinate (TOPROL-XL) 25 MG 24 hr tablet Take 6.25-12.5 mg by mouth daily as needed (if systolic bp is over [id number] or if heart is racing).    . pregabalin (LYRICA) 150 MG capsule Take 150 mg by mouth 2 (two) times daily.    . Revefenacin (YUPELRI) 175 MCG/3ML SOLN Inhale 3 mLs into the lungs every evening.    . valsartan (DIOVAN) 40 MG tablet Take 40 mg by mouth as needed. Take if BP is greater than 140     No facility-administered medications prior to visit.    Allergies  Allergen Reactions  . Avelox [Moxifloxacin Hcl In Nacl] Swelling  . Codeine Phosphate Itching  . Cozaar     Unknown   . Cymbalta [Duloxetine Hcl]     Twitching  . Ropinirole      Weakness, dizziness, felt sick  . Statins     myalgia  . Welchol [Colesevelam Hcl]   . Penicillins Rash    Did it involve swelling of the face/tongue/throat, SOB, or low BP? No Did it involve sudden or severe rash/hives, skin peeling, or any reaction on the inside of your mouth or nose? Yes Did you need to seek medical attention at a hospital or doctor's office? Yes When did it last happen?15 + years If all above answers are "NO", may proceed with cephalosporin use.     ROS Review of Systems  Constitutional: Negative for chills and fever.  HENT: Positive for congestion. Negative for ear pain and sore throat.   Respiratory: Negative for cough and shortness of breath.   Cardiovascular: Negative for chest pain.  Gastrointestinal: Negative for abdominal pain, nausea and vomiting.  Genitourinary: Negative for dysuria and urgency.  Musculoskeletal: Positive for arthralgias and back pain.  Neurological: Positive for numbness.  Psychiatric/Behavioral: Negative for dysphoric mood.      Objective:    Physical Exam  Constitutional: He is oriented to person, place, and time. He appears well-developed and well-nourished.  Obese.  Cardiovascular: Normal rate, regular rhythm, normal heart sounds and intact distal pulses.  Pulmonary/Chest: Effort normal and breath sounds normal.  Abdominal: Soft. Bowel sounds are normal. There is no abdominal tenderness.  Musculoskeletal:        General: No tenderness.     Comments: Lumbar spine/paraspinal muscles.   Neurological: He is alert and oriented to person, place, and time.  Skin: Skin is warm.  Psychiatric: He has a normal mood and affect. His behavior is normal.    BP 132/72   Pulse 96   Resp 20   Ht 5\' 10"  (1.778 m)   Wt 278 lb (126.1 kg)   BMI 39.89 kg/m  Wt Readings from Last 3 Encounters:  01/26/20 278 lb (126.1 kg)  11/16/19 284 lb (128.8 kg)  10/20/19 278 lb (126.1 kg)     Health Maintenance Due  Topic Date Due  .  Hepatitis C Screening  Never done  . TETANUS/TDAP  Never done  . COLONOSCOPY  Never done    There are no preventive care reminders to display for this patient.  No results found for: TSH Lab Results  Component Value Date   WBC 12.1 (H) 01/26/2020   HGB 10.4 (L) 01/26/2020   HCT 35.6 (L) 01/26/2020   MCV 75 (L) 01/26/2020   PLT 431 01/26/2020   Lab Results  Component Value Date   NA 143 01/26/2020   K 5.4 (H) 01/26/2020   CO2 28 01/26/2020   GLUCOSE 79 01/26/2020   BUN 14 01/26/2020   CREATININE 0.68 (L) 01/26/2020   BILITOT <0.2 01/26/2020   ALKPHOS 83 01/26/2020   AST 14 01/26/2020   ALT 10 01/26/2020   PROT 6.4 01/26/2020   ALBUMIN 3.9 01/26/2020   CALCIUM 9.1 01/26/2020   Lab Results  Component Value Date   CHOL 129 01/26/2020   Lab Results  Component Value Date   HDL 42 01/26/2020   Lab Results  Component Value Date   LDLCALC 70 01/26/2020   Lab Results  Component Value Date   TRIG 87 01/26/2020   Lab Results  Component Value Date   CHOLHDL 3.1 01/26/2020   No results found for: HGBA1C    Assessment & Plan:  1. Mixed hyperlipidemia Well controlled.  No changes to medicines.  Continue to work on eating a healthy diet and exercise.  Labs drawn today.  - Lipid panel  2. Benign hypertensive heart disease with congestive cardiac failure ([place]) Compensated. Follow with cardiology. - CBC with Differential/Platelet - Comprehensive metabolic panel  3. Acute idiopathic gout of left hand Education given in depth. Started on colchicine 0.6 mg one twice a day for 1 week. - triamcinolone acetonide (KENALOG-40) injection 80 mg  4. Simple chronic bronchitis ([place]) Fair control. Continue current nebulizers/inhalers. Strongly recommend quit smoking.  5. Coronary artery disease of native artery of native heart with stable angina pectoris ([place]) Recommend continue medications. No further syncopal episodes since bp medications were decreased. However his bp is  too high today. I am concerned with his CAD he may need to take the toprol, valsartan and aspirin. I will reach out to his cardiologist.   6. Class 2 severe obesity due to excess calories with serious comorbidity and body mass index (BMI) of 39.0 to 39.9 in adult [facility]) Recommend low fat and dash diet.  7. Cigarette nicotine dependence with nicotine-induced disorder Strongly recommend quit smoking!   Problem List Items Addressed This Visit      Cardiovascular and Mediastinum   Coronary artery disease of native artery of native heart with stable angina pectoris (HCC)   Relevant Medications   pregabalin (LYRICA) 75 MG capsule   Other Relevant Orders   Cardiovascular Risk Assessment (Completed)   RESOLVED: Essential hypertension, benign     Respiratory   COPD (chronic obstructive pulmonary disease) (HCC)     Nervous and Auditory   Cigarette nicotine dependence with nicotine-induced disorder     Musculoskeletal and Integument   Acute idiopathic gout of left hand     Other   Mixed hyperlipidemia - Primary   Relevant Orders   Lipid panel (Completed)   Class 2 severe obesity due to excess calories with serious comorbidity and body mass index (BMI) of 39.0 to 39.9 in adult [facility])  Meds ordered this encounter  Medications  . triamcinolone acetonide (KENALOG-40) injection 80 mg  . colchicine 0.6 MG tablet    Sig: Take 1 tablet (0.6 mg total) by mouth 2 (two) times daily for 7 days.    Dispense:  14 tablet    Refill:  3  . pregabalin (LYRICA) 75 MG capsule    Sig: Take 1 capsule (75 mg total) by mouth 3 (three) times daily.    Dispense:  90 capsule    Refill:  2    Please put in next blister pack.    Follow-up: Return in about 4 weeks (around 02/23/2020) for NOT FASTING.    Rochel Brome, MD

## 2020-01-28 DIAGNOSIS — M5106 Intervertebral disc disorders with myelopathy, lumbar region: Secondary | ICD-10-CM | POA: Diagnosis not present

## 2020-01-28 DIAGNOSIS — J441 Chronic obstructive pulmonary disease with (acute) exacerbation: Secondary | ICD-10-CM | POA: Diagnosis not present

## 2020-01-28 LAB — CBC WITH DIFFERENTIAL/PLATELET
Basophils Absolute: 0.1 10*3/uL (ref 0.0–0.2)
Basos: 1 %
EOS (ABSOLUTE): 0.3 10*3/uL (ref 0.0–0.4)
Eos: 2 %
Hematocrit: 35.6 % — ABNORMAL LOW (ref 37.5–51.0)
Hemoglobin: 10.4 g/dL — ABNORMAL LOW (ref 13.0–17.7)
Immature Grans (Abs): 0 10*3/uL (ref 0.0–0.1)
Immature Granulocytes: 0 %
Lymphocytes Absolute: 1.6 10*3/uL (ref 0.7–3.1)
Lymphs: 14 %
MCH: 21.8 pg — ABNORMAL LOW (ref 26.6–33.0)
MCHC: 29.2 g/dL — ABNORMAL LOW (ref 31.5–35.7)
MCV: 75 fL — ABNORMAL LOW (ref 79–97)
Monocytes Absolute: 0.8 10*3/uL (ref 0.1–0.9)
Monocytes: 6 %
Neutrophils Absolute: 9.2 10*3/uL — ABNORMAL HIGH (ref 1.4–7.0)
Neutrophils: 77 %
Platelets: 431 10*3/uL (ref 150–450)
RBC: 4.77 x10E6/uL (ref 4.14–5.80)
RDW: 18.5 % — ABNORMAL HIGH (ref 11.6–15.4)
WBC: 12.1 10*3/uL — ABNORMAL HIGH (ref 3.4–10.8)

## 2020-01-28 LAB — LIPID PANEL
Chol/HDL Ratio: 3.1 ratio (ref 0.0–5.0)
Cholesterol, Total: 129 mg/dL (ref 100–199)
HDL: 42 mg/dL (ref >39)
LDL Chol Calc (NIH): 70 mg/dL (ref 0–99)
Triglycerides: 87 mg/dL (ref 0–149)
VLDL Cholesterol Cal: 17 mg/dL (ref 5–40)

## 2020-01-28 LAB — COMPREHENSIVE METABOLIC PANEL
ALT: 10 IU/L (ref 0–44)
AST: 14 IU/L (ref 0–40)
Albumin/Globulin Ratio: 1.6 (ref 1.2–2.2)
Albumin: 3.9 g/dL (ref 3.8–4.8)
Alkaline Phosphatase: 83 IU/L (ref 39–117)
BUN/Creatinine Ratio: 21 (ref 10–24)
BUN: 14 mg/dL (ref 8–27)
Bilirubin Total: <0.2 mg/dL (ref 0.0–1.2)
CO2: 28 mmol/L (ref 20–29)
Calcium: 9.1 mg/dL (ref 8.6–10.2)
Chloride: 100 mmol/L (ref 96–106)
Creatinine, Ser: 0.68 mg/dL — ABNORMAL LOW (ref 0.76–1.27)
GFR calc Af Amer: 113 mL/min/{1.73_m2} (ref >59)
GFR calc non Af Amer: 97 mL/min/{1.73_m2} (ref >59)
Globulin, Total: 2.5 g/dL (ref 1.5–4.5)
Glucose: 79 mg/dL (ref 65–99)
Potassium: 5.4 mmol/L — ABNORMAL HIGH (ref 3.5–5.2)
Sodium: 143 mmol/L (ref 134–144)
Total Protein: 6.4 g/dL (ref 6.0–8.5)

## 2020-01-28 LAB — CARDIOVASCULAR RISK ASSESSMENT

## 2020-02-08 ENCOUNTER — Encounter: Payer: Self-pay | Admitting: Family Medicine

## 2020-02-08 DIAGNOSIS — F17219 Nicotine dependence, cigarettes, with unspecified nicotine-induced disorders: Secondary | ICD-10-CM | POA: Insufficient documentation

## 2020-02-08 DIAGNOSIS — M10042 Idiopathic gout, left hand: Secondary | ICD-10-CM | POA: Insufficient documentation

## 2020-02-08 DIAGNOSIS — Z87891 Personal history of nicotine dependence: Secondary | ICD-10-CM | POA: Insufficient documentation

## 2020-02-09 ENCOUNTER — Ambulatory Visit: Payer: Medicare Other | Admitting: Cardiology

## 2020-02-09 ENCOUNTER — Encounter: Payer: Self-pay | Admitting: Cardiology

## 2020-02-09 ENCOUNTER — Other Ambulatory Visit: Payer: Self-pay

## 2020-02-09 VITALS — BP 126/72 | HR 78 | Ht 70.0 in | Wt 276.0 lb

## 2020-02-09 DIAGNOSIS — E782 Mixed hyperlipidemia: Secondary | ICD-10-CM

## 2020-02-09 DIAGNOSIS — I2510 Atherosclerotic heart disease of native coronary artery without angina pectoris: Secondary | ICD-10-CM

## 2020-02-09 DIAGNOSIS — I119 Hypertensive heart disease without heart failure: Secondary | ICD-10-CM | POA: Diagnosis not present

## 2020-02-09 DIAGNOSIS — I519 Heart disease, unspecified: Secondary | ICD-10-CM

## 2020-02-09 NOTE — Progress Notes (Signed)
Cardiology Office Note:    Date:  02/09/2020   ID:  [name], DOB [birth year], MRN [id number]  PCP:  Rochel Brome, MD  Cardiologist:  Berniece Salines, DO  Electrophysiologist:  None   Referring MD: Rochel Brome, MD   The patient is here for a follow up visit.   History of Present Illness:     Shawn Osborn is a 69 y.o. male with a hx of hypertension, hyperlipidemia, tobacco abuse and recurrent syncope episodes, coronary artery disease cath in December 2020 with intermittent stenosis between 65 and 85% mid LAD lesion DFR was negative 0.9 and 0.91 therefore PCI was not performed, Mid PDA lesion 70% stenosis.  I last saw the patient on 11/16/2019 at which time he was post [facility]. At that time I also discuss his monitor results with him which showed PAC and sinus arrhythmia.   At the conclusion of the visit I started the patient on Aspirin 81 mg daily, Crestor 5 mg daily and toprol xl 25 mg daily. He was also kept on low dose valsartan 40mg  daily. His beta blocker and Arb were kept on his regimen in light of his depressed LV EF 40-45% in October. Left ventriculogram reported EF of 50%.   I did hear from the patient's pcp to review his medications as her had been off some of his cardiovascular medication. I requested to see the patient for review his medications.   He is here today and offers no complaints. No syncope episode since his last visit.   Past Medical History:  Diagnosis Date  . Acute combined systolic and diastolic CHF, NYHA class 3 ([place]) 10/08/2019  . Acute delirium 10/08/2019  . Acute exacerbation of CHF (congestive heart failure) ([place]) 10/08/2019  . Acute gout of multiple sites 07/01/2016  . Acute respiratory failure with hypoxia and hypercapnia ([place]) 10/08/2019  . Allergy   . Anxiety   . Arthritis    Gout, osteoarthritis in Back and Knees  . Cataract   . CHF (congestive heart failure) ([place])   . Chronic bronchitis   . Community acquired pneumonia 10/08/2019  .  COPD (chronic obstructive pulmonary disease) ([place]) 10/08/2019  . Dementia ([place]) 10/08/2019  . Elevated troponin 10/08/2019  . Emphysema of lung ([place])   . Essential hypertension 07/03/2016  . GERD (gastroesophageal reflux disease)   . History of kidney stones   . HLD (hyperlipidemia) 10/08/2019  . Hypercapnic respiratory failure ([place]) 10/08/2019  . Hyperlipidemia   . Hypertension   . Hypertensive heart disease 10/08/2019  . Morbid obesity due to excess calories ([place]) 10/08/2019  . Narcotic overdose ([place]) 10/08/2019  . 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 NSTEMI (non-ST elevated myocardial infarction) ([place]) 10/08/2019  . Obstructive sleep apnea   . Occlusion and stenosis of carotid artery without mention of cerebral infarction 12/13/2011  . OSA on CPAP 10/08/2019  . Pain in joint of left shoulder 01/12/2019  . Paranoia (psychosis) ([place]) 07/01/2016  . Poorly-controlled hypertension 10/08/2019  . Prediabetes   . Psychotic disorder with delusions ([place]) 07/01/2016  . Respiratory failure with hypoxia and hypercapnia ([place]) 10/08/2019  . Restless leg syndrome 07/03/2016  . Rheumatoid arthritis(714.0)   . RLS (restless legs syndrome)   . Sepsis ([place]) 10/08/2019  . Sleep apnea   . Spinal stenosis   . Syncope 04/03/2019  . Tear of left rotator cuff 01/12/2019  . Toxic metabolic encephalopathy [id number]  . UTI (urinary tract infection) 10/08/2019  . Vascular  dementia with behavioral disturbance ([place]) 07/06/2016    Past Surgical History:  Procedure Laterality Date  . APPENDECTOMY    . CARPAL TUNNEL RELEASE    . EYE SURGERY    . INTRAVASCULAR PRESSURE WIRE/FFR STUDY N/A 10/20/2019   Procedure: INTRAVASCULAR PRESSURE WIRE/FFR STUDY;  Surgeon: Belva Crome, MD;  Location: [place] [facility];  Service: Cardiovascular;  Laterality: N/A;  . LEFT HEART CATH AND CORONARY ANGIOGRAPHY N/A 10/20/2019   Procedure: LEFT HEART CATH AND CORONARY ANGIOGRAPHY;  Surgeon: Belva Crome, MD;  Location: [place] [facility];  Service: Cardiovascular;  Laterality: N/A;  . [place], 2003, 2012   Laminectomy X 3    Current Medications: Current Meds  Medication Sig  . albuterol (VENTOLIN HFA) 108 (90 Base) MCG/ACT inhaler Inhale 2 puffs into the lungs every 6 (six) hours as needed for wheezing or shortness of breath.  . allopurinol (ZYLOPRIM) 300 MG tablet Take 300 mg by mouth daily.  Marland Kitchen arformoterol (BROVANA) 15 MCG/2ML NEBU Take 2 mLs (15 mcg total) by nebulization 2 (two) times daily.  . Ascorbic Acid (VITAMIN C) 500 MG CAPS Take 500 mg by mouth daily.  Marland Kitchen donepezil (ARICEPT) 5 MG tablet Take 5 mg by mouth at bedtime.  . furosemide (LASIX) 20 MG tablet Take 20 mg by mouth daily as needed.  . Glucosamine-Chondroit-Vit C-Mn (GLUCOSAMINE CHONDR 1500 COMPLX PO) Take by mouth daily.  Marland Kitchen omeprazole (PRILOSEC) 40 MG capsule Take 40 mg by mouth daily.  Marland Kitchen oxyCODONE (ROXICODONE) 15 MG immediate release tablet TAKE ONE TABLET BY MOUTH 4 TIMES DAILY  . Potassium 99 MG TABS Take 297 mg by mouth daily.  . pregabalin (LYRICA) 150 MG capsule Take 150 mg by mouth 2 (two) times daily.  . Psyllium (GERI-MUCIL) 68 % POWD Take by mouth daily.  . Revefenacin (YUPELRI) 175 MCG/3ML SOLN Inhale 3 mLs into the lungs every evening.  . rosuvastatin (CRESTOR) 5 MG tablet Take 5 mg by mouth at bedtime.   . sertraline (ZOLOFT) 100 MG tablet Take 100 mg by mouth at bedtime.      Allergies:   Avelox [moxifloxacin hcl in nacl], Codeine phosphate, Cozaar, Cymbalta [duloxetine hcl], Ropinirole, Statins, Welchol [colesevelam hcl], and Penicillins   Social History   Socioeconomic History  . Marital status: Widowed    Spouse name: Not on file  . Number of children: 4  . Years of education: Not on file  . Highest education level: Not on file  Occupational History  . Not on file  Tobacco Use  . Smoking status: Current Every Day Smoker    Packs/day: 0.50    Years: 53.00    Pack years: 26.50     Last attempt to quit: 11/06/2018    Years since quitting: 1.2  . Smokeless tobacco: Current User    Types: Chew  Substance and Sexual Activity  . Alcohol use: No  . Drug use: No  . Sexual activity: Not on file  Other Topics Concern  . Not on file  Social History Narrative  . Not on file   Social Determinants of Health   Financial Resource Strain:   . Difficulty of Paying Living Expenses:   Food Insecurity:   . Worried About Charity fundraiser in the Last Year:   . Arboriculturist in the Last Year:   Transportation Needs:   . Film/video editor (Medical):   Marland Kitchen Lack of Transportation (Non-Medical):   Physical Activity:   .  Days of Exercise per Week:   . Minutes of Exercise per Session:   Stress:   . Feeling of Stress :   Social Connections:   . Frequency of Communication with Friends and Family:   . Frequency of Social Gatherings with Friends and Family:   . Attends Religious Services:   . Active Member of Clubs or Organizations:   . Attends Archivist Meetings:   Marland Kitchen Marital Status:      Family History: The patient's family history includes Heart disease in his mother; Hypertension in his mother.  ROS:   Review of Systems  Constitution: Negative for decreased appetite, fever and weight gain.  HENT: Negative for congestion, ear discharge, hoarse voice and sore throat.   Eyes: Negative for discharge, redness, vision loss in right eye and visual halos.  Cardiovascular: Negative for chest pain, dyspnea on exertion, leg swelling, orthopnea and palpitations.  Respiratory: Negative for cough, hemoptysis, shortness of breath and snoring.   Endocrine: Negative for heat intolerance and polyphagia.  Hematologic/Lymphatic: Negative for bleeding problem. Does not bruise/bleed easily.  Skin: Negative for flushing, nail changes, rash and suspicious lesions.  Musculoskeletal: Negative for arthritis, joint pain, muscle cramps, myalgias, neck pain and stiffness.    Gastrointestinal: Negative for abdominal pain, bowel incontinence, diarrhea and excessive appetite.  Genitourinary: Negative for decreased libido, genital sores and incomplete emptying.  Neurological: Negative for brief paralysis, focal weakness, headaches and loss of balance.  Psychiatric/Behavioral: Negative for altered mental status, depression and suicidal ideas.  Allergic/Immunologic: Negative for HIV exposure and persistent infections.    EKGs/Labs/Other Studies Reviewed:    The following studies were reviewed today:   EKG:  None today  Echocardiogram on August 24, 2019 reports mild concentric left ventricular hypertrophy. Moderate hypokinesis with apical akinesis. His overall left ventricular systolic function was 40 to 45%. With a pseudonormal LV filling pattern consistent with elevated left atrial pressure. Left atrium was mildly dilated.   LHC  Intermediate stenosis between 7 and 85% in the mid LAD. Physiologic testing with DFR was negative for reduced flow with values of 0.9 and 0.91 (threshold for ischemia is 0.89 and below).  Widely patent left main  Widely patent LAD other than as noted above  Widely patent circumflex coronary artery  Luminal irregularities in a dominant right coronary. Mid PDA contains an eccentric 70% stenosis.  Normal left ventricular wall motion with EF 50%. EDP 22 mmHg.  RECOMMENDATIONS:   Aggressive preventive therapy given the patient's documented coronary artery disease.  Anti-ischemic therapy as clinically indicated.  If develops angina, the LAD could be treated relatively safely.   Event monitor 10/09/2019 The patient wore the event monitor for 20 days with monitoring period between 10/09/2019 - 10/28/2019. Indication: Syncope The minimum heart rate was 57 bpm, maximum heart rate was 108 bpm, and average heart rate was 73 bpm. Predominant underlying rhythm was Sinus Rhythm.   Premature atrial complexes were  rare. No premature ventricular complexes were noted. No pauses, No second or third degree AV block and no atrial fibrillation present.  There were 0 critical, 0 serious, and 2 stable events that occurred.   Stable events noted as below: Manually Detected patient triggered events: 1 Stable: Sinus Rhythm  Automatically Detected Events:1 Stable: Sinus Arrhythmia w/PACs/Artifact  Conclusion: This study is remarkable for sinus arrhythmia with premature atrial contractions.   Recent Labs: 01/26/2020: ALT 10; BUN 14; Creatinine, Ser 0.68; Hemoglobin 10.4; Platelets 431; Potassium 5.4; Sodium 143  Recent Lipid Panel  Component Value Date/Time   CHOL 129 01/26/2020 1208   TRIG 87 01/26/2020 1208   HDL 42 01/26/2020 1208   CHOLHDL 3.1 01/26/2020 1208   LDLCALC 70 01/26/2020 1208    Physical Exam:    VS:  BP 126/72 (BP Location: Left Arm, Patient Position: Sitting, Cuff Size: Large)   Pulse 78   Ht 5\' 10"  (1.778 m)   Wt 276 lb (125.2 kg)   SpO2 94%   BMI 39.60 kg/m     Wt Readings from Last 3 Encounters:  02/09/20 276 lb (125.2 kg)  01/26/20 278 lb (126.1 kg)  11/16/19 284 lb (128.8 kg)     GEN: Well nourished, well developed in no acute distress HEENT: Normal NECK: No JVD; No carotid bruits LYMPHATICS: No lymphadenopathy CARDIAC: S1S2 noted,RRR, no murmurs, rubs, gallops RESPIRATORY:  Clear to auscultation without rales, wheezing or rhonchi  ABDOMEN: Soft, non-tender, non-distended, +bowel sounds, no guarding. EXTREMITIES: No edema, No cyanosis, no clubbing MUSCULOSKELETAL:  No deformity  SKIN: Warm and dry NEUROLOGIC:  Alert and oriented x 3, non-focal PSYCHIATRIC:  Normal affect, good insight  ASSESSMENT:    1. Depressed left ventricular systolic function   2. Coronary artery disease involving native coronary artery of native heart without angina pectoris   3. Hypertensive heart disease without heart failure   4. Mixed  hyperlipidemia    PLAN:    He is doing well from a cardiovascular standpoint. No recurrent syncope episode. He has not been taking any antihypertensive medication. I will keep him on his current regimen.   I will like to repeat a TTE to reassess his LV function  He denies any angina symptoms today.   Blood pressure is acceptable.   The patient is in agreement with the above plan. The patient left the office in stable condition.  The patient will follow up in 6 months or sooner if needed.   Medication Adjustments/Labs and Tests Ordered: Current medicines are reviewed at length with the patient today.  Concerns regarding medicines are outlined above.  Orders Placed This Encounter  Procedures  . ECHOCARDIOGRAM COMPLETE   No orders of the defined types were placed in this encounter.   Patient Instructions  Medication Instructions:  Your physician recommends that you continue on your current medications as directed. Please refer to the Current Medication list given to you today.  *If you need a refill on your cardiac medications before your next appointment, please call your pharmacy*   Lab Work: None ordered   If you have labs (blood work) drawn today and your tests are completely normal, you will receive your results only by: Marland Kitchen MyChart Message (if you have MyChart) OR . A paper copy in the mail If you have any lab test that is abnormal or we need to change your treatment, we will call you to review the results.   Testing/Procedures: Your physician has requested that you have an echocardiogram. Echocardiography is a painless test that uses sound waves to create images of your heart. It provides your doctor with information about the size and shape of your heart and how well your heart's chambers and valves are working. This procedure takes approximately one hour. There are no restrictions for this procedure.   Follow-Up: At [facility], you and your health needs are our  priority.  As part of our continuing mission to provide you with exceptional heart care, we have created designated Provider Care Teams.  These Care Teams include your primary Cardiologist (physician)  and Advanced Practice Providers (APPs -  Physician Assistants and Nurse Practitioners) who all work together to provide you with the care you need, when you need it.  We recommend signing up for the patient portal called "MyChart".  Sign up information is provided on this After Visit Summary.  MyChart is used to connect with patients for Virtual Visits (Telemedicine).  Patients are able to view lab/test results, encounter notes, upcoming appointments, etc.  Non-urgent messages can be sent to your provider as well.   To learn more about what you can do with MyChart, go to [email or web address].    Your next appointment:   6 month(s)  The format for your next appointment:   In Person  Provider:   Berniece Salines, DO   Other Instructions None      Adopting a Healthy Lifestyle.  Know what a healthy weight is for you (roughly BMI <25) and aim to maintain this   Aim for 7+ servings of fruits and vegetables daily   65-80+ fluid ounces of water or unsweet tea for healthy kidneys   Limit to max 1 drink of alcohol per day; avoid smoking/tobacco   Limit animal fats in diet for cholesterol and heart health - choose grass fed whenever available   Avoid highly processed foods, and foods high in saturated/trans fats   Aim for low stress - take time to unwind and care for your mental health   Aim for 150 min of moderate intensity exercise weekly for heart health, and weights twice weekly for bone health   Aim for 7-9 hours of sleep daily   When it comes to diets, agreement about the perfect plan isnt easy to find, even among the experts. Experts at the [employer] developed an idea known as the Healthy Eating Plate. Just imagine a plate divided into logical, healthy  portions.   The emphasis is on diet quality:   Load up on vegetables and fruits - one-half of your plate: Aim for color and variety, and remember that potatoes dont count.   Go for whole grains - one-quarter of your plate: Whole wheat, barley, wheat berries, quinoa, oats, brown rice, and foods made with them. If you want pasta, go with whole wheat pasta.   Protein power - one-quarter of your plate: Fish, chicken, beans, and nuts are all healthy, versatile protein sources. Limit red meat.   The diet, however, does go beyond the plate, offering a few other suggestions.   Use healthy plant oils, such as olive, canola, soy, corn, sunflower and peanut. Check the labels, and avoid partially hydrogenated oil, which have unhealthy trans fats.   If youre thirsty, drink water. Coffee and tea are good in moderation, but skip sugary drinks and limit milk and dairy products to one or two daily servings.   The type of carbohydrate in the diet is more important than the amount. Some sources of carbohydrates, such as vegetables, fruits, whole grains, and beans-are healthier than others.   Finally, stay active  Signed, Berniece Salines, DO  02/09/2020 11:55 AM    [place]

## 2020-02-09 NOTE — Patient Instructions (Signed)
Medication Instructions:  Your physician recommends that you continue on your current medications as directed. Please refer to the Current Medication list given to you today.  *If you need a refill on your cardiac medications before your next appointment, please call your pharmacy*   Lab Work: None ordered   If you have labs (blood work) drawn today and your tests are completely normal, you will receive your results only by: Marland Kitchen MyChart Message (if you have MyChart) OR . A paper copy in the mail If you have any lab test that is abnormal or we need to change your treatment, we will call you to review the results.   Testing/Procedures: Your physician has requested that you have an echocardiogram. Echocardiography is a painless test that uses sound waves to create images of your heart. It provides your doctor with information about the size and shape of your heart and how well your heart's chambers and valves are working. This procedure takes approximately one hour. There are no restrictions for this procedure.   Follow-Up: At [facility], you and your health needs are our priority.  As part of our continuing mission to provide you with exceptional heart care, we have created designated Provider Care Teams.  These Care Teams include your primary Cardiologist (physician) and Advanced Practice Providers (APPs -  Physician Assistants and Nurse Practitioners) who all work together to provide you with the care you need, when you need it.  We recommend signing up for the patient portal called "MyChart".  Sign up information is provided on this After Visit Summary.  MyChart is used to connect with patients for Virtual Visits (Telemedicine).  Patients are able to view lab/test results, encounter notes, upcoming appointments, etc.  Non-urgent messages can be sent to your provider as well.   To learn more about what you can do with MyChart, go to [email or web address].    Your next appointment:   6  month(s)  The format for your next appointment:   In Person  Provider:   Berniece Salines, DO   Other Instructions None

## 2020-02-10 ENCOUNTER — Other Ambulatory Visit: Payer: Self-pay

## 2020-02-10 MED ORDER — YUPELRI 175 MCG/3ML IN SOLN: 175.0000 ug | Freq: Every evening | RESPIRATORY_TRACT | 11 refills | Status: DC

## 2020-02-10 MED ORDER — ARFORMOTEROL TARTRATE 15 MCG/2ML IN NEBU: 15.0000 ug | INHALATION_SOLUTION | Freq: Two times a day (BID) | RESPIRATORY_TRACT | 11 refills | Status: DC

## 2020-02-12 ENCOUNTER — Other Ambulatory Visit: Payer: Self-pay | Admitting: Family Medicine

## 2020-02-25 DIAGNOSIS — J9611 Chronic respiratory failure with hypoxia: Secondary | ICD-10-CM | POA: Diagnosis not present

## 2020-02-25 DIAGNOSIS — J410 Simple chronic bronchitis: Secondary | ICD-10-CM | POA: Diagnosis not present

## 2020-02-25 NOTE — Progress Notes (Signed)
Established Patient Office Visit  Subjective:  Patient ID: Shawn Osborn, male    DOB: Jun 19, 1951  Age: 69 y.o. MRN: [id number]  CC:  Chief Complaint  Patient presents with  . Gout    HPI Shawn Osborn presents for follow up of his gout. He did well on colchicine, but did not call immediately when he flared. He would like to have some on hand, which I agree with.  Patient has on going dementia. His aricept has helped some, but he is very forgetful still.  Past Medical History:  Diagnosis Date  . Acute combined systolic and diastolic CHF, NYHA class 3 ([place]) 10/08/2019  . Acute delirium 10/08/2019  . Acute exacerbation of CHF (congestive heart failure) ([place]) 10/08/2019  . Acute gout of multiple sites 07/01/2016  . Acute respiratory failure with hypoxia and hypercapnia ([place]) 10/08/2019  . Allergy   . Anxiety   . Arthritis    Gout, osteoarthritis in Back and Knees  . Cataract   . CHF (congestive heart failure) ([place])   . Chronic bronchitis   . Community acquired pneumonia 10/08/2019  . COPD (chronic obstructive pulmonary disease) ([place]) 10/08/2019  . Dementia ([place]) 10/08/2019  . Elevated troponin 10/08/2019  . Emphysema of lung ([place])   . Essential hypertension 07/03/2016  . GERD (gastroesophageal reflux disease)   . History of kidney stones   . HLD (hyperlipidemia) 10/08/2019  . Hypercapnic respiratory failure ([place]) 10/08/2019  . Hyperlipidemia   . Hypertension   . Hypertensive heart disease 10/08/2019  . Morbid obesity due to excess calories ([place]) 10/08/2019  . Narcotic overdose ([place]) 10/08/2019  . 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 NSTEMI (non-ST elevated myocardial infarction) ([place]) 10/08/2019  . Obstructive sleep apnea   . Occlusion and stenosis of carotid artery without mention of cerebral infarction 12/13/2011  . OSA on CPAP 10/08/2019  . Pain in joint of left shoulder 01/12/2019  . Paranoia (psychosis) ([place]) 07/01/2016  .  Poorly-controlled hypertension 10/08/2019  . Prediabetes   . Psychotic disorder with delusions ([place]) 07/01/2016  . Respiratory failure with hypoxia and hypercapnia ([place]) 10/08/2019  . Restless leg syndrome 07/03/2016  . Rheumatoid arthritis(714.0)   . RLS (restless legs syndrome)   . Sepsis ([place]) 10/08/2019  . Sleep apnea   . Spinal stenosis   . Syncope 04/03/2019  . Tear of left rotator cuff 01/12/2019  . Toxic metabolic encephalopathy A999333  . UTI (urinary tract infection) 10/08/2019  . Vascular dementia with behavioral disturbance ([place]) 07/06/2016    Past Surgical History:  Procedure Laterality Date  . APPENDECTOMY    . CARPAL TUNNEL RELEASE    . EYE SURGERY    . INTRAVASCULAR PRESSURE WIRE/FFR STUDY N/A 10/20/2019   Procedure: INTRAVASCULAR PRESSURE WIRE/FFR STUDY;  Surgeon: Belva Crome, MD;  Location: [facility];  Service: Cardiovascular;  Laterality: N/A;  . LEFT HEART CATH AND CORONARY ANGIOGRAPHY N/A 10/20/2019   Procedure: LEFT HEART CATH AND CORONARY ANGIOGRAPHY;  Surgeon: Belva Crome, MD;  Location: [facility];  Service: Cardiovascular;  Laterality: N/A;  . [place], 2003, 2012   Laminectomy X 3    Family History  Problem Relation Age of Onset  . Heart disease Mother   . Hypertension Mother     Social History   Socioeconomic History  . Marital status: Widowed    Spouse name: Not on file  . Number of children: 4  . Years  of education: Not on file  . Highest education level: Not on file  Occupational History  . Not on file  Tobacco Use  . Smoking status: Current Every Day Smoker    Packs/day: 0.50    Years: 53.00    Pack years: 26.50    Last attempt to quit: 11/06/2018    Years since quitting: 1.3  . Smokeless tobacco: Current User    Types: Chew  Substance and Sexual Activity  . Alcohol use: No  . Drug use: No  . Sexual activity: Not on file  Other Topics Concern  . Not on file  Social History Narrative  . Not on  file   Social Determinants of Health   Financial Resource Strain:   . Difficulty of Paying Living Expenses:   Food Insecurity:   . Worried About Charity fundraiser in the Last Year:   . Arboriculturist in the Last Year:   Transportation Needs:   . Film/video editor (Medical):   Marland Kitchen Lack of Transportation (Non-Medical):   Physical Activity:   . Days of Exercise per Week:   . Minutes of Exercise per Session:   Stress:   . Feeling of Stress :   Social Connections:   . Frequency of Communication with Friends and Family:   . Frequency of Social Gatherings with Friends and Family:   . Attends Religious Services:   . Active Member of Clubs or Organizations:   . Attends Archivist Meetings:   Marland Kitchen Marital Status:   Intimate Partner Violence:   . Fear of Current or Ex-Partner:   . Emotionally Abused:   Marland Kitchen Physically Abused:   . Sexually Abused:     Outpatient Medications Prior to Visit  Medication Sig Dispense Refill  . albuterol (VENTOLIN HFA) 108 (90 Base) MCG/ACT inhaler Inhale 2 puffs into the lungs every 6 (six) hours as needed for wheezing or shortness of breath. 18 g 5  . allopurinol (ZYLOPRIM) 300 MG tablet TAKE ONE TABLET BY MOUTH EVERY DAY 30 tablet 5  . arformoterol (BROVANA) 15 MCG/2ML NEBU Take 2 mLs (15 mcg total) by nebulization 2 (two) times daily. 120 mL 11  . Ascorbic Acid (VITAMIN C) 500 MG CAPS Take 500 mg by mouth daily.    . furosemide (LASIX) 20 MG tablet Take 20 mg by mouth daily as needed.    . Glucosamine-Chondroit-Vit C-Mn (GLUCOSAMINE CHONDR 1500 COMPLX PO) Take by mouth daily.    Marland Kitchen omeprazole (PRILOSEC) 40 MG capsule Take 40 mg by mouth daily.    Marland Kitchen oxyCODONE (ROXICODONE) 15 MG immediate release tablet TAKE ONE TABLET BY MOUTH 4 TIMES DAILY 120 tablet 0  . Potassium 99 MG TABS Take 297 mg by mouth daily.    . pregabalin (LYRICA) 150 MG capsule Take 150 mg by mouth 2 (two) times daily.    . Psyllium (GERI-MUCIL) 68 % POWD Take by mouth daily.      . rosuvastatin (CRESTOR) 5 MG tablet Take 5 mg by mouth at bedtime.     . sertraline (ZOLOFT) 100 MG tablet Take 100 mg by mouth at bedtime.     Maretta Bees 175 MCG/3ML nebulizer solution Take 3 mLs (175 mcg total) by nebulization every evening. 90 mL 11  . donepezil (ARICEPT) 5 MG tablet Take 10 mg by mouth at bedtime.     No facility-administered medications prior to visit.    Allergies  Allergen Reactions  . Avelox [Moxifloxacin Hcl In Nacl] Swelling  .  Codeine Phosphate Itching  . Cozaar     Unknown   . Cymbalta [Duloxetine Hcl]     Twitching  . Ropinirole     Weakness, dizziness, felt sick  . Statins     myalgia  . Welchol [Colesevelam Hcl]   . Penicillins Rash    Did it involve swelling of the face/tongue/throat, SOB, or low BP? No Did it involve sudden or severe rash/hives, skin peeling, or any reaction on the inside of your mouth or nose? Yes Did you need to seek medical attention at a hospital or doctor's office? Yes When did it last happen?15 + years If all above answers are "NO", may proceed with cephalosporin use.     ROS Review of Systems  Constitutional: Negative for chills, diaphoresis, fatigue and fever.  HENT: Negative for congestion, ear pain and sore throat.   Respiratory: Negative for cough and shortness of breath.   Cardiovascular: Negative for chest pain and leg swelling.  Gastrointestinal: Negative for abdominal pain, constipation, diarrhea, nausea and vomiting.  Genitourinary: Negative for dysuria and urgency.  Musculoskeletal: Positive for arthralgias. Negative for myalgias.       Shoulder is hurting.  Skin:       Left great toe nail is partially torn off. He has no sensation in his legs.   Neurological: Positive for numbness. Negative for dizziness and headaches.       Legs/feet.  Psychiatric/Behavioral: Negative for dysphoric mood.      Objective:    Physical Exam  Constitutional: He appears well-developed and well-nourished.   Cardiovascular: Normal rate and normal heart sounds.  Distant heart sounds.  Pulmonary/Chest: Effort normal and breath sounds normal. No respiratory distress.  Abdominal: Soft. Bowel sounds are normal. There is no abdominal tenderness.  Musculoskeletal:     Comments: Hands are not swollen or hot. No increased erythema.  Neurological: He is alert.  Skin:  Medial side of left great toenail lifted up.   Psychiatric: He has a normal mood and affect. His behavior is normal.    BP (!) 148/76   Pulse 96   Temp (!) 96.9 F (36.1 C)   Resp 18   Ht 5\' 10"  (1.778 m)   Wt 286 lb (129.7 kg)   BMI 41.04 kg/m  Wt Readings from Last 3 Encounters:  03/01/20 286 lb (129.7 kg)  02/26/20 286 lb (129.7 kg)  02/09/20 276 lb (125.2 kg)     Health Maintenance Due  Topic Date Due  . Hepatitis C Screening  Never done  . COVID-19 Vaccine (1) Never done  . TETANUS/TDAP  Never done  . COLONOSCOPY  Never done    There are no preventive care reminders to display for this patient.  No results found for: TSH Lab Results  Component Value Date   WBC 12.1 (H) 01/26/2020   HGB 10.4 (L) 01/26/2020   HCT 35.6 (L) 01/26/2020   MCV 75 (L) 01/26/2020   PLT 431 01/26/2020   Lab Results  Component Value Date   NA 143 01/26/2020   K 5.4 (H) 01/26/2020   CO2 28 01/26/2020   GLUCOSE 79 01/26/2020   BUN 14 01/26/2020   CREATININE 0.68 (L) 01/26/2020   BILITOT <0.2 01/26/2020   ALKPHOS 83 01/26/2020   AST 14 01/26/2020   ALT 10 01/26/2020   PROT 6.4 01/26/2020   ALBUMIN 3.9 01/26/2020   CALCIUM 9.1 01/26/2020   Lab Results  Component Value Date   CHOL 129 01/26/2020   Lab Results  Component Value Date   HDL 42 01/26/2020   Lab Results  Component Value Date   LDLCALC 70 01/26/2020   Lab Results  Component Value Date   TRIG 87 01/26/2020   Lab Results  Component Value Date   CHOLHDL 3.1 01/26/2020   No results found for: HGBA1C    Assessment & Plan:  1. Acute idiopathic gout of  left hand Resolved. Rx: colchicine given for flares.  2. Essential hypertension, benign Fairly well controlled.  No changes to medicines.  Continue to work on eating a healthy diet and exercise.   3. Impingement syndrome of left shoulder Pt to return for injection.  4. Contusion of left great toe with damage to nail, initial encounter Procedure: Nail removal No anesthesia used due to neuropathy.  Toenail lifted and removed using nail nippers.  No complications.  Are cleaned and wrapped.  Remove wrap the next day.  Rx for keflex sent.  5. Alzheimer's dementia - increase aricept to 10 mg once at night.    Meds ordered this encounter  Medications  . cephALEXin (KEFLEX) 500 MG capsule    Sig: Take 1 capsule (500 mg total) by mouth 2 (two) times daily.    Dispense:  14 capsule    Refill:  0  . colchicine 0.6 MG tablet    Sig: Take 1 tablet (0.6 mg total) by mouth daily.    Dispense:  14 tablet    Refill:  3  . donepezil (ARICEPT) 10 MG tablet    Sig: Take 1 tablet (10 mg total) by mouth at bedtime.    Dispense:  30 tablet    Refill:  2    Follow-up: Return in about 3 days (around 02/29/2020) for shoulder injection.Rochel Brome, MD

## 2020-02-26 ENCOUNTER — Ambulatory Visit (INDEPENDENT_AMBULATORY_CARE_PROVIDER_SITE_OTHER): Payer: Medicare Other | Admitting: Family Medicine

## 2020-02-26 ENCOUNTER — Other Ambulatory Visit: Payer: Self-pay

## 2020-02-26 VITALS — BP 148/76 | HR 96 | Temp 96.9°F | Resp 18 | Ht 70.0 in | Wt 286.0 lb

## 2020-02-26 DIAGNOSIS — G308 Other Alzheimer's disease: Secondary | ICD-10-CM

## 2020-02-26 DIAGNOSIS — I10 Essential (primary) hypertension: Secondary | ICD-10-CM

## 2020-02-26 DIAGNOSIS — S90212A Contusion of left great toe with damage to nail, initial encounter: Secondary | ICD-10-CM | POA: Diagnosis not present

## 2020-02-26 DIAGNOSIS — F0280 Dementia in other diseases classified elsewhere without behavioral disturbance: Secondary | ICD-10-CM

## 2020-02-26 DIAGNOSIS — M7542 Impingement syndrome of left shoulder: Secondary | ICD-10-CM

## 2020-02-26 DIAGNOSIS — M10042 Idiopathic gout, left hand: Secondary | ICD-10-CM | POA: Diagnosis not present

## 2020-02-26 MED ORDER — DONEPEZIL HCL 10 MG PO TABS: 10.0000 mg | ORAL_TABLET | Freq: Every day | ORAL | 2 refills | Status: DC

## 2020-02-26 MED ORDER — CEPHALEXIN 500 MG PO CAPS: 500.0000 mg | ORAL_CAPSULE | Freq: Two times a day (BID) | ORAL | 0 refills | Status: DC

## 2020-02-26 MED ORDER — COLCHICINE 0.6 MG PO TABS: 0.6000 mg | ORAL_TABLET | Freq: Every day | ORAL | 3 refills | Status: DC

## 2020-02-26 NOTE — Patient Instructions (Addendum)
Colchicine refill sent.  Increase aricept 10 mg once daily at night. Leave bandage on until tomorrow and then change until tomorrow. Wash with soapy water.  Start on keflex 500 mg one twice a day for 1 week. Return Monday for shoulder injection.

## 2020-02-28 DIAGNOSIS — J441 Chronic obstructive pulmonary disease with (acute) exacerbation: Secondary | ICD-10-CM | POA: Diagnosis not present

## 2020-02-28 DIAGNOSIS — M5106 Intervertebral disc disorders with myelopathy, lumbar region: Secondary | ICD-10-CM | POA: Diagnosis not present

## 2020-02-29 NOTE — Progress Notes (Addendum)
Established Patient Office Visit  Subjective:  Patient ID: Shawn Osborn, male    DOB: [birth year]  Age: 69 y.o. MRN: [id number]  CC:  Chief Complaint  Patient presents with  . Shoulder Pain    Patient requesting a shoulder injection on left side. Pain comes and goes for the past 2 months.    HPI Shawn Osborn presents with left shoulder pain x 3 months. NO injury known.  His range of motion has been affected.  He has tried his pain medicine for this, but it is not helping..  Past Medical History:  Diagnosis Date  . Acute combined systolic and diastolic CHF, NYHA class 3 ([place]) 10/08/2019  . Acute delirium 10/08/2019  . Acute exacerbation of CHF (congestive heart failure) ([place]) 10/08/2019  . Acute gout of multiple sites 07/01/2016  . Acute respiratory failure with hypoxia and hypercapnia ([place]) 10/08/2019  . Allergy   . Anxiety   . Arthritis    Gout, osteoarthritis in Back and Knees  . Cataract   . CHF (congestive heart failure) ([place])   . Chronic bronchitis   . Community acquired pneumonia 10/08/2019  . COPD (chronic obstructive pulmonary disease) ([place]) 10/08/2019  . Dementia ([place]) 10/08/2019  . Elevated troponin 10/08/2019  . Emphysema of lung ([place])   . Essential hypertension 07/03/2016  . GERD (gastroesophageal reflux disease)   . History of kidney stones   . HLD (hyperlipidemia) 10/08/2019  . Hypercapnic respiratory failure ([place]) 10/08/2019  . Hyperlipidemia   . Hypertension   . Hypertensive heart disease 10/08/2019  . Morbid obesity due to excess calories ([place]) 10/08/2019  . Narcotic overdose ([place]) 10/08/2019  . 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 NSTEMI (non-ST elevated myocardial infarction) ([place]) 10/08/2019  . Obstructive sleep apnea   . Occlusion and stenosis of carotid artery without mention of cerebral infarction 12/13/2011  . OSA on CPAP 10/08/2019  . Pain in joint of left shoulder 01/12/2019  . Paranoia (psychosis) ([place])  07/01/2016  . Poorly-controlled hypertension 10/08/2019  . Prediabetes   . Psychotic disorder with delusions ([place]) 07/01/2016  . Respiratory failure with hypoxia and hypercapnia ([place]) 10/08/2019  . Restless leg syndrome 07/03/2016  . Rheumatoid arthritis(714.0)   . RLS (restless legs syndrome)   . Sepsis ([place]) 10/08/2019  . Sleep apnea   . Spinal stenosis   . Syncope 04/03/2019  . Tear of left rotator cuff 01/12/2019  . Toxic metabolic encephalopathy A999333  . UTI (urinary tract infection) 10/08/2019  . Vascular dementia with behavioral disturbance ([place]) 07/06/2016    Past Surgical History:  Procedure Laterality Date  . APPENDECTOMY    . CARPAL TUNNEL RELEASE    . EYE SURGERY    . INTRAVASCULAR PRESSURE WIRE/FFR STUDY N/A 10/20/2019   Procedure: INTRAVASCULAR PRESSURE WIRE/FFR STUDY;  Surgeon: Belva Crome, MD;  Location: [facility];  Service: Cardiovascular;  Laterality: N/A;  . LEFT HEART CATH AND CORONARY ANGIOGRAPHY N/A 10/20/2019   Procedure: LEFT HEART CATH AND CORONARY ANGIOGRAPHY;  Surgeon: Belva Crome, MD;  Location: [facility];  Service: Cardiovascular;  Laterality: N/A;  . [place], 2003, 2012   Laminectomy X 3    Family History  Problem Relation Age of Onset  . Heart disease Mother   . Hypertension Mother     Social History   Socioeconomic History  . Marital status: Widowed    Spouse name: Not on file  . Number of  children: 4  . Years of education: Not on file  . Highest education level: Not on file  Occupational History  . Not on file  Tobacco Use  . Smoking status: Current Every Day Smoker    Packs/day: 0.50    Years: 53.00    Pack years: 26.50    Last attempt to quit: 11/06/2018    Years since quitting: 1.3  . Smokeless tobacco: Current User    Types: Chew  Substance and Sexual Activity  . Alcohol use: No  . Drug use: No  . Sexual activity: Not on file  Other Topics Concern  . Not on file  Social History  Narrative  . Not on file   Social Determinants of Health   Financial Resource Strain:   . Difficulty of Paying Living Expenses:   Food Insecurity:   . Worried About Charity fundraiser in the Last Year:   . Arboriculturist in the Last Year:   Transportation Needs:   . Film/video editor (Medical):   Marland Kitchen Lack of Transportation (Non-Medical):   Physical Activity:   . Days of Exercise per Week:   . Minutes of Exercise per Session:   Stress:   . Feeling of Stress :   Social Connections:   . Frequency of Communication with Friends and Family:   . Frequency of Social Gatherings with Friends and Family:   . Attends Religious Services:   . Active Member of Clubs or Organizations:   . Attends Archivist Meetings:   Marland Kitchen Marital Status:   Intimate Partner Violence:   . Fear of Current or Ex-Partner:   . Emotionally Abused:   Marland Kitchen Physically Abused:   . Sexually Abused:     Outpatient Medications Prior to Visit  Medication Sig Dispense Refill  . albuterol (VENTOLIN HFA) 108 (90 Base) MCG/ACT inhaler Inhale 2 puffs into the lungs every 6 (six) hours as needed for wheezing or shortness of breath. 18 g 5  . allopurinol (ZYLOPRIM) 300 MG tablet TAKE ONE TABLET BY MOUTH EVERY DAY 30 tablet 5  . arformoterol (BROVANA) 15 MCG/2ML NEBU Take 2 mLs (15 mcg total) by nebulization 2 (two) times daily. 120 mL 11  . Ascorbic Acid (VITAMIN C) 500 MG CAPS Take 500 mg by mouth daily.    . cephALEXin (KEFLEX) 500 MG capsule Take 1 capsule (500 mg total) by mouth 2 (two) times daily. 14 capsule 0  . colchicine 0.6 MG tablet Take 1 tablet (0.6 mg total) by mouth daily. 14 tablet 3  . donepezil (ARICEPT) 10 MG tablet Take 1 tablet (10 mg total) by mouth at bedtime. 30 tablet 2  . furosemide (LASIX) 20 MG tablet Take 20 mg by mouth daily as needed.    . Glucosamine-Chondroit-Vit C-Mn (GLUCOSAMINE CHONDR 1500 COMPLX PO) Take by mouth daily.    Marland Kitchen omeprazole (PRILOSEC) 40 MG capsule Take 40 mg by mouth  daily.    . Potassium 99 MG TABS Take 297 mg by mouth daily.    . pregabalin (LYRICA) 150 MG capsule Take 150 mg by mouth 2 (two) times daily.    . Psyllium (GERI-MUCIL) 68 % POWD Take by mouth daily.    Maretta Bees 175 MCG/3ML nebulizer solution Take 3 mLs (175 mcg total) by nebulization every evening. 90 mL 11  . oxyCODONE (ROXICODONE) 15 MG immediate release tablet TAKE ONE TABLET BY MOUTH 4 TIMES DAILY 120 tablet 0  . rosuvastatin (CRESTOR) 5 MG tablet Take 5 mg  by mouth at bedtime.     . sertraline (ZOLOFT) 100 MG tablet Take 100 mg by mouth at bedtime.      No facility-administered medications prior to visit.    Allergies  Allergen Reactions  . Avelox [Moxifloxacin Hcl In Nacl] Swelling  . Codeine Phosphate Itching  . Cozaar     Unknown   . Cymbalta [Duloxetine Hcl]     Twitching  . Ropinirole     Weakness, dizziness, felt sick  . Statins     myalgia  . Welchol [Colesevelam Hcl]   . Penicillins Rash    Did it involve swelling of the face/tongue/throat, SOB, or low BP? No Did it involve sudden or severe rash/hives, skin peeling, or any reaction on the inside of your mouth or nose? Yes Did you need to seek medical attention at a hospital or doctor's office? Yes When did it last happen?15 + years If all above answers are "NO", may proceed with cephalosporin use.     ROS Review of Systems  Constitutional: Negative for chills, diaphoresis, fatigue and fever.  HENT: Negative for congestion, ear pain and sore throat.   Respiratory: Positive for cough. Negative for shortness of breath.   Cardiovascular: Negative for chest pain and leg swelling.  Gastrointestinal: Negative for abdominal pain, constipation, diarrhea, nausea and vomiting.  Genitourinary: Negative for dysuria and urgency.  Musculoskeletal: Positive for arthralgias (Left shoulder). Negative for myalgias.  Neurological: Negative for dizziness and headaches.  Psychiatric/Behavioral: Negative for dysphoric mood.  The patient is not nervous/anxious.       Objective:    Physical Exam  Constitutional: He appears well-developed and well-nourished.  Cardiovascular: Normal rate, regular rhythm and normal heart sounds.  Pulmonary/Chest: Effort normal and breath sounds normal.  Musculoskeletal:        General: Tenderness (Left anterior shoulder.  Abduction is limited to around 60 degrees.  He has poor external and internal rotation due to pain.  Positive empty can sign.) present.  Neurological: He is alert.  Psychiatric: He has a normal mood and affect. His behavior is normal.    BP (!) 152/88 (BP Location: Left Arm, Patient Position: Sitting)   Pulse 98   Temp (!) 97 F (36.1 C) (Temporal)   Ht 5\' 11"  (1.803 m)   Wt 286 lb (129.7 kg)   SpO2 95%   BMI 39.89 kg/m  Wt Readings from Last 3 Encounters:  03/01/20 286 lb (129.7 kg)  02/26/20 286 lb (129.7 kg)  02/09/20 276 lb (125.2 kg)     Health Maintenance Due  Topic Date Due  . Hepatitis C Screening  Never done  . COVID-19 Vaccine (1) Never done  . TETANUS/TDAP  Never done  . COLONOSCOPY  Never done    There are no preventive care reminders to display for this patient.  No results found for: TSH Lab Results  Component Value Date   WBC 12.1 (H) 01/26/2020   HGB 10.4 (L) 01/26/2020   HCT 35.6 (L) 01/26/2020   MCV 75 (L) 01/26/2020   PLT 431 01/26/2020   Lab Results  Component Value Date   NA 143 01/26/2020   K 5.4 (H) 01/26/2020   CO2 28 01/26/2020   GLUCOSE 79 01/26/2020   BUN 14 01/26/2020   CREATININE 0.68 (L) 01/26/2020   BILITOT <0.2 01/26/2020   ALKPHOS 83 01/26/2020   AST 14 01/26/2020   ALT 10 01/26/2020   PROT 6.4 01/26/2020   ALBUMIN 3.9 01/26/2020   CALCIUM 9.1 01/26/2020  Lab Results  Component Value Date   CHOL 129 01/26/2020   Lab Results  Component Value Date   HDL 42 01/26/2020   Lab Results  Component Value Date   LDLCALC 70 01/26/2020   Lab Results  Component Value Date   TRIG 87  01/26/2020   Lab Results  Component Value Date   CHOLHDL 3.1 01/26/2020   No results found for: HGBA1C    Assessment & Plan:  1. Impingement syndrome of left shoulder Risks were discussed including bleeding, infection, increase in sugars if diabetic, atrophy at site of injection, and increased pain.  After consent was obtained, using sterile technique the left posterior shoulder was prepped with betadine and alcohol.  The joint was entered posteriorly and injected with Kenalog 40 mg and 5 ml plain Lidocaine. The procedure was well tolerated.  There were no complications.  Call or return to clinic prn if any issues occur or there is failure to improve as anticipated.  - triamcinolone acetonide (KENALOG-40) injection 40 mg  Meds ordered this encounter  Medications  . triamcinolone acetonide (KENALOG-40) injection 40 mg  . hydrOXYzine (ATARAX/VISTARIL) 50 MG tablet    Sig: Take 1 tablet (50 mg total) by mouth at bedtime.    Dispense:  30 tablet    Refill:  2    Follow-up: Return in about 2 months (around 05/02/2020) for fasting visit.   ADDED 91478 Large joint injection. Add 25 modifier if not already on claim.  Rochel Brome, MD

## 2020-03-01 ENCOUNTER — Ambulatory Visit (INDEPENDENT_AMBULATORY_CARE_PROVIDER_SITE_OTHER): Payer: Medicare Other | Admitting: Family Medicine

## 2020-03-01 ENCOUNTER — Other Ambulatory Visit: Payer: Self-pay

## 2020-03-01 ENCOUNTER — Encounter: Payer: Self-pay | Admitting: Family Medicine

## 2020-03-01 VITALS — BP 152/88 | HR 98 | Temp 97.0°F | Ht 71.0 in | Wt 286.0 lb

## 2020-03-01 DIAGNOSIS — M7542 Impingement syndrome of left shoulder: Secondary | ICD-10-CM | POA: Insufficient documentation

## 2020-03-01 MED ORDER — TRIAMCINOLONE ACETONIDE 40 MG/ML IJ SUSP: 40.0000 mg | Freq: Once | INTRAMUSCULAR | Status: DC

## 2020-03-01 MED ORDER — HYDROXYZINE HCL 50 MG PO TABS: 50.0000 mg | ORAL_TABLET | Freq: Every day | ORAL | 2 refills | Status: DC

## 2020-03-03 ENCOUNTER — Ambulatory Visit: Payer: Medicare Other | Admitting: Family Medicine

## 2020-03-03 ENCOUNTER — Telehealth: Payer: Self-pay | Admitting: Family Medicine

## 2020-03-03 NOTE — Progress Notes (Signed)
  Chronic Care Management   Note  03/03/2020 Name: HAMMOND BURNINGHAM MRN: [id number] DOB: 1951/06/08  KENDRYCK GALANTE is a 69 y.o. year old male who is a primary care patient of Cox, Kirsten, MD. I reached out to [name] by phone today in response to a referral sent by Mr. Erich Montane Spivey's PCP, Cox, Kirsten, MD.   Mr. Bendik was given information about Chronic Care Management services today including:  1. CCM service includes personalized support from designated clinical staff supervised by his physician, including individualized plan of care and coordination with other care providers 2. 24/7 contact phone numbers for assistance for urgent and routine care needs. 3. Service will only be billed when office clinical staff spend 20 minutes or more in a month to coordinate care. 4. Only one practitioner may furnish and bill the service in a calendar month. 5. The patient may stop CCM services at any time (effective at the end of the month) by phone call to the office staff.   Patient agreed to services and verbal consent obtained.   Follow up plan:   Earney [place] [facility] Scheduler

## 2020-03-07 ENCOUNTER — Encounter: Payer: Self-pay | Admitting: Family Medicine

## 2020-03-07 DIAGNOSIS — S90212A Contusion of left great toe with damage to nail, initial encounter: Secondary | ICD-10-CM | POA: Insufficient documentation

## 2020-03-11 ENCOUNTER — Other Ambulatory Visit: Payer: Self-pay | Admitting: Family Medicine

## 2020-03-13 ENCOUNTER — Encounter: Payer: Self-pay | Admitting: Family Medicine

## 2020-03-16 ENCOUNTER — Other Ambulatory Visit: Payer: Self-pay

## 2020-03-16 ENCOUNTER — Ambulatory Visit (INDEPENDENT_AMBULATORY_CARE_PROVIDER_SITE_OTHER): Payer: Medicare Other

## 2020-03-16 DIAGNOSIS — I119 Hypertensive heart disease without heart failure: Secondary | ICD-10-CM

## 2020-03-16 DIAGNOSIS — I519 Heart disease, unspecified: Secondary | ICD-10-CM

## 2020-03-16 MED ORDER — PERFLUTREN LIPID MICROSPHERE: 1.0000 mL | INTRAVENOUS | 0 refills | Status: DC | PRN

## 2020-03-16 NOTE — Progress Notes (Unsigned)
Complete echocardiogram with contrast performed.  Jimmy Milburn Freeney RDCS, RVT  

## 2020-03-17 ENCOUNTER — Telehealth: Payer: Self-pay

## 2020-03-17 NOTE — Telephone Encounter (Signed)
-----   Message from Berniece Salines, DO sent at 03/16/2020  9:32 PM EDT ----- Your ejection fraction is mildly depressed, the echo also shows that the heart is not fully relaxing like it should ( diastolic dysfunction) ,but otherwise normal. I will discuss it at the next office visit.

## 2020-03-17 NOTE — Telephone Encounter (Signed)
Left message on patients voicemail to please return our call.   

## 2020-03-21 NOTE — Telephone Encounter (Signed)
Spoke with patient regarding results and recommendation.  Patient verbalizes understanding and is agreeable to plan of care. Advised patient to call back with any issues or concerns.  

## 2020-03-21 NOTE — Chronic Care Management (AMB) (Signed)
Chronic Care Management Pharmacy  Name: Shawn Osborn  MRN: [id number] DOB: September 20, 1951  Chief Complaint/ HPI  Shawn Osborn,  69 y.o. , male presents for their Initial CCM visit with the clinical pharmacist via telephone due to COVID-19 Pandemic.  PCP : Rochel Brome, MD  Their chronic conditions include: HTN, NSTEMI, CAD, COPD, Alzheimer's disease, Vascular dementia, Gout, HLD, RLS.  Office Visits: 03/01/2020 - Shoulder injection for pain. Hydroxyzine 50 mg qhs for sleep. 02/26/2020 -Increase aricept to 10 mg qpm. Keflex prescribed since toenail removed in office. Gave prn colchicine to have on hand for gout flares.  01/26/2020 - s/t colchicine for gout flare, pregabalin 75 mg tid,  11/18/2019 - AWV with nurse.  10/26/2019 - a1c 5.3%. Anemia stable.  10/08/2019 - Labs stable.  10/06/2019 - increased valsartan to bid. Patient complaint of dizziness.  10/02/2019 - Patient discharged from [facility] due to recurrent syncope.  Consult Visit: 03/17/2020 - Cardiologist results from echo showed heart is not relaxing as much as it should 02/09/2020 - no med changes. BP well controlled. 11/16/2019 - Goal cholesterol ldl <70. Patient managed at Crestor 5 but consider alternative or increased dose if not at goal of <70. Patient has CAD. BP meds ok for now monitor for additional syncope or hypotension.  10/20/2019 - Ordered catherization, advised weight loss and 30 day heart monitor.  Medications: Outpatient Encounter Medications as of 03/23/2020  Medication Sig  . allopurinol (ZYLOPRIM) 300 MG tablet TAKE ONE TABLET BY MOUTH EVERY DAY  . arformoterol (BROVANA) 15 MCG/2ML NEBU Take 2 mLs (15 mcg total) by nebulization 2 (two) times daily.  . Ascorbic Acid (VITAMIN C) 500 MG CAPS Take 500 mg by mouth daily.  . colchicine 0.6 MG tablet Take 1 tablet (0.6 mg total) by mouth daily. (Patient taking differently: Take 0.6 mg by mouth daily as needed. )  . docusate sodium (COLACE) 100 MG capsule Take  100 mg by mouth daily as needed for mild constipation.  Marland Kitchen donepezil (ARICEPT) 10 MG tablet Take 1 tablet (10 mg total) by mouth at bedtime.  . furosemide (LASIX) 20 MG tablet Take 20 mg by mouth daily as needed.  . hydrOXYzine (ATARAX/VISTARIL) 50 MG tablet Take 1 tablet (50 mg total) by mouth at bedtime.  Marland Kitchen omeprazole (PRILOSEC) 40 MG capsule Take 40 mg by mouth daily.  Marland Kitchen oxyCODONE (ROXICODONE) 15 MG immediate release tablet TAKE ONE TABLET BY MOUTH 4 TIMES DAILY  . Potassium 99 MG TABS Take 297 mg by mouth daily as needed.   . pregabalin (LYRICA) 75 MG capsule Take 75 mg by mouth in the morning, at noon, and at bedtime.   . rosuvastatin (CRESTOR) 5 MG tablet TAKE ONE TABLET BY MOUTH AT BEDTIME  . sertraline (ZOLOFT) 100 MG tablet TAKE ONE TABLET BY MOUTH AT BEDTIME  . [DISCONTINUED] albuterol (VENTOLIN HFA) 108 (90 Base) MCG/ACT inhaler Inhale 2 puffs into the lungs every 6 (six) hours as needed for wheezing or shortness of breath.  . cephALEXin (KEFLEX) 500 MG capsule Take 1 capsule (500 mg total) by mouth 2 (two) times daily. (Patient not taking: Reported on 03/23/2020)  . Glucosamine-Chondroit-Vit C-Mn (GLUCOSAMINE CHONDR 1500 COMPLX PO) Take by mouth daily.  Marland Kitchen PERFLUTREN LIPID MICROSPHERE Inject 1-10 mLs into the vein as needed. (Patient not taking: Reported on 03/23/2020)  . Psyllium (GERI-MUCIL) 68 % POWD Take by mouth daily.  Maretta Bees 175 MCG/3ML nebulizer solution Take 3 mLs (175 mcg total) by nebulization every evening. (Patient not taking:  Reported on 03/23/2020)   Facility-Administered Encounter Medications as of 03/23/2020  Medication  . triamcinolone acetonide (KENALOG-40) injection 40 mg   Allergies  Allergen Reactions  . Avelox [Moxifloxacin Hcl In Nacl] Swelling  . Codeine Phosphate Itching  . Cozaar     Unknown   . Cymbalta [Duloxetine Hcl]     Twitching  . Ropinirole     Weakness, dizziness, felt sick  . Statins     myalgia  . Welchol [Colesevelam Hcl]   . Penicillins  Rash    Did it involve swelling of the face/tongue/throat, SOB, or low BP? No Did it involve sudden or severe rash/hives, skin peeling, or any reaction on the inside of your mouth or nose? Yes Did you need to seek medical attention at a hospital or doctor's office? Yes When did it last happen?15 + years If all above answers are "NO", may proceed with cephalosporin use.    SDOH Screenings   Alcohol Screen:   . Last Alcohol Screening Score (AUDIT):   Depression (PHQ2-9): Low Risk   . PHQ-2 Score: 0  Financial Resource Strain:   . Difficulty of Paying Living Expenses:   Food Insecurity: No Food Insecurity  . Worried About Charity fundraiser in the Last Year: Never true  . Ran Out of Food in the Last Year: Never true  Housing:   . Last Housing Risk Score:   Physical Activity:   . Days of Exercise per Week:   . Minutes of Exercise per Session:   Social Connections:   . Frequency of Communication with Friends and Family:   . Frequency of Social Gatherings with Friends and Family:   . Attends Religious Services:   . Active Member of Clubs or Organizations:   . Attends Archivist Meetings:   Marland Kitchen Marital Status:   Stress:   . Feeling of Stress :   Tobacco Use: High Risk  . Smoking Tobacco Use: Current Every Day Smoker  . Smokeless Tobacco Use: Current User  Transportation Needs:   . Lack of Transportation (Medical):   Marland Kitchen Lack of Transportation (Non-Medical):      Current Diagnosis/Assessment:  Goals Addressed            This Visit's Progress   . Pharmacy Care Plan - COPD Medication Assistance       CARE PLAN ENTRY (see longitudinal plan of care for additional care plan information)  Current Barriers:  . Financial Barriers: patient has [employer] and reports copay for [employer] is cost prohibitive at this time. His portion of the cost is >$300/month.  Pharmacist Clinical Goal(s):  Marland Kitchen Over the next 30 days, patient will work with PharmD and  providers to relieve medication access concerns  Interventions: . Comprehensive medication review completed; medication list updated in electronic medical record.  Bertram Savin care team collaboration (see longitudinal plan of care)   Patient Self Care Activities:  . Patient will provide necessary portions of application   Initial goal documentation     . Pharmacy Care Plan - HLD       CARE PLAN ENTRY (see longitudinal plan of care for additional care plan information)  Current Barriers:  . Chronic Disease Management support, education and care coordination needs related to hyperlipidemia.  . Current antihyperlipidemic regimen:rosuvastatin 5 mg  . Previous antihyperlipidemic medications tried n/a . Most recent lipid panel:     Component Value Date/Time   CHOL 129 01/26/2020 1208   TRIG 87 01/26/2020 1208  HDL 42 01/26/2020 1208   CHOLHDL 3.1 01/26/2020 1208   LDLCALC 70 01/26/2020 1208 .   Marland Kitchen ASCVD risk enhancing conditions: age >51, DM, HTN, CKD, CHF, current smoker  Pharmacist Clinical Goal(s):  Marland Kitchen Over the next *90days, patient will work with PharmD to continue reducing his risk of stroke or heart attack by controlling his blood pressure and cholesterol  Interventions: . Comprehensive medication review performed; medication list updated in electronic medical record.  Bertram Savin care team collaboration (see longitudinal plan of care) . Disucssed importance of lowering risk by controlling blood pressure and taking statin  Patient Self Care Activities:  . Patient will focus on medication adherence by continuing to take medications in blisterpacking from local pharmacy . Patient verbalized understanding of plan to follow as described above, self administering medication as prescribed, callspharmacy for medication refills and calls provider office for new concerns or questions  Initial goal documentation        COPD / Asthma / Tobacco    Eosinophil  count:  No results found for: EOSPCT%                               Eos (Absolute):  Lab Results  Component Value Date/Time   EOSABS 0.3 01/26/2020 12:08 PM    Tobacco Status:  Social History   Tobacco Use  Smoking Status Current Every Day Smoker  . Packs/day: 0.50  . Years: 53.00  . Pack years: 26.50  . Last attempt to quit: 11/06/2018  . Years since quitting: 1.3  Smokeless Tobacco Current User  . Types: Chew    Patient has failed these meds in past: Symbicort, duoneb, performist, nasonex Patient is currently controlled on the following medications: Yupelri, albuterol and Brovana Using maintenance inhaler regularly? Yes Frequency of rescue inhaler use:  1-2x per week  We discussed:  Patient is having difficulty affording the [place]. Pharmacist is following up with the mail order pharmacy supplying this medication.   Plan  Continue current medications ,  Hypertension   BP today is:  <140/90  Office blood pressures are  BP Readings from Last 3 Encounters:  03/01/20 (!) 152/88  02/26/20 (!) 148/76  02/09/20 126/72    Patient has failed these meds in the past:atenolol, clonidine, isosorbide, lisinopril, metoprolol, valsartan, spironolactone Patient is currently controlledon the following medications: furosemide prn  Patient checks BP at home daily  Patient home BP readings are ranging: 130/80 mmHg  We discussed diet and exercise extensively. Patient has lost some weight. Was 325 lbs and last week weighed 278 lbs. Has cut back on salt. Has cut back on drinks. Also cut back eating pork and organ meat.   Plan  Continue current medications   Hyperlipidemia   Lipid Panel     Component Value Date/Time   CHOL 129 01/26/2020 1208   TRIG 87 01/26/2020 1208   HDL 42 01/26/2020 1208   CHOLHDL 3.1 01/26/2020 1208   LDLCALC 70 01/26/2020 1208   LABVLDL 17 01/26/2020 1208     The ASCVD Risk score (Goff DC Jr., et al., 2013) failed to calculate for the  following reasons:   The patient has a prior MI or stroke diagnosis   Patient has failed these meds in past: n/a Patient is currently controlled on the following medications: Rosuvastatin  We discussed:  diet and exercise extensively. Patient has done a great job with weight loss and diet since a hospitalization a  few months ago. He indicates he rarely drinks soft drinks any more and watches what he eats. He does some of his therapy exercises at home for mobility and stays active outdoors.   Plan  Continue current medications     and  Gout    Patient has failed these meds in past: n/a Patient is currently controlled on the following medications: allopurinol 300 mg daily, colchicine 0.6 mg daily prn  We discussed:  Patient reports if he avoids drinks, liver and pork, he has very few issues with gout. Patient has colchicine at home if needed for future flares.   Plan  Continue current medications   Alzheimer's Disease   Patient has failed these meds in past: n/a Patient is currently controlled on the following medications: donepezil 10 mg daily  We discussed:  Patient is pleased with current regimen.   Plan  Continue current medications   Health Maintenance   Patient is currently controlled on the following medications:  Potassium OTC - cramps Stool softener prn - constipation Sertraline 100 mg - depression  We discussed:  Patient reports overall health improved with weight loss.   Plan  Continue current medications  Vaccines   Reviewed and discussed patient's vaccination history.  Has had both COVID vaccines.   Immunization History  Administered Date(s) Administered  . Influenza, Seasonal, Injecte, Preservative Fre 08/19/2018  . Influenza-Unspecified 08/19/2018  . Pneumococcal Conjugate-13 08/19/2017  . Pneumococcal Polysaccharide-23 08/20/2015    Plan  Recommended patient receive annual flu vaccine in office.   Medication Management   [facility] medications Uses pill box? Yes Pt endorses good compliance  We discussed: Patient is receiving blisterpacking through his current pharmacy. He is pleased with the service and helps him take his medications appropriately.   Plan  Continue current medication management strategy    Follow up: 2 month phone visit

## 2020-03-23 ENCOUNTER — Ambulatory Visit: Payer: Medicare Other

## 2020-03-23 ENCOUNTER — Other Ambulatory Visit: Payer: Self-pay

## 2020-03-23 DIAGNOSIS — J449 Chronic obstructive pulmonary disease, unspecified: Secondary | ICD-10-CM

## 2020-03-23 DIAGNOSIS — E782 Mixed hyperlipidemia: Secondary | ICD-10-CM

## 2020-03-24 ENCOUNTER — Other Ambulatory Visit: Payer: Self-pay

## 2020-03-24 MED ORDER — ALBUTEROL SULFATE HFA 108 (90 BASE) MCG/ACT IN AERS: 2.0000 | INHALATION_SPRAY | Freq: Four times a day (QID) | RESPIRATORY_TRACT | 5 refills | Status: DC | PRN

## 2020-03-26 DIAGNOSIS — J9611 Chronic respiratory failure with hypoxia: Secondary | ICD-10-CM | POA: Diagnosis not present

## 2020-03-26 DIAGNOSIS — J410 Simple chronic bronchitis: Secondary | ICD-10-CM | POA: Diagnosis not present

## 2020-03-26 NOTE — Patient Instructions (Signed)
Visit Information  Thank you for your time discussing your medications. I look forward to working with you to achieve your health care goals. Below is a summary of what we talked about during our visit.   Goals Addressed            This Visit's Progress   . Pharmacy Care Plan - COPD Medication Assistance       CARE PLAN ENTRY (see longitudinal plan of care for additional care plan information)  Current Barriers:  . Financial Barriers: patient has [employer] and reports copay for [employer] is cost prohibitive at this time. His portion of the cost is >$300/month.  Pharmacist Clinical Goal(s):  Marland Kitchen Over the next 30 days, patient will work with PharmD and providers to relieve medication access concerns  Interventions: . Comprehensive medication review completed; medication list updated in electronic medical record.  Shawn Osborn care team collaboration (see longitudinal plan of care)   Patient Self Care Activities:  . Patient will provide necessary portions of application   Initial goal documentation     . Pharmacy Care Plan - HLD       CARE PLAN ENTRY (see longitudinal plan of care for additional care plan information)  Current Barriers:  . Chronic Disease Management support, education and care coordination needs related to hyperlipidemia.  . Current antihyperlipidemic regimen:rosuvastatin 5 mg  . Previous antihyperlipidemic medications tried n/a . Most recent lipid panel:     Component Value Date/Time   CHOL 129 01/26/2020 1208   TRIG 87 01/26/2020 1208   HDL 42 01/26/2020 1208   CHOLHDL 3.1 01/26/2020 1208   LDLCALC 70 01/26/2020 1208 .   Marland Kitchen ASCVD risk enhancing conditions: age >42, DM, HTN, CKD, CHF, current smoker  Pharmacist Clinical Goal(s):  Marland Kitchen Over the next *90days, patient will work with PharmD to continue reducing his risk of stroke or heart attack by controlling his blood pressure and cholesterol  Interventions: . Comprehensive medication  review performed; medication list updated in electronic medical record.  Shawn Osborn care team collaboration (see longitudinal plan of care) . Disucssed importance of lowering risk by controlling blood pressure and taking statin  Patient Self Care Activities:  . Patient will focus on medication adherence by continuing to take medications in blisterpacking from local pharmacy . Patient verbalized understanding of plan to follow as described above, self administering medication as prescribed, callspharmacy for medication refills and calls provider office for new concerns or questions  Initial goal documentation        Shawn Osborn was given information about Chronic Care Management services today including:  1. CCM service includes personalized support from designated clinical staff supervised by his physician, including individualized plan of care and coordination with other care providers 2. 24/7 contact phone numbers for assistance for urgent and routine care needs. 3. Standard insurance, coinsurance, copays and deductibles apply for chronic care management only during months in which we provide at least 20 minutes of these services. Most insurances cover these services at 100%, however patients may be responsible for any copay, coinsurance and/or deductible if applicable. This service may help you avoid the need for more expensive face-to-face services. 4. Only one practitioner may furnish and bill the service in a calendar month. 5. The patient may stop CCM services at any time (effective at the end of the month) by phone call to the office staff.  Patient agreed to services and verbal consent obtained.   The patient verbalized understanding of instructions provided  today and agreed to receive a mailed copy of patient instruction and/or educational materials. Telephone follow up appointment with pharmacy team member scheduled for:   Shawn Osborn, PharmD Clinical  Pharmacist [facility] [phone]

## 2020-03-29 DIAGNOSIS — M5106 Intervertebral disc disorders with myelopathy, lumbar region: Secondary | ICD-10-CM | POA: Diagnosis not present

## 2020-03-29 DIAGNOSIS — J441 Chronic obstructive pulmonary disease with (acute) exacerbation: Secondary | ICD-10-CM | POA: Diagnosis not present

## 2020-04-07 ENCOUNTER — Other Ambulatory Visit: Payer: Self-pay

## 2020-04-07 DIAGNOSIS — E782 Mixed hyperlipidemia: Secondary | ICD-10-CM

## 2020-04-07 DIAGNOSIS — I10 Essential (primary) hypertension: Secondary | ICD-10-CM

## 2020-04-07 NOTE — Progress Notes (Signed)
Patient had an appt with Sherre Poot, PharmD on 03/23/2020.

## 2020-04-12 ENCOUNTER — Other Ambulatory Visit: Payer: Self-pay | Admitting: Physician Assistant

## 2020-04-12 NOTE — Telephone Encounter (Signed)
For you -

## 2020-04-13 ENCOUNTER — Other Ambulatory Visit: Payer: Self-pay

## 2020-04-13 MED ORDER — FUROSEMIDE 20 MG PO TABS: 20.0000 mg | ORAL_TABLET | Freq: Every day | ORAL | 3 refills | Status: DC | PRN

## 2020-04-26 DIAGNOSIS — J9611 Chronic respiratory failure with hypoxia: Secondary | ICD-10-CM | POA: Diagnosis not present

## 2020-04-26 DIAGNOSIS — J410 Simple chronic bronchitis: Secondary | ICD-10-CM | POA: Diagnosis not present

## 2020-04-29 DIAGNOSIS — M5106 Intervertebral disc disorders with myelopathy, lumbar region: Secondary | ICD-10-CM | POA: Diagnosis not present

## 2020-04-29 DIAGNOSIS — J441 Chronic obstructive pulmonary disease with (acute) exacerbation: Secondary | ICD-10-CM | POA: Diagnosis not present

## 2020-05-02 ENCOUNTER — Encounter: Payer: Self-pay | Admitting: Family Medicine

## 2020-05-02 ENCOUNTER — Ambulatory Visit (INDEPENDENT_AMBULATORY_CARE_PROVIDER_SITE_OTHER): Payer: Medicare Other | Admitting: Family Medicine

## 2020-05-02 ENCOUNTER — Other Ambulatory Visit: Payer: Self-pay

## 2020-05-02 VITALS — BP 140/72 | HR 92 | Temp 97.0°F | Resp 18 | Ht 71.0 in | Wt 289.0 lb

## 2020-05-02 DIAGNOSIS — M1A0420 Idiopathic chronic gout, left hand, without tophus (tophi): Secondary | ICD-10-CM

## 2020-05-02 DIAGNOSIS — J418 Mixed simple and mucopurulent chronic bronchitis: Secondary | ICD-10-CM

## 2020-05-02 DIAGNOSIS — I119 Hypertensive heart disease without heart failure: Secondary | ICD-10-CM | POA: Diagnosis not present

## 2020-05-02 DIAGNOSIS — F17219 Nicotine dependence, cigarettes, with unspecified nicotine-induced disorders: Secondary | ICD-10-CM

## 2020-05-02 DIAGNOSIS — I5042 Chronic combined systolic (congestive) and diastolic (congestive) heart failure: Secondary | ICD-10-CM

## 2020-05-02 DIAGNOSIS — I110 Hypertensive heart disease with heart failure: Secondary | ICD-10-CM

## 2020-05-02 DIAGNOSIS — Z6839 Body mass index (BMI) 39.0-39.9, adult: Secondary | ICD-10-CM

## 2020-05-02 DIAGNOSIS — G308 Other Alzheimer's disease: Secondary | ICD-10-CM

## 2020-05-02 DIAGNOSIS — R251 Tremor, unspecified: Secondary | ICD-10-CM

## 2020-05-02 DIAGNOSIS — F0280 Dementia in other diseases classified elsewhere without behavioral disturbance: Secondary | ICD-10-CM

## 2020-05-02 DIAGNOSIS — E782 Mixed hyperlipidemia: Secondary | ICD-10-CM

## 2020-05-02 MED ORDER — PROPRANOLOL HCL ER 60 MG PO CP24: 60.0000 mg | ORAL_CAPSULE | Freq: Every day | ORAL | 2 refills | Status: DC

## 2020-05-02 NOTE — Patient Instructions (Addendum)
TREMOR: START ON PROPRANOLOL ER 60 MG ONCE DAILY.  Copd - performist one twice a day. Quit smoking.  Increase Lasix to 40 mg once daily.  Take potassium 99 three daily.

## 2020-05-02 NOTE — Progress Notes (Signed)
Established Patient Office Visit  Subjective:  Patient ID: Shawn Osborn, male    DOB: 1951-02-09  Age: 69 y.o. MRN: [id number]  CC:  Chief Complaint  Patient presents with  . Hypertension  . Hyperlipidemia  . COPD    HPI Shawn Osborn presents for 10-month follow-up of numerous medical issues.  1)  Patient has hypertensive heart disease with heart failure. Taking furosemide 20 mg once diaily as needed. All his bp medicines had been discontinued 4-6 months ago.   2) Hyperlipidemia: Crestor 5 mg once daily.  He is trying to eat healthier.  Denies exercise.  3) Idiopathic peripheral neuropathy/RLS - Currently on Lyrica 75 mg one three times a day.   4) Depression: Controlled on zoloft.    5) Gout:  He is currently on allopurinol for prevention and has colchicine for flare ups.   6) COPD - Currently on brovana and yupelri. He has ventolin HFA for use prn.   7) Chronic Pain Syndrome: In his back and in his knees. Taking oxycodone IR 15 mg one four times a day. He takes glucosamine chondroitin.   Past Medical History:  Diagnosis Date  . Acute combined systolic and diastolic CHF, NYHA class 3 ([place]) 10/08/2019  . Acute delirium 10/08/2019  . Acute exacerbation of CHF (congestive heart failure) ([place]) 10/08/2019  . Acute gout of multiple sites 07/01/2016  . Acute respiratory failure with hypoxia and hypercapnia ([employer]) 10/08/2019  . Allergy   . Anxiety   . Arthritis    Gout, osteoarthritis in Back and Knees  . Cataract   . CHF (congestive heart failure) ([place])   . Chronic bronchitis   . Community acquired pneumonia 10/08/2019  . COPD (chronic obstructive pulmonary disease) ([place]) 10/08/2019  . Dementia ([place]) 10/08/2019  . Elevated troponin 10/08/2019  . Emphysema of lung ([place])   . Essential hypertension 07/03/2016  . GERD (gastroesophageal reflux disease)   . History of kidney stones   . HLD (hyperlipidemia) 10/08/2019  . Hypercapnic respiratory failure ([place]) 10/08/2019   . Hyperlipidemia   . Hypertension   . Hypertensive heart disease 10/08/2019  . Morbid obesity due to excess calories ([place]) 10/08/2019  . Narcotic overdose ([place]) 10/08/2019  . 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 NSTEMI (non-ST elevated myocardial infarction) ([place]) 10/08/2019  . Obstructive sleep apnea   . Occlusion and stenosis of carotid artery without mention of cerebral infarction 12/13/2011  . OSA on CPAP 10/08/2019  . Pain in joint of left shoulder 01/12/2019  . Paranoia (psychosis) ([place]) 07/01/2016  . Poorly-controlled hypertension 10/08/2019  . Prediabetes   . Psychotic disorder with delusions ([place]) 07/01/2016  . Respiratory failure with hypoxia and hypercapnia ([place]) 10/08/2019  . Restless leg syndrome 07/03/2016  . Rheumatoid arthritis(714.0)   . RLS (restless legs syndrome)   . Sepsis ([place]) 10/08/2019  . Sleep apnea   . Spinal stenosis   . Syncope 04/03/2019  . Tear of left rotator cuff 01/12/2019  . Toxic metabolic encephalopathy 60/63/0160  . UTI (urinary tract infection) 10/08/2019  . Vascular dementia with behavioral disturbance ([place]) 07/06/2016    Past Surgical History:  Procedure Laterality Date  . APPENDECTOMY    . CARPAL TUNNEL RELEASE    . EYE SURGERY    . INTRAVASCULAR PRESSURE WIRE/FFR Osborn N/A 10/20/2019   Procedure: INTRAVASCULAR PRESSURE WIRE/FFR Osborn;  Surgeon: Belva Crome, MD;  Location: [facility];  Service: Cardiovascular;  Laterality: N/A;  . LEFT  HEART CATH AND CORONARY ANGIOGRAPHY N/A 10/20/2019   Procedure: LEFT HEART CATH AND CORONARY ANGIOGRAPHY;  Surgeon: Belva Crome, MD;  Location: [facility];  Service: Cardiovascular;  Laterality: N/A;  . [place], 2003, 2012   Laminectomy X 3    Family History  Problem Relation Age of Onset  . Heart disease Mother   . Hypertension Mother     Social History   Socioeconomic History  . Marital status: Widowed    Spouse name: Not on file  .  Number of children: 4  . Years of education: Not on file  . Highest education level: Not on file  Occupational History  . Not on file  Tobacco Use  . Smoking status: Current Every Day Smoker    Packs/day: 0.50    Years: 53.00    Pack years: 26.50    Last attempt to quit: 11/06/2018    Years since quitting: 1.4  . Smokeless tobacco: Current User    Types: Chew  Vaping Use  . Vaping Use: Never used  Substance and Sexual Activity  . Alcohol use: No  . Drug use: No  . Sexual activity: Not on file  Other Topics Concern  . Not on file  Social History Narrative  . Not on file   Social Determinants of Health   Financial Resource Strain:   . Difficulty of Paying Living Expenses:   Food Insecurity: No Food Insecurity  . Worried About Charity fundraiser in the Last Year: Never true  . Ran Out of Food in the Last Year: Never true  Transportation Needs:   . Lack of Transportation (Medical):   Marland Kitchen Lack of Transportation (Non-Medical):   Physical Activity:   . Days of Exercise per Week:   . Minutes of Exercise per Session:   Stress:   . Feeling of Stress :   Social Connections:   . Frequency of Communication with Friends and Family:   . Frequency of Social Gatherings with Friends and Family:   . Attends Religious Services:   . Active Member of Clubs or Organizations:   . Attends Archivist Meetings:   Marland Kitchen Marital Status:   Intimate Partner Violence:   . Fear of Current or Ex-Partner:   . Emotionally Abused:   Marland Kitchen Physically Abused:   . Sexually Abused:     Outpatient Medications Prior to Visit  Medication Sig Dispense Refill  . albuterol (VENTOLIN HFA) 108 (90 Base) MCG/ACT inhaler Inhale 2 puffs into the lungs every 6 (six) hours as needed for wheezing or shortness of breath. 18 g 5  . allopurinol (ZYLOPRIM) 300 MG tablet TAKE ONE TABLET BY MOUTH EVERY DAY 30 tablet 5  . Ascorbic Acid (VITAMIN C) 500 MG CAPS Take 500 mg by mouth daily.    . colchicine 0.6 MG tablet  Take 1 tablet (0.6 mg total) by mouth daily. (Patient taking differently: Take 0.6 mg by mouth daily as needed. ) 14 tablet 3  . docusate sodium (COLACE) 100 MG capsule Take 100 mg by mouth daily as needed for mild constipation.    Marland Kitchen donepezil (ARICEPT) 10 MG tablet Take 1 tablet (10 mg total) by mouth at bedtime. 30 tablet 2  . furosemide (LASIX) 20 MG tablet Take 1 tablet (20 mg total) by mouth daily as needed. 30 tablet 3  . Glucosamine-Chondroit-Vit C-Mn (GLUCOSAMINE CHONDR 1500 COMPLX PO) Take by mouth daily.    . hydrOXYzine (ATARAX/VISTARIL) 50 MG tablet Take 1  tablet (50 mg total) by mouth at bedtime. 30 tablet 2  . omeprazole (PRILOSEC) 40 MG capsule Take 40 mg by mouth daily.    Marland Kitchen oxyCODONE (ROXICODONE) 15 MG immediate release tablet TAKE ONE TABLET BY MOUTH 4 TIMES DAILY 120 tablet 0  . Potassium 99 MG TABS Take 297 mg by mouth daily as needed.     . pregabalin (LYRICA) 75 MG capsule Take 75 mg by mouth in the morning, at noon, and at bedtime.     . rosuvastatin (CRESTOR) 5 MG tablet TAKE ONE TABLET BY MOUTH AT BEDTIME 30 tablet 2  . sertraline (ZOLOFT) 100 MG tablet TAKE ONE TABLET BY MOUTH AT BEDTIME 30 tablet 0  . YUPELRI 175 MCG/3ML nebulizer solution Take 3 mLs (175 mcg total) by nebulization every evening. 90 mL 11  . arformoterol (BROVANA) 15 MCG/2ML NEBU Take 2 mLs (15 mcg total) by nebulization 2 (two) times daily. (Patient not taking: Reported on 05/02/2020) 120 mL 11  . cephALEXin (KEFLEX) 500 MG capsule Take 1 capsule (500 mg total) by mouth 2 (two) times daily. (Patient not taking: Reported on 03/23/2020) 14 capsule 0  . PERFLUTREN LIPID MICROSPHERE Inject 1-10 mLs into the vein as needed. (Patient not taking: Reported on 03/23/2020) 1 mL 0  . Psyllium (GERI-MUCIL) 68 % POWD Take by mouth daily.     Facility-Administered Medications Prior to Visit  Medication Dose Route Frequency Provider Last Rate Last Admin  . triamcinolone acetonide (KENALOG-40) injection 40 mg  40 mg  Intra-articular Once Rochel Brome, MD        Allergies  Allergen Reactions  . Avelox [Moxifloxacin Hcl In Nacl] Swelling  . Codeine Phosphate Itching  . Cozaar     Unknown   . Cymbalta [Duloxetine Hcl]     Twitching  . Ropinirole     Weakness, dizziness, felt sick  . Statins     myalgia  . Welchol [Colesevelam Hcl]   . Penicillins Rash    Did it involve swelling of the face/tongue/throat, SOB, or low BP? No Did it involve sudden or severe rash/hives, skin peeling, or any reaction on the inside of your mouth or nose? Yes Did you need to seek medical attention at a hospital or doctor's office? Yes When did it last happen?15 + years If all above answers are "NO", may proceed with cephalosporin use.     ROS Review of Systems  Constitutional: Negative for chills, diaphoresis and fever.  HENT: Positive for congestion. Negative for ear pain and sore throat.   Respiratory: Negative for cough, shortness of breath and wheezing.   Cardiovascular: Positive for leg swelling. Negative for chest pain and palpitations.  Gastrointestinal: Negative for abdominal pain, constipation, diarrhea, nausea and vomiting.  Genitourinary: Negative for dysuria and urgency.  Musculoskeletal: Positive for arthralgias and back pain.  Neurological: Positive for tremors (requesting a medicine.) and numbness.  Psychiatric/Behavioral: Negative for dysphoric mood.       NO ANHEDONIA      Objective:    Physical Exam Constitutional:      Appearance: He is well-developed.     Comments: Obese.  Cardiovascular:     Rate and Rhythm: Normal rate and regular rhythm.     Heart sounds: Normal heart sounds.  Pulmonary:     Effort: Pulmonary effort is normal.     Breath sounds: Normal breath sounds.  Abdominal:     General: Bowel sounds are normal.     Palpations: Abdomen is soft.  Tenderness: There is no abdominal tenderness.  Musculoskeletal:        General: No tenderness.     Comments: Lumbar  spine/paraspinal muscles.   Skin:    General: Skin is warm.  Neurological:     Mental Status: He is alert and oriented to person, place, and time.     Comments: Voluntary tremor of both hands.   Psychiatric:        Behavior: Behavior normal.     BP 140/72   Pulse 92   Temp (!) 97 F (36.1 C)   Resp 18   Ht 5\' 11"  (1.803 m)   Wt 289 lb (131.1 kg)   BMI 40.31 kg/m  Wt Readings from Last 3 Encounters:  05/02/20 289 lb (131.1 kg)  03/01/20 286 lb (129.7 kg)  02/26/20 286 lb (129.7 kg)     Health Maintenance Due  Topic Date Due  . Hepatitis C Screening  Never done  . COVID-19 Vaccine (1) Never done  . TETANUS/TDAP  Never done  . COLONOSCOPY  Never done    There are no preventive care reminders to display for this patient.  No results found for: TSH Lab Results  Component Value Date   WBC 12.1 (H) 01/26/2020   HGB 10.4 (L) 01/26/2020   HCT 35.6 (L) 01/26/2020   MCV 75 (L) 01/26/2020   PLT 431 01/26/2020   Lab Results  Component Value Date   NA 143 01/26/2020   K 5.4 (H) 01/26/2020   CO2 28 01/26/2020   GLUCOSE 79 01/26/2020   BUN 14 01/26/2020   CREATININE 0.68 (L) 01/26/2020   BILITOT <0.2 01/26/2020   ALKPHOS 83 01/26/2020   AST 14 01/26/2020   ALT 10 01/26/2020   PROT 6.4 01/26/2020   ALBUMIN 3.9 01/26/2020   CALCIUM 9.1 01/26/2020   Lab Results  Component Value Date   CHOL 129 01/26/2020   Lab Results  Component Value Date   HDL 42 01/26/2020   Lab Results  Component Value Date   LDLCALC 70 01/26/2020   Lab Results  Component Value Date   TRIG 87 01/26/2020   Lab Results  Component Value Date   CHOLHDL 3.1 01/26/2020   No results found for: HGBA1C    Assessment & Plan:  1. Mixed hyperlipidemia Well controlled.  No changes to medicines.  Continue to work on eating a healthy diet and exercise.  Labs drawn today.  - Lipid panel  2. Benign hypertensive heart disease with congestive cardiac failure ([place]) Compensated. Follow with  cardiology. - CBC with Differential/Platelet - Comprehensive metabolic panel  3. Gout of left hand. Well controlled. The current medical regimen is effective;  continue present plan and medications.  4. Simple chronic bronchitis ([place]) Fair control. Continue current nebulizers/inhalers. Strongly recommend quit smoking AGAIN, as he had quit and restarted. He is smoking 1/2 ppd. .  5. Coronary artery disease of native artery of native heart with stable angina pectoris (HCC)/Mixed hyperlipidemia Recommend continue medications. Mgmt per cardiology.  6. Class 2 severe obesity due to excess calories with serious comorbidity and body mass index (BMI) of 39.0 to 39.9 in adult [facility]) Recommend low fat and dash diet.  7. Cigarette nicotine dependence with nicotine-induced disorder Strongly recommend quit smoking!  8. Tremor - start on propranol Er 60 mg once daily   Problem List Items Addressed This Visit    None      No orders of the defined types were placed in this encounter.  Follow-up: No follow-ups on file.    Arsenio Katz, CMA

## 2020-05-03 ENCOUNTER — Other Ambulatory Visit: Payer: Self-pay

## 2020-05-03 ENCOUNTER — Encounter: Payer: Self-pay | Admitting: Family Medicine

## 2020-05-03 DIAGNOSIS — M1A0420 Idiopathic chronic gout, left hand, without tophus (tophi): Secondary | ICD-10-CM | POA: Insufficient documentation

## 2020-05-03 LAB — CBC WITH DIFFERENTIAL/PLATELET
Basophils Absolute: 0.1 10*3/uL (ref 0.0–0.2)
Basos: 1 %
EOS (ABSOLUTE): 0.3 10*3/uL (ref 0.0–0.4)
Eos: 2 %
Hematocrit: 37.5 % (ref 37.5–51.0)
Hemoglobin: 10.8 g/dL — ABNORMAL LOW (ref 13.0–17.7)
Immature Grans (Abs): 0 10*3/uL (ref 0.0–0.1)
Immature Granulocytes: 0 %
Lymphocytes Absolute: 2.7 10*3/uL (ref 0.7–3.1)
Lymphs: 20 %
MCH: 20.2 pg — ABNORMAL LOW (ref 26.6–33.0)
MCHC: 28.8 g/dL — ABNORMAL LOW (ref 31.5–35.7)
MCV: 70 fL — ABNORMAL LOW (ref 79–97)
Monocytes Absolute: 0.9 10*3/uL (ref 0.1–0.9)
Monocytes: 7 %
Neutrophils Absolute: 9.5 10*3/uL — ABNORMAL HIGH (ref 1.4–7.0)
Neutrophils: 70 %
Platelets: 390 10*3/uL (ref 150–450)
RBC: 5.34 x10E6/uL (ref 4.14–5.80)
RDW: 18.8 % — ABNORMAL HIGH (ref 11.6–15.4)
WBC: 13.5 10*3/uL — ABNORMAL HIGH (ref 3.4–10.8)

## 2020-05-03 LAB — LIPID PANEL
Chol/HDL Ratio: 3.6 ratio (ref 0.0–5.0)
Cholesterol, Total: 142 mg/dL (ref 100–199)
HDL: 40 mg/dL (ref >39)
LDL Chol Calc (NIH): 75 mg/dL (ref 0–99)
Triglycerides: 154 mg/dL — ABNORMAL HIGH (ref 0–149)
VLDL Cholesterol Cal: 27 mg/dL (ref 5–40)

## 2020-05-03 LAB — COMPREHENSIVE METABOLIC PANEL
ALT: 15 IU/L (ref 0–44)
AST: 23 IU/L (ref 0–40)
Albumin/Globulin Ratio: 1.2 (ref 1.2–2.2)
Albumin: 3.7 g/dL — ABNORMAL LOW (ref 3.8–4.8)
Alkaline Phosphatase: 88 IU/L (ref 48–121)
BUN/Creatinine Ratio: 20 (ref 10–24)
BUN: 14 mg/dL (ref 8–27)
Bilirubin Total: 0.2 mg/dL (ref 0.0–1.2)
CO2: 24 mmol/L (ref 20–29)
Calcium: 9.5 mg/dL (ref 8.6–10.2)
Chloride: 97 mmol/L (ref 96–106)
Creatinine, Ser: 0.69 mg/dL — ABNORMAL LOW (ref 0.76–1.27)
GFR calc Af Amer: 112 mL/min/{1.73_m2} (ref >59)
GFR calc non Af Amer: 97 mL/min/{1.73_m2} (ref >59)
Globulin, Total: 3 g/dL (ref 1.5–4.5)
Glucose: 84 mg/dL (ref 65–99)
Potassium: 5.3 mmol/L — ABNORMAL HIGH (ref 3.5–5.2)
Sodium: 139 mmol/L (ref 134–144)
Total Protein: 6.7 g/dL (ref 6.0–8.5)

## 2020-05-03 LAB — CARDIOVASCULAR RISK ASSESSMENT

## 2020-05-03 LAB — TSH: TSH: 2.36 u[IU]/mL (ref 0.450–4.500)

## 2020-05-10 ENCOUNTER — Other Ambulatory Visit: Payer: Self-pay | Admitting: Family Medicine

## 2020-05-12 ENCOUNTER — Other Ambulatory Visit: Payer: Self-pay | Admitting: Family Medicine

## 2020-05-19 ENCOUNTER — Other Ambulatory Visit: Payer: Self-pay

## 2020-05-19 MED ORDER — ALBUTEROL SULFATE HFA 108 (90 BASE) MCG/ACT IN AERS: 2.0000 | INHALATION_SPRAY | Freq: Four times a day (QID) | RESPIRATORY_TRACT | 5 refills | Status: DC | PRN

## 2020-05-19 MED ORDER — ARFORMOTEROL TARTRATE 15 MCG/2ML IN NEBU: 15.0000 ug | INHALATION_SOLUTION | Freq: Two times a day (BID) | RESPIRATORY_TRACT | 11 refills | Status: DC

## 2020-05-20 ENCOUNTER — Telehealth: Payer: Self-pay

## 2020-05-20 NOTE — Telephone Encounter (Signed)
PA submitted and denied for Brovana via covermymeds. Patient is currently in the process of acquiring patient assistance for this medication.

## 2020-05-26 DIAGNOSIS — J410 Simple chronic bronchitis: Secondary | ICD-10-CM | POA: Diagnosis not present

## 2020-05-26 DIAGNOSIS — J9611 Chronic respiratory failure with hypoxia: Secondary | ICD-10-CM | POA: Diagnosis not present

## 2020-05-29 DIAGNOSIS — J441 Chronic obstructive pulmonary disease with (acute) exacerbation: Secondary | ICD-10-CM | POA: Diagnosis not present

## 2020-05-29 DIAGNOSIS — M5106 Intervertebral disc disorders with myelopathy, lumbar region: Secondary | ICD-10-CM | POA: Diagnosis not present

## 2020-05-30 ENCOUNTER — Telehealth: Payer: Medicare Other

## 2020-05-30 NOTE — Chronic Care Management (AMB) (Deleted)
Chronic Care Management Pharmacy  Name: Shawn Osborn  MRN: [id number] DOB: 08-Jan-1951  Chief Complaint/ HPI  Shawn Osborn,  69 y.o. , male presents for their Initial CCM visit with the clinical pharmacist via telephone due to COVID-19 Pandemic.  PCP : Shawn Brome, MD  Their chronic conditions include: HTN, NSTEMI, CAD, COPD, Alzheimer's disease, Vascular dementia, Gout, HLD, RLS.  Office Visits: 05/20/2020 - Shawn Osborn is too expensive. Patient picked up samples of Breztril and filled out PAP form.  05/02/2020 - Start on propanolol ER 60 mg for tremor daily.  03/01/2020 - Shoulder injection for pain. Hydroxyzine 50 mg qhs for sleep. 02/26/2020 -Increase aricept to 10 mg qpm. Keflex prescribed since toenail removed in office. Gave prn colchicine to have on hand for gout flares.  01/26/2020 - s/t colchicine for gout flare, pregabalin 75 mg tid,  11/18/2019 - AWV with nurse.  10/26/2019 - a1c 5.3%. Anemia stable.  10/08/2019 - Labs stable.  10/06/2019 - increased valsartan to bid. Patient complaint of dizziness.  10/02/2019 - Patient discharged from [facility] due to recurrent syncope.  Consult Visit: 03/17/2020 - Cardiologist results from echo showed heart is not relaxing as much as it should 02/09/2020 - no med changes. BP well controlled. 11/16/2019 - Goal cholesterol ldl <70. Patient managed at Crestor 5 but consider alternative or increased dose if not at goal of <70. Patient has CAD. BP meds ok for now monitor for additional syncope or hypotension.  10/20/2019 - Ordered catherization, advised weight loss and 30 day heart monitor.  Medications: Outpatient Encounter Medications as of 05/30/2020  Medication Sig  . albuterol (VENTOLIN HFA) 108 (90 Base) MCG/ACT inhaler Inhale 2 puffs into the lungs every 6 (six) hours as needed for wheezing or shortness of breath.  . allopurinol (ZYLOPRIM) 300 MG tablet TAKE ONE TABLET BY MOUTH EVERY DAY  . arformoterol (BROVANA) 15 MCG/2ML NEBU Take  2 mLs (15 mcg total) by nebulization 2 (two) times daily.  . Ascorbic Acid (VITAMIN C) 500 MG CAPS Take 500 mg by mouth daily.  . colchicine 0.6 MG tablet Take 1 tablet (0.6 mg total) by mouth daily. (Patient taking differently: Take 0.6 mg by mouth daily as needed. )  . docusate sodium (COLACE) 100 MG capsule Take 100 mg by mouth daily as needed for mild constipation.  Marland Kitchen donepezil (ARICEPT) 10 MG tablet TAKE ONE TABLET BY MOUTH AT BEDTIME  . ferrous sulfate 325 (65 FE) MG tablet Take 325 mg by mouth daily with breakfast.  . furosemide (LASIX) 20 MG tablet Take 1 tablet (20 mg total) by mouth daily as needed.  . Glucosamine-Chondroit-Vit C-Mn (GLUCOSAMINE CHONDR 1500 COMPLX PO) Take by mouth daily.  . hydrOXYzine (ATARAX/VISTARIL) 50 MG tablet TAKE ONE TABLET BY MOUTH AT BEDTIME  . omeprazole (PRILOSEC) 40 MG capsule Take 40 mg by mouth daily.  Marland Kitchen oxyCODONE (ROXICODONE) 15 MG immediate release tablet TAKE ONE TABLET BY MOUTH 4 TIMES DAILY  . Potassium 99 MG TABS Take 297 mg by mouth daily as needed.   . pregabalin (LYRICA) 75 MG capsule TAKE ONE CAPSULE BY MOUTH EVERY DAY and TAKE ONE CAPSULE BY MOUTH EVERY DAY and TAKE ONE CAPSULE BY MOUTH AT BEDTIME  . propranolol ER (INDERAL LA) 60 MG 24 hr capsule Take 1 capsule (60 mg total) by mouth daily.  . rosuvastatin (CRESTOR) 5 MG tablet TAKE ONE TABLET BY MOUTH AT BEDTIME  . sertraline (ZOLOFT) 100 MG tablet TAKE ONE TABLET BY MOUTH AT BEDTIME  . YUPELRI  175 MCG/3ML nebulizer solution Take 3 mLs (175 mcg total) by nebulization every evening.   Facility-Administered Encounter Medications as of 05/30/2020  Medication  . triamcinolone acetonide (KENALOG-40) injection 40 mg   Allergies  Allergen Reactions  . Avelox [Moxifloxacin Hcl In Nacl] Swelling  . Codeine Phosphate Itching  . Cozaar     Unknown   . Cymbalta [Duloxetine Hcl]     Twitching  . Ropinirole     Weakness, dizziness, felt sick  . Statins     myalgia  . Welchol [Colesevelam Hcl]    . Penicillins Rash    Did it involve swelling of the face/tongue/throat, SOB, or low BP? No Did it involve sudden or severe rash/hives, skin peeling, or any reaction on the inside of your mouth or nose? Yes Did you need to seek medical attention at a hospital or doctor's office? Yes When did it last happen?15 + years If all above answers are "NO", may proceed with cephalosporin use.    SDOH Screenings   Alcohol Screen:   . Last Alcohol Screening Score (AUDIT):   Depression (PHQ2-9): Low Risk   . PHQ-2 Score: 0  Financial Resource Strain:   . Difficulty of Paying Living Expenses:   Food Insecurity: No Food Insecurity  . Worried About Charity fundraiser in the Last Year: Never true  . Ran Out of Food in the Last Year: Never true  Housing:   . Last Housing Risk Score:   Physical Activity:   . Days of Exercise per Week:   . Minutes of Exercise per Session:   Social Connections:   . Frequency of Communication with Friends and Family:   . Frequency of Social Gatherings with Friends and Family:   . Attends Religious Services:   . Active Member of Clubs or Organizations:   . Attends Archivist Meetings:   Marland Kitchen Marital Status:   Stress:   . Feeling of Stress :   Tobacco Use: High Risk  . Smoking Tobacco Use: Current Every Day Smoker  . Smokeless Tobacco Use: Current User  Transportation Needs:   . Lack of Transportation (Medical):   Marland Kitchen Lack of Transportation (Non-Medical):      Current Diagnosis/Assessment:  Goals Addressed   None     COPD / Asthma / Tobacco    Eosinophil count:  No results found for: EOSPCT%                               Eos (Absolute):  Lab Results  Component Value Date/Time   EOSABS 0.3 05/02/2020 12:00 AM    Tobacco Status:  Social History   Tobacco Use  Smoking Status Current Every Day Smoker  . Packs/day: 0.50  . Years: 53.00  . Pack years: 26.50  . Last attempt to quit: 11/06/2018  . Years since quitting: 1.5    Smokeless Tobacco Current User  . Types: Chew    Patient has failed these meds in past: Symbicort, duoneb, performist, nasonex Patient is currently controlled on the following medications: Yupelri, albuterol and Brovana Using maintenance inhaler regularly? Yes Frequency of rescue inhaler use:  1-2x per week  We discussed:  Patient is having difficulty affording the [place]. Pharmacist is following up with the mail order pharmacy supplying this medication.   Plan  Continue current medications ,  Hypertension   BP today is:  <140/90  Office blood pressures are  BP Readings from Last  3 Encounters:  05/02/20 140/72  03/01/20 (!) 152/88  02/26/20 (!) 148/76    Patient has failed these meds in the past:atenolol, clonidine, isosorbide, lisinopril, metoprolol, valsartan, spironolactone Patient is currently controlledon the following medications: furosemide prn  Patient checks BP at home daily  Patient home BP readings are ranging: 130/80 mmHg  We discussed diet and exercise extensively. Patient has lost some weight. Was 325 lbs and last week weighed 278 lbs. Has cut back on salt. Has cut back on drinks. Also cut back eating pork and organ meat.   Plan  Continue current medications   Hyperlipidemia   Lipid Panel     Component Value Date/Time   CHOL 142 05/02/2020 0000   TRIG 154 (H) 05/02/2020 0000   HDL 40 05/02/2020 0000   CHOLHDL 3.6 05/02/2020 0000   LDLCALC 75 05/02/2020 0000   LABVLDL 27 05/02/2020 0000     The ASCVD Risk score (Goff DC Jr., et al., 2013) failed to calculate for the following reasons:   The patient has a prior MI or stroke diagnosis   Patient has failed these meds in past: n/a Patient is currently controlled on the following medications: Rosuvastatin  We discussed:  diet and exercise extensively. Patient has done a great job with weight loss and diet since a hospitalization a few months ago. He indicates he rarely drinks soft  drinks any more and watches what he eats. He does some of his therapy exercises at home for mobility and stays active outdoors.   Plan  Continue current medications     and  Gout    Patient has failed these meds in past: n/a Patient is currently controlled on the following medications: allopurinol 300 mg daily, colchicine 0.6 mg daily prn  We discussed:  Patient reports if he avoids drinks, liver and pork, he has very few issues with gout. Patient has colchicine at home if needed for future flares.   Plan  Continue current medications   Alzheimer's Disease   Patient has failed these meds in past: n/a Patient is currently controlled on the following medications: donepezil 10 mg daily  We discussed:  Patient is pleased with current regimen.   Plan  Continue current medications   Health Maintenance   Patient is currently controlled on the following medications:  Potassium OTC - cramps Stool softener prn - constipation Sertraline 100 mg - depression  We discussed:  Patient reports overall health improved with weight loss.   Plan  Continue current medications  Vaccines   Reviewed and discussed patient's vaccination history.  Has had both COVID vaccines.   Immunization History  Administered Date(s) Administered  . Influenza, Seasonal, Injecte, Preservative Fre 08/19/2018  . Influenza-Unspecified 08/19/2018  . Pneumococcal Conjugate-13 08/19/2017  . Pneumococcal Polysaccharide-23 08/20/2015    Plan  Recommended patient receive annual flu vaccine in office.   Medication Management   Pt uses [facility] for all medications Uses pill box? Yes Pt endorses good compliance  We discussed: Patient is receiving blisterpacking through his current pharmacy. He is pleased with the service and helps him take his medications appropriately.   Plan  Continue current medication management strategy    Follow up: 2 month phone visit

## 2020-06-02 ENCOUNTER — Ambulatory Visit: Payer: Self-pay

## 2020-06-02 NOTE — Chronic Care Management (AMB) (Signed)
Contacted patient to update.   Patient Assistance Approved for Breztri from [place].   Approval date: 05/20/2020-11/18/2020

## 2020-06-03 ENCOUNTER — Telehealth: Payer: Medicare Other

## 2020-06-07 ENCOUNTER — Other Ambulatory Visit: Payer: Self-pay | Admitting: Family Medicine

## 2020-06-07 ENCOUNTER — Other Ambulatory Visit: Payer: Self-pay | Admitting: Physician Assistant

## 2020-06-08 ENCOUNTER — Ambulatory Visit: Payer: Medicare Other

## 2020-06-08 DIAGNOSIS — J418 Mixed simple and mucopurulent chronic bronchitis: Secondary | ICD-10-CM

## 2020-06-08 DIAGNOSIS — E782 Mixed hyperlipidemia: Secondary | ICD-10-CM

## 2020-06-08 DIAGNOSIS — I10 Essential (primary) hypertension: Secondary | ICD-10-CM

## 2020-06-08 NOTE — Chronic Care Management (AMB) (Signed)
Chronic Care Management Pharmacy  Name: Shawn Osborn  MRN: [id number] DOB: 19-Feb-1951  Chief Complaint/ HPI  Shawn Osborn,  69 y.o. , male presents for their Follow-Up CCM visit with the clinical pharmacist via telephone due to COVID-19 Pandemic.  PCP : Rochel Brome, MD  Their chronic conditions include: HTN, NSTEMI, CAD, COPD, Alzheimer's disease, Vascular dementia, Gout, HLD, RLS.  Office Visits: 05/20/2020 - [name] is too expensive. Patient picked up samples of Breztril and filled out PAP form.  05/02/2020 - Start on propanolol ER 60 mg for tremor daily.  03/01/2020 - Shoulder injection for pain. Hydroxyzine 50 mg qhs for sleep. 02/26/2020 -Increase aricept to 10 mg qpm. Keflex prescribed since toenail removed in office. Gave prn colchicine to have on hand for gout flares.  01/26/2020 - s/t colchicine for gout flare, pregabalin 75 mg tid,  11/18/2019 - AWV with nurse.  10/26/2019 - a1c 5.3%. Anemia stable.  10/08/2019 - Labs stable.  10/06/2019 - increased valsartan to bid. Patient complaint of dizziness.  10/02/2019 - Patient [facility] due to recurrent syncope.  Consult Visit: 03/17/2020 - Cardiologist results from echo showed heart is not relaxing as much as it should 02/09/2020 - no med changes. BP well controlled. 11/16/2019 - Goal cholesterol ldl <70. Patient managed at Crestor 5 but consider alternative or increased dose if not at goal of <70. Patient has CAD. BP meds ok for now monitor for additional syncope or hypotension.  10/20/2019 - Ordered catherization, advised weight loss and 30 day heart monitor.  Medications: Outpatient Encounter Medications as of 06/08/2020  Medication Sig  . albuterol (VENTOLIN HFA) 108 (90 Base) MCG/ACT inhaler Inhale 2 puffs into the lungs every 6 (six) hours as needed for wheezing or shortness of breath.  . donepezil (ARICEPT) 10 MG tablet TAKE ONE TABLET BY MOUTH AT BEDTIME  . allopurinol (ZYLOPRIM) 300 MG tablet TAKE  ONE TABLET BY MOUTH EVERY DAY  . arformoterol (BROVANA) 15 MCG/2ML NEBU Take 2 mLs (15 mcg total) by nebulization 2 (two) times daily. (Patient not taking: Reported on 06/08/2020)  . Ascorbic Acid (VITAMIN C) 500 MG CAPS Take 500 mg by mouth daily.  . colchicine 0.6 MG tablet Take 1 tablet (0.6 mg total) by mouth daily. (Patient taking differently: Take 0.6 mg by mouth daily as needed. )  . docusate sodium (COLACE) 100 MG capsule Take 100 mg by mouth daily as needed for mild constipation.  . ferrous sulfate 325 (65 FE) MG tablet Take 325 mg by mouth daily with breakfast.  . furosemide (LASIX) 20 MG tablet Take 1 tablet (20 mg total) by mouth daily as needed.  . Glucosamine-Chondroit-Vit C-Mn (GLUCOSAMINE CHONDR 1500 COMPLX PO) Take by mouth daily.  . hydrOXYzine (ATARAX/VISTARIL) 50 MG tablet TAKE ONE TABLET BY MOUTH AT BEDTIME  . omeprazole (PRILOSEC) 40 MG capsule Take 40 mg by mouth daily.  Marland Kitchen oxyCODONE (ROXICODONE) 15 MG immediate release tablet TAKE ONE TABLET BY MOUTH 4 TIMES DAILY  . Potassium 99 MG TABS Take 297 mg by mouth daily as needed.   . pregabalin (LYRICA) 75 MG capsule TAKE ONE CAPSULE BY MOUTH EVERY DAY and TAKE ONE CAPSULE BY MOUTH EVERY DAY and TAKE ONE CAPSULE BY MOUTH AT BEDTIME  . propranolol ER (INDERAL LA) 60 MG 24 hr capsule Take 1 capsule (60 mg total) by mouth daily.  . rosuvastatin (CRESTOR) 5 MG tablet TAKE ONE TABLET BY MOUTH AT BEDTIME  . sertraline (ZOLOFT) 100 MG tablet TAKE ONE TABLET BY  MOUTH AT BEDTIME  . YUPELRI 175 MCG/3ML nebulizer solution Take 3 mLs (175 mcg total) by nebulization every evening. (Patient not taking: Reported on 06/08/2020)   Facility-Administered Encounter Medications as of 06/08/2020  Medication  . triamcinolone acetonide (KENALOG-40) injection 40 mg   Allergies  Allergen Reactions  . Avelox [Moxifloxacin Hcl In Nacl] Swelling  . Codeine Phosphate Itching  . Cozaar     Unknown   . Cymbalta [Duloxetine Hcl]     Twitching  .  Ropinirole     Weakness, dizziness, felt sick  . Statins     myalgia  . Welchol [Colesevelam Hcl]   . Penicillins Rash    Did it involve swelling of the face/tongue/throat, SOB, or low BP? No Did it involve sudden or severe rash/hives, skin peeling, or any reaction on the inside of your mouth or nose? Yes Did you need to seek medical attention at a hospital or doctor's office? Yes When did it last happen?15 + years If all above answers are "NO", may proceed with cephalosporin use.    SDOH Screenings   Alcohol Screen:   . Last Alcohol Screening Score (AUDIT):   Depression (PHQ2-9): Low Risk   . PHQ-2 Score: 0  Financial Resource Strain:   . Difficulty of Paying Living Expenses:   Food Insecurity: No Food Insecurity  . Worried About Charity fundraiser in the Last Year: Never true  . Ran Out of Food in the Last Year: Never true  Housing:   . Last Housing Risk Score:   Physical Activity:   . Days of Exercise per Week:   . Minutes of Exercise per Session:   Social Connections:   . Frequency of Communication with Friends and Family:   . Frequency of Social Gatherings with Friends and Family:   . Attends Religious Services:   . Active Member of Clubs or Organizations:   . Attends Archivist Meetings:   Marland Kitchen Marital Status:   Stress:   . Feeling of Stress :   Tobacco Use: High Risk  . Smoking Tobacco Use: Current Every Day Smoker  . Smokeless Tobacco Use: Current User  Transportation Needs:   . Lack of Transportation (Medical):   Marland Kitchen Lack of Transportation (Non-Medical):      Current Diagnosis/Assessment:  Goals Addressed            This Visit's Progress   . COMPLETED: Pharmacy Care Plan - COPD Medication Assistance       CARE PLAN ENTRY (see longitudinal plan of care for additional care plan information)  Current Barriers:  . Financial Barriers: patient has [employer] and reports copay for [employer] is cost prohibitive at this time. His  portion of the cost is >$300/month.  Pharmacist Clinical Goal(s):  Marland Kitchen Over the next 30 days, patient will work with PharmD and providers to relieve medication access concerns  Interventions: . Comprehensive medication review completed; medication list updated in electronic medical record.  Bertram Savin care team collaboration (see longitudinal plan of care)   Patient Self Care Activities:  . Patient will provide necessary portions of application   Initial goal documentation     . Pharmacy Care Plan - HLD       CARE PLAN ENTRY (see longitudinal plan of care for additional care plan information)  Current Barriers:  . Chronic Disease Management support, education, and care coordination needs related to Hypertension, Hyperlipidemia, and COPD   Hypertension BP Readings from Last 3 Encounters:  05/02/20 140/72  03/01/20 (!) 152/88  02/26/20 (!) 148/76   . Pharmacist Clinical Goal(s): o Over the next 90 days, patient will work with PharmD and providers to maintain BP goal <130/80 . Current regimen:  o Furosemide 20 mg daily as needed . Interventions: o Discussed being mindful of sodium in diet to avoid swelling.  . Patient self care activities - Over the next 90 days, patient will: o Check BP weekly, document, and provide at future appointments o Ensure daily salt intake < 2300 mg/day  Hyperlipidemia Lab Results  Component Value Date/Time   LDLCALC 75 05/02/2020 12:00 AM   . Pharmacist Clinical Goal(s): o Over the next 90 days, patient will work with PharmD and providers to maintain LDL goal < 100 . Current regimen:  o Rosuvastatin 5 mg daily  . Interventions: o Discussed low fat diet and regular exercise.  . Patient self care activities - Over the next 90 days, patient will: o Continue taking medications as prescribed.  o Contact pharmacist or provider if needed.   COPD . Pharmacist Clinical Goal(s) o Over the next 90 days, patient will work with PharmD and  providers to manage symptoms of COPD.  . Current regimen:  o Breztri 2 puffs twice daily o Ventolin 2 puffs q 6 hours as needed for wheezing or shortness of breath . Interventions: o Coordinated refill of albuterol.   o Verified patient received Breztri via patient assistance.  o Counseled patient on appropriate use of Breztri as maintenance inhaler and albuterol if needed in between.  . Patient self care activities - Over the next 90 days, patient will: o Recommend patient continue to take medication as prescribed.  o Contact pharmacist or provider with any questions or concerns.   Medication management . Pharmacist Clinical Goal(s): o Over the next 90 days, patient will work with PharmD and providers to achieve optimal medication adherence . Current pharmacy: [facility] . Interventions o Comprehensive medication review performed. o Continue current medication management strategy . Patient self care activities - Over the next 90 days, patient will: o Focus on medication adherence by continuing to use packaging service at pharmacy.  o Take medications as prescribed o Report any questions or concerns to PharmD and/or provider(s)  Please see past updates related to this goal by clicking on the "Past Updates" button in the selected goal         COPD / Asthma / Tobacco    Eosinophil count:  No results found for: EOSPCT%                               Eos (Absolute):  Lab Results  Component Value Date/Time   EOSABS 0.3 05/02/2020 12:00 AM    Tobacco Status:  Social History   Tobacco Use  Smoking Status Current Every Day Smoker  . Packs/day: 0.50  . Years: 53.00  . Pack years: 26.50  . Last attempt to quit: 11/06/2018  . Years since quitting: 1.5  Smokeless Tobacco Current User  . Types: Chew    Patient has failed these meds in past: Symbicort, duoneb, performist, nasonex Patient is currently controlled on the following medications: Breztri and albuterol Using  maintenance inhaler regularly? Yes Frequency of rescue inhaler use:  1-2x per week  We discussed:  Patient is having difficulty affording the [place]. Pharmacist is following up with the mail order pharmacy supplying this medication.   Update 06/08/2020 - Patient reports that the  inhaler is helping his breathing. He received his inhalers through Patient Assistance. He reports that he does an extra puff of Breztri if he has trouble breathing through the night. Pharmacist discussed the importance of using Breztri as prescribed 2 puffs twice daily and using albuterol if shortness of breath between doses as a rescue inhaler.   Plan  Continue current medications ,  Hypertension   BP today is:  <140/90  Office blood pressures are  BP Readings from Last 3 Encounters:  05/02/20 140/72  03/01/20 (!) 152/88  02/26/20 (!) 148/76    Patient has failed these meds in the past:atenolol, clonidine, isosorbide, lisinopril, metoprolol, valsartan, spironolactone Patient is currently controlledon the following medications: furosemide prn  Patient checks BP at home daily  Patient home BP readings are ranging: 130/80 mmHg  We discussed diet and exercise extensively. Patient has lost some weight. Was 325 lbs and last week weighed 278 lbs. Has cut back on salt. Has cut back on drinks. Also cut back eating pork and organ meat.   Update 06/08/2020 - Patient reports good control at home.   Plan  Continue current medications   Hyperlipidemia   Lipid Panel     Component Value Date/Time   CHOL 142 05/02/2020 0000   TRIG 154 (H) 05/02/2020 0000   HDL 40 05/02/2020 0000   CHOLHDL 3.6 05/02/2020 0000   LDLCALC 75 05/02/2020 0000   LABVLDL 27 05/02/2020 0000     The ASCVD Risk score (Goff DC Jr., et al., 2013) failed to calculate for the following reasons:   The patient has a prior MI or stroke diagnosis   Patient has failed these meds in past: n/a Patient is currently controlled on  the following medications: Rosuvastatin  We discussed:  diet and exercise extensively. Patient has done a great job with weight loss and diet since a hospitalization a few months ago. He indicates he rarely drinks soft drinks any more and watches what he eats. He does some of his therapy exercises at home for mobility and stays active outdoors.   Update 06/08/2020 - Recommend patient take medication as prescribed and strive for a low fat diet.   Plan  Continue current medications     and  Gout    Patient has failed these meds in past: n/a Patient is currently controlled on the following medications: allopurinol 300 mg daily, colchicine 0.6 mg daily prn  We discussed:  Patient reports if he avoids drinks, liver and pork, he has very few issues with gout. Patient has colchicine at home if needed for future flares.   Plan  Continue current medications   Alzheimer's Disease   Patient has failed these meds in past: n/a Patient is currently controlled on the following medications: donepezil 10 mg daily  We discussed:  Patient is pleased with current regimen.   Update 06/09/2020 - Patient reports that he is concerned about his memory. Pharmacist recommended he come in for an appointment with PCP to discuss options.   Plan  Continue current medications and follow-up with PCP about memory   Health Maintenance   Patient is currently controlled on the following medications:  Potassium OTC - cramps Stool softener prn - constipation Sertraline 100 mg - depression Propanolol ER 60 mg daily - tremor  We discussed:  Patient reports overall health improved with weight loss.   Update 06/09/2020 - Patient reports stable on medications and continues to use packaging from local pharmacy.   Plan  Continue current medications  Vaccines   Reviewed and discussed patient's vaccination history.  Has had both COVID vaccines.   Immunization History  Administered Date(s) Administered  .  Influenza, Seasonal, Injecte, Preservative Fre 08/19/2018  . Influenza-Unspecified 08/19/2018  . Pneumococcal Conjugate-13 08/19/2017  . Pneumococcal Polysaccharide-23 08/20/2015    Plan  Recommended patient receive annual flu vaccine in office.   Medication Management   Pt uses [facility] pharmacy for all medications Uses pill box? Yes Pt endorses good compliance  We discussed: Patient is receiving blisterpacking through his current pharmacy. He is pleased with the service and helps him take his medications appropriately.   Plan  Continue current medication management strategy    Follow up: 2 month phone visit

## 2020-06-09 NOTE — Patient Instructions (Addendum)
Visit Information  Goals Addressed            This Visit's Progress   . COMPLETED: Pharmacy Care Plan - COPD Medication Assistance       CARE PLAN ENTRY (see longitudinal plan of care for additional care plan information)  Current Barriers:  . Financial Barriers: patient has [employer] and reports copay for [employer] is cost prohibitive at this time. His portion of the cost is >$300/month.  Pharmacist Clinical Goal(s):  Marland Kitchen Over the next 30 days, patient will work with PharmD and providers to relieve medication access concerns  Interventions: . Comprehensive medication review completed; medication list updated in electronic medical record.  Bertram Savin care team collaboration (see longitudinal plan of care)   Patient Self Care Activities:  . Patient will provide necessary portions of application   Initial goal documentation     . Pharmacy Care Plan - HLD       CARE PLAN ENTRY (see longitudinal plan of care for additional care plan information)  Current Barriers:  . Chronic Disease Management support, education, and care coordination needs related to Hypertension, Hyperlipidemia, and COPD   Hypertension BP Readings from Last 3 Encounters:  05/02/20 140/72  03/01/20 (!) 152/88  02/26/20 (!) 148/76   . Pharmacist Clinical Goal(s): o Over the next 90 days, patient will work with PharmD and providers to maintain BP goal <130/80 . Current regimen:  o Furosemide 20 mg daily as needed . Interventions: o Discussed being mindful of sodium in diet to avoid swelling.  . Patient self care activities - Over the next 90 days, patient will: o Check BP weekly, document, and provide at future appointments o Ensure daily salt intake < 2300 mg/day  Hyperlipidemia Lab Results  Component Value Date/Time   LDLCALC 75 05/02/2020 12:00 AM   . Pharmacist Clinical Goal(s): o Over the next 90 days, patient will work with PharmD and providers to maintain LDL goal <  100 . Current regimen:  o Rosuvastatin 5 mg daily  . Interventions: o Discussed low fat diet and regular exercise.  . Patient self care activities - Over the next 90 days, patient will: o Continue taking medications as prescribed.  o Contact pharmacist or provider if needed.   COPD . Pharmacist Clinical Goal(s) o Over the next 90 days, patient will work with PharmD and providers to manage symptoms of COPD.  . Current regimen:  o Breztri 2 puffs twice daily o Ventolin 2 puffs q 6 hours as needed for wheezing or shortness of breath . Interventions: o Coordinated refill of albuterol.   o Verified patient received Breztri via patient assistance.  o Counseled patient on appropriate use of Breztri as maintenance inhaler and albuterol if needed in between.  . Patient self care activities - Over the next 90 days, patient will: o Recommend patient continue to take medication as prescribed.  o Contact pharmacist or provider with any questions or concerns.   Medication management . Pharmacist Clinical Goal(s): o Over the next 90 days, patient will work with PharmD and providers to achieve optimal medication adherence . Current pharmacy: [facility] . Interventions o Comprehensive medication review performed. o Continue current medication management strategy . Patient self care activities - Over the next 90 days, patient will: o Focus on medication adherence by continuing to use packaging service at pharmacy.  o Take medications as prescribed o Report any questions or concerns to PharmD and/or provider(s)  Please see past updates related to this  goal by clicking on the "Past Updates" button in the selected goal         The patient verbalized understanding of instructions provided today and declined a print copy of patient instruction materials.   Telephone follow up appointment with pharmacy team member scheduled for: October 2021   Sherre Poot, PharmD, [facility] Clinical  Pharmacist [facility] [phone] (office) [phone] (mobile)  Alzheimer's Disease Alzheimer's disease is a brain disease that affects memory, thinking, language, and behavior. People with Alzheimer's disease lose mental abilities, and the disease gets worse over time. Alzheimer's disease is a form of dementia. What are the causes? This condition develops when a protein called beta-amyloid forms deposits in the brain. It is not known what causes these deposits to form. Alzheimer's disease may also be caused by a gene mutation that is inherited from one parent or both parents. A gene mutation is a harmful change in a gene. Not everyone who inherits the genetic mutation will get the disease. What increases the risk? You are more likely to develop this condition if you:  Are older than age 10.  Have a family history of dementia.  Have had a brain injury.  Have heart or blood vessel disease.  Have had a stroke.  Have high blood pressure or high cholesterol.  Have diabetes. What are the signs or symptoms? Symptoms of this condition may happen in three stages, which often overlap. Early stage In this stage, you may continue to be independent. You may still be able to drive, work, and be social. Symptoms in this stage include:  Minor memory problems, such as forgetting a name or what you read.  Difficulty with: ? Paying attention. ? Communicating. ? Doing familiar tasks. ? Problem solving or doing calculations. ? Following instructions. ? Learning new things.  Anxiety.  Social withdrawal.  Loss of motivation. Moderate stage In this stage, you will start to need care. Symptoms in this stage include:  Difficulty with expressing thoughts.  Memory loss that affects daily life. This can include forgetting: ? Your address or phone number. ? Recent events that have happened. ? Parts of your personal history, such as where you went to school.  Confusion about  where you are or what time it is.  Difficulty in judging distance.  Changes in personality, mood, and behavior. You may be moody, irritable, angry, frustrated, fearful, anxious, or suspicious.  Poor reasoning and judgment.  Delusions or hallucinations.  Changes in sleep patterns.  Wandering and getting lost, even in familiar places. Severe stage In the final stage, you will need help with your personal care and daily activities. Symptoms in this stage include:  Worsening memory loss.  Personality changes.  Loss of awareness of your surroundings.  Changes in physical abilities, including the ability to walk, sit, and swallow.  Difficulty in communicating.  Inability to control your bladder and bowels.  Increasing confusion.  Increasing behavior changes. How is this diagnosed? This condition is diagnosed by a health care provider who specializes in diseases of the nervous system (neurologist). Other causes of dementia may also be ruled out. Your health care provider will talk with you and your family, friends, or caregivers about your history and symptoms. A thorough medical history will be taken, and you will have a physical exam and tests. Tests may include:  Lab tests, such as blood or urine tests.  Imaging tests, such as a CT scan, a PET scan, or an MRI.  A lumbar puncture. This  test involves removing and testing a small amount of the fluid that surrounds the brain and spinal cord.  An electroencephalogram (EEG). In this test, small metal discs are used to measure electrical activity in the brain.  Memory tests, cognitive tests, and neuropsychological tests. These tests evaluate brain function.  Genetic testing may be done if you have early onset of the disease (before age 33) or if other family members have the disease. How is this treated? At this time, there is no treatment to cure Alzheimer's disease or stop it from getting worse. The goals of treatment are:  To  slow down symptoms of the disease, if possible.  To manage behavioral changes.  To provide you with a safe environment.  To help manage daily life for you and your caregivers. The following treatment options are available:  Medicines. Medicines may help to slow down memory loss and manage behavioral symptoms.  Cognitive therapy. Cognitive therapy provides you with education, support, and memory aids. It is most helpful in the early stages of the condition.  Counseling or spiritual guidance. It is normal to have a lot of feelings, including anger, relief, fear, and isolation. Counseling and guidance can help you deal with these feelings.  Caregiving. This involves having caregivers help you with your daily activities.  Family support groups. These provide education, emotional support, and information about community resources to family members who are taking care of you. Follow these instructions at home:  Medicines  Take over-the-counter and prescription medicines only as told by your health care provider.  Use a pill organizer or pill reminder to help you manage your medicines.  Avoid taking medicines that can affect thinking, such as pain medicines or sleeping medicines. Lifestyle  Make healthy lifestyle choices: ? Be physically active as told by your health care provider. Regular exercise may help improve symptoms. ? Do not use any products that contain nicotine or tobacco, such as cigarettes, e-cigarettes, and chewing tobacco. If you need help quitting, ask your health care provider. ? Do not drink alcohol. ? Eat a healthy diet. ? Practice stress-management techniques when you get stressed. ? Stay social.  Drink enough fluid to keep your urine pale yellow.  Make sure to get quality sleep. ? Avoid taking long naps during the day. Take short naps of 30 minutes or less if needed. ? Keep your sleeping area dark and cool. ? Avoid exercising during the few hours before you go  to bed. ? Avoid caffeine products in the afternoon and evening. General instructions  Work with your health care provider to determine what you need help with and what your safety needs are.  If you were given a bracelet that identifies you as a person with memory loss or tracks your location, make sure to wear it at all times.  Talk with your health care provider about whether it is safe for you to drive.  Work with your family to make important decisions, such as advance directives, medical power of attorney, or a living will.  Keep all follow-up visits as told by your health care provider. This is important. Where to find more information  The Alzheimer's Association: Call the 24-hour helpline at 1-(701) 872-3620, or visit [employer] Contact a health care provider if:  You have nausea, vomiting, or trouble with eating.  You have dizziness or weakness.  You or your family members become concerned for your safety. Get help right away if:  You feel depressed or sad, or feel that you want  to harm yourself.  You develop chest pain or difficulty with breathing.  You pass out. If you ever feel like you may hurt yourself or others, or have thoughts about taking your own life, get help right away. You can go to your nearest emergency department or call:  Your local emergency services (911 in the [place]  A suicide crisis helpline, such as the [facility] at 432-046-0301. This is open 24 hours a day. Summary  Alzheimer's disease is a brain disease that affects memory, thinking, language, and behavior. Alzheimer's disease is a form of dementia.  This condition is diagnosed by a specialist in diseases of the nervous system (neurologist).  At this time, there is no treatment to cure Alzheimer's disease or stop it from getting worse. The goals of treatment are to slow memory loss and help you manage any symptoms.  Work with your family to make important  decisions, such as advance directives, medical power of attorney, or a living will. This information is not intended to replace advice given to you by your health care provider. Make sure you discuss any questions you have with your health care provider. Document Revised: 10/14/2018 Document Reviewed: 10/14/2018 Elsevier Patient Education  2020 [employer].

## 2020-06-10 ENCOUNTER — Other Ambulatory Visit: Payer: Self-pay

## 2020-06-10 MED ORDER — ALBUTEROL SULFATE HFA 108 (90 BASE) MCG/ACT IN AERS: 2.0000 | INHALATION_SPRAY | Freq: Four times a day (QID) | RESPIRATORY_TRACT | 5 refills | Status: DC | PRN

## 2020-06-13 ENCOUNTER — Telehealth: Payer: Self-pay

## 2020-06-13 NOTE — Telephone Encounter (Signed)
PA for Ventolin submitted and approved via covermymeds.

## 2020-06-26 DIAGNOSIS — J9611 Chronic respiratory failure with hypoxia: Secondary | ICD-10-CM | POA: Diagnosis not present

## 2020-06-26 DIAGNOSIS — J410 Simple chronic bronchitis: Secondary | ICD-10-CM | POA: Diagnosis not present

## 2020-06-28 ENCOUNTER — Encounter: Payer: Self-pay | Admitting: Family Medicine

## 2020-06-28 ENCOUNTER — Other Ambulatory Visit: Payer: Self-pay

## 2020-06-28 ENCOUNTER — Ambulatory Visit (INDEPENDENT_AMBULATORY_CARE_PROVIDER_SITE_OTHER): Payer: Medicare Other | Admitting: Family Medicine

## 2020-06-28 VITALS — BP 136/68 | HR 71 | Temp 96.8°F | Ht 71.0 in | Wt 293.0 lb

## 2020-06-28 DIAGNOSIS — N138 Other obstructive and reflux uropathy: Secondary | ICD-10-CM | POA: Diagnosis not present

## 2020-06-28 DIAGNOSIS — G479 Sleep disorder, unspecified: Secondary | ICD-10-CM | POA: Diagnosis not present

## 2020-06-28 DIAGNOSIS — R5383 Other fatigue: Secondary | ICD-10-CM | POA: Diagnosis not present

## 2020-06-28 DIAGNOSIS — R351 Nocturia: Secondary | ICD-10-CM

## 2020-06-28 DIAGNOSIS — J441 Chronic obstructive pulmonary disease with (acute) exacerbation: Secondary | ICD-10-CM

## 2020-06-28 DIAGNOSIS — N401 Enlarged prostate with lower urinary tract symptoms: Secondary | ICD-10-CM

## 2020-06-28 LAB — POCT URINALYSIS DIP (CLINITEK)
Bilirubin, UA: NEGATIVE
Blood, UA: NEGATIVE
Glucose, UA: NEGATIVE mg/dL
Ketones, POC UA: NEGATIVE mg/dL
Nitrite, UA: NEGATIVE
POC PROTEIN,UA: NEGATIVE
Spec Grav, UA: 1.015 (ref 1.010–1.025)
Urobilinogen, UA: 0.2 E.U./dL
pH, UA: 7 (ref 5.0–8.0)

## 2020-06-28 MED ORDER — CEFTRIAXONE SODIUM 1 G IJ SOLR
1.0000 g | Freq: Once | INTRAMUSCULAR | Status: AC
  Administered 2020-06-28: 1 g via INTRAMUSCULAR

## 2020-06-28 MED ORDER — CEFDINIR 300 MG PO CAPS
300.0000 mg | ORAL_CAPSULE | Freq: Two times a day (BID) | ORAL | 0 refills | Status: DC
Start: 2020-06-28 — End: 2020-07-19

## 2020-06-28 MED ORDER — TRIAMCINOLONE ACETONIDE 40 MG/ML IJ SUSP
80.0000 mg | Freq: Once | INTRAMUSCULAR | Status: AC
  Administered 2020-06-28: 80 mg via INTRAMUSCULAR

## 2020-06-28 MED ORDER — TAMSULOSIN HCL 0.4 MG PO CAPS: 0.4000 mg | ORAL_CAPSULE | Freq: Every day | ORAL | 3 refills | Status: DC

## 2020-06-28 NOTE — Progress Notes (Signed)
Acute Office Visit  Subjective:    Patient ID: Shawn Osborn, male    DOB: 10-10-1951, 69 y.o.   MRN: [id number]  Chief Complaint  Patient presents with  . Insomnia    HPI  Patient is in today for insomnia states he has had this issue for the last 2 weeks. Feels tired but stays sleepy all day, he has been taking naps through the day from 30 minutes to 1 hour and then can not sleep at night. Patient is not being awakened by pain. Nocturia occurs 4 times. Bed and pillow are comfortable. Patient is not drinking a lot of caffeine.   Acute bronchitis: started about one week ago. DOE and coughing. No chest pain. Breztri 2 puffs twice daily. Ventolin 2 puffs q 6 hours as needed for wheezing or shortness of breath. Using ventolin twice a day. Has had covid 19 vaccinations.   Past Medical History:  Diagnosis Date  . Acute combined systolic and diastolic CHF, NYHA class 3 ([place]) 10/08/2019  . Acute delirium 10/08/2019  . Acute exacerbation of CHF (congestive heart failure) ([place]) 10/08/2019  . Acute gout of multiple sites 07/01/2016  . Acute respiratory failure with hypoxia and hypercapnia ([place]) 10/08/2019  . Allergy   . Anxiety   . Arthritis    Gout, osteoarthritis in Back and Knees  . Cataract   . CHF (congestive heart failure) ([place])   . Chronic bronchitis   . Community acquired pneumonia 10/08/2019  . COPD (chronic obstructive pulmonary disease) ([employer]) 10/08/2019  . Dementia ([place]) 10/08/2019  . Elevated troponin 10/08/2019  . Emphysema of lung ([place])   . Essential hypertension 07/03/2016  . GERD (gastroesophageal reflux disease)   . History of kidney stones   . HLD (hyperlipidemia) 10/08/2019  . Hypercapnic respiratory failure ([place]) 10/08/2019  . Hyperlipidemia   . Hypertension   . Hypertensive heart disease 10/08/2019  . Morbid obesity due to excess calories ([place]) 10/08/2019  . Narcotic overdose ([place]) 10/08/2019  . 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  NSTEMI (non-ST elevated myocardial infarction) ([place]) 10/08/2019  . Obstructive sleep apnea   . Occlusion and stenosis of carotid artery without mention of cerebral infarction 12/13/2011  . OSA on CPAP 10/08/2019  . Pain in joint of left shoulder 01/12/2019  . Paranoia (psychosis) ([place]) 07/01/2016  . Poorly-controlled hypertension 10/08/2019  . Prediabetes   . Psychotic disorder with delusions ([place]) 07/01/2016  . Respiratory failure with hypoxia and hypercapnia ([place]) 10/08/2019  . Restless leg syndrome 07/03/2016  . Rheumatoid arthritis(714.0)   . RLS (restless legs syndrome)   . Sepsis ([place]) 10/08/2019  . Sleep apnea   . Spinal stenosis   . Syncope 04/03/2019  . Tear of left rotator cuff 01/12/2019  . Toxic metabolic encephalopathy 26/94/8546  . UTI (urinary tract infection) 10/08/2019  . Vascular dementia with behavioral disturbance ([place]) 07/06/2016    Past Surgical History:  Procedure Laterality Date  . APPENDECTOMY    . CARPAL TUNNEL RELEASE    . EYE SURGERY    . INTRAVASCULAR PRESSURE WIRE/FFR STUDY N/A 10/20/2019   Procedure: INTRAVASCULAR PRESSURE WIRE/FFR STUDY;  Surgeon: Belva Crome, MD;  Location: [facility];  Service: Cardiovascular;  Laterality: N/A;  . LEFT HEART CATH AND CORONARY ANGIOGRAPHY N/A 10/20/2019   Procedure: LEFT HEART CATH AND CORONARY ANGIOGRAPHY;  Surgeon: Belva Crome, MD;  Location: [facility];  Service: Cardiovascular;  Laterality: N/A;  . SPINE SURGERY  1998, 2003, 2012   Laminectomy X 3    Family History  Problem Relation Age of Onset  . Heart disease Mother   . Hypertension Mother     Social History   Socioeconomic History  . Marital status: Widowed    Spouse name: Not on file  . Number of children: 4  . Years of education: Not on file  . Highest education level: Not on file  Occupational History  . Not on file  Tobacco Use  . Smoking status: Current Every Day Smoker    Packs/day: 0.50    Years: 53.00    Pack years:  26.50    Last attempt to quit: 11/06/2018    Years since quitting: 1.6  . Smokeless tobacco: Current User    Types: Chew  Vaping Use  . Vaping Use: Never used  Substance and Sexual Activity  . Alcohol use: No  . Drug use: No  . Sexual activity: Not on file  Other Topics Concern  . Not on file  Social History Narrative  . Not on file   Social Determinants of Health   Financial Resource Strain:   . Difficulty of Paying Living Expenses:   Food Insecurity: No Food Insecurity  . Worried About Charity fundraiser in the Last Year: Never true  . Ran Out of Food in the Last Year: Never true  Transportation Needs:   . Lack of Transportation (Medical):   Marland Kitchen Lack of Transportation (Non-Medical):   Physical Activity:   . Days of Exercise per Week:   . Minutes of Exercise per Session:   Stress:   . Feeling of Stress :   Social Connections:   . Frequency of Communication with Friends and Family:   . Frequency of Social Gatherings with Friends and Family:   . Attends Religious Services:   . Active Member of Clubs or Organizations:   . Attends Archivist Meetings:   Marland Kitchen Marital Status:   Intimate Partner Violence:   . Fear of Current or Ex-Partner:   . Emotionally Abused:   Marland Kitchen Physically Abused:   . Sexually Abused:     Outpatient Medications Prior to Visit  Medication Sig Dispense Refill  . albuterol (VENTOLIN HFA) 108 (90 Base) MCG/ACT inhaler Inhale 2 puffs into the lungs every 6 (six) hours as needed for wheezing or shortness of breath. 18 g 5  . allopurinol (ZYLOPRIM) 300 MG tablet TAKE ONE TABLET BY MOUTH EVERY DAY 30 tablet 5  . Ascorbic Acid (VITAMIN C) 500 MG CAPS Take 500 mg by mouth daily.    . colchicine 0.6 MG tablet Take 1 tablet (0.6 mg total) by mouth daily. (Patient taking differently: Take 0.6 mg by mouth daily as needed. ) 14 tablet 3  . docusate sodium (COLACE) 100 MG capsule Take 100 mg by mouth daily as needed for mild constipation.    Marland Kitchen donepezil  (ARICEPT) 10 MG tablet TAKE ONE TABLET BY MOUTH AT BEDTIME 30 tablet 3  . ferrous sulfate 325 (65 FE) MG tablet Take 325 mg by mouth daily with breakfast.    . furosemide (LASIX) 20 MG tablet Take 1 tablet (20 mg total) by mouth daily as needed. 30 tablet 3  . Glucosamine-Chondroit-Vit C-Mn (GLUCOSAMINE CHONDR 1500 COMPLX PO) Take by mouth daily.    . hydrOXYzine (ATARAX/VISTARIL) 50 MG tablet TAKE ONE TABLET BY MOUTH AT BEDTIME 30 tablet 2  . omeprazole (PRILOSEC) 40 MG capsule Take 40 mg by mouth daily.    Marland Kitchen  oxyCODONE (ROXICODONE) 15 MG immediate release tablet TAKE ONE TABLET BY MOUTH 4 TIMES DAILY 120 tablet 0  . Potassium 99 MG TABS Take 297 mg by mouth daily as needed.     . pregabalin (LYRICA) 75 MG capsule TAKE ONE CAPSULE BY MOUTH EVERY DAY and TAKE ONE CAPSULE BY MOUTH EVERY DAY and TAKE ONE CAPSULE BY MOUTH AT BEDTIME 90 capsule 3  . propranolol ER (INDERAL LA) 60 MG 24 hr capsule Take 1 capsule (60 mg total) by mouth daily. 30 capsule 2  . rosuvastatin (CRESTOR) 5 MG tablet TAKE ONE TABLET BY MOUTH AT BEDTIME 30 tablet 2  . sertraline (ZOLOFT) 100 MG tablet TAKE ONE TABLET BY MOUTH AT BEDTIME 30 tablet 3  . arformoterol (BROVANA) 15 MCG/2ML NEBU Take 2 mLs (15 mcg total) by nebulization 2 (two) times daily. (Patient not taking: Reported on 06/08/2020) 120 mL 11  . YUPELRI 175 MCG/3ML nebulizer solution Take 3 mLs (175 mcg total) by nebulization every evening. (Patient not taking: Reported on 06/08/2020) 90 mL 11   Facility-Administered Medications Prior to Visit  Medication Dose Route Frequency Provider Last Rate Last Admin  . triamcinolone acetonide (KENALOG-40) injection 40 mg  40 mg Intra-articular Once Rochel Brome, MD        Allergies  Allergen Reactions  . Avelox [Moxifloxacin Hcl In Nacl] Swelling  . Codeine Phosphate Itching  . Cozaar     Unknown   . Cymbalta [Duloxetine Hcl]     Twitching  . Ropinirole     Weakness, dizziness, felt sick  . Statins     myalgia  .  Welchol [Colesevelam Hcl]   . Penicillins Rash    Did it involve swelling of the face/tongue/throat, SOB, or low BP? No Did it involve sudden or severe rash/hives, skin peeling, or any reaction on the inside of your mouth or nose? Yes Did you need to seek medical attention at a hospital or doctor's office? Yes When did it last happen?15 + years If all above answers are "NO", may proceed with cephalosporin use.     Review of Systems  Constitutional: Positive for fatigue. Negative for chills, diaphoresis and fever.  HENT: Positive for congestion and ear pain. Negative for sore throat.   Respiratory: Positive for cough and shortness of breath.   Cardiovascular: Negative for chest pain.  Endocrine: Positive for polyuria.  Genitourinary: Negative for dysuria and hematuria.       Nocturia.    Musculoskeletal: Positive for arthralgias (BL knees. ), back pain (low back pain. ) and myalgias.  Neurological: Positive for weakness.  Psychiatric/Behavioral: Positive for sleep disturbance.       Objective:    Physical Exam Vitals reviewed.  Constitutional:      Appearance: Normal appearance. He is obese.  HENT:     Right Ear: Tympanic membrane, ear canal and external ear normal.     Left Ear: Tympanic membrane, ear canal and external ear normal.     Nose: No congestion or rhinorrhea.     Mouth/Throat:     Mouth: Mucous membranes are moist.     Pharynx: No oropharyngeal exudate or posterior oropharyngeal erythema.  Cardiovascular:     Rate and Rhythm: Normal rate and regular rhythm.     Heart sounds: Normal heart sounds.  Pulmonary:     Effort: Pulmonary effort is normal. No respiratory distress.     Breath sounds: Wheezing (Expiratory) and rhonchi (diffusely.) present. No rales.  Abdominal:     General: Bowel sounds  are normal.     Palpations: Abdomen is soft.     Tenderness: There is no abdominal tenderness.  Lymphadenopathy:     Cervical: No cervical adenopathy.    Neurological:     Mental Status: He is alert and oriented to person, place, and time.  Psychiatric:        Mood and Affect: Mood normal.        Behavior: Behavior normal.     BP 136/68   Pulse 71   Temp (!) 96.8 F (36 C)   Ht 5\' 11"  (1.803 m)   Wt 293 lb (132.9 kg)   SpO2 99%   BMI 40.87 kg/m  Wt Readings from Last 3 Encounters:  06/28/20 293 lb (132.9 kg)  05/02/20 289 lb (131.1 kg)  03/01/20 286 lb (129.7 kg)    Health Maintenance Due  Topic Date Due  . Hepatitis C Screening  Never done  . COVID-19 Vaccine (1) Never done  . TETANUS/TDAP  Never done  . COLONOSCOPY  Never done  . INFLUENZA VACCINE  06/19/2020    There are no preventive care reminders to display for this patient.   Lab Results  Component Value Date   TSH 2.360 05/02/2020   Lab Results  Component Value Date   WBC 13.5 (H) 05/02/2020   HGB 10.8 (L) 05/02/2020   HCT 37.5 05/02/2020   MCV 70 (L) 05/02/2020   PLT 390 05/02/2020   Lab Results  Component Value Date   NA 139 05/02/2020   K 5.3 (H) 05/02/2020   CO2 24 05/02/2020   GLUCOSE 84 05/02/2020   BUN 14 05/02/2020   CREATININE 0.69 (L) 05/02/2020   BILITOT 0.2 05/02/2020   ALKPHOS 88 05/02/2020   AST 23 05/02/2020   ALT 15 05/02/2020   PROT 6.7 05/02/2020   ALBUMIN 3.7 (L) 05/02/2020   CALCIUM 9.5 05/02/2020   Lab Results  Component Value Date   CHOL 142 05/02/2020   Lab Results  Component Value Date   HDL 40 05/02/2020   Lab Results  Component Value Date   LDLCALC 75 05/02/2020   Lab Results  Component Value Date   TRIG 154 (H) 05/02/2020   Lab Results  Component Value Date   CHOLHDL 3.6 05/02/2020   No results found for: HGBA1C     Assessment & Plan:  1. Other fatigue - CBC with Differential/Platelet - VITAMIN D 25 Hydroxy (Vit-D Deficiency, Fractures) - B12 and Folate Panel - Comprehensive metabolic panel  2. Nocturia - POCT URINALYSIS DIP (CLINITEK): trace leuks. - tamsulosin (FLOMAX) 0.4 MG CAPS  capsule; Take 1 capsule (0.4 mg total) by mouth daily after supper.  Dispense: 30 capsule; Refill: 3 - PSA  3. COPD with acute exacerbation (HCC) Start cefdinir. Continue your Breztri 2 puffs twice daily and use your Ventolin 2 puffs 4 times a day over the next 2 days and after that go to 4 times a day as needed. - triamcinolone acetonide (KENALOG-40) injection 80 mg - cefTRIAXone (ROCEPHIN) injection 1 g  4. Sleep disturbance Start on flomax to decrease nocturia.  Treat bronchitis.  5. BPH with obstruction/lower urinary tract symptoms Start on tamsulosin.  - PSA - Urine Culture    Meds ordered this encounter  Medications  . triamcinolone acetonide (KENALOG-40) injection 80 mg  . cefTRIAXone (ROCEPHIN) injection 1 g  . tamsulosin (FLOMAX) 0.4 MG CAPS capsule    Sig: Take 1 capsule (0.4 mg total) by mouth daily after supper.    Dispense:  30 capsule    Refill:  3  . cefdinir (OMNICEF) 300 MG capsule    Sig: Take 1 capsule (300 mg total) by mouth 2 (two) times daily.    Dispense:  20 capsule    Refill:  0    Orders Placed This Encounter  Procedures  . Urine Culture  . CBC with Differential/Platelet  . VITAMIN D 25 Hydroxy (Vit-D Deficiency, Fractures)  . B12 and Folate Panel  . Comprehensive metabolic panel  . PSA  . POCT URINALYSIS DIP (CLINITEK)     Follow-up: Return in about 3 weeks (around 07/19/2020) for for sleep disturbance.Marland Kitchen  An After Visit Summary was printed and given to the patient.  Rochel Brome [facility] [phone]

## 2020-06-28 NOTE — Patient Instructions (Addendum)
Dalton, I am concerned your sleep problems are due to multiple things.    First you are urinating numerous times per night which is interrupting your sleep.  This is likely due to an enlarged prostate.  I would recommend starting on a medicine called tamsulosin (Flomax) once at night.   It also may be related to your bronchitis.   I have sent an antibiotic, cefdinir, and we are giving you a shot of steroids as well as a shot of  antibiotics today.  Continue your Breztri 2 puffs twice daily and use your Ventolin 2 puffs 4 times a day over the next 2 days and after that go to 4 times a day as needed.  Insomnia Insomnia is a sleep disorder that makes it difficult to fall asleep or stay asleep. Insomnia can cause fatigue, low energy, difficulty concentrating, mood swings, and poor performance at work or school. There are three different ways to classify insomnia:  Difficulty falling asleep.  Difficulty staying asleep.  Waking up too early in the morning. Any type of insomnia can be long-term (chronic) or short-term (acute). Both are common. Short-term insomnia usually lasts for three months or less. Chronic insomnia occurs at least three times a week for longer than three months. What are the causes? Insomnia may be caused by another condition, situation, or substance, such as:  Anxiety.  Certain medicines.  Gastroesophageal reflux disease (GERD) or other gastrointestinal conditions.  Asthma or other breathing conditions.  Restless legs syndrome, sleep apnea, or other sleep disorders.  Chronic pain.  Menopause.  Stroke.  Abuse of alcohol, tobacco, or illegal drugs.  Mental health conditions, such as depression.  Caffeine.  Neurological disorders, such as Alzheimer's disease.  An overactive thyroid (hyperthyroidism). Sometimes, the cause of insomnia may not be known. What increases the risk? Risk factors for insomnia include:  Gender. Women are affected more often than  men.  Age. Insomnia is more common as you get older.  Stress.  Lack of exercise.  Irregular work schedule or working night shifts.  Traveling between different time zones.  Certain medical and mental health conditions. What are the signs or symptoms? If you have insomnia, the main symptom is having trouble falling asleep or having trouble staying asleep. This may lead to other symptoms, such as:  Feeling fatigued or having low energy.  Feeling nervous about going to sleep.  Not feeling rested in the morning.  Having trouble concentrating.  Feeling irritable, anxious, or depressed. How is this diagnosed? This condition may be diagnosed based on:  Your symptoms and medical history. Your health care provider may ask about: ? Your sleep habits. ? Any medical conditions you have. ? Your mental health.  A physical exam. How is this treated? Treatment for insomnia depends on the cause. Treatment may focus on treating an underlying condition that is causing insomnia. Treatment may also include:  Medicines to help you sleep.  Counseling or therapy.  Lifestyle adjustments to help you sleep better. Follow these instructions at home: Eating and drinking   Limit or avoid alcohol, caffeinated beverages, and cigarettes, especially close to bedtime. These can disrupt your sleep.  Do not eat a large meal or eat spicy foods right before bedtime. This can lead to digestive discomfort that can make it hard for you to sleep. Sleep habits   Keep a sleep diary to help you and your health care provider figure out what could be causing your insomnia. Write down: ? When you sleep. ? When  you wake up during the night. ? How well you sleep. ? How rested you feel the next day. ? Any side effects of medicines you are taking. ? What you eat and drink.  Make your bedroom a dark, comfortable place where it is easy to fall asleep. ? Put up shades or blackout curtains to block light from  outside. ? Use a white noise machine to block noise. ? Keep the temperature cool.  Limit screen use before bedtime. This includes: ? Watching TV. ? Using your smartphone, tablet, or computer.  Stick to a routine that includes going to bed and waking up at the same times every day and night. This can help you fall asleep faster. Consider making a quiet activity, such as reading, part of your nighttime routine.  Try to avoid taking naps during the day so that you sleep better at night.  Get out of bed if you are still awake after 15 minutes of trying to sleep. Keep the lights down, but try reading or doing a quiet activity. When you feel sleepy, go back to bed. General instructions  Take over-the-counter and prescription medicines only as told by your health care provider.  Exercise regularly, as told by your health care provider. Avoid exercise starting several hours before bedtime.  Use relaxation techniques to manage stress. Ask your health care provider to suggest some techniques that may work well for you. These may include: ? Breathing exercises. ? Routines to release muscle tension. ? Visualizing peaceful scenes.  Make sure that you drive carefully. Avoid driving if you feel very sleepy.  Keep all follow-up visits as told by your health care provider. This is important. Contact a health care provider if:  You are tired throughout the day.  You have trouble in your daily routine due to sleepiness.  You continue to have sleep problems, or your sleep problems get worse. Get help right away if:  You have serious thoughts about hurting yourself or someone else. If you ever feel like you may hurt yourself or others, or have thoughts about taking your own life, get help right away. You can go to your nearest emergency department or call:  Your local emergency services (911 in the [place]  A suicide crisis helpline, such as the [facility] at  765-367-4096. This is open 24 hours a day. Summary  Insomnia is a sleep disorder that makes it difficult to fall asleep or stay asleep.  Insomnia can be long-term (chronic) or short-term (acute).  Treatment for insomnia depends on the cause. Treatment may focus on treating an underlying condition that is causing insomnia.  Keep a sleep diary to help you and your health care provider figure out what could be causing your insomnia. This information is not intended to replace advice given to you by your health care provider. Make sure you discuss any questions you have with your health care provider. Document Revised: 10/18/2017 Document Reviewed: 08/15/2017 Elsevier Patient Education  2020 [employer].

## 2020-06-29 DIAGNOSIS — M5106 Intervertebral disc disorders with myelopathy, lumbar region: Secondary | ICD-10-CM | POA: Diagnosis not present

## 2020-06-29 DIAGNOSIS — J441 Chronic obstructive pulmonary disease with (acute) exacerbation: Secondary | ICD-10-CM | POA: Diagnosis not present

## 2020-06-29 LAB — VITAMIN D 25 HYDROXY (VIT D DEFICIENCY, FRACTURES): Vit D, 25-Hydroxy: 15.7 ng/mL — ABNORMAL LOW (ref 30.0–100.0)

## 2020-06-29 LAB — COMPREHENSIVE METABOLIC PANEL
ALT: 9 IU/L (ref 0–44)
AST: 15 IU/L (ref 0–40)
Albumin/Globulin Ratio: 1.9 (ref 1.2–2.2)
Albumin: 4.1 g/dL (ref 3.8–4.8)
Alkaline Phosphatase: 81 IU/L (ref 48–121)
BUN/Creatinine Ratio: 16 (ref 10–24)
BUN: 12 mg/dL (ref 8–27)
Bilirubin Total: <0.2 mg/dL (ref 0.0–1.2)
CO2: 29 mmol/L (ref 20–29)
Calcium: 8.9 mg/dL (ref 8.6–10.2)
Chloride: 102 mmol/L (ref 96–106)
Creatinine, Ser: 0.74 mg/dL — ABNORMAL LOW (ref 0.76–1.27)
GFR calc Af Amer: 109 mL/min/{1.73_m2} (ref >59)
GFR calc non Af Amer: 94 mL/min/{1.73_m2} (ref >59)
Globulin, Total: 2.2 g/dL (ref 1.5–4.5)
Glucose: 84 mg/dL (ref 65–99)
Potassium: 5.2 mmol/L (ref 3.5–5.2)
Sodium: 147 mmol/L — ABNORMAL HIGH (ref 134–144)
Total Protein: 6.3 g/dL (ref 6.0–8.5)

## 2020-06-29 LAB — CBC WITH DIFFERENTIAL/PLATELET
Basophils Absolute: 0.1 10*3/uL (ref 0.0–0.2)
Basos: 1 %
EOS (ABSOLUTE): 0.6 10*3/uL — ABNORMAL HIGH (ref 0.0–0.4)
Eos: 5 %
Hematocrit: 37.3 % — ABNORMAL LOW (ref 37.5–51.0)
Hemoglobin: 10.5 g/dL — ABNORMAL LOW (ref 13.0–17.7)
Immature Grans (Abs): 0.1 10*3/uL (ref 0.0–0.1)
Immature Granulocytes: 1 %
Lymphocytes Absolute: 2.9 10*3/uL (ref 0.7–3.1)
Lymphs: 22 %
MCH: 21.2 pg — ABNORMAL LOW (ref 26.6–33.0)
MCHC: 28.2 g/dL — ABNORMAL LOW (ref 31.5–35.7)
MCV: 75 fL — ABNORMAL LOW (ref 79–97)
Monocytes Absolute: 0.9 10*3/uL (ref 0.1–0.9)
Monocytes: 6 %
Neutrophils Absolute: 9 10*3/uL — ABNORMAL HIGH (ref 1.4–7.0)
Neutrophils: 65 %
Platelets: 387 10*3/uL (ref 150–450)
RBC: 4.96 x10E6/uL (ref 4.14–5.80)
RDW: 21.1 % — ABNORMAL HIGH (ref 11.6–15.4)
WBC: 13.6 10*3/uL — ABNORMAL HIGH (ref 3.4–10.8)

## 2020-06-29 LAB — B12 AND FOLATE PANEL
Folate: 7.9 ng/mL (ref >3.0)
Vitamin B-12: 441 pg/mL (ref 232–1245)

## 2020-06-29 LAB — PSA: Prostate Specific Ag, Serum: 0.7 ng/mL (ref 0.0–4.0)

## 2020-06-30 ENCOUNTER — Other Ambulatory Visit: Payer: Self-pay

## 2020-06-30 ENCOUNTER — Other Ambulatory Visit: Payer: Self-pay | Admitting: Family Medicine

## 2020-06-30 DIAGNOSIS — D649 Anemia, unspecified: Secondary | ICD-10-CM

## 2020-06-30 MED ORDER — VITAMIN D (ERGOCALCIFEROL) 1.25 MG (50000 UNIT) PO CAPS
ORAL_CAPSULE | ORAL | 2 refills | Status: DC
Start: 2020-06-30 — End: 2020-09-05

## 2020-07-02 LAB — URINE CULTURE

## 2020-07-04 LAB — FERRITIN: Ferritin: 10 ng/mL — ABNORMAL LOW (ref 30–400)

## 2020-07-04 LAB — IRON AND TIBC
Iron Saturation: 4 % — CL (ref 15–55)
Iron: 16 ug/dL — ABNORMAL LOW (ref 38–169)
Total Iron Binding Capacity: 422 ug/dL (ref 250–450)
UIBC: 406 ug/dL — ABNORMAL HIGH (ref 111–343)

## 2020-07-04 LAB — SPECIMEN STATUS REPORT

## 2020-07-07 ENCOUNTER — Other Ambulatory Visit: Payer: Self-pay | Admitting: Family Medicine

## 2020-07-08 ENCOUNTER — Other Ambulatory Visit: Payer: Self-pay | Admitting: Family Medicine

## 2020-07-08 MED ORDER — NITROFURANTOIN MONOHYD MACRO 100 MG PO CAPS: 100.0000 mg | ORAL_CAPSULE | Freq: Two times a day (BID) | ORAL | 0 refills | Status: DC

## 2020-07-18 ENCOUNTER — Ambulatory Visit: Payer: Medicare Other

## 2020-07-19 ENCOUNTER — Encounter: Payer: Self-pay | Admitting: Family Medicine

## 2020-07-19 ENCOUNTER — Ambulatory Visit (INDEPENDENT_AMBULATORY_CARE_PROVIDER_SITE_OTHER): Payer: Medicare Other | Admitting: Family Medicine

## 2020-07-19 ENCOUNTER — Other Ambulatory Visit: Payer: Self-pay

## 2020-07-19 VITALS — BP 124/90 | HR 78 | Temp 94.1°F | Resp 20 | Wt 284.6 lb

## 2020-07-19 DIAGNOSIS — R0602 Shortness of breath: Secondary | ICD-10-CM

## 2020-07-19 DIAGNOSIS — K5641 Fecal impaction: Secondary | ICD-10-CM

## 2020-07-19 DIAGNOSIS — K5903 Drug induced constipation: Secondary | ICD-10-CM

## 2020-07-19 DIAGNOSIS — R358 Other polyuria: Secondary | ICD-10-CM

## 2020-07-19 DIAGNOSIS — D508 Other iron deficiency anemias: Secondary | ICD-10-CM | POA: Diagnosis not present

## 2020-07-19 DIAGNOSIS — R3589 Other polyuria: Secondary | ICD-10-CM

## 2020-07-19 LAB — POCT URINALYSIS DIPSTICK
Bilirubin, UA: NEGATIVE
Blood, UA: NEGATIVE
Glucose, UA: NEGATIVE
Ketones, UA: NEGATIVE
Leukocytes, UA: NEGATIVE
Nitrite, UA: NEGATIVE
Protein, UA: NEGATIVE
Spec Grav, UA: 1.025 (ref 1.010–1.025)
Urobilinogen, UA: 0.2 E.U./dL
pH, UA: 5 (ref 5.0–8.0)

## 2020-07-19 MED ORDER — LINACLOTIDE 145 MCG PO CAPS: 145.0000 ug | ORAL_CAPSULE | Freq: Every day | ORAL | Status: DC

## 2020-07-19 NOTE — Patient Instructions (Addendum)
Get chest xray Stop iron supplement Fecal impaction/constipation.   1) Magnesium citrate Drink 1/2 bottle. If no bm in 4 hours, drink the other.   2) For safe and effective use of laxatives in form of suppository:   If the suppository is too soft to insert, chill the suppository in the refrigerator for 30 minutes or run cold water over it, before removing the foil wrapper. To insert suppository: First remove the foil wrapper and moisten the suppository with cold water. Lie down on your side and use your finger to push the suppository well up into the rectum. Results often may be obtained with:  senna suppositories in 30 minutes, but may not occur for some individuals for up to 2 hours.   Fecal Impaction  A fecal impaction is a large, firm amount of stool (feces) that will not pass out of the body. A fecal impaction usually happens in the rectum, which is in the end of the large intestine. A fecal impaction can block the large intestine and cause significant problems. What are the causes? This condition may be caused by anything that slows down bowel movements, including:  The long-term use of laxatives, which are medicines that help you have a bowel movement.  Constipation.  Pain in the rectum. Fecal impaction can occur if you avoid having bowel movements due to the pain. Pain in the rectum can result from a medical condition, such as hemorrhoids or anal fissures.  Narcotic pain-relieving medicines, such as methadone, morphine, or codeine.  Not drinking enough fluids.  Being inactive for a long period of time.  Diseases of the brain or nervous system that damage nerves that control the muscles of the intestines. This condition may also be caused by dementia. What are the signs or symptoms? Common symptoms of this condition include:  A sense of fullness in the rectum but being unable to pass stool.  Changes in bowel patterns. This may include going to the bathroom less often or  not at all.  Not having a normal number of bowel movements.  Thin, watery discharge from the rectum.  Pain or cramps in the abdominal area. These often happen after meals. Other symptoms include:  Urinating often.  Nausea, vomiting, and dehydration.  Dizziness and confusion.  Rapid heartbeat.  Fever and sweating.  Changes in blood pressure.  Breathing problems. How is this diagnosed? This condition may be diagnosed based on your symptoms and an exam of your rectum. Sometimes, X-rays or lab tests are done to confirm the diagnosis and to check for other problems. How is this treated? This condition may be treated by:  Having your health care provider remove the stool using a gloved finger.  Taking medicine.  Using a suppository or enema in the rectum to soften the stool, which can stimulate a bowel movement. Follow these instructions at home: Eating and drinking   Drink enough fluid to keep your urine pale yellow.  Eat foods that are high in fiber, such as beans, whole grains, and fresh fruits and vegetables.  If you begin to get constipated, increase the amount of fiber in your diet. General instructions  Develop bowel habits. An example of a bowel habit is having a bowel movement right after breakfast every day. Be sure to give yourself enough time on the toilet. This may require using enemas, bowel softeners, or suppositories at home, as directed by your health care provider. It may also include using mineral oil or olive oil.  Do exercises as  told by your health care provider.  Take over-the-counter and prescription medicines only as told by your health care provider.  Keep all follow-up visits as told by your health care provider. This is important. Contact a health care provider if you:  Have ongoing pain in your rectum.  Need to use an enema or a suppository more than 2 times a week.  Have rectal bleeding.  Continue to have problems. The problems may  include not being able to go to the bathroom and having long-term constipation.  Have pain in your abdomen.  Have thin, pencil-like stools. Get help right away if:  You have black or tarry stools. Summary  A fecal impaction is a large, firm amount of stool (feces) that will not pass out of the body. A fecal impaction can block the large intestine and cause significant problems.  This condition may be caused by anything that slows down bowel movements.  This condition may be treated by having your health care provider remove the stool using a gloved finger, taking medicine, or using a suppository or enema in the rectum to soften the stool.  Develop bowel habits and eat foods that are high in fiber, such as beans, whole grains, and fresh fruits and vegetables.  Contact a health care provider if you continue to have problems. The problems may include not being able to go to the bathroom and having long-term constipation. This information is not intended to replace advice given to you by your health care provider. Make sure you discuss any questions you have with your health care provider. Document Revised: 05/05/2019 Document Reviewed: 05/05/2019 Elsevier Patient Education  [place].

## 2020-07-19 NOTE — Progress Notes (Signed)
Subjective:  Patient ID: Shawn Osborn, male    DOB: [birth year]  Age: 69 y.o. MRN: [id number]  Chief Complaint  Patient presents with  . Fatigue   HPI  Patient is in today for follow-up of insomnia states he has had this issue for the last 4 weeks.  He continues to feel very sleepy.  He is taking naps during the day.  He has significant nocturia despite restarting Flomax at his last visit 2 weeks ago. No improvement..  Patient is suffering from significant fatigue and malaise.  He has some nasal congestion.  Is also currently feeling lightheaded or nauseated.  He denies vomiting.  He is also having some significant shortness of breath and coughing.  I did treat him for COPD exacerbation with a cefdinir.  I also gave him a shot of Kenalog and Rocephin at his appointment.  He is using an inhalers which include Breztri 2 puffs twice daily as well as Ventolin 4 times a day as needed.  Patient has had both of his Covid vaccines.  In addition he has been staying at home and not exposed to anybody that he is aware up with Covid.  Lab work done at his last visit.  He was also found to have a bladder infection and completed antibiotics.  Was consistent with iron deficiency anemia.  I started him on iron fusion, as he tried ferrous sulfate in the past and this is caused him to be nauseated.  He felt like he was tolerating the iron fusion pretty well.  In addition he was found to be vitamin D deficient and was started on vitamin D 50,000 units twice weekly.. . The patient reports constipation. He reports he has not had a bowel movement in 4 days.   Current Outpatient Medications on File Prior to Visit  Medication Sig Dispense Refill  . allopurinol (ZYLOPRIM) 300 MG tablet TAKE ONE TABLET BY MOUTH EVERY DAY 30 tablet 5  . Ascorbic Acid (VITAMIN C) 500 MG CAPS Take 500 mg by mouth daily.    . colchicine 0.6 MG tablet Take 1 tablet (0.6 mg total) by mouth daily. (Patient taking differently: Take 0.6 mg  by mouth daily as needed. ) 14 tablet 3  . docusate sodium (COLACE) 100 MG capsule Take 100 mg by mouth daily as needed for mild constipation.    Marland Kitchen donepezil (ARICEPT) 10 MG tablet TAKE ONE TABLET BY MOUTH AT BEDTIME 30 tablet 3  . furosemide (LASIX) 20 MG tablet Take 1 tablet (20 mg total) by mouth daily as needed. 30 tablet 3  . Glucosamine-Chondroit-Vit C-Mn (GLUCOSAMINE CHONDR 1500 COMPLX PO) Take by mouth daily.    . hydrOXYzine (ATARAX/VISTARIL) 50 MG tablet TAKE ONE TABLET BY MOUTH AT BEDTIME 30 tablet 2  . omeprazole (PRILOSEC) 40 MG capsule TAKE ONE CAPSULE BY MOUTH EVERY DAY 30 capsule 6  . oxyCODONE (ROXICODONE) 15 MG immediate release tablet TAKE ONE TABLET BY MOUTH 4 TIMES DAILY 120 tablet 0  . Potassium 99 MG TABS Take 297 mg by mouth daily as needed.     . pregabalin (LYRICA) 75 MG capsule TAKE ONE CAPSULE BY MOUTH EVERY DAY and TAKE ONE CAPSULE BY MOUTH EVERY DAY and TAKE ONE CAPSULE BY MOUTH AT BEDTIME 90 capsule 3  . propranolol ER (INDERAL LA) 60 MG 24 hr capsule TAKE ONE CAPSULE BY MOUTH EVERY DAY 30 capsule 2  . rosuvastatin (CRESTOR) 5 MG tablet TAKE ONE TABLET BY MOUTH AT BEDTIME 30 tablet 2  .  sertraline (ZOLOFT) 100 MG tablet TAKE ONE TABLET BY MOUTH AT BEDTIME 30 tablet 3  . tamsulosin (FLOMAX) 0.4 MG CAPS capsule Take 1 capsule (0.4 mg total) by mouth daily after supper. 30 capsule 3  . Vitamin D, Ergocalciferol, (DRISDOL) 1.25 MG (50000 UNIT) CAPS capsule Take 1 capsules twice a week by mouth. 8 capsule 2   Current Facility-Administered Medications on File Prior to Visit  Medication Dose Route Frequency Provider Last Rate Last Admin  . triamcinolone acetonide (KENALOG-40) injection 40 mg  40 mg Intra-articular Once Rochel Brome, MD       Past Medical History:  Diagnosis Date  . Acute combined systolic and diastolic CHF, NYHA class 3 ([place]) 10/08/2019  . Acute delirium 10/08/2019  . Acute exacerbation of CHF (congestive heart failure) ([place]) 10/08/2019  . Acute gout  of multiple sites 07/01/2016  . Acute respiratory failure with hypoxia and hypercapnia ([place]) 10/08/2019  . Allergy   . Anxiety   . Arthritis    Gout, osteoarthritis in Back and Knees  . Cataract   . CHF (congestive heart failure) ([place])   . Chronic bronchitis   . Community acquired pneumonia 10/08/2019  . COPD (chronic obstructive pulmonary disease) ([place]) 10/08/2019  . Dementia ([place]) 10/08/2019  . Elevated troponin 10/08/2019  . Emphysema of lung ([place])   . Essential hypertension 07/03/2016  . GERD (gastroesophageal reflux disease)   . History of kidney stones   . HLD (hyperlipidemia) 10/08/2019  . Hypercapnic respiratory failure ([place]) 10/08/2019  . Hyperlipidemia   . Hypertension   . Hypertensive heart disease 10/08/2019  . Morbid obesity due to excess calories ([place]) 10/08/2019  . Narcotic overdose ([place]) 10/08/2019  . 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 NSTEMI (non-ST elevated myocardial infarction) ([place]) 10/08/2019  . Obstructive sleep apnea   . Occlusion and stenosis of carotid artery without mention of cerebral infarction 12/13/2011  . OSA on CPAP 10/08/2019  . Pain in joint of left shoulder 01/12/2019  . Paranoia (psychosis) ([employer]) 07/01/2016  . Poorly-controlled hypertension 10/08/2019  . Prediabetes   . Psychotic disorder with delusions ([place]) 07/01/2016  . Respiratory failure with hypoxia and hypercapnia ([place]) 10/08/2019  . Restless leg syndrome 07/03/2016  . Rheumatoid arthritis(714.0)   . RLS (restless legs syndrome)   . Sepsis ([place]) 10/08/2019  . Sleep apnea   . Spinal stenosis   . Syncope 04/03/2019  . Tear of left rotator cuff 01/12/2019  . Toxic metabolic encephalopathy 12/45/8099  . UTI (urinary tract infection) 10/08/2019  . Vascular dementia with behavioral disturbance ([place]) 07/06/2016   Past Surgical History:  Procedure Laterality Date  . APPENDECTOMY    . CARPAL TUNNEL RELEASE    . EYE SURGERY    . INTRAVASCULAR PRESSURE WIRE/FFR  STUDY N/A 10/20/2019   Procedure: INTRAVASCULAR PRESSURE WIRE/FFR STUDY;  Surgeon: Belva Crome, MD;  Location: [facility];  Service: Cardiovascular;  Laterality: N/A;  . LEFT HEART CATH AND CORONARY ANGIOGRAPHY N/A 10/20/2019   Procedure: LEFT HEART CATH AND CORONARY ANGIOGRAPHY;  Surgeon: Belva Crome, MD;  Location: [facility];  Service: Cardiovascular;  Laterality: N/A;  . [place], 2003, 2012   Laminectomy X 3    Family History  Problem Relation Age of Onset  . Heart disease Mother   . Hypertension Mother    Social History   Socioeconomic History  . Marital status: Widowed    Spouse name: Not on file  .  Number of children: 4  . Years of education: Not on file  . Highest education level: Not on file  Occupational History  . Not on file  Tobacco Use  . Smoking status: Current Every Day Smoker    Packs/day: 0.50    Years: 53.00    Pack years: 26.50    Last attempt to quit: 11/06/2018    Years since quitting: 1.7  . Smokeless tobacco: Current User    Types: Chew  Vaping Use  . Vaping Use: Never used  Substance and Sexual Activity  . Alcohol use: No  . Drug use: No  . Sexual activity: Not on file  Other Topics Concern  . Not on file  Social History Narrative  . Not on file   Social Determinants of Health   Financial Resource Strain:   . Difficulty of Paying Living Expenses: Not on file  Food Insecurity: No Food Insecurity  . Worried About Charity fundraiser in the Last Year: Never true  . Ran Out of Food in the Last Year: Never true  Transportation Needs:   . Lack of Transportation (Medical): Not on file  . Lack of Transportation (Non-Medical): Not on file  Physical Activity:   . Days of Exercise per Week: Not on file  . Minutes of Exercise per Session: Not on file  Stress:   . Feeling of Stress : Not on file  Social Connections:   . Frequency of Communication with Friends and Family: Not on file  . Frequency of Social  Gatherings with Friends and Family: Not on file  . Attends Religious Services: Not on file  . Active Member of Clubs or Organizations: Not on file  . Attends Archivist Meetings: Not on file  . Marital Status: Not on file    Review of Systems  Constitutional: Positive for fatigue. Negative for chills and fever.  HENT: Positive for congestion. Negative for ear pain and sore throat.   Respiratory: Positive for cough and shortness of breath.   Cardiovascular: Negative for chest pain.  Gastrointestinal: Positive for constipation and nausea. Negative for abdominal pain and vomiting.  Endocrine: Positive for polyuria.  Genitourinary: Positive for difficulty urinating. Negative for dysuria.  Neurological: Positive for light-headedness.     Objective:  BP 124/90   Pulse 78   Temp (!) 94.1 F (34.5 C)   Resp 20   Wt 284 lb 9.6 oz (129.1 kg)   SpO2 100%   BMI 39.69 kg/m   BP/Weight 07/19/2020 06/28/2020 06/30/7516  Systolic BP 001 749 449  Diastolic BP 90 68 72  Wt. (Lbs) 284.6 293 289  BMI 39.69 40.87 40.31    Physical Exam Vitals reviewed.  Constitutional:      General: He is in acute distress.     Appearance: Normal appearance. He is diaphoretic.  Neck:     Vascular: No carotid bruit.  Cardiovascular:     Rate and Rhythm: Normal rate and regular rhythm.     Heart sounds: Normal heart sounds.  Pulmonary:     Breath sounds: Wheezing and rhonchi present. No rales.     Comments: Poor air exchange.  Increased respiratory rate.  Oxygen saturation is good. Abdominal:     General: Bowel sounds are normal.     Palpations: Abdomen is soft.     Tenderness: There is no abdominal tenderness.     Comments: Patient has a large ball of stool at the proximal end of the rectum.  I attempted  did disimpact unsuccessfully.  Prostate is difficult to assess due to large bowel stool. Patient also brought Hemoccult cards back and were negative.  Musculoskeletal:     Right lower leg:  No edema.     Left lower leg: No edema.  Neurological:     Mental Status: He is alert.  Psychiatric:        Mood and Affect: Mood normal.        Behavior: Behavior normal.     Diabetic Foot Exam - Simple   No data filed       Lab Results  Component Value Date   WBC 6.8 07/19/2020   HGB 11.1 (L) 07/19/2020   HCT 37.2 (L) 07/19/2020   PLT 238 07/19/2020   GLUCOSE 82 07/19/2020   CHOL 142 05/02/2020   TRIG 154 (H) 05/02/2020   HDL 40 05/02/2020   LDLCALC 75 05/02/2020   ALT 16 07/19/2020   AST 25 07/19/2020   NA 140 07/19/2020   K 4.2 07/19/2020   CL 104 07/19/2020   CREATININE 0.75 (L) 07/19/2020   BUN 13 07/19/2020   CO2 22 07/19/2020   TSH 2.360 05/02/2020      Assessment & Plan:   1. Other iron deficiency anemia-hold iron infusion due to constipation.  May be able to restart in the future. - CBC with Differential/Platelet - Comprehensive metabolic panel - Iron, TIBC and Ferritin Panel  2. Shortness of breath likely due to COPD however I am concerned about the poor air exchange.  I strongly recommended the patient get his chest x-ray as soon as possible.  He felt too tired to go today so he said he was going the morning. - DG Chest 2 View; Future  3. Polyuria continue Flomax.  I am concerned that the reason he is having so much urination is because he cannot empty his bladder properly due to the fecal impaction.  Hopefully by alleviating this his other symptoms will improve. - POCT urinalysis dipstick  4. Drug induced constipation/Fecal impaction Recommended magnesium citrate half a bottle upon returning home.  If no BM in 4 hours take the second half of the bottle. Also recommended a senna suppository.  Education given.  I asked the patient to call back on September 1 in the afternoon if he is not had a good bowel movement.    Meds ordered this encounter  Medications  . DISCONTD: linaclotide (LINZESS) 145 MCG CAPS capsule    Sig: Take 1 capsule (145 mcg  total) by mouth daily before breakfast.    Dispense:  12 capsule    Orders Placed This Encounter  Procedures  . DG Chest 2 View  . CBC with Differential/Platelet  . Comprehensive metabolic panel  . Iron, TIBC and Ferritin Panel  . POCT urinalysis dipstick     Follow-up: Return in about 3 weeks (around 08/09/2020).  An After Visit Summary was printed and given to the patient.  Rochel Brome [facility] [phone]

## 2020-07-20 ENCOUNTER — Encounter: Payer: Self-pay | Admitting: Family Medicine

## 2020-07-20 LAB — COMPREHENSIVE METABOLIC PANEL
ALT: 16 IU/L (ref 0–44)
AST: 25 IU/L (ref 0–40)
Albumin/Globulin Ratio: 1.7 (ref 1.2–2.2)
Albumin: 3.8 g/dL (ref 3.8–4.8)
Alkaline Phosphatase: 80 IU/L (ref 48–121)
BUN/Creatinine Ratio: 17 (ref 10–24)
BUN: 13 mg/dL (ref 8–27)
Bilirubin Total: <0.2 mg/dL (ref 0.0–1.2)
CO2: 22 mmol/L (ref 20–29)
Calcium: 8.1 mg/dL — ABNORMAL LOW (ref 8.6–10.2)
Chloride: 104 mmol/L (ref 96–106)
Creatinine, Ser: 0.75 mg/dL — ABNORMAL LOW (ref 0.76–1.27)
GFR calc Af Amer: 108 mL/min/{1.73_m2} (ref >59)
GFR calc non Af Amer: 94 mL/min/{1.73_m2} (ref >59)
Globulin, Total: 2.3 g/dL (ref 1.5–4.5)
Glucose: 82 mg/dL (ref 65–99)
Potassium: 4.2 mmol/L (ref 3.5–5.2)
Sodium: 140 mmol/L (ref 134–144)
Total Protein: 6.1 g/dL (ref 6.0–8.5)

## 2020-07-20 LAB — CBC WITH DIFFERENTIAL/PLATELET
Basophils Absolute: 0 10*3/uL (ref 0.0–0.2)
Basos: 0 %
EOS (ABSOLUTE): 0 10*3/uL (ref 0.0–0.4)
Eos: 0 %
Hematocrit: 37.2 % — ABNORMAL LOW (ref 37.5–51.0)
Hemoglobin: 11.1 g/dL — ABNORMAL LOW (ref 13.0–17.7)
Immature Grans (Abs): 0 10*3/uL (ref 0.0–0.1)
Immature Granulocytes: 0 %
Lymphocytes Absolute: 1.9 10*3/uL (ref 0.7–3.1)
Lymphs: 27 %
MCH: 21.5 pg — ABNORMAL LOW (ref 26.6–33.0)
MCHC: 29.8 g/dL — ABNORMAL LOW (ref 31.5–35.7)
MCV: 72 fL — ABNORMAL LOW (ref 79–97)
Monocytes Absolute: 0.5 10*3/uL (ref 0.1–0.9)
Monocytes: 7 %
Neutrophils Absolute: 4.4 10*3/uL (ref 1.4–7.0)
Neutrophils: 66 %
Platelets: 238 10*3/uL (ref 150–450)
RBC: 5.16 x10E6/uL (ref 4.14–5.80)
RDW: 21.2 % — ABNORMAL HIGH (ref 11.6–15.4)
WBC: 6.8 10*3/uL (ref 3.4–10.8)

## 2020-07-20 LAB — IRON,TIBC AND FERRITIN PANEL
Ferritin: 32 ng/mL (ref 30–400)
Iron Saturation: 4 % — CL (ref 15–55)
Iron: 14 ug/dL — ABNORMAL LOW (ref 38–169)
Total Iron Binding Capacity: 344 ug/dL (ref 250–450)
UIBC: 330 ug/dL (ref 111–343)

## 2020-07-22 ENCOUNTER — Encounter: Payer: Self-pay | Admitting: Family Medicine

## 2020-07-22 DIAGNOSIS — R918 Other nonspecific abnormal finding of lung field: Secondary | ICD-10-CM | POA: Diagnosis not present

## 2020-07-22 DIAGNOSIS — R0602 Shortness of breath: Secondary | ICD-10-CM | POA: Diagnosis not present

## 2020-07-22 DIAGNOSIS — I517 Cardiomegaly: Secondary | ICD-10-CM | POA: Diagnosis not present

## 2020-07-26 ENCOUNTER — Other Ambulatory Visit: Payer: Self-pay | Admitting: Family Medicine

## 2020-07-26 ENCOUNTER — Other Ambulatory Visit: Payer: Self-pay

## 2020-07-26 MED ORDER — CEFDINIR 300 MG PO CAPS: 300.0000 mg | ORAL_CAPSULE | Freq: Two times a day (BID) | ORAL | 0 refills | Status: DC

## 2020-07-26 MED ORDER — AZITHROMYCIN 250 MG PO TABS: ORAL_TABLET | ORAL | 0 refills | Status: DC

## 2020-07-26 MED ORDER — ALBUTEROL SULFATE HFA 108 (90 BASE) MCG/ACT IN AERS: 2.0000 | INHALATION_SPRAY | Freq: Four times a day (QID) | RESPIRATORY_TRACT | 5 refills | Status: DC | PRN

## 2020-07-26 NOTE — Progress Notes (Signed)
z

## 2020-07-27 DIAGNOSIS — J449 Chronic obstructive pulmonary disease, unspecified: Secondary | ICD-10-CM | POA: Diagnosis not present

## 2020-07-30 DIAGNOSIS — M5106 Intervertebral disc disorders with myelopathy, lumbar region: Secondary | ICD-10-CM | POA: Diagnosis not present

## 2020-07-30 DIAGNOSIS — J441 Chronic obstructive pulmonary disease with (acute) exacerbation: Secondary | ICD-10-CM | POA: Diagnosis not present

## 2020-08-08 ENCOUNTER — Ambulatory Visit: Payer: Medicare Other | Admitting: Family Medicine

## 2020-08-08 ENCOUNTER — Other Ambulatory Visit: Payer: Self-pay | Admitting: Family Medicine

## 2020-08-08 ENCOUNTER — Other Ambulatory Visit: Payer: Self-pay | Admitting: Physician Assistant

## 2020-08-11 ENCOUNTER — Ambulatory Visit: Payer: Medicare Other | Admitting: Cardiology

## 2020-08-11 DIAGNOSIS — G629 Polyneuropathy, unspecified: Secondary | ICD-10-CM | POA: Insufficient documentation

## 2020-08-11 DIAGNOSIS — H269 Unspecified cataract: Secondary | ICD-10-CM | POA: Insufficient documentation

## 2020-08-11 DIAGNOSIS — J439 Emphysema, unspecified: Secondary | ICD-10-CM | POA: Insufficient documentation

## 2020-08-11 DIAGNOSIS — I509 Heart failure, unspecified: Secondary | ICD-10-CM | POA: Insufficient documentation

## 2020-08-11 DIAGNOSIS — G4730 Sleep apnea, unspecified: Secondary | ICD-10-CM | POA: Insufficient documentation

## 2020-08-11 DIAGNOSIS — K219 Gastro-esophageal reflux disease without esophagitis: Secondary | ICD-10-CM | POA: Insufficient documentation

## 2020-08-11 DIAGNOSIS — M4800 Spinal stenosis, site unspecified: Secondary | ICD-10-CM | POA: Insufficient documentation

## 2020-08-11 DIAGNOSIS — M1990 Unspecified osteoarthritis, unspecified site: Secondary | ICD-10-CM | POA: Insufficient documentation

## 2020-08-11 DIAGNOSIS — G2581 Restless legs syndrome: Secondary | ICD-10-CM | POA: Insufficient documentation

## 2020-08-11 DIAGNOSIS — E785 Hyperlipidemia, unspecified: Secondary | ICD-10-CM | POA: Insufficient documentation

## 2020-08-11 DIAGNOSIS — F419 Anxiety disorder, unspecified: Secondary | ICD-10-CM | POA: Insufficient documentation

## 2020-08-11 DIAGNOSIS — G4733 Obstructive sleep apnea (adult) (pediatric): Secondary | ICD-10-CM | POA: Insufficient documentation

## 2020-08-11 DIAGNOSIS — T7840XA Allergy, unspecified, initial encounter: Secondary | ICD-10-CM | POA: Insufficient documentation

## 2020-08-11 DIAGNOSIS — R7303 Prediabetes: Secondary | ICD-10-CM | POA: Insufficient documentation

## 2020-08-11 DIAGNOSIS — I10 Essential (primary) hypertension: Secondary | ICD-10-CM | POA: Insufficient documentation

## 2020-08-11 DIAGNOSIS — Z87442 Personal history of urinary calculi: Secondary | ICD-10-CM | POA: Insufficient documentation

## 2020-08-15 ENCOUNTER — Other Ambulatory Visit: Payer: Self-pay

## 2020-08-15 ENCOUNTER — Encounter: Payer: Self-pay | Admitting: Family Medicine

## 2020-08-15 ENCOUNTER — Ambulatory Visit (INDEPENDENT_AMBULATORY_CARE_PROVIDER_SITE_OTHER): Payer: Medicare Other | Admitting: Family Medicine

## 2020-08-15 VITALS — BP 140/100 | HR 104 | Temp 96.1°F | Resp 18 | Ht 71.0 in | Wt 279.0 lb

## 2020-08-15 DIAGNOSIS — Z23 Encounter for immunization: Secondary | ICD-10-CM | POA: Diagnosis not present

## 2020-08-15 DIAGNOSIS — I110 Hypertensive heart disease with heart failure: Secondary | ICD-10-CM

## 2020-08-15 DIAGNOSIS — K5903 Drug induced constipation: Secondary | ICD-10-CM

## 2020-08-15 DIAGNOSIS — G4733 Obstructive sleep apnea (adult) (pediatric): Secondary | ICD-10-CM

## 2020-08-15 DIAGNOSIS — M1A09X0 Idiopathic chronic gout, multiple sites, without tophus (tophi): Secondary | ICD-10-CM

## 2020-08-15 DIAGNOSIS — I10 Essential (primary) hypertension: Secondary | ICD-10-CM | POA: Diagnosis not present

## 2020-08-15 DIAGNOSIS — R7303 Prediabetes: Secondary | ICD-10-CM | POA: Diagnosis not present

## 2020-08-15 DIAGNOSIS — I5030 Unspecified diastolic (congestive) heart failure: Secondary | ICD-10-CM

## 2020-08-15 DIAGNOSIS — I2510 Atherosclerotic heart disease of native coronary artery without angina pectoris: Secondary | ICD-10-CM

## 2020-08-15 DIAGNOSIS — R251 Tremor, unspecified: Secondary | ICD-10-CM

## 2020-08-15 DIAGNOSIS — J418 Mixed simple and mucopurulent chronic bronchitis: Secondary | ICD-10-CM

## 2020-08-15 DIAGNOSIS — N138 Other obstructive and reflux uropathy: Secondary | ICD-10-CM

## 2020-08-15 DIAGNOSIS — N401 Enlarged prostate with lower urinary tract symptoms: Secondary | ICD-10-CM

## 2020-08-15 DIAGNOSIS — D508 Other iron deficiency anemias: Secondary | ICD-10-CM

## 2020-08-15 MED ORDER — ALLOPURINOL 300 MG PO TABS: 300.0000 mg | ORAL_TABLET | Freq: Every day | ORAL | 5 refills | Status: DC

## 2020-08-15 MED ORDER — BREZTRI AEROSPHERE 160-9-4.8 MCG/ACT IN AERO: 2.0000 | INHALATION_SPRAY | Freq: Two times a day (BID) | RESPIRATORY_TRACT | 2 refills | Status: DC

## 2020-08-15 MED ORDER — ALBUTEROL SULFATE HFA 108 (90 BASE) MCG/ACT IN AERS: 2.0000 | INHALATION_SPRAY | Freq: Four times a day (QID) | RESPIRATORY_TRACT | 5 refills | Status: DC | PRN

## 2020-08-15 NOTE — Progress Notes (Signed)
Subjective:  Patient ID: Shawn Osborn, male    DOB: 02-01-1951  Age: 69 y.o. MRN: [id number]  Chief Complaint  Patient presents with  . Hyperlipidemia    HPI Patient is in today for follow-up of insomnia. He is sleeping better.  He is taking hydroxyzine 50 mg once at night to help with sleep.  Patient has copd. Pt is using bretri 2 puffs twice a day. Also has ventolin.  Was consistent with iron deficiency anemia and has vitamin D deficiency. He is taking iron sulfate and vitamin D 50,000 units twice weekly.. . The patient was having severe constipation at his last visit. I recommended magnesium citrate half a bottle upon returning home.  If no BM in 4 hours take the second half of the bottle. Also recommended a senna suppository.  This did work. His BMs are better now.   Patient has prediabetes, hyperlipidemia, and hypertension.  The patient tries to eat healthy.  He does not exercise.  For his hypertension he is currently on propranolol 60 mg once daily extended release, which is also to help his tremor.  It does help some.  Patient is on Crestor 5 mg once daily for hyperlipidemia.  Patient also has idiopathic neuropathy for which she takes Lyrica 75 mg 3 times a day.  Patient has hypertensive CAD: LHC as follows.  Intermediate stenosis between 22 and 85% in the mid LAD.  Physiologic testing with DFR was negative for reduced flow with values of 0.9 and 0.91 (threshold for ischemia is 0.89 and below).  Widely patent left main  Widely patent LAD other than as noted above  Widely patent circumflex coronary artery  Luminal irregularities in a dominant right coronary.  Mid PDA contains an eccentric 70% stenosis.  Normal left ventricular wall motion with EF 50%.  EDP 22 mmHg. RECOMMENDATIONS: Aggressive preventive therapy given the patient's documented coronary artery disease. Anti-ischemic therapy as clinically indicated. If develops angina, the LAD could be treated relatively  safely.  Patient has significant gout.  Its been well controlled on allopurinol 300 mg once daily over the last 1 to 2 months.  He has colchicine to take for flareups.  Patient has mild major depression, recurrent.  He takes Zoloft 100 mg once daily.  Patient has had some issues with memory in the past.  It is unclear exactly the etiology of this.  He was started on Aricept 10 mg once daily which may have helped some his family believes.  Current Outpatient Medications on File Prior to Visit  Medication Sig Dispense Refill  . Ascorbic Acid (VITAMIN C) 500 MG CAPS Take 500 mg by mouth daily.    . colchicine 0.6 MG tablet Take 1 tablet (0.6 mg total) by mouth daily. (Patient taking differently: Take 0.6 mg by mouth daily as needed. ) 14 tablet 3  . docusate sodium (COLACE) 100 MG capsule Take 100 mg by mouth daily as needed for mild constipation.    Marland Kitchen donepezil (ARICEPT) 10 MG tablet TAKE ONE TABLET BY MOUTH AT BEDTIME 30 tablet 3  . furosemide (LASIX) 20 MG tablet Take 1 tablet (20 mg total) by mouth daily as needed. 30 tablet 3  . Glucosamine-Chondroit-Vit C-Mn (GLUCOSAMINE CHONDR 1500 COMPLX PO) Take by mouth daily.    . hydrOXYzine (ATARAX/VISTARIL) 50 MG tablet TAKE ONE TABLET BY MOUTH AT BEDTIME 30 tablet 2  . omeprazole (PRILOSEC) 40 MG capsule TAKE ONE CAPSULE BY MOUTH EVERY DAY 30 capsule 6  . oxyCODONE (ROXICODONE) 15  MG immediate release tablet TAKE ONE TABLET BY MOUTH 4 TIMES DAILY 120 tablet 0  . Potassium 99 MG TABS Take 297 mg by mouth daily as needed.     . pregabalin (LYRICA) 75 MG capsule TAKE ONE CAPSULE BY MOUTH EVERY DAY and TAKE ONE CAPSULE BY MOUTH EVERY DAY and TAKE ONE CAPSULE BY MOUTH AT BEDTIME 90 capsule 3  . propranolol ER (INDERAL LA) 60 MG 24 hr capsule TAKE ONE CAPSULE BY MOUTH EVERY DAY 30 capsule 2  . rosuvastatin (CRESTOR) 5 MG tablet TAKE ONE TABLET BY MOUTH AT BEDTIME 30 tablet 2  . sertraline (ZOLOFT) 100 MG tablet TAKE ONE TABLET BY MOUTH AT BEDTIME 30 tablet  3  . tamsulosin (FLOMAX) 0.4 MG CAPS capsule Take 1 capsule (0.4 mg total) by mouth daily after supper. 30 capsule 3  . Vitamin D, Ergocalciferol, (DRISDOL) 1.25 MG (50000 UNIT) CAPS capsule Take 1 capsules twice a week by mouth. 8 capsule 2   No current facility-administered medications on file prior to visit.   Past Medical History:  Diagnosis Date  . Acute combined systolic and diastolic CHF, NYHA class 3 ([place]) 10/08/2019  . Acute delirium 10/08/2019  . Acute exacerbation of CHF (congestive heart failure) ([place]) 10/08/2019  . Acute gout of multiple sites 07/01/2016  . Acute respiratory failure with hypoxia and hypercapnia ([place]) 10/08/2019  . Allergy   . Anxiety   . Arthritis    Gout, osteoarthritis in Back and Knees  . Cataract   . CHF (congestive heart failure) ([place])   . Chronic bronchitis   . Community acquired pneumonia 10/08/2019  . COPD (chronic obstructive pulmonary disease) ([place]) 10/08/2019  . Dementia ([place]) 10/08/2019  . Elevated troponin 10/08/2019  . Emphysema of lung ([place])   . Essential hypertension 07/03/2016  . GERD (gastroesophageal reflux disease)   . History of kidney stones   . HLD (hyperlipidemia) 10/08/2019  . Hypercapnic respiratory failure ([place]) 10/08/2019  . Hyperlipidemia   . Hypertension   . Hypertensive heart disease 10/08/2019  . Morbid obesity due to excess calories ([place]) 10/08/2019  . Narcotic overdose ([place]) 10/08/2019  . 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 NSTEMI (non-ST elevated myocardial infarction) ([place]) 10/08/2019  . Obstructive sleep apnea   . Occlusion and stenosis of carotid artery without mention of cerebral infarction 12/13/2011  . OSA on CPAP 10/08/2019  . Pain in joint of left shoulder 01/12/2019  . Paranoia (psychosis) ([place]) 07/01/2016  . Poorly-controlled hypertension 10/08/2019  . Prediabetes   . Psychotic disorder with delusions ([place]) 07/01/2016  . Respiratory failure with hypoxia and hypercapnia ([place])  10/08/2019  . Restless leg syndrome 07/03/2016  . Rheumatoid arthritis(714.0)   . RLS (restless legs syndrome)   . Sepsis ([place]) 10/08/2019  . Sleep apnea   . Spinal stenosis   . Syncope 04/03/2019  . Tear of left rotator cuff 01/12/2019  . Toxic metabolic encephalopathy 65/99/3570  . UTI (urinary tract infection) 10/08/2019  . Vascular dementia with behavioral disturbance ([place]) 07/06/2016   Past Surgical History:  Procedure Laterality Date  . APPENDECTOMY    . CARPAL TUNNEL RELEASE    . EYE SURGERY    . INTRAVASCULAR PRESSURE WIRE/FFR STUDY N/A 10/20/2019   Procedure: INTRAVASCULAR PRESSURE WIRE/FFR STUDY;  Surgeon: Belva Crome, MD;  Location: [facility];  Service: Cardiovascular;  Laterality: N/A;  . LEFT HEART CATH AND CORONARY ANGIOGRAPHY N/A 10/20/2019   Procedure: LEFT HEART CATH AND CORONARY ANGIOGRAPHY;  Surgeon: Belva Crome, MD;  Location: [facility];  Service: Cardiovascular;  Laterality: N/A;  . [place], 2003, 2012   Laminectomy X 3    Family History  Problem Relation Age of Onset  . Heart disease Mother   . Hypertension Mother    Social History   Socioeconomic History  . Marital status: Widowed    Spouse name: Not on file  . Number of children: 4  . Years of education: Not on file  . Highest education level: Not on file  Occupational History  . Not on file  Tobacco Use  . Smoking status: Current Every Day Smoker    Packs/day: 0.50    Years: 53.00    Pack years: 26.50    Last attempt to quit: 11/06/2018    Years since quitting: 1.7  . Smokeless tobacco: Current User    Types: Chew  Vaping Use  . Vaping Use: Never used  Substance and Sexual Activity  . Alcohol use: No  . Drug use: No  . Sexual activity: Not on file  Other Topics Concern  . Not on file  Social History Narrative  . Not on file   Social Determinants of Health   Financial Resource Strain:   . Difficulty of Paying Living Expenses: Not on file  Food  Insecurity: No Food Insecurity  . Worried About Charity fundraiser in the Last Year: Never true  . Ran Out of Food in the Last Year: Never true  Transportation Needs:   . Lack of Transportation (Medical): Not on file  . Lack of Transportation (Non-Medical): Not on file  Physical Activity:   . Days of Exercise per Week: Not on file  . Minutes of Exercise per Session: Not on file  Stress:   . Feeling of Stress : Not on file  Social Connections:   . Frequency of Communication with Friends and Family: Not on file  . Frequency of Social Gatherings with Friends and Family: Not on file  . Attends Religious Services: Not on file  . Active Member of Clubs or Organizations: Not on file  . Attends Archivist Meetings: Not on file  . Marital Status: Not on file    Review of Systems  Constitutional: Negative for chills, fatigue and fever.  HENT: Positive for congestion and rhinorrhea (zyrtec helps. ). Negative for ear pain and sore throat.   Respiratory: Positive for cough, shortness of breath and wheezing.   Cardiovascular: Negative for chest pain, palpitations and leg swelling.  Gastrointestinal: Negative for abdominal pain, constipation, diarrhea, nausea and vomiting.  Genitourinary: Positive for frequency and urgency. Negative for dysuria.  Musculoskeletal: Positive for back pain. Negative for arthralgias and myalgias. Joint swelling: right hip and left shoulder pain   Neurological: Positive for headaches (dull ). Negative for dizziness and light-headedness.  Psychiatric/Behavioral: Negative for dysphoric mood. The patient is not nervous/anxious.      Objective:  BP (!) 140/100   Pulse (!) 104   Temp (!) 96.1 F (35.6 C)   Resp 18   Ht 5\' 11"  (1.803 m)   Wt 279 lb (126.6 kg)   BMI 38.91 kg/m   BP/Weight 08/15/2020 07/19/2020 2/84/1324  Systolic BP 401 027 253  Diastolic BP 664 90 68  Wt. (Lbs) 279 284.6 293  BMI 38.91 39.69 40.87    Physical Exam Vitals reviewed.   Constitutional:      Appearance: Normal appearance.  Neck:     Vascular:  No carotid bruit.  Cardiovascular:     Rate and Rhythm: Normal rate and regular rhythm.     Pulses: Normal pulses.     Heart sounds: Normal heart sounds.  Pulmonary:     Effort: Pulmonary effort is normal.     Breath sounds: Normal breath sounds. No wheezing, rhonchi or rales.  Abdominal:     General: Bowel sounds are normal.     Palpations: Abdomen is soft.     Tenderness: There is no abdominal tenderness.  Musculoskeletal:        General: No swelling.     Right lower leg: No edema.     Left lower leg: No edema.  Neurological:     Mental Status: He is alert.  Psychiatric:        Mood and Affect: Mood normal.        Behavior: Behavior normal.     Diabetic Foot Exam - Simple   No data filed       Lab Results  Component Value Date   WBC 10.5 08/15/2020   HGB 11.9 (L) 08/15/2020   HCT 39.7 08/15/2020   PLT 299 08/15/2020   GLUCOSE 86 08/15/2020   CHOL 167 08/15/2020   TRIG 145 08/15/2020   HDL 42 08/15/2020   LDLCALC 99 08/15/2020   ALT 11 08/15/2020   AST 13 08/15/2020   NA 140 08/15/2020   K 6.0 (H) 08/15/2020   CL 100 08/15/2020   CREATININE 0.82 08/15/2020   BUN 16 08/15/2020   CO2 27 08/15/2020   TSH 2.360 05/02/2020   HGBA1C 5.5 08/15/2020      Assessment & Plan:   1. Hypertensive heart disease with diastolic heart failure ([place]) and atherosclerosis. Fairly Well controlled.  No changes to medicines.  Continue to work on eating a healthy diet and exercise.  Labs drawn today.  - CBC with Differential/Platelet - Comprehensive metabolic panel - Lipid panel  2. Obstructive sleep apnea Recommend wear cpap.  3. Prediabetes Recommend continue to work on eating healthy diet and exercise. - Hemoglobin A1c  4. Drug induced constipation  Greatly improved.   5. Need for immunization against influenza - Flu Vaccine QUAD High Dose(Fluad)  6. Tremor of both hands The current  medical regimen is effective;  continue present plan and medications.  7. Mixed simple and mucopurulent chronic bronchitis (HCC) The current medical regimen is effective;  Greatly improved. continue present plan and medications.  8. BPH with obstruction/lower urinary tract symptoms The current medical regimen is effective;  continue present plan and medications.  9. Other iron deficiency anemia  - CBC.  10. Gout  The current medical regimen is effective;  continue present plan and medications.  Meds ordered this encounter  Medications  . Budeson-Glycopyrrol-Formoterol (BREZTRI AEROSPHERE) 160-9-4.8 MCG/ACT AERO    Sig: Inhale 2 puffs into the lungs 2 (two) times daily.    Dispense:  10.7 g    Refill:  2  . allopurinol (ZYLOPRIM) 300 MG tablet    Sig: Take 1 tablet (300 mg total) by mouth daily.    Dispense:  30 tablet    Refill:  5  . DISCONTD: albuterol (VENTOLIN HFA) 108 (90 Base) MCG/ACT inhaler    Sig: Inhale 2 puffs into the lungs every 6 (six) hours as needed for wheezing or shortness of breath.    Dispense:  18 g    Refill:  5    Orders Placed This Encounter  Procedures  . Flu Vaccine QUAD High  Dose(Fluad)  . CBC with Differential/Platelet  . Comprehensive metabolic panel  . Lipid panel  . Hemoglobin A1c  . Cardiovascular Risk Assessment     Follow-up: No follow-ups on file.  An After Visit Summary was printed and given to the patient.  Rochel Brome [facility] [phone]

## 2020-08-17 ENCOUNTER — Other Ambulatory Visit: Payer: Self-pay | Admitting: Family Medicine

## 2020-08-17 LAB — COMPREHENSIVE METABOLIC PANEL
ALT: 11 IU/L (ref 0–44)
AST: 13 IU/L (ref 0–40)
Albumin/Globulin Ratio: 1.2 (ref 1.2–2.2)
Albumin: 3.7 g/dL — ABNORMAL LOW (ref 3.8–4.8)
Alkaline Phosphatase: 84 IU/L (ref 44–121)
BUN/Creatinine Ratio: 20 (ref 10–24)
BUN: 16 mg/dL (ref 8–27)
Bilirubin Total: <0.2 mg/dL (ref 0.0–1.2)
CO2: 27 mmol/L (ref 20–29)
Calcium: 9.4 mg/dL (ref 8.6–10.2)
Chloride: 100 mmol/L (ref 96–106)
Creatinine, Ser: 0.82 mg/dL (ref 0.76–1.27)
GFR calc Af Amer: 104 mL/min/{1.73_m2} (ref >59)
GFR calc non Af Amer: 90 mL/min/{1.73_m2} (ref >59)
Globulin, Total: 3.2 g/dL (ref 1.5–4.5)
Glucose: 86 mg/dL (ref 65–99)
Potassium: 6 mmol/L — ABNORMAL HIGH (ref 3.5–5.2)
Sodium: 140 mmol/L (ref 134–144)
Total Protein: 6.9 g/dL (ref 6.0–8.5)

## 2020-08-17 LAB — CARDIOVASCULAR RISK ASSESSMENT

## 2020-08-17 LAB — CBC WITH DIFFERENTIAL/PLATELET
Basophils Absolute: 0.1 10*3/uL (ref 0.0–0.2)
Basos: 1 %
EOS (ABSOLUTE): 0.2 10*3/uL (ref 0.0–0.4)
Eos: 2 %
Hematocrit: 39.7 % (ref 37.5–51.0)
Hemoglobin: 11.9 g/dL — ABNORMAL LOW (ref 13.0–17.7)
Immature Grans (Abs): 0 10*3/uL (ref 0.0–0.1)
Immature Granulocytes: 0 %
Lymphocytes Absolute: 2.9 10*3/uL (ref 0.7–3.1)
Lymphs: 28 %
MCH: 22.5 pg — ABNORMAL LOW (ref 26.6–33.0)
MCHC: 30 g/dL — ABNORMAL LOW (ref 31.5–35.7)
MCV: 75 fL — ABNORMAL LOW (ref 79–97)
Monocytes Absolute: 0.7 10*3/uL (ref 0.1–0.9)
Monocytes: 7 %
Neutrophils Absolute: 6.6 10*3/uL (ref 1.4–7.0)
Neutrophils: 62 %
Platelets: 299 10*3/uL (ref 150–450)
RBC: 5.28 x10E6/uL (ref 4.14–5.80)
RDW: 21.1 % — ABNORMAL HIGH (ref 11.6–15.4)
WBC: 10.5 10*3/uL (ref 3.4–10.8)

## 2020-08-17 LAB — LIPID PANEL
Chol/HDL Ratio: 4 ratio (ref 0.0–5.0)
Cholesterol, Total: 167 mg/dL (ref 100–199)
HDL: 42 mg/dL (ref >39)
LDL Chol Calc (NIH): 99 mg/dL (ref 0–99)
Triglycerides: 145 mg/dL (ref 0–149)
VLDL Cholesterol Cal: 26 mg/dL (ref 5–40)

## 2020-08-17 LAB — HEMOGLOBIN A1C
Est. average glucose Bld gHb Est-mCnc: 111 mg/dL
Hgb A1c MFr Bld: 5.5 % (ref 4.8–5.6)

## 2020-08-18 ENCOUNTER — Other Ambulatory Visit: Payer: Medicare Other

## 2020-08-18 ENCOUNTER — Other Ambulatory Visit: Payer: Self-pay

## 2020-08-18 MED ORDER — ALBUTEROL SULFATE HFA 108 (90 BASE) MCG/ACT IN AERS: 2.0000 | INHALATION_SPRAY | Freq: Four times a day (QID) | RESPIRATORY_TRACT | 5 refills | Status: DC | PRN

## 2020-08-21 ENCOUNTER — Encounter: Payer: Self-pay | Admitting: Family Medicine

## 2020-08-22 ENCOUNTER — Other Ambulatory Visit: Payer: Self-pay

## 2020-08-22 ENCOUNTER — Other Ambulatory Visit: Payer: Medicare Other

## 2020-08-22 DIAGNOSIS — E875 Hyperkalemia: Secondary | ICD-10-CM

## 2020-08-22 LAB — POTASSIUM: Potassium: 4.9 mmol/L (ref 3.5–5.2)

## 2020-08-26 DIAGNOSIS — J449 Chronic obstructive pulmonary disease, unspecified: Secondary | ICD-10-CM | POA: Diagnosis not present

## 2020-08-29 DIAGNOSIS — M5106 Intervertebral disc disorders with myelopathy, lumbar region: Secondary | ICD-10-CM | POA: Diagnosis not present

## 2020-08-29 DIAGNOSIS — J441 Chronic obstructive pulmonary disease with (acute) exacerbation: Secondary | ICD-10-CM | POA: Diagnosis not present

## 2020-08-30 ENCOUNTER — Observation Stay (HOSPITAL_COMMUNITY)
Admission: EM | Admit: 2020-08-30 | Discharge: 2020-09-01 | Disposition: A | Discharge status: Home / Self Care | Payer: Medicare Other | Attending: Family Medicine | Admitting: Family Medicine

## 2020-08-30 ENCOUNTER — Emergency Department (HOSPITAL_COMMUNITY): Payer: Medicare Other

## 2020-08-30 DIAGNOSIS — I700 Atherosclerosis of aorta: Secondary | ICD-10-CM | POA: Diagnosis not present

## 2020-08-30 DIAGNOSIS — Z79899 Other long term (current) drug therapy: Secondary | ICD-10-CM | POA: Insufficient documentation

## 2020-08-30 DIAGNOSIS — I110 Hypertensive heart disease with heart failure: Secondary | ICD-10-CM | POA: Insufficient documentation

## 2020-08-30 DIAGNOSIS — R079 Chest pain, unspecified: Secondary | ICD-10-CM | POA: Diagnosis not present

## 2020-08-30 DIAGNOSIS — I6782 Cerebral ischemia: Secondary | ICD-10-CM | POA: Diagnosis not present

## 2020-08-30 DIAGNOSIS — F17210 Nicotine dependence, cigarettes, uncomplicated: Secondary | ICD-10-CM | POA: Insufficient documentation

## 2020-08-30 DIAGNOSIS — R21 Rash and other nonspecific skin eruption: Secondary | ICD-10-CM | POA: Diagnosis not present

## 2020-08-30 DIAGNOSIS — R109 Unspecified abdominal pain: Secondary | ICD-10-CM | POA: Diagnosis not present

## 2020-08-30 DIAGNOSIS — I517 Cardiomegaly: Secondary | ICD-10-CM | POA: Diagnosis not present

## 2020-08-30 DIAGNOSIS — F17220 Nicotine dependence, chewing tobacco, uncomplicated: Secondary | ICD-10-CM | POA: Insufficient documentation

## 2020-08-30 DIAGNOSIS — U071 COVID-19: Secondary | ICD-10-CM | POA: Insufficient documentation

## 2020-08-30 DIAGNOSIS — J449 Chronic obstructive pulmonary disease, unspecified: Secondary | ICD-10-CM | POA: Insufficient documentation

## 2020-08-30 DIAGNOSIS — F0391 Unspecified dementia with behavioral disturbance: Secondary | ICD-10-CM | POA: Diagnosis not present

## 2020-08-30 DIAGNOSIS — I2510 Atherosclerotic heart disease of native coronary artery without angina pectoris: Secondary | ICD-10-CM | POA: Diagnosis not present

## 2020-08-30 DIAGNOSIS — I739 Peripheral vascular disease, unspecified: Secondary | ICD-10-CM | POA: Diagnosis not present

## 2020-08-30 DIAGNOSIS — Z743 Need for continuous supervision: Secondary | ICD-10-CM | POA: Diagnosis not present

## 2020-08-30 DIAGNOSIS — S301XXA Contusion of abdominal wall, initial encounter: Secondary | ICD-10-CM | POA: Diagnosis not present

## 2020-08-30 DIAGNOSIS — R58 Hemorrhage, not elsewhere classified: Secondary | ICD-10-CM | POA: Diagnosis not present

## 2020-08-30 DIAGNOSIS — R0789 Other chest pain: Secondary | ICD-10-CM | POA: Diagnosis not present

## 2020-08-30 DIAGNOSIS — I5041 Acute combined systolic (congestive) and diastolic (congestive) heart failure: Secondary | ICD-10-CM | POA: Insufficient documentation

## 2020-08-30 DIAGNOSIS — G309 Alzheimer's disease, unspecified: Secondary | ICD-10-CM | POA: Diagnosis not present

## 2020-08-30 DIAGNOSIS — S299XXA Unspecified injury of thorax, initial encounter: Secondary | ICD-10-CM | POA: Diagnosis not present

## 2020-08-30 DIAGNOSIS — I25118 Atherosclerotic heart disease of native coronary artery with other forms of angina pectoris: Secondary | ICD-10-CM | POA: Diagnosis not present

## 2020-08-30 DIAGNOSIS — M542 Cervicalgia: Secondary | ICD-10-CM | POA: Diagnosis not present

## 2020-08-30 DIAGNOSIS — Z23 Encounter for immunization: Principal | ICD-10-CM | POA: Insufficient documentation

## 2020-08-30 DIAGNOSIS — Z041 Encounter for examination and observation following transport accident: Secondary | ICD-10-CM | POA: Diagnosis not present

## 2020-08-30 LAB — COMPREHENSIVE METABOLIC PANEL
ALT: 15 U/L (ref 0–44)
AST: 22 U/L (ref 15–41)
Albumin: 3.4 g/dL — ABNORMAL LOW (ref 3.5–5.0)
Alkaline Phosphatase: 60 U/L (ref 38–126)
Anion gap: 10 (ref 5–15)
BUN: 14 mg/dL (ref 8–23)
CO2: 28 mmol/L (ref 22–32)
Calcium: 8.5 mg/dL — ABNORMAL LOW (ref 8.9–10.3)
Chloride: 101 mmol/L (ref 98–111)
Creatinine, Ser: 1.24 mg/dL (ref 0.61–1.24)
GFR, Estimated: 59 mL/min — ABNORMAL LOW (ref >60)
Glucose, Bld: 82 mg/dL (ref 70–99)
Potassium: 4.6 mmol/L (ref 3.5–5.1)
Sodium: 139 mmol/L (ref 135–145)
Total Bilirubin: <0.1 mg/dL — ABNORMAL LOW (ref 0.3–1.2)
Total Protein: 6.9 g/dL (ref 6.5–8.1)

## 2020-08-30 LAB — CBC
HCT: 41.6 % (ref 39.0–52.0)
Hemoglobin: 11.2 g/dL — ABNORMAL LOW (ref 13.0–17.0)
MCH: 22 pg — ABNORMAL LOW (ref 26.0–34.0)
MCHC: 26.9 g/dL — ABNORMAL LOW (ref 30.0–36.0)
MCV: 81.6 fL (ref 80.0–100.0)
Platelets: 343 K/uL (ref 150–400)
RBC: 5.1 MIL/uL (ref 4.22–5.81)
RDW: 22.9 % — ABNORMAL HIGH (ref 11.5–15.5)
WBC: 16 K/uL — ABNORMAL HIGH (ref 4.0–10.5)
nRBC: 0 % (ref 0.0–0.2)

## 2020-08-30 LAB — LACTIC ACID, PLASMA: Lactic Acid, Venous: 1.2 mmol/L (ref 0.5–1.9)

## 2020-08-30 LAB — ETHANOL: Alcohol, Ethyl (B): <10 mg/dL (ref <10)

## 2020-08-30 LAB — PROTIME-INR
INR: 0.9 (ref 0.8–1.2)
Prothrombin Time: 12.2 seconds (ref 11.4–15.2)

## 2020-08-30 MED ORDER — FENTANYL CITRATE (PF) 100 MCG/2ML IJ SOLN
50.0000 ug | Freq: Once | INTRAMUSCULAR | Status: AC
  Administered 2020-08-30: 50 ug via INTRAVENOUS
  Filled 2020-08-30: qty 2

## 2020-08-30 MED ORDER — IOHEXOL 300 MG/ML  SOLN
100.0000 mL | Freq: Once | INTRAMUSCULAR | Status: AC | PRN
  Administered 2020-08-30: 100 mL via INTRAVENOUS

## 2020-08-30 MED ORDER — AEROCHAMBER PLUS FLO-VU LARGE MISC: 1.0000 | Freq: Once | Status: DC

## 2020-08-30 MED ORDER — ALBUTEROL SULFATE HFA 108 (90 BASE) MCG/ACT IN AERS
8.0000 | INHALATION_SPRAY | RESPIRATORY_TRACT | Status: DC | PRN
  Administered 2020-08-30: 8 via RESPIRATORY_TRACT
  Filled 2020-08-30: qty 6.7

## 2020-08-30 MED ORDER — ONDANSETRON HCL 4 MG/2ML IJ SOLN
4.0000 mg | Freq: Once | INTRAMUSCULAR | Status: AC
  Administered 2020-08-30: 4 mg via INTRAVENOUS
  Filled 2020-08-30: qty 2
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
## 2020-08-31 ENCOUNTER — Other Ambulatory Visit: Payer: Self-pay

## 2020-08-31 ENCOUNTER — Encounter (HOSPITAL_COMMUNITY): Payer: Self-pay | Admitting: Family Medicine

## 2020-08-31 DIAGNOSIS — I2510 Atherosclerotic heart disease of native coronary artery without angina pectoris: Secondary | ICD-10-CM | POA: Diagnosis not present

## 2020-08-31 DIAGNOSIS — U071 COVID-19: Secondary | ICD-10-CM | POA: Diagnosis not present

## 2020-08-31 DIAGNOSIS — J449 Chronic obstructive pulmonary disease, unspecified: Secondary | ICD-10-CM

## 2020-08-31 DIAGNOSIS — I739 Peripheral vascular disease, unspecified: Secondary | ICD-10-CM | POA: Diagnosis not present

## 2020-08-31 DIAGNOSIS — I700 Atherosclerosis of aorta: Secondary | ICD-10-CM | POA: Diagnosis not present

## 2020-08-31 DIAGNOSIS — I6782 Cerebral ischemia: Secondary | ICD-10-CM | POA: Diagnosis not present

## 2020-08-31 DIAGNOSIS — M5459 Other low back pain: Secondary | ICD-10-CM | POA: Diagnosis not present

## 2020-08-31 DIAGNOSIS — S301XXA Contusion of abdominal wall, initial encounter: Secondary | ICD-10-CM | POA: Diagnosis not present

## 2020-08-31 DIAGNOSIS — S299XXA Unspecified injury of thorax, initial encounter: Secondary | ICD-10-CM | POA: Diagnosis not present

## 2020-08-31 DIAGNOSIS — M542 Cervicalgia: Secondary | ICD-10-CM | POA: Diagnosis not present

## 2020-08-31 LAB — COMPREHENSIVE METABOLIC PANEL
ALT: 18 U/L (ref 0–44)
AST: 22 U/L (ref 15–41)
Albumin: 3 g/dL — ABNORMAL LOW (ref 3.5–5.0)
Alkaline Phosphatase: 54 U/L (ref 38–126)
Anion gap: 9 (ref 5–15)
BUN: 17 mg/dL (ref 8–23)
CO2: 28 mmol/L (ref 22–32)
Calcium: 8.2 mg/dL — ABNORMAL LOW (ref 8.9–10.3)
Chloride: 102 mmol/L (ref 98–111)
Creatinine, Ser: 0.95 mg/dL (ref 0.61–1.24)
GFR, Estimated: >60 mL/min (ref >60)
Glucose, Bld: 101 mg/dL — ABNORMAL HIGH (ref 70–99)
Potassium: 4.8 mmol/L (ref 3.5–5.1)
Sodium: 139 mmol/L (ref 135–145)
Total Bilirubin: 0.3 mg/dL (ref 0.3–1.2)
Total Protein: 6.3 g/dL — ABNORMAL LOW (ref 6.5–8.1)

## 2020-08-31 LAB — CK: Total CK: 282 U/L (ref 49–397)

## 2020-08-31 LAB — CBC
HCT: 39.4 % (ref 39.0–52.0)
Hemoglobin: 10.6 g/dL — ABNORMAL LOW (ref 13.0–17.0)
MCH: 22.1 pg — ABNORMAL LOW (ref 26.0–34.0)
MCHC: 26.9 g/dL — ABNORMAL LOW (ref 30.0–36.0)
MCV: 82.1 fL (ref 80.0–100.0)
Platelets: 303 10*3/uL (ref 150–400)
RBC: 4.8 MIL/uL (ref 4.22–5.81)
RDW: 22.5 % — ABNORMAL HIGH (ref 11.5–15.5)
WBC: 15.2 10*3/uL — ABNORMAL HIGH (ref 4.0–10.5)
nRBC: 0 % (ref 0.0–0.2)

## 2020-08-31 LAB — SAMPLE TO BLOOD BANK

## 2020-08-31 LAB — RESPIRATORY PANEL BY RT PCR (FLU A&B, COVID)
Influenza A by PCR: NEGATIVE
Influenza B by PCR: NEGATIVE
SARS Coronavirus 2 by RT PCR: POSITIVE — AB

## 2020-08-31 MED ORDER — SERTRALINE HCL 100 MG PO TABS
100.0000 mg | ORAL_TABLET | Freq: Every day | ORAL | Status: DC
  Administered 2020-08-31: 100 mg via ORAL
  Filled 2020-08-31: qty 1

## 2020-08-31 MED ORDER — POLYETHYLENE GLYCOL 3350 17 G PO PACK
17.0000 g | PACK | Freq: Two times a day (BID) | ORAL | Status: DC
  Administered 2020-08-31 – 2020-09-01 (×3): 17 g via ORAL
  Filled 2020-08-31 (×3): qty 1

## 2020-08-31 MED ORDER — PROPRANOLOL HCL ER 60 MG PO CP24
60.0000 mg | ORAL_CAPSULE | Freq: Every day | ORAL | Status: DC
  Administered 2020-08-31 – 2020-09-01 (×2): 60 mg via ORAL
  Filled 2020-08-31 (×3): qty 1

## 2020-08-31 MED ORDER — ROSUVASTATIN CALCIUM 5 MG PO TABS
5.0000 mg | ORAL_TABLET | Freq: Every day | ORAL | Status: DC
  Administered 2020-08-31: 5 mg via ORAL
  Filled 2020-08-31: qty 1

## 2020-08-31 MED ORDER — TAMSULOSIN HCL 0.4 MG PO CAPS
0.4000 mg | ORAL_CAPSULE | Freq: Every day | ORAL | Status: DC
  Administered 2020-08-31: 0.4 mg via ORAL
  Filled 2020-08-31: qty 1

## 2020-08-31 MED ORDER — PREGABALIN 75 MG PO CAPS
75.0000 mg | ORAL_CAPSULE | Freq: Two times a day (BID) | ORAL | Status: DC
  Administered 2020-08-31 – 2020-09-01 (×2): 75 mg via ORAL
  Filled 2020-08-31 (×2): qty 1

## 2020-08-31 MED ORDER — NICOTINE 21 MG/24HR TD PT24
21.0000 mg | MEDICATED_PATCH | Freq: Every day | TRANSDERMAL | Status: DC
  Administered 2020-08-31 – 2020-09-01 (×2): 21 mg via TRANSDERMAL
  Filled 2020-08-31 (×2): qty 1

## 2020-08-31 MED ORDER — DONEPEZIL HCL 10 MG PO TABS
10.0000 mg | ORAL_TABLET | Freq: Every day | ORAL | Status: DC
  Administered 2020-08-31: 10 mg via ORAL
  Filled 2020-08-31: qty 1

## 2020-08-31 MED ORDER — FUROSEMIDE 20 MG PO TABS
20.0000 mg | ORAL_TABLET | Freq: Every day | ORAL | Status: DC
  Administered 2020-08-31 – 2020-09-01 (×2): 20 mg via ORAL
  Filled 2020-08-31 (×2): qty 1

## 2020-08-31 MED ORDER — KETOROLAC TROMETHAMINE 30 MG/ML IJ SOLN
15.0000 mg | Freq: Once | INTRAMUSCULAR | Status: AC
  Administered 2020-08-31: 15 mg via INTRAVENOUS
  Filled 2020-08-31: qty 1

## 2020-08-31 MED ORDER — HYDROMORPHONE HCL 1 MG/ML IJ SOLN: 1.0000 mg | INTRAMUSCULAR | Status: DC | PRN

## 2020-08-31 MED ORDER — OXYCODONE HCL 5 MG PO TABS
15.0000 mg | ORAL_TABLET | Freq: Four times a day (QID) | ORAL | Status: DC
  Administered 2020-08-31 – 2020-09-01 (×4): 15 mg via ORAL
  Filled 2020-08-31 (×4): qty 3

## 2020-08-31 MED ORDER — PANTOPRAZOLE SODIUM 40 MG PO TBEC
40.0000 mg | DELAYED_RELEASE_TABLET | Freq: Every day | ORAL | Status: DC
  Administered 2020-08-31 – 2020-09-01 (×2): 40 mg via ORAL
  Filled 2020-08-31 (×2): qty 1

## 2020-08-31 MED ORDER — BUDESON-GLYCOPYRROL-FORMOTEROL 160-9-4.8 MCG/ACT IN AERO: 2.0000 | INHALATION_SPRAY | Freq: Two times a day (BID) | RESPIRATORY_TRACT | Status: DC

## 2020-08-31 MED ORDER — OXYCODONE HCL 5 MG PO TABS
15.0000 mg | ORAL_TABLET | Freq: Once | ORAL | Status: AC
  Administered 2020-08-31: 15 mg via ORAL
  Filled 2020-08-31: qty 3

## 2020-08-31 MED ORDER — FENTANYL CITRATE (PF) 100 MCG/2ML IJ SOLN
50.0000 ug | Freq: Once | INTRAMUSCULAR | Status: AC
  Administered 2020-08-31: 50 ug via INTRAVENOUS
  Filled 2020-08-31: qty 2

## 2020-08-31 MED ORDER — ENOXAPARIN SODIUM 80 MG/0.8ML ~~LOC~~ SOLN
70.0000 mg | Freq: Every day | SUBCUTANEOUS | Status: DC
  Administered 2020-09-01: 70 mg via SUBCUTANEOUS
  Filled 2020-08-31: qty 0.8

## 2020-08-31 MED ORDER — ACETAMINOPHEN 500 MG PO TABS
1000.0000 mg | ORAL_TABLET | Freq: Four times a day (QID) | ORAL | Status: DC
  Administered 2020-08-31 – 2020-09-01 (×5): 1000 mg via ORAL
  Filled 2020-08-31 (×5): qty 2

## 2020-08-31 MED ORDER — ALLOPURINOL 300 MG PO TABS
300.0000 mg | ORAL_TABLET | Freq: Every day | ORAL | Status: DC
  Administered 2020-08-31 – 2020-09-01 (×2): 300 mg via ORAL
  Filled 2020-08-31 (×3): qty 1

## 2020-08-31 MED ORDER — PREGABALIN 75 MG PO CAPS: 75.0000 mg | ORAL_CAPSULE | Freq: Three times a day (TID) | ORAL | Status: DC

## 2020-08-31 MED ORDER — DICLOFENAC SODIUM 1 % EX GEL
2.0000 g | Freq: Four times a day (QID) | CUTANEOUS | Status: DC
  Administered 2020-08-31 – 2020-09-01 (×4): 2 g via TOPICAL
  Filled 2020-08-31 (×2): qty 100

## 2020-08-31 MED ORDER — ACETAMINOPHEN 325 MG PO TABS: 650.0000 mg | ORAL_TABLET | Freq: Four times a day (QID) | ORAL | Status: DC

## 2020-08-31 MED ORDER — FLUTICASONE FUROATE-VILANTEROL 100-25 MCG/INH IN AEPB
1.0000 | INHALATION_SPRAY | Freq: Every day | RESPIRATORY_TRACT | Status: DC
  Administered 2020-08-31 – 2020-09-01 (×2): 1 via RESPIRATORY_TRACT
  Filled 2020-08-31 (×2): qty 28

## 2020-08-31 MED ORDER — IBUPROFEN 600 MG PO TABS: 600.0000 mg | ORAL_TABLET | Freq: Three times a day (TID) | ORAL | Status: DC

## 2020-08-31 MED ORDER — HYDROMORPHONE HCL 1 MG/ML IJ SOLN
1.0000 mg | Freq: Once | INTRAMUSCULAR | Status: AC
  Administered 2020-08-31: 1 mg via INTRAVENOUS
  Filled 2020-08-31: qty 1

## 2020-08-31 MED ORDER — LIDOCAINE 5 % EX PTCH
1.0000 | MEDICATED_PATCH | CUTANEOUS | Status: DC
  Administered 2020-08-31 – 2020-09-01 (×2): 1 via TRANSDERMAL
  Filled 2020-08-31 (×2): qty 1

## 2020-08-31 MED ORDER — ALBUTEROL SULFATE (2.5 MG/3ML) 0.083% IN NEBU: 2.5000 mg | INHALATION_SOLUTION | Freq: Four times a day (QID) | RESPIRATORY_TRACT | Status: DC | PRN

## 2020-08-31 MED ORDER — UMECLIDINIUM BROMIDE 62.5 MCG/INH IN AEPB
1.0000 | INHALATION_SPRAY | Freq: Every day | RESPIRATORY_TRACT | Status: DC
  Administered 2020-08-31 – 2020-09-01 (×2): 1 via RESPIRATORY_TRACT
  Filled 2020-08-31 (×2): qty 7

## 2020-08-31 MED ORDER — ENOXAPARIN SODIUM 40 MG/0.4ML ~~LOC~~ SOLN
40.0000 mg | Freq: Every day | SUBCUTANEOUS | Status: DC
  Administered 2020-08-31: 40 mg via SUBCUTANEOUS
  Filled 2020-08-31: qty 0.4
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
## 2020-08-31 NOTE — ED Notes (Signed)
Attempted to ambulate pt but he is in so much pain that he is not able to get up out the bed. In the process of changing him and getting him dried he was crying because of all the pain that he is in. Ambulate attempt failed.
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
## 2020-08-31 NOTE — Progress Notes (Signed)
Patient received to unit, did not finalize admission as patient came up Positive for COVID; currently in the process of transffering patient to adequate unit.   Isolation precautions in place.
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
## 2020-08-31 NOTE — ED Notes (Signed)
Ordered breakfast--Shawn Osborn 

## 2020-09-01 DIAGNOSIS — U071 COVID-19: Secondary | ICD-10-CM

## 2020-09-01 DIAGNOSIS — Z23 Encounter for immunization: Secondary | ICD-10-CM | POA: Diagnosis not present

## 2020-09-01 DIAGNOSIS — R079 Chest pain, unspecified: Secondary | ICD-10-CM | POA: Diagnosis present

## 2020-09-01 DIAGNOSIS — J449 Chronic obstructive pulmonary disease, unspecified: Secondary | ICD-10-CM | POA: Diagnosis not present

## 2020-09-01 LAB — CBC
HCT: 34.7 % — ABNORMAL LOW (ref 39.0–52.0)
Hemoglobin: 9.8 g/dL — ABNORMAL LOW (ref 13.0–17.0)
MCH: 22 pg — ABNORMAL LOW (ref 26.0–34.0)
MCHC: 28.2 g/dL — ABNORMAL LOW (ref 30.0–36.0)
MCV: 77.8 fL — ABNORMAL LOW (ref 80.0–100.0)
Platelets: 294 10*3/uL (ref 150–400)
RBC: 4.46 MIL/uL (ref 4.22–5.81)
RDW: 22.1 % — ABNORMAL HIGH (ref 11.5–15.5)
WBC: 10.7 10*3/uL — ABNORMAL HIGH (ref 4.0–10.5)
nRBC: 0 % (ref 0.0–0.2)

## 2020-09-01 LAB — COMPREHENSIVE METABOLIC PANEL
ALT: 15 U/L (ref 0–44)
AST: 17 U/L (ref 15–41)
Albumin: 2.9 g/dL — ABNORMAL LOW (ref 3.5–5.0)
Alkaline Phosphatase: 50 U/L (ref 38–126)
Anion gap: 11 (ref 5–15)
BUN: 18 mg/dL (ref 8–23)
CO2: 28 mmol/L (ref 22–32)
Calcium: 8.5 mg/dL — ABNORMAL LOW (ref 8.9–10.3)
Chloride: 100 mmol/L (ref 98–111)
Creatinine, Ser: 0.73 mg/dL (ref 0.61–1.24)
GFR, Estimated: >60 mL/min (ref >60)
Glucose, Bld: 86 mg/dL (ref 70–99)
Potassium: 4 mmol/L (ref 3.5–5.1)
Sodium: 139 mmol/L (ref 135–145)
Total Bilirubin: 0.6 mg/dL (ref 0.3–1.2)
Total Protein: 6.1 g/dL — ABNORMAL LOW (ref 6.5–8.1)

## 2020-09-01 LAB — HIV ANTIBODY (ROUTINE TESTING W REFLEX): HIV Screen 4th Generation wRfx: NONREACTIVE

## 2020-09-01 MED ORDER — DICLOFENAC SODIUM 1 % EX GEL: 2.0000 g | Freq: Four times a day (QID) | CUTANEOUS | 0 refills | Status: DC | PRN

## 2020-09-01 MED ORDER — METHYLPREDNISOLONE SODIUM SUCC 125 MG IJ SOLR: 125.0000 mg | Freq: Once | INTRAMUSCULAR | Status: DC | PRN

## 2020-09-01 MED ORDER — FAMOTIDINE IN NACL 20-0.9 MG/50ML-% IV SOLN
20.0000 mg | Freq: Once | INTRAVENOUS | Status: DC | PRN
  Filled 2020-09-01: qty 50

## 2020-09-01 MED ORDER — OXYCODONE HCL 5 MG PO TABS: 15.0000 mg | ORAL_TABLET | Freq: Four times a day (QID) | ORAL | Status: DC | PRN

## 2020-09-01 MED ORDER — ALBUTEROL SULFATE HFA 108 (90 BASE) MCG/ACT IN AERS
2.0000 | INHALATION_SPRAY | Freq: Once | RESPIRATORY_TRACT | Status: DC | PRN
  Filled 2020-09-01: qty 6.7

## 2020-09-01 MED ORDER — SODIUM CHLORIDE 0.9 % IV SOLN: INTRAVENOUS | Status: DC | PRN

## 2020-09-01 MED ORDER — LIDOCAINE 5 % EX PTCH: 1.0000 | MEDICATED_PATCH | CUTANEOUS | 0 refills | Status: DC

## 2020-09-01 MED ORDER — DIPHENHYDRAMINE HCL 50 MG/ML IJ SOLN: 50.0000 mg | Freq: Once | INTRAMUSCULAR | Status: DC | PRN

## 2020-09-01 MED ORDER — EPINEPHRINE 0.3 MG/0.3ML IJ SOAJ
0.3000 mg | Freq: Once | INTRAMUSCULAR | Status: DC | PRN
  Filled 2020-09-01 (×2): qty 0.6

## 2020-09-01 MED ORDER — OXYCODONE HCL 5 MG PO TABS
15.0000 mg | ORAL_TABLET | Freq: Four times a day (QID) | ORAL | Status: DC
  Administered 2020-09-01: 15 mg via ORAL
  Filled 2020-09-01: qty 3

## 2020-09-01 MED ORDER — SODIUM CHLORIDE 0.9 % IV SOLN
Freq: Once | INTRAVENOUS | Status: AC
  Filled 2020-09-01: qty 20
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
## 2020-09-01 NOTE — Progress Notes (Signed)
   09/01/20 1446  Vitals  Temp 97.7 F (36.5 C)  Temp Source Oral  BP (!) 120/54  MAP (mmHg) 69  BP Location Left Leg  BP Method Automatic  Patient Position (if appropriate) Lying  Pulse Rate (!) 59  Pulse Rate Source Monitor  ECG Heart Rate (!) 59  Level of Consciousness  Level of Consciousness Alert  MEWS COLOR  MEWS Score Color Green  Oxygen Therapy  SpO2 96 %  O2 Device Room Air  Patient Activity (if Appropriate) In bed  Pulse Oximetry Type Continuous  MEWS Score  MEWS Temp 0  MEWS Systolic 0  MEWS Pulse 0  MEWS RR 0  MEWS LOC 0  MEWS Score 0
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
## 2020-09-01 NOTE — TOC Transition Note (Addendum)
Transition of Care [facility]) - CM/SW Discharge Note   Patient Details  Name: Shawn Osborn MRN: [id number] Date of Birth: February 02, 1951  [facility] Care [facility]) CM/SW Contact:  Verdell Carmine, RN Phone Number: 09/01/2020, 3:16 PM   Clinical Narrative:     [name] accepted for Home health PT, OT and social work. CSW and MD notified. Has walker and BSC at home for DME. NO further needs identified. Has transportation home.   Final next level of care: [place] Barriers to Discharge: No Barriers Identified   Patient Goals and CMS Choice Patient states their goals for this hospitalization and ongoing recovery are:: Return home CMS Medicare.gov Compare Post Acute Care list provided to:: Patient Choice offered to / list presented to : Patient, Adult Children  Discharge Placement                       Discharge Plan and Services In-house Referral: Clinical Social Work Discharge Planning Services: CM Consult Post Acute Care Choice: Home Health                    HH Arranged: OT, PT, Social Work [facility] Agency: [place] Date [place]: 09/01/20 Time HH Agency Contacted: 1500 Representative spoke with at [place]: [place] ([place]) Interventions     Readmission Risk Interventions No flowsheet data found.
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
## 2020-09-05 ENCOUNTER — Other Ambulatory Visit: Payer: Self-pay | Admitting: Family Medicine

## 2020-09-05 ENCOUNTER — Other Ambulatory Visit: Payer: Self-pay | Admitting: Physician Assistant

## 2020-09-07 ENCOUNTER — Telehealth: Payer: Medicare Other
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
## 2020-09-15 ENCOUNTER — Telehealth: Payer: Self-pay
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
## 2020-09-26 DIAGNOSIS — J449 Chronic obstructive pulmonary disease, unspecified: Secondary | ICD-10-CM | POA: Diagnosis not present

## 2020-10-03 ENCOUNTER — Other Ambulatory Visit: Payer: Self-pay | Admitting: Family Medicine

## 2020-10-03 DIAGNOSIS — R351 Nocturia: Secondary | ICD-10-CM

## 2020-10-19 ENCOUNTER — Telehealth: Payer: Self-pay
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
## 2020-10-26 DIAGNOSIS — J449 Chronic obstructive pulmonary disease, unspecified: Secondary | ICD-10-CM | POA: Diagnosis not present

## 2020-11-07 ENCOUNTER — Telehealth (INDEPENDENT_AMBULATORY_CARE_PROVIDER_SITE_OTHER): Payer: Medicare Other | Admitting: Nurse Practitioner

## 2020-11-07 ENCOUNTER — Other Ambulatory Visit: Payer: Self-pay | Admitting: Family Medicine

## 2020-11-07 ENCOUNTER — Encounter: Payer: Self-pay | Admitting: Nurse Practitioner

## 2020-11-07 VITALS — BP 130/69 | HR 79 | Ht 71.0 in | Wt 292.0 lb

## 2020-11-07 DIAGNOSIS — J441 Chronic obstructive pulmonary disease with (acute) exacerbation: Secondary | ICD-10-CM

## 2020-11-07 DIAGNOSIS — I2510 Atherosclerotic heart disease of native coronary artery without angina pectoris: Secondary | ICD-10-CM

## 2020-11-07 DIAGNOSIS — F17219 Nicotine dependence, cigarettes, with unspecified nicotine-induced disorders: Secondary | ICD-10-CM | POA: Diagnosis not present

## 2020-11-07 DIAGNOSIS — J9611 Chronic respiratory failure with hypoxia: Secondary | ICD-10-CM

## 2020-11-07 DIAGNOSIS — Z8616 Personal history of COVID-19: Secondary | ICD-10-CM | POA: Diagnosis not present

## 2020-11-07 DIAGNOSIS — J019 Acute sinusitis, unspecified: Secondary | ICD-10-CM | POA: Diagnosis not present

## 2020-11-07 DIAGNOSIS — Z9981 Dependence on supplemental oxygen: Secondary | ICD-10-CM

## 2020-11-07 MED ORDER — BENZONATATE 100 MG PO CAPS: 100.0000 mg | ORAL_CAPSULE | Freq: Two times a day (BID) | ORAL | 0 refills | Status: DC | PRN

## 2020-11-07 MED ORDER — AZITHROMYCIN 250 MG PO TABS: ORAL_TABLET | ORAL | 0 refills | Status: DC

## 2020-11-07 NOTE — Progress Notes (Signed)
Virtual Visit via Telephone Note   This visit type was conducted due to national recommendations for restrictions regarding the COVID-19 Pandemic (e.g. social distancing) in an effort to limit this patient's exposure and mitigate transmission in our community.  Due to his co-morbid illnesses, this patient is at least at moderate risk for complications without adequate follow up.  This format is felt to be most appropriate for this patient at this time.  The patient did not have access to video technology/had technical difficulties with video requiring transitioning to audio format only (telephone).  All issues noted in this document were discussed and addressed.  No physical exam could be performed with this format.  Patient verbally consented to a telehealth visit.   Date:  11/07/2020   ID:  Shawn Osborn, DOB [birth year], MRN [id number]  Patient Location: Home Provider Location: Office/Clinic  PCP:  Rochel Brome, MD   Evaluation Performed:  Established patient, acute telemedicine visit  Chief Complaint: Cough with shortness of breath  History of Present Illness:    Shawn Osborn is a 69 y.o. male with productive cough, chest congestion, and increased dyspnea. He denies fever, chills, body aches, or rash. Onset of symptoms was 7-days ago. Treatment has included Albuterol inhaler. Pt has a history of COPD with O2 dependence at night. He states he had been using Albuterol inhaler more frequently. He has received COVID19 and flu immunizations this year. He is currently a cigarette smoker for 55 years. States he has "cut back". Other past medical history includes CAD, HF, previous NSTEMI, and recent COVID-19 infection in October 2021.    The patient does have symptoms concerning for COVID-19 infection (fever, chills, cough, or new shortness of breath).    Past Medical History:  Diagnosis Date  . Acute combined systolic and diastolic CHF, NYHA class 3 ([place]) 10/08/2019  . Acute  delirium 10/08/2019  . Acute exacerbation of CHF (congestive heart failure) ([place]) 10/08/2019  . Acute gout of multiple sites 07/01/2016  . Acute respiratory failure with hypoxia and hypercapnia ([place]) 10/08/2019  . Allergy   . Anxiety   . Arthritis    Gout, osteoarthritis in Back and Knees  . Cataract   . CHF (congestive heart failure) ([place])   . Chronic bronchitis   . Community acquired pneumonia 10/08/2019  . COPD (chronic obstructive pulmonary disease) ([place]) 10/08/2019  . Dementia ([place]) 10/08/2019  . Elevated troponin 10/08/2019  . Emphysema of lung ([place])   . Essential hypertension 07/03/2016  . GERD (gastroesophageal reflux disease)   . History of kidney stones   . HLD (hyperlipidemia) 10/08/2019  . Hypercapnic respiratory failure ([place]) 10/08/2019  . Hyperlipidemia   . Hypertension   . Hypertensive heart disease 10/08/2019  . Morbid obesity due to excess calories ([place]) 10/08/2019  . Narcotic overdose ([place]) 10/08/2019  . 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 NSTEMI (non-ST elevated myocardial infarction) ([place]) 10/08/2019  . Obstructive sleep apnea   . Occlusion and stenosis of carotid artery without mention of cerebral infarction 12/13/2011  . OSA on CPAP 10/08/2019  . Pain in joint of left shoulder 01/12/2019  . Paranoia (psychosis) ([place]) 07/01/2016  . Poorly-controlled hypertension 10/08/2019  . Prediabetes   . Psychotic disorder with delusions ([place]) 07/01/2016  . Respiratory failure with hypoxia and hypercapnia ([place]) 10/08/2019  . Restless leg syndrome 07/03/2016  . Rheumatoid arthritis(714.0)   . RLS (restless legs syndrome)   . Sepsis ([place]) 10/08/2019  . Sleep apnea   .  Spinal stenosis   . Syncope 04/03/2019  . Tear of left rotator cuff 01/12/2019  . Toxic metabolic encephalopathy 35/57/3220  . UTI (urinary tract infection) 10/08/2019  . Vascular dementia with behavioral disturbance ([place]) 07/06/2016    Past Surgical History:  Procedure  Laterality Date  . APPENDECTOMY    . CARPAL TUNNEL RELEASE    . EYE SURGERY    . INTRAVASCULAR PRESSURE WIRE/FFR STUDY N/A 10/20/2019   Procedure: INTRAVASCULAR PRESSURE WIRE/FFR STUDY;  Surgeon: Belva Crome, MD;  Location: [facility];  Service: Cardiovascular;  Laterality: N/A;  . LEFT HEART CATH AND CORONARY ANGIOGRAPHY N/A 10/20/2019   Procedure: LEFT HEART CATH AND CORONARY ANGIOGRAPHY;  Surgeon: Belva Crome, MD;  Location: [facility];  Service: Cardiovascular;  Laterality: N/A;  . [place], 2003, 2012   Laminectomy X 3    Family History  Problem Relation Age of Onset  . Heart disease Mother   . Hypertension Mother     Social History   Socioeconomic History  . Marital status: Widowed    Spouse name: Not on file  . Number of children: 4  . Years of education: Not on file  . Highest education level: Not on file  Occupational History  . Not on file  Tobacco Use  . Smoking status: Current Every Day Smoker    Packs/day: 0.50    Years: 53.00    Pack years: 26.50    Last attempt to quit: 11/06/2018    Years since quitting: 2.0  . Smokeless tobacco: Current User    Types: Chew  Vaping Use  . Vaping Use: Never used  Substance and Sexual Activity  . Alcohol use: No  . Drug use: No  . Sexual activity: Not Currently  Other Topics Concern  . Not on file  Social History Narrative  . Not on file   Social Determinants of Health   Financial Resource Strain: Not on file  Food Insecurity: No Food Insecurity  . Worried About Charity fundraiser in the Last Year: Never true  . Ran Out of Food in the Last Year: Never true  Transportation Needs: Not on file  Physical Activity: Not on file  Stress: Not on file  Social Connections: Not on file  Intimate Partner Violence: Not on file    Outpatient Medications Prior to Visit  Medication Sig Dispense Refill  . albuterol (VENTOLIN HFA) 108 (90 Base) MCG/ACT inhaler Inhale 2 puffs into the lungs  every 6 (six) hours as needed for wheezing or shortness of breath. 18 g 5  . allopurinol (ZYLOPRIM) 300 MG tablet Take 1 tablet (300 mg total) by mouth daily. 30 tablet 5  . Ascorbic Acid (VITAMIN C) 500 MG CAPS Take 500 mg by mouth daily.    . Budeson-Glycopyrrol-Formoterol (BREZTRI AEROSPHERE) 160-9-4.8 MCG/ACT AERO Inhale 2 puffs into the lungs 2 (two) times daily. 10.7 g 2  . colchicine 0.6 MG tablet Take 1 tablet (0.6 mg total) by mouth daily. (Patient taking differently: Take 0.6 mg by mouth daily as needed (gout flares).) 14 tablet 3  . diclofenac Sodium (VOLTAREN) 1 % GEL Apply 2 g topically 4 (four) times daily as needed. 50 g 0  . docusate sodium (COLACE) 100 MG capsule Take 100 mg by mouth daily as needed for mild constipation.    Marland Kitchen donepezil (ARICEPT) 10 MG tablet TAKE ONE TABLET BY MOUTH AT BEDTIME 30 tablet 3  . furosemide (LASIX) 20 MG tablet Take  1 tablet (20 mg total) by mouth daily as needed. 30 tablet 3  . Glucosamine-Chondroit-Vit C-Mn (GLUCOSAMINE CHONDR 1500 COMPLX PO) Take by mouth daily.    . hydrOXYzine (ATARAX/VISTARIL) 50 MG tablet TAKE ONE TABLET BY MOUTH AT BEDTIME (Patient taking differently: Take 50 mg by mouth at bedtime.) 30 tablet 2  . lidocaine (LIDODERM) 5 % Place 1 patch onto the skin daily. Remove & Discard patch within 12 hours or as directed by MD 30 patch 0  . omeprazole (PRILOSEC) 40 MG capsule TAKE ONE CAPSULE BY MOUTH EVERY DAY (Patient taking differently: Take 40 mg by mouth daily.) 30 capsule 6  . oxyCODONE (ROXICODONE) 15 MG immediate release tablet TAKE ONE TABLET BY MOUTH 4 TIMES DAILY 120 tablet 0  . Potassium 99 MG TABS Take 297 mg by mouth daily as needed.     . pregabalin (LYRICA) 75 MG capsule Take 1 capsule (75 mg total) by mouth 2 (two) times daily. 60 capsule 3  . propranolol ER (INDERAL LA) 60 MG 24 hr capsule Take 1 capsule (60 mg total) by mouth daily. 30 capsule 3  . rosuvastatin (CRESTOR) 5 MG tablet Take 1 tablet (5 mg total) by mouth at  bedtime. 30 tablet 2  . sertraline (ZOLOFT) 100 MG tablet TAKE ONE TABLET BY MOUTH AT BEDTIME 30 tablet 3  . tamsulosin (FLOMAX) 0.4 MG CAPS capsule TAKE ONE CAPSULE BY MOUTH EVERY EVENING 30 capsule 3  . Vitamin D, Ergocalciferol, (DRISDOL) 1.25 MG (50000 UNIT) CAPS capsule Take 1 capsule (50,000 Units total) by mouth every 7 (seven) days. 12 capsule 0   No facility-administered medications prior to visit.    Allergies:   Avelox [moxifloxacin hcl in nacl], Codeine phosphate, Cozaar, Cymbalta [duloxetine hcl], Ropinirole, Statins, Welchol [colesevelam hcl], and Penicillins   Social History   Tobacco Use  . Smoking status: Current Every Day Smoker    Packs/day: 0.50    Years: 53.00    Pack years: 26.50    Last attempt to quit: 11/06/2018    Years since quitting: 2.0  . Smokeless tobacco: Current User    Types: Chew  Vaping Use  . Vaping Use: Never used  Substance Use Topics  . Alcohol use: No  . Drug use: No     Review of Systems  Constitutional: Negative for fever and malaise/fatigue.  HENT: Positive for congestion and sinus pain. Negative for ear pain and sore throat.   Respiratory: Positive for cough, sputum production and shortness of breath. Negative for wheezing.   Cardiovascular: Negative for chest pain.  Gastrointestinal: Negative for abdominal pain, constipation, diarrhea, heartburn, nausea and vomiting.  Genitourinary: Negative for dysuria, frequency and urgency.  Musculoskeletal: Negative for back pain, joint pain and myalgias.  Neurological: Positive for headaches. Negative for dizziness.  Psychiatric/Behavioral: Positive for memory loss.     Labs/Other Tests and Data Reviewed:    Recent Labs: 05/02/2020: TSH 2.360 09/01/2020: ALT 15; BUN 18; Creatinine, Ser 0.73; Hemoglobin 9.8; Platelets 294; Potassium 4.0; Sodium 139   Recent Lipid Panel Lab Results  Component Value Date/Time   CHOL 167 08/15/2020 04:36 PM   TRIG 145 08/15/2020 04:36 PM   HDL 42  08/15/2020 04:36 PM   CHOLHDL 4.0 08/15/2020 04:36 PM   LDLCALC 99 08/15/2020 04:36 PM    Wt Readings from Last 3 Encounters:  11/07/20 292 lb (132.5 kg)  09/01/20 293 lb 10.4 oz (133.2 kg)  08/15/20 279 lb (126.6 kg)     Objective:    Vital  Signs:  BP 130/69   Pulse 79   Ht 5\' 11"  (1.803 m)   Wt 292 lb (132.5 kg)   BMI 40.73 kg/m    Physical Exam No physical exam due to telemedicine visit  ASSESSMENT & PLAN:     1. Acute non-recurrent sinusitis, unspecified location - azithromycin (ZITHROMAX) 250 MG tablet; Take two tablets po on day one, take one tablet po day two-five  Dispense: 6 tablet; Refill: 0  2. COPD with acute exacerbation (HCC) - azithromycin (ZITHROMAX) 250 MG tablet; Take two tablets po on day one, take one tablet po day two-five  Dispense: 6 tablet; Refill: 0 - benzonatate (TESSALON) 100 MG capsule; Take 1 capsule (100 mg total) by mouth 2 (two) times daily as needed for cough.  Dispense: 20 capsule; Refill: 0  3. Chronic respiratory failure with hypoxia, on home O2 therapy (HCC) - benzonatate (TESSALON) 100 MG capsule; Take 1 capsule (100 mg total) by mouth 2 (two) times daily as needed for cough.  Dispense: 20 capsule; Refill: 0  4. History of COVID-19  5. Cigarette nicotine dependence with nicotine-induced disorder    Rest and push fluids Seek emergency medical care for severe shortness of breath or any other health concerns Contact office if symptoms fail to improve or worsen   COVID-19 Education: The signs and symptoms of COVID-19 were discussed with the patient and how to seek care for testing (follow up with PCP or arrange E-visit). The importance of social distancing was discussed today.   I spent 33 minutes dedicated to the care of this patient on the date of this encounter to include face-to-face time with the patient, as well as:  Follow Up:  Virtual Visit , PRN  Signed,  Jerrell Belfast, DNP  11/07/2020 at [place]

## 2020-11-08 ENCOUNTER — Encounter: Payer: Self-pay | Admitting: Family Medicine

## 2020-11-26 DIAGNOSIS — J449 Chronic obstructive pulmonary disease, unspecified: Secondary | ICD-10-CM | POA: Diagnosis not present

## 2020-11-28 ENCOUNTER — Telehealth: Payer: Self-pay

## 2020-11-28 NOTE — Telephone Encounter (Signed)
Shawn Osborn called to report that he completed his antibiotic and his tessalon perls but he remains congested and has continued cough.  He is scheduled to be seen in the office on Wednesday but Shawn Osborn prefers that he come tomorrow.  Appointment scheduled with Shawn Osborn.

## 2020-11-29 ENCOUNTER — Ambulatory Visit (INDEPENDENT_AMBULATORY_CARE_PROVIDER_SITE_OTHER): Payer: Medicare Other | Admitting: Family Medicine

## 2020-11-29 ENCOUNTER — Encounter: Payer: Self-pay | Admitting: Family Medicine

## 2020-11-29 VITALS — BP 142/74 | HR 54 | Resp 18 | Ht 70.0 in | Wt 292.0 lb

## 2020-11-29 DIAGNOSIS — R0989 Other specified symptoms and signs involving the circulatory and respiratory systems: Secondary | ICD-10-CM | POA: Insufficient documentation

## 2020-11-29 DIAGNOSIS — R059 Cough, unspecified: Secondary | ICD-10-CM | POA: Insufficient documentation

## 2020-11-29 DIAGNOSIS — J479 Bronchiectasis, uncomplicated: Secondary | ICD-10-CM | POA: Diagnosis not present

## 2020-11-29 LAB — POC COVID19 BINAXNOW: SARS Coronavirus 2 Ag: NEGATIVE

## 2020-11-29 LAB — POCT INFLUENZA A/B
Influenza A, POC: NEGATIVE
Influenza B, POC: NEGATIVE

## 2020-11-29 MED ORDER — ALBUTEROL SULFATE HFA 108 (90 BASE) MCG/ACT IN AERS: 2.0000 | INHALATION_SPRAY | Freq: Four times a day (QID) | RESPIRATORY_TRACT | 3 refills | Status: DC | PRN

## 2020-11-29 MED ORDER — PREDNISONE 10 MG PO TABS: ORAL_TABLET | ORAL | 0 refills | Status: DC

## 2020-11-29 NOTE — Patient Instructions (Signed)
Chronic Obstructive Pulmonary Disease Exacerbation Chronic obstructive pulmonary disease (COPD) is a long-term (chronic) lung problem. In COPD, the flow of air from the lungs is limited. COPD exacerbations are times that breathing gets worse and you need more than your normal treatment. Without treatment, they can be life threatening. If they happen often, your lungs can become more damaged. If your COPD gets worse, your doctor may treat you with:  Medicines.  Oxygen.  Different ways to clear your airway, such as using a mask. Follow these instructions at home: Medicines  Take over-the-counter and prescription medicines only as told by your doctor.  If you take an antibiotic or steroid medicine, do not stop taking the medicine even if you start to feel better.  Keep up with shots (vaccinations) as told by your doctor. Be sure to get a yearly (annual) flu shot. Lifestyle  Do not smoke. If you need help quitting, ask your doctor.  Eat healthy foods.  Exercise regularly.  Get plenty of sleep.  Avoid tobacco smoke and other things that can bother your lungs.  Wash your hands often with soap and water. This will help keep you from getting an infection. If you cannot use soap and water, use hand sanitizer.  During flu season, avoid areas that are crowded with people. General instructions  Drink enough fluid to keep your pee (urine) clear or pale yellow. Do not do this if your doctor has told you not to.  Use a cool mist machine (vaporizer).  If you use oxygen or a machine that turns medicine into a mist (nebulizer), continue to use it as told.  Follow all instructions for rehabilitation. These are steps you can take to make your body work better.  Keep all follow-up visits as told by your doctor. This is important. Contact a doctor if:  Your COPD symptoms get worse than normal. Get help right away if:  You are short of breath and it gets worse.  You have trouble  talking.  You have chest pain.  You cough up blood.  You have a fever.  You keep throwing up (vomiting).  You feel weak or you pass out (faint).  You feel confused.  You are not able to sleep because of your symptoms.  You are not able to do daily activities. Summary  COPD exacerbations are times that breathing gets worse and you need more treatment than normal.  COPD exacerbations can be very serious and may cause your lungs to become more damaged.  Do not smoke. If you need help quitting, ask your doctor.  Stay up-to-date on your shots. Get a flu shot every year. This information is not intended to replace advice given to you by your health care provider. Make sure you discuss any questions you have with your health care provider. Document Revised: 10/18/2017 Document Reviewed: 12/10/2016 Elsevier Patient Education  2021 [place] up mucinex Albuterol -use as needed every 4-6 hours Prednisone -start tonight

## 2020-11-29 NOTE — Progress Notes (Signed)
Established Patient Office Visit  Subjective:  Patient ID: Shawn Osborn, male    DOB: Mar 31, 1951  Age: 70 y.o. MRN: [id number]  CC: COPD  HPI Shawn Osborn presents for pt with h/o COPD-increase use of Brezti-out of albuterol. Pt states cough and SOB with fatigue . No headache. No n/v/d  Past Medical History:  Diagnosis Date  . Acute combined systolic and diastolic CHF, NYHA class 3 ([place]) 10/08/2019  . Acute delirium 10/08/2019  . Acute exacerbation of CHF (congestive heart failure) ([place]) 10/08/2019  . Acute gout of multiple sites 07/01/2016  . Acute respiratory failure with hypoxia and hypercapnia ([place]) 10/08/2019  . Allergy   . Anxiety   . Arthritis    Gout, osteoarthritis in Back and Knees  . Cataract   . CHF (congestive heart failure) ([place])   . Chronic bronchitis   . Community acquired pneumonia 10/08/2019  . COPD (chronic obstructive pulmonary disease) ([place]) 10/08/2019  . Dementia ([place]) 10/08/2019  . Elevated troponin 10/08/2019  . Emphysema of lung ([place])   . Essential hypertension 07/03/2016  . GERD (gastroesophageal reflux disease)   . History of kidney stones   . HLD (hyperlipidemia) 10/08/2019  . Hypercapnic respiratory failure ([place]) 10/08/2019  . Hyperlipidemia   . Hypertension   . Hypertensive heart disease 10/08/2019  . Morbid obesity due to excess calories ([place]) 10/08/2019  . Narcotic overdose ([place]) 10/08/2019  . 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 NSTEMI (non-ST elevated myocardial infarction) ([place]) 10/08/2019  . Obstructive sleep apnea   . Occlusion and stenosis of carotid artery without mention of cerebral infarction 12/13/2011  . OSA on CPAP 10/08/2019  . Pain in joint of left shoulder 01/12/2019  . Paranoia (psychosis) ([place]) 07/01/2016  . Poorly-controlled hypertension 10/08/2019  . Prediabetes   . Psychotic disorder with delusions ([place]) 07/01/2016  . Respiratory failure with hypoxia and hypercapnia ([place]) 10/08/2019   . Restless leg syndrome 07/03/2016  . Rheumatoid arthritis(714.0)   . RLS (restless legs syndrome)   . Sepsis ([place]) 10/08/2019  . Sleep apnea   . Spinal stenosis   . Syncope 04/03/2019  . Tear of left rotator cuff 01/12/2019  . Toxic metabolic encephalopathy 28/31/5176  . UTI (urinary tract infection) 10/08/2019  . Vascular dementia with behavioral disturbance ([place]) 07/06/2016    Past Surgical History:  Procedure Laterality Date  . APPENDECTOMY    . CARPAL TUNNEL RELEASE    . EYE SURGERY    . INTRAVASCULAR PRESSURE WIRE/FFR STUDY N/A 10/20/2019   Procedure: INTRAVASCULAR PRESSURE WIRE/FFR STUDY;  Surgeon: Belva Crome, MD;  Location: [facility];  Service: Cardiovascular;  Laterality: N/A;  . LEFT HEART CATH AND CORONARY ANGIOGRAPHY N/A 10/20/2019   Procedure: LEFT HEART CATH AND CORONARY ANGIOGRAPHY;  Surgeon: Belva Crome, MD;  Location: [facility];  Service: Cardiovascular;  Laterality: N/A;  . [place], 2003, 2012   Laminectomy X 3    Family History  Problem Relation Age of Onset  . Heart disease Mother   . Hypertension Mother     Social History   Socioeconomic History  . Marital status: Widowed    Spouse name: Not on file  . Number of children: 4  . Years of education: Not on file  . Highest education level: Not on file  Occupational History  . Not on file  Tobacco Use  . Smoking status: Current Every Day Smoker    Packs/day: 0.50  Years: 53.00    Pack years: 26.50    Last attempt to quit: 11/06/2018    Years since quitting: 2.0  . Smokeless tobacco: Current User    Types: Chew  Vaping Use  . Vaping Use: Never used  Substance and Sexual Activity  . Alcohol use: No  . Drug use: No  . Sexual activity: Not Currently  Other Topics Concern  . Not on file  Social History Narrative  . Not on file   Social Determinants of Health   Financial Resource Strain: Not on file  Food Insecurity: No Food Insecurity  . Worried About  Charity fundraiser in the Last Year: Never true  . Ran Out of Food in the Last Year: Never true  Transportation Needs: Not on file  Physical Activity: Not on file  Stress: Not on file  Social Connections: Not on file  Intimate Partner Violence: Not on file    Outpatient Medications Prior to Visit  Medication Sig Dispense Refill  . albuterol (VENTOLIN HFA) 108 (90 Base) MCG/ACT inhaler Inhale 2 puffs into the lungs every 6 (six) hours as needed for wheezing or shortness of breath. 18 g 5  . allopurinol (ZYLOPRIM) 300 MG tablet Take 1 tablet (300 mg total) by mouth daily. 30 tablet 5  . Ascorbic Acid (VITAMIN C) 500 MG CAPS Take 500 mg by mouth daily.    . benzonatate (TESSALON) 100 MG capsule Take 1 capsule (100 mg total) by mouth 2 (two) times daily as needed for cough. 20 capsule 0  . BREZTRI AEROSPHERE 160-9-4.8 MCG/ACT AERO Inhale 2 puffs into the lungs 2 (two) times daily. 10.7 g 2  . colchicine 0.6 MG tablet Take 1 tablet (0.6 mg total) by mouth daily. (Patient taking differently: Take 0.6 mg by mouth daily as needed (gout flares).) 14 tablet 3  . diclofenac Sodium (VOLTAREN) 1 % GEL Apply 2 g topically 4 (four) times daily as needed. 50 g 0  . docusate sodium (COLACE) 100 MG capsule Take 100 mg by mouth daily as needed for mild constipation.    Marland Kitchen donepezil (ARICEPT) 10 MG tablet TAKE ONE TABLET BY MOUTH AT BEDTIME 30 tablet 3  . furosemide (LASIX) 20 MG tablet Take 1 tablet (20 mg total) by mouth daily as needed. 30 tablet 3  . Glucosamine-Chondroit-Vit C-Mn (GLUCOSAMINE CHONDR 1500 COMPLX PO) Take by mouth daily.    . hydrOXYzine (ATARAX/VISTARIL) 50 MG tablet Take 1 tablet (50 mg total) by mouth at bedtime. 30 tablet 5  . lidocaine (LIDODERM) 5 % Place 1 patch onto the skin daily. Remove & Discard patch within 12 hours or as directed by MD 30 patch 0  . omeprazole (PRILOSEC) 40 MG capsule TAKE ONE CAPSULE BY MOUTH EVERY DAY (Patient taking differently: Take 40 mg by mouth daily.) 30  capsule 6  . oxyCODONE (ROXICODONE) 15 MG immediate release tablet TAKE ONE TABLET BY MOUTH 4 TIMES DAILY 120 tablet 0  . Potassium 99 MG TABS Take 297 mg by mouth daily as needed.     . pregabalin (LYRICA) 75 MG capsule Take 1 capsule (75 mg total) by mouth 2 (two) times daily. 60 capsule 3  . propranolol ER (INDERAL LA) 60 MG 24 hr capsule Take 1 capsule (60 mg total) by mouth daily. 30 capsule 3  . rosuvastatin (CRESTOR) 5 MG tablet Take 1 tablet (5 mg total) by mouth at bedtime. 30 tablet 2  . sertraline (ZOLOFT) 100 MG tablet TAKE ONE TABLET BY MOUTH  AT BEDTIME 30 tablet 3  . tamsulosin (FLOMAX) 0.4 MG CAPS capsule TAKE ONE CAPSULE BY MOUTH EVERY EVENING 30 capsule 3  . Vitamin D, Ergocalciferol, (DRISDOL) 1.25 MG (50000 UNIT) CAPS capsule TAKE ONE TABLET BY MOUTH EVERY WEEK 4 capsule 5  . azithromycin (ZITHROMAX) 250 MG tablet Take two tablets po on day one, take one tablet po day two-five 6 tablet 0   No facility-administered medications prior to visit.    Allergies  Allergen Reactions  . Avelox [Moxifloxacin Hcl In Nacl] Swelling  . Codeine Phosphate Itching  . Cozaar     Unknown   . Cymbalta [Duloxetine Hcl]     Twitching  . Ropinirole     Weakness, dizziness, felt sick  . Statins     myalgia  . Welchol [Colesevelam Hcl]   . Penicillins Rash    Did it involve swelling of the face/tongue/throat, SOB, or low BP? No Did it involve sudden or severe rash/hives, skin peeling, or any reaction on the inside of your mouth or nose? Yes Did you need to seek medical attention at a hospital or doctor's office? Yes When did it last happen?15 + years If all above answers are "NO", may proceed with cephalosporin use.     ROS Review of Systems  Constitutional: Positive for fatigue. Negative for fever.  HENT: Positive for congestion.   Respiratory: Positive for cough and shortness of breath. Negative for wheezing.   Cardiovascular: Negative for chest pain and palpitations.   Gastrointestinal: Negative for diarrhea, nausea and vomiting.  Neurological: Negative for dizziness and headaches.      Objective:    Physical Exam Constitutional:      Appearance: He is obese.  HENT:     Head: Normocephalic and atraumatic.  Cardiovascular:     Rate and Rhythm: Normal rate and regular rhythm.     Pulses: Normal pulses.     Heart sounds: Normal heart sounds.  Musculoskeletal:     Cervical back: Normal range of motion and neck supple.     Right lower leg: No edema.     Left lower leg: No edema.  Skin:    General: Skin is warm.  Neurological:     Mental Status: He is alert and oriented to person, place, and time.  Psychiatric:        Mood and Affect: Mood normal.        Behavior: Behavior normal.     BP (!) 142/74   Pulse (!) 54   Resp 18   Ht 5\' 10"  (1.778 m)   Wt 292 lb (132.5 kg)   SpO2 98%   BMI 41.90 kg/m  Wt Readings from Last 3 Encounters:  11/29/20 292 lb (132.5 kg)  11/07/20 292 lb (132.5 kg)  09/01/20 293 lb 10.4 oz (133.2 kg)     Health Maintenance Due  Topic Date Due  . Hepatitis C Screening  Never done  . COVID-19 Vaccine (1) Never done  . TETANUS/TDAP  Never done  . COLONOSCOPY (Pts 45-4yrs Insurance coverage will need to be confirmed)  09/16/2015     Lab Results  Component Value Date   TSH 2.360 05/02/2020   Lab Results  Component Value Date   WBC 10.7 (H) 09/01/2020   HGB 9.8 (L) 09/01/2020   HCT 34.7 (L) 09/01/2020   MCV 77.8 (L) 09/01/2020   PLT 294 09/01/2020   Lab Results  Component Value Date   NA 139 09/01/2020   K 4.0 09/01/2020  CO2 28 09/01/2020   GLUCOSE 86 09/01/2020   BUN 18 09/01/2020   CREATININE 0.73 09/01/2020   BILITOT 0.6 09/01/2020   ALKPHOS 50 09/01/2020   AST 17 09/01/2020   ALT 15 09/01/2020   PROT 6.1 (L) 09/01/2020   ALBUMIN 2.9 (L) 09/01/2020   CALCIUM 8.5 (L) 09/01/2020   ANIONGAP 11 09/01/2020   Lab Results  Component Value Date   CHOL 167 08/15/2020   Lab Results   Component Value Date   HDL 42 08/15/2020   Lab Results  Component Value Date   LDLCALC 99 08/15/2020   Lab Results  Component Value Date   TRIG 145 08/15/2020   Lab Results  Component Value Date   CHOLHDL 4.0 08/15/2020   Lab Results  Component Value Date   HGBA1C 5.5 08/15/2020      Assessment & Plan:  1. Chest congestion - POC COVID-19-negative - Influenza A/B-negative Trigger for COPD-mucinex 2. Cough-likely exacerbation COPD Prednisone-rx-d/w pt risk/benefit/side effects - POC COVID-19-negative - Influenza A/B-negaitive Follow-up:cxr-results: chronic parenchymal lung changes , no focal consolidation or overt pulmonary edema   Lylith Bebeau Hannah Beat, MD

## 2020-11-30 ENCOUNTER — Ambulatory Visit: Payer: Medicare Other | Admitting: Family Medicine

## 2020-12-05 ENCOUNTER — Telehealth: Payer: Self-pay
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
## 2020-12-06 ENCOUNTER — Other Ambulatory Visit: Payer: Self-pay | Admitting: Family Medicine

## 2020-12-13 ENCOUNTER — Other Ambulatory Visit: Payer: Self-pay

## 2020-12-13 ENCOUNTER — Encounter: Payer: Self-pay | Admitting: Family Medicine

## 2020-12-13 ENCOUNTER — Ambulatory Visit (INDEPENDENT_AMBULATORY_CARE_PROVIDER_SITE_OTHER): Payer: Medicare Other | Admitting: Family Medicine

## 2020-12-13 VITALS — BP 140/76 | HR 88 | Temp 97.3°F | Resp 20 | Ht 70.0 in | Wt 289.0 lb

## 2020-12-13 DIAGNOSIS — J9621 Acute and chronic respiratory failure with hypoxia: Secondary | ICD-10-CM | POA: Diagnosis not present

## 2020-12-13 DIAGNOSIS — J441 Chronic obstructive pulmonary disease with (acute) exacerbation: Secondary | ICD-10-CM

## 2020-12-13 MED ORDER — ALBUTEROL SULFATE HFA 108 (90 BASE) MCG/ACT IN AERS: 2.0000 | INHALATION_SPRAY | Freq: Four times a day (QID) | RESPIRATORY_TRACT | 3 refills | Status: DC | PRN

## 2020-12-13 MED ORDER — CEFDINIR 300 MG PO CAPS: 300.0000 mg | ORAL_CAPSULE | Freq: Two times a day (BID) | ORAL | 0 refills | Status: DC

## 2020-12-13 MED ORDER — TRIAMCINOLONE ACETONIDE 40 MG/ML IJ SUSP
80.0000 mg | Freq: Once | INTRAMUSCULAR | Status: AC
  Administered 2020-12-13: 80 mg via INTRAMUSCULAR

## 2020-12-13 MED ORDER — CEFTRIAXONE SODIUM 1 G IJ SOLR
1.0000 g | Freq: Once | INTRAMUSCULAR | Status: AC
  Administered 2020-12-13: 1 g via INTRAMUSCULAR

## 2020-12-13 MED ORDER — ALBUTEROL SULFATE (2.5 MG/3ML) 0.083% IN NEBU: 2.5000 mg | INHALATION_SOLUTION | Freq: Four times a day (QID) | RESPIRATORY_TRACT | 12 refills | Status: DC | PRN

## 2020-12-13 MED ORDER — AZITHROMYCIN 250 MG PO TABS: ORAL_TABLET | ORAL | 0 refills | Status: DC

## 2020-12-13 MED ORDER — PREDNISONE 20 MG PO TABS: ORAL_TABLET | ORAL | 0 refills | Status: AC

## 2020-12-13 NOTE — Progress Notes (Signed)
Acute Office Visit  Subjective:    Patient ID: Shawn Osborn, male    DOB: 1951/08/09, 70 y.o.   MRN: [id number]  Chief Complaint  Patient presents with  . Cough    HPI Patient is in complaining of cough, chest congestion, SOB. No chest pain, fever, chills, and sweats. His cough has been going on for 3 weeks. His O2 saturation was 86% after walking in. It resolved with placement of oxygen at 3 L.  He has oxygen at home.   Past Medical History:  Diagnosis Date  . Acute combined systolic and diastolic CHF, NYHA class 3 ([place]) 10/08/2019  . Acute delirium 10/08/2019  . Acute exacerbation of CHF (congestive heart failure) ([place]) 10/08/2019  . Acute gout of multiple sites 07/01/2016  . Acute respiratory failure with hypoxia and hypercapnia ([place]) 10/08/2019  . Allergy   . Anxiety   . Arthritis    Gout, osteoarthritis in Back and Knees  . Cataract   . CHF (congestive heart failure) ([place])   . Chronic bronchitis   . Community acquired pneumonia 10/08/2019  . COPD (chronic obstructive pulmonary disease) ([place]) 10/08/2019  . Dementia ([place]) 10/08/2019  . Elevated troponin 10/08/2019  . Emphysema of lung ([place])   . Essential hypertension 07/03/2016  . GERD (gastroesophageal reflux disease)   . History of kidney stones   . HLD (hyperlipidemia) 10/08/2019  . Hypercapnic respiratory failure ([place]) 10/08/2019  . Hyperlipidemia   . Hypertension   . Hypertensive heart disease 10/08/2019  . Morbid obesity due to excess calories ([place]) 10/08/2019  . Narcotic overdose ([place]) 10/08/2019  . 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 NSTEMI (non-ST elevated myocardial infarction) ([place]) 10/08/2019  . Obstructive sleep apnea   . Occlusion and stenosis of carotid artery without mention of cerebral infarction 12/13/2011  . OSA on CPAP 10/08/2019  . Pain in joint of left shoulder 01/12/2019  . Paranoia (psychosis) ([place]) 07/01/2016  . Poorly-controlled hypertension 10/08/2019  .  Prediabetes   . Psychotic disorder with delusions ([place]) 07/01/2016  . Respiratory failure with hypoxia and hypercapnia ([place]) 10/08/2019  . Restless leg syndrome 07/03/2016  . Rheumatoid arthritis(714.0)   . RLS (restless legs syndrome)   . Sepsis ([place]) 10/08/2019  . Sleep apnea   . Spinal stenosis   . Syncope 04/03/2019  . Tear of left rotator cuff 01/12/2019  . Toxic metabolic encephalopathy A999333  . UTI (urinary tract infection) 10/08/2019  . Vascular dementia with behavioral disturbance ([place]) 07/06/2016    Past Surgical History:  Procedure Laterality Date  . APPENDECTOMY    . CARPAL TUNNEL RELEASE    . EYE SURGERY    . INTRAVASCULAR PRESSURE WIRE/FFR STUDY N/A 10/20/2019   Procedure: INTRAVASCULAR PRESSURE WIRE/FFR STUDY;  Surgeon: Belva Crome, MD;  Location: [facility];  Service: Cardiovascular;  Laterality: N/A;  . LEFT HEART CATH AND CORONARY ANGIOGRAPHY N/A 10/20/2019   Procedure: LEFT HEART CATH AND CORONARY ANGIOGRAPHY;  Surgeon: Belva Crome, MD;  Location: [facility];  Service: Cardiovascular;  Laterality: N/A;  . [place], 2003, 2012   Laminectomy X 3    Family History  Problem Relation Age of Onset  . Heart disease Mother   . Hypertension Mother     Social History   Socioeconomic History  . Marital status: Widowed    Spouse name: Not on file  . Number of children: 4  . Years of education: Not on  file  . Highest education level: Not on file  Occupational History  . Not on file  Tobacco Use  . Smoking status: Current Every Day Smoker    Packs/day: 0.50    Years: 53.00    Pack years: 26.50    Last attempt to quit: 11/06/2018    Years since quitting: 2.1  . Smokeless tobacco: Current User    Types: Chew  Vaping Use  . Vaping Use: Never used  Substance and Sexual Activity  . Alcohol use: No  . Drug use: No  . Sexual activity: Not Currently  Other Topics Concern  . Not on file  Social History Narrative  . Not on file    Social Determinants of Health   Financial Resource Strain: Not on file  Food Insecurity: No Food Insecurity  . Worried About Charity fundraiser in the Last Year: Never true  . Ran Out of Food in the Last Year: Never true  Transportation Needs: Not on file  Physical Activity: Not on file  Stress: Not on file  Social Connections: Not on file  Intimate Partner Violence: Not on file    Outpatient Medications Prior to Visit  Medication Sig Dispense Refill  . allopurinol (ZYLOPRIM) 300 MG tablet Take 1 tablet (300 mg total) by mouth daily. 30 tablet 5  . Ascorbic Acid (VITAMIN C) 500 MG CAPS Take 500 mg by mouth daily.    Marland Kitchen BREZTRI AEROSPHERE 160-9-4.8 MCG/ACT AERO Inhale 2 puffs into the lungs 2 (two) times daily. 10.7 g 2  . colchicine 0.6 MG tablet Take 1 tablet (0.6 mg total) by mouth daily. (Patient taking differently: Take 0.6 mg by mouth daily as needed (gout flares).) 14 tablet 3  . diclofenac Sodium (VOLTAREN) 1 % GEL Apply 2 g topically 4 (four) times daily as needed. 50 g 0  . docusate sodium (COLACE) 100 MG capsule Take 100 mg by mouth daily as needed for mild constipation.    Marland Kitchen donepezil (ARICEPT) 10 MG tablet TAKE ONE TABLET BY MOUTH AT BEDTIME 30 tablet 3  . furosemide (LASIX) 20 MG tablet Take 1 tablet (20 mg total) by mouth daily as needed. 30 tablet 3  . Glucosamine-Chondroit-Vit C-Mn (GLUCOSAMINE CHONDR 1500 COMPLX PO) Take by mouth daily.    . hydrOXYzine (ATARAX/VISTARIL) 50 MG tablet Take 1 tablet (50 mg total) by mouth at bedtime. 30 tablet 5  . lidocaine (LIDODERM) 5 % Place 1 patch onto the skin daily. Remove & Discard patch within 12 hours or as directed by MD 30 patch 0  . omeprazole (PRILOSEC) 40 MG capsule TAKE ONE CAPSULE BY MOUTH EVERY DAY (Patient taking differently: Take 40 mg by mouth daily.) 30 capsule 6  . oxyCODONE (ROXICODONE) 15 MG immediate release tablet TAKE ONE TABLET BY MOUTH 4 TIMES DAILY 120 tablet 0  . Potassium 99 MG TABS Take 297 mg by mouth  daily as needed.     . pregabalin (LYRICA) 75 MG capsule Take 1 capsule (75 mg total) by mouth 2 (two) times daily. 60 capsule 3  . propranolol ER (INDERAL LA) 60 MG 24 hr capsule Take 1 capsule (60 mg total) by mouth daily. 30 capsule 3  . rosuvastatin (CRESTOR) 5 MG tablet TAKE ONE TABLET BY MOUTH AT BEDTIME 90 tablet 2  . sertraline (ZOLOFT) 100 MG tablet TAKE ONE TABLET BY MOUTH AT BEDTIME 30 tablet 3  . tamsulosin (FLOMAX) 0.4 MG CAPS capsule TAKE ONE CAPSULE BY MOUTH EVERY EVENING 30 capsule 3  .  Vitamin D, Ergocalciferol, (DRISDOL) 1.25 MG (50000 UNIT) CAPS capsule TAKE ONE TABLET BY MOUTH EVERY WEEK 4 capsule 5  . albuterol (VENTOLIN HFA) 108 (90 Base) MCG/ACT inhaler Inhale 2 puffs into the lungs every 6 (six) hours as needed for wheezing or shortness of breath. 8 g 3  . benzonatate (TESSALON) 100 MG capsule Take 1 capsule (100 mg total) by mouth 2 (two) times daily as needed for cough. 20 capsule 0  . predniSONE (DELTASONE) 10 MG tablet Take 6-10mg  po today then 3-10mg   BID x 2 days, then 2-10mg  po BID x 3 days, then 1-10mg  BID x 3 days, then 1 po qd x 3 days 39 tablet 0   No facility-administered medications prior to visit.    Allergies  Allergen Reactions  . Avelox [Moxifloxacin Hcl In Nacl] Swelling  . Codeine Phosphate Itching  . Cozaar     Unknown   . Cymbalta [Duloxetine Hcl]     Twitching  . Ropinirole     Weakness, dizziness, felt sick  . Statins     myalgia  . Welchol [Colesevelam Hcl]   . Penicillins Rash    Did it involve swelling of the face/tongue/throat, SOB, or low BP? No Did it involve sudden or severe rash/hives, skin peeling, or any reaction on the inside of your mouth or nose? Yes Did you need to seek medical attention at a hospital or doctor's office? Yes When did it last happen?15 + years If all above answers are "NO", may proceed with cephalosporin use.     Review of Systems  Constitutional: Negative for chills and fever.  HENT: Positive for  congestion. Negative for rhinorrhea and sore throat.   Respiratory: Positive for cough and shortness of breath.   Cardiovascular: Negative for chest pain and palpitations.  Gastrointestinal: Negative for abdominal pain, constipation, diarrhea, nausea and vomiting.  Genitourinary: Negative for dysuria and urgency.  Musculoskeletal: Negative for arthralgias, back pain and myalgias.  Skin: Positive for wound (left elbow).  Neurological: Negative for dizziness and headaches.  Psychiatric/Behavioral: Negative for dysphoric mood. The patient is not nervous/anxious.        Objective:    Physical Exam Constitutional:      Appearance: Normal appearance. He is obese.  HENT:     Right Ear: Tympanic membrane, ear canal and external ear normal.     Left Ear: Tympanic membrane, ear canal and external ear normal.     Nose: Nose normal. No congestion or rhinorrhea.     Mouth/Throat:     Mouth: Mucous membranes are moist.     Pharynx: No oropharyngeal exudate or posterior oropharyngeal erythema.  Cardiovascular:     Rate and Rhythm: Normal rate and regular rhythm.     Heart sounds: Normal heart sounds.  Pulmonary:     Effort: Respiratory distress present.     Breath sounds: Wheezing and rhonchi present. No rales.  Lymphadenopathy:     Cervical: No cervical adenopathy.  Neurological:     Mental Status: He is alert.  Psychiatric:        Mood and Affect: Mood normal.        Behavior: Behavior normal.     BP 140/76   Pulse 88   Temp (!) 97.3 F (36.3 C)   Resp 20   Ht 5\' 10"  (1.778 m)   Wt 289 lb (131.1 kg)   SpO2 96% Comment: after beginning O2 @ 3 L/M.  BMI 41.47 kg/m  Wt Readings from Last 3 Encounters:  12/13/20 289 lb (131.1 kg)  11/29/20 292 lb (132.5 kg)  11/07/20 292 lb (132.5 kg)    Health Maintenance Due  Topic Date Due  . Hepatitis C Screening  Never done  . COVID-19 Vaccine (1) Never done  . TETANUS/TDAP  Never done  . COLONOSCOPY (Pts 45-17yrs Insurance coverage  will need to be confirmed)  09/16/2015    There are no preventive care reminders to display for this patient.   Lab Results  Component Value Date   TSH 2.360 05/02/2020   Lab Results  Component Value Date   WBC 10.7 (H) 09/01/2020   HGB 9.8 (L) 09/01/2020   HCT 34.7 (L) 09/01/2020   MCV 77.8 (L) 09/01/2020   PLT 294 09/01/2020   Lab Results  Component Value Date   NA 139 09/01/2020   K 4.0 09/01/2020   CO2 28 09/01/2020   GLUCOSE 86 09/01/2020   BUN 18 09/01/2020   CREATININE 0.73 09/01/2020   BILITOT 0.6 09/01/2020   ALKPHOS 50 09/01/2020   AST 17 09/01/2020   ALT 15 09/01/2020   PROT 6.1 (L) 09/01/2020   ALBUMIN 2.9 (L) 09/01/2020   CALCIUM 8.5 (L) 09/01/2020   ANIONGAP 11 09/01/2020   Lab Results  Component Value Date   CHOL 167 08/15/2020   Lab Results  Component Value Date   HDL 42 08/15/2020   Lab Results  Component Value Date   LDLCALC 99 08/15/2020   Lab Results  Component Value Date   TRIG 145 08/15/2020   Lab Results  Component Value Date   CHOLHDL 4.0 08/15/2020   Lab Results  Component Value Date   HGBA1C 5.5 08/15/2020       Assessment & Plan:  1. COPD with acute exacerbation ([place]) Shots given: Kenalog 80 mg and Rocephin 1 gm given Rxs for cefdinir and zithromax.  Rx for prednisone. Recommend use nebulizer with albuterol four times a day x 2 days and then four times a day prn. - triamcinolone acetonide (KENALOG-40) injection 80 mg - cefTRIAXone (ROCEPHIN) injection 1 g - cefdinir (OMNICEF) 300 MG capsule; Take 1 capsule (300 mg total) by mouth 2 (two) times daily.  Dispense: 20 capsule; Refill: 0 - azithromycin (ZITHROMAX) 250 MG tablet; 2 pills on day 1, one pill daily for then following 4 days  Dispense: 6 tablet; Refill: 0 - predniSONE (DELTASONE) 20 MG tablet; Take 3 tablets (60 mg total) by mouth daily with breakfast for 3 days, THEN 2 tablets (40 mg total) daily with breakfast for 3 days, THEN 1 tablet (20 mg total) daily with  breakfast for 3 days.  Dispense: 18 tablet; Refill: 0  2. Acute on chronic respiratory failure with hypoxia (HCC)  Pt is to wear oxygen 3 L as soon as he gets home.   Meds ordered this encounter  Medications  . triamcinolone acetonide (KENALOG-40) injection 80 mg  . cefTRIAXone (ROCEPHIN) injection 1 g  . cefdinir (OMNICEF) 300 MG capsule    Sig: Take 1 capsule (300 mg total) by mouth 2 (two) times daily.    Dispense:  20 capsule    Refill:  0  . azithromycin (ZITHROMAX) 250 MG tablet    Sig: 2 pills on day 1, one pill daily for then following 4 days    Dispense:  6 tablet    Refill:  0  . predniSONE (DELTASONE) 20 MG tablet    Sig: Take 3 tablets (60 mg total) by mouth daily with breakfast for 3 days, THEN 2  tablets (40 mg total) daily with breakfast for 3 days, THEN 1 tablet (20 mg total) daily with breakfast for 3 days.    Dispense:  18 tablet    Refill:  0  . albuterol (VENTOLIN HFA) 108 (90 Base) MCG/ACT inhaler    Sig: Inhale 2 puffs into the lungs every 6 (six) hours as needed for wheezing or shortness of breath.    Dispense:  8 g    Refill:  3  . albuterol (PROVENTIL) (2.5 MG/3ML) 0.083% nebulizer solution    Sig: Take 3 mLs (2.5 mg total) by nebulization every 6 (six) hours as needed for wheezing or shortness of breath.    Dispense:  120 mL    Refill:  12    40 vials.    Follow-up: No follow-ups on file.  An After Visit Summary was printed and given to the patient.  Rochel Brome, MD [facility] [phone]

## 2020-12-18 ENCOUNTER — Encounter: Payer: Self-pay | Admitting: Family Medicine

## 2020-12-20 ENCOUNTER — Ambulatory Visit (INDEPENDENT_AMBULATORY_CARE_PROVIDER_SITE_OTHER): Payer: Medicare Other | Admitting: Family Medicine

## 2020-12-20 DIAGNOSIS — G4733 Obstructive sleep apnea (adult) (pediatric): Secondary | ICD-10-CM

## 2020-12-20 DIAGNOSIS — I10 Essential (primary) hypertension: Secondary | ICD-10-CM

## 2020-12-20 DIAGNOSIS — J441 Chronic obstructive pulmonary disease with (acute) exacerbation: Secondary | ICD-10-CM

## 2020-12-20 DIAGNOSIS — R7303 Prediabetes: Secondary | ICD-10-CM

## 2020-12-20 NOTE — Progress Notes (Deleted)
Subjective:  Patient ID: Shawn Osborn, male    DOB: 1950/12/24  Age: 70 y.o. MRN: [id number]  No chief complaint on file.   HPI   Current Outpatient Medications on File Prior to Visit  Medication Sig Dispense Refill  . albuterol (PROVENTIL) (2.5 MG/3ML) 0.083% nebulizer solution Take 3 mLs (2.5 mg total) by nebulization every 6 (six) hours as needed for wheezing or shortness of breath. 120 mL 12  . albuterol (VENTOLIN HFA) 108 (90 Base) MCG/ACT inhaler Inhale 2 puffs into the lungs every 6 (six) hours as needed for wheezing or shortness of breath. 8 g 3  . allopurinol (ZYLOPRIM) 300 MG tablet Take 1 tablet (300 mg total) by mouth daily. 30 tablet 5  . Ascorbic Acid (VITAMIN C) 500 MG CAPS Take 500 mg by mouth daily.    Marland Kitchen azithromycin (ZITHROMAX) 250 MG tablet 2 pills on day 1, one pill daily for then following 4 days 6 tablet 0  . BREZTRI AEROSPHERE 160-9-4.8 MCG/ACT AERO Inhale 2 puffs into the lungs 2 (two) times daily. 10.7 g 2  . cefdinir (OMNICEF) 300 MG capsule Take 1 capsule (300 mg total) by mouth 2 (two) times daily. 20 capsule 0  . colchicine 0.6 MG tablet Take 1 tablet (0.6 mg total) by mouth daily. (Patient taking differently: Take 0.6 mg by mouth daily as needed (gout flares).) 14 tablet 3  . diclofenac Sodium (VOLTAREN) 1 % GEL Apply 2 g topically 4 (four) times daily as needed. 50 g 0  . docusate sodium (COLACE) 100 MG capsule Take 100 mg by mouth daily as needed for mild constipation.    Marland Kitchen donepezil (ARICEPT) 10 MG tablet TAKE ONE TABLET BY MOUTH AT BEDTIME 30 tablet 3  . furosemide (LASIX) 20 MG tablet Take 1 tablet (20 mg total) by mouth daily as needed. 30 tablet 3  . Glucosamine-Chondroit-Vit C-Mn (GLUCOSAMINE CHONDR 1500 COMPLX PO) Take by mouth daily.    . hydrOXYzine (ATARAX/VISTARIL) 50 MG tablet Take 1 tablet (50 mg total) by mouth at bedtime. 30 tablet 5  . lidocaine (LIDODERM) 5 % Place 1 patch onto the skin daily. Remove & Discard patch within 12 hours or as  directed by MD 30 patch 0  . omeprazole (PRILOSEC) 40 MG capsule TAKE ONE CAPSULE BY MOUTH EVERY DAY (Patient taking differently: Take 40 mg by mouth daily.) 30 capsule 6  . oxyCODONE (ROXICODONE) 15 MG immediate release tablet TAKE ONE TABLET BY MOUTH 4 TIMES DAILY 120 tablet 0  . Potassium 99 MG TABS Take 297 mg by mouth daily as needed.     . predniSONE (DELTASONE) 20 MG tablet Take 3 tablets (60 mg total) by mouth daily with breakfast for 3 days, THEN 2 tablets (40 mg total) daily with breakfast for 3 days, THEN 1 tablet (20 mg total) daily with breakfast for 3 days. 18 tablet 0  . pregabalin (LYRICA) 75 MG capsule Take 1 capsule (75 mg total) by mouth 2 (two) times daily. 60 capsule 3  . propranolol ER (INDERAL LA) 60 MG 24 hr capsule Take 1 capsule (60 mg total) by mouth daily. 30 capsule 3  . rosuvastatin (CRESTOR) 5 MG tablet TAKE ONE TABLET BY MOUTH AT BEDTIME 90 tablet 2  . sertraline (ZOLOFT) 100 MG tablet TAKE ONE TABLET BY MOUTH AT BEDTIME 30 tablet 3  . tamsulosin (FLOMAX) 0.4 MG CAPS capsule TAKE ONE CAPSULE BY MOUTH EVERY EVENING 30 capsule 3  . Vitamin D, Ergocalciferol, (DRISDOL) 1.25 MG (50000  UNIT) CAPS capsule TAKE ONE TABLET BY MOUTH EVERY WEEK 4 capsule 5   No current facility-administered medications on file prior to visit.   Past Medical History:  Diagnosis Date  . Acute combined systolic and diastolic CHF, NYHA class 3 ([place]) 10/08/2019  . Acute delirium 10/08/2019  . Acute exacerbation of CHF (congestive heart failure) ([place]) 10/08/2019  . Acute gout of multiple sites 07/01/2016  . Acute respiratory failure with hypoxia and hypercapnia ([place]) 10/08/2019  . Allergy   . Anxiety   . Arthritis    Gout, osteoarthritis in Back and Knees  . Cataract   . CHF (congestive heart failure) ([place])   . Chronic bronchitis   . Community acquired pneumonia 10/08/2019  . COPD (chronic obstructive pulmonary disease) ([place]) 10/08/2019  . Dementia ([place]) 10/08/2019  . Elevated troponin  10/08/2019  . Emphysema of lung ([place])   . Essential hypertension 07/03/2016  . GERD (gastroesophageal reflux disease)   . History of kidney stones   . HLD (hyperlipidemia) 10/08/2019  . Hypercapnic respiratory failure ([place]) 10/08/2019  . Hyperlipidemia   . Hypertension   . Hypertensive heart disease 10/08/2019  . Morbid obesity due to excess calories ([place]) 10/08/2019  . Narcotic overdose ([place]) 10/08/2019  . 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 NSTEMI (non-ST elevated myocardial infarction) ([place]) 10/08/2019  . Obstructive sleep apnea   . Occlusion and stenosis of carotid artery without mention of cerebral infarction 12/13/2011  . OSA on CPAP 10/08/2019  . Pain in joint of left shoulder 01/12/2019  . Paranoia (psychosis) ([place]) 07/01/2016  . Poorly-controlled hypertension 10/08/2019  . Prediabetes   . Psychotic disorder with delusions ([place]) 07/01/2016  . Respiratory failure with hypoxia and hypercapnia ([place]) 10/08/2019  . Restless leg syndrome 07/03/2016  . Rheumatoid arthritis(714.0)   . RLS (restless legs syndrome)   . Sepsis ([place]) 10/08/2019  . Sleep apnea   . Spinal stenosis   . Syncope 04/03/2019  . Tear of left rotator cuff 01/12/2019  . Toxic metabolic encephalopathy A999333  . UTI (urinary tract infection) 10/08/2019  . Vascular dementia with behavioral disturbance ([place]) 07/06/2016   Past Surgical History:  Procedure Laterality Date  . APPENDECTOMY    . CARPAL TUNNEL RELEASE    . EYE SURGERY    . INTRAVASCULAR PRESSURE WIRE/FFR STUDY N/A 10/20/2019   Procedure: INTRAVASCULAR PRESSURE WIRE/FFR STUDY;  Surgeon: Belva Crome, MD;  Location: [facility];  Service: Cardiovascular;  Laterality: N/A;  . LEFT HEART CATH AND CORONARY ANGIOGRAPHY N/A 10/20/2019   Procedure: LEFT HEART CATH AND CORONARY ANGIOGRAPHY;  Surgeon: Belva Crome, MD;  Location: [facility];  Service: Cardiovascular;  Laterality: N/A;  . [place], 2003,  2012   Laminectomy X 3    Family History  Problem Relation Age of Onset  . Heart disease Mother   . Hypertension Mother    Social History   Socioeconomic History  . Marital status: Widowed    Spouse name: Not on file  . Number of children: 4  . Years of education: Not on file  . Highest education level: Not on file  Occupational History  . Not on file  Tobacco Use  . Smoking status: Current Every Day Smoker    Packs/day: 0.50    Years: 53.00    Pack years: 26.50    Last attempt to quit: 11/06/2018    Years since quitting: 2.1  . Smokeless tobacco: Current User  Types: Chew  Vaping Use  . Vaping Use: Never used  Substance and Sexual Activity  . Alcohol use: No  . Drug use: No  . Sexual activity: Not Currently  Other Topics Concern  . Not on file  Social History Narrative  . Not on file   Social Determinants of Health   Financial Resource Strain: Not on file  Food Insecurity: No Food Insecurity  . Worried About Charity fundraiser in the Last Year: Never true  . Ran Out of Food in the Last Year: Never true  Transportation Needs: Not on file  Physical Activity: Not on file  Stress: Not on file  Social Connections: Not on file    Review of Systems  Constitutional: Negative for chills, diaphoresis, fatigue and fever.  HENT: Negative for congestion, ear pain and sore throat.   Respiratory: Negative for cough and shortness of breath.   Cardiovascular: Negative for chest pain and leg swelling.  Gastrointestinal: Negative for abdominal pain, constipation, diarrhea, nausea and vomiting.  Genitourinary: Negative for dysuria and urgency.  Musculoskeletal: Negative for arthralgias and myalgias.  Neurological: Negative for dizziness and headaches.  Psychiatric/Behavioral: Negative for dysphoric mood.     Objective:  There were no vitals taken for this visit.  BP/Weight 12/13/2020 11/29/2020 02/40/9735  Systolic BP 329 924 268  Diastolic BP 76 74 69  Wt. (Lbs)  289 292 292  BMI 41.47 41.9 40.73    Physical Exam Vitals reviewed.  Constitutional:      Appearance: Normal appearance.  Cardiovascular:     Rate and Rhythm: Normal rate and regular rhythm.  Pulmonary:     Effort: Pulmonary effort is normal.     Breath sounds: Normal breath sounds.  Abdominal:     General: Bowel sounds are normal.  Musculoskeletal:        General: Normal range of motion.     Cervical back: Normal range of motion.  Skin:    General: Skin is warm.  Neurological:     Mental Status: He is alert.  Psychiatric:        Mood and Affect: Mood normal.        Behavior: Behavior normal.     Diabetic Foot Exam - Simple   No data filed      Lab Results  Component Value Date   WBC 10.7 (H) 09/01/2020   HGB 9.8 (L) 09/01/2020   HCT 34.7 (L) 09/01/2020   PLT 294 09/01/2020   GLUCOSE 86 09/01/2020   CHOL 167 08/15/2020   TRIG 145 08/15/2020   HDL 42 08/15/2020   LDLCALC 99 08/15/2020   ALT 15 09/01/2020   AST 17 09/01/2020   NA 139 09/01/2020   K 4.0 09/01/2020   CL 100 09/01/2020   CREATININE 0.73 09/01/2020   BUN 18 09/01/2020   CO2 28 09/01/2020   TSH 2.360 05/02/2020   INR 0.9 08/30/2020   HGBA1C 5.5 08/15/2020      Assessment & Plan:   There are no diagnoses linked to this encounter.   No orders of the defined types were placed in this encounter.   No orders of the defined types were placed in this encounter.     I spent < time > minutes dedicated to the care of this patient on the date of this encounter to include face-to-face time with the patient, as well as: ***  Follow-up: No follow-ups on file.  An After Visit Summary was printed and given to the patient.  Rochel Brome, MD [facility] [phone]

## 2020-12-23 ENCOUNTER — Telehealth: Payer: Medicare Other

## 2020-12-23 NOTE — Chronic Care Management (AMB) (Deleted)
Chronic Care Management Pharmacy  Name: Shawn Osborn  MRN: [id number] DOB: 01-18-1951  Chief Complaint/ HPI  Shawn Osborn,  70 y.o. , male presents for their Follow-Up CCM visit with the clinical pharmacist via telephone due to COVID-19 Pandemic.  PCP : Rochel Brome, MD  Their chronic conditions include: HTN, NSTEMI, CAD, COPD, Alzheimer's disease, Vascular dementia, Gout, HLD, RLS.  Office Visits: 12/13/2020 - COPD exacerbation - cefdinir, prednisone and zithromax rx given. Kenalog and rocephin injection.  11/29/2020 - COPD exacerbation. Prednisone and mucinex. Chest x-ray.  11/07/2020 - zithromax and tessalon perles for COPD.  Consult Visit: 03/17/2020 - Cardiologist results from echo showed heart is not relaxing as much as it should 02/09/2020 - no med changes. BP well controlled. 11/16/2019 - Goal cholesterol ldl <70. Patient managed at Crestor 5 but consider alternative or increased dose if not at goal of <70. Patient has CAD. BP meds ok for now monitor for additional syncope or hypotension.  10/20/2019 - Ordered catherization, advised weight loss and 30 day heart monitor.  Medications: Outpatient Encounter Medications as of 12/23/2020  Medication Sig  . albuterol (PROVENTIL) (2.5 MG/3ML) 0.083% nebulizer solution Take 3 mLs (2.5 mg total) by nebulization every 6 (six) hours as needed for wheezing or shortness of breath.  Marland Kitchen albuterol (VENTOLIN HFA) 108 (90 Base) MCG/ACT inhaler Inhale 2 puffs into the lungs every 6 (six) hours as needed for wheezing or shortness of breath.  . allopurinol (ZYLOPRIM) 300 MG tablet Take 1 tablet (300 mg total) by mouth daily.  . Ascorbic Acid (VITAMIN C) 500 MG CAPS Take 500 mg by mouth daily.  Marland Kitchen azithromycin (ZITHROMAX) 250 MG tablet 2 pills on day 1, one pill daily for then following 4 days  . BREZTRI AEROSPHERE 160-9-4.8 MCG/ACT AERO Inhale 2 puffs into the lungs 2 (two) times daily.  . cefdinir (OMNICEF) 300 MG capsule Take 1 capsule  (300 mg total) by mouth 2 (two) times daily.  . colchicine 0.6 MG tablet Take 1 tablet (0.6 mg total) by mouth daily. (Patient taking differently: Take 0.6 mg by mouth daily as needed (gout flares).)  . diclofenac Sodium (VOLTAREN) 1 % GEL Apply 2 g topically 4 (four) times daily as needed.  . docusate sodium (COLACE) 100 MG capsule Take 100 mg by mouth daily as needed for mild constipation.  Marland Kitchen donepezil (ARICEPT) 10 MG tablet TAKE ONE TABLET BY MOUTH AT BEDTIME  . furosemide (LASIX) 20 MG tablet Take 1 tablet (20 mg total) by mouth daily as needed.  . Glucosamine-Chondroit-Vit C-Mn (GLUCOSAMINE CHONDR 1500 COMPLX PO) Take by mouth daily.  . hydrOXYzine (ATARAX/VISTARIL) 50 MG tablet Take 1 tablet (50 mg total) by mouth at bedtime.  . lidocaine (LIDODERM) 5 % Place 1 patch onto the skin daily. Remove & Discard patch within 12 hours or as directed by MD  . omeprazole (PRILOSEC) 40 MG capsule TAKE ONE CAPSULE BY MOUTH EVERY DAY (Patient taking differently: Take 40 mg by mouth daily.)  . oxyCODONE (ROXICODONE) 15 MG immediate release tablet TAKE ONE TABLET BY MOUTH 4 TIMES DAILY  . Potassium 99 MG TABS Take 297 mg by mouth daily as needed.   . pregabalin (LYRICA) 75 MG capsule Take 1 capsule (75 mg total) by mouth 2 (two) times daily.  . propranolol ER (INDERAL LA) 60 MG 24 hr capsule Take 1 capsule (60 mg total) by mouth daily.  . rosuvastatin (CRESTOR) 5 MG tablet TAKE ONE TABLET BY MOUTH AT BEDTIME  . sertraline (ZOLOFT)  100 MG tablet TAKE ONE TABLET BY MOUTH AT BEDTIME  . tamsulosin (FLOMAX) 0.4 MG CAPS capsule TAKE ONE CAPSULE BY MOUTH EVERY EVENING  . Vitamin D, Ergocalciferol, (DRISDOL) 1.25 MG (50000 UNIT) CAPS capsule TAKE ONE TABLET BY MOUTH EVERY WEEK   No facility-administered encounter medications on file as of 12/23/2020.   Allergies  Allergen Reactions  . Avelox [Moxifloxacin Hcl In Nacl] Swelling  . Codeine Phosphate Itching  . Cozaar     Unknown   . Cymbalta [Duloxetine Hcl]      Twitching  . Ropinirole     Weakness, dizziness, felt sick  . Statins     myalgia  . Welchol [Colesevelam Hcl]   . Penicillins Rash    Did it involve swelling of the face/tongue/throat, SOB, or low BP? No Did it involve sudden or severe rash/hives, skin peeling, or any reaction on the inside of your mouth or nose? Yes Did you need to seek medical attention at a hospital or doctor's office? Yes When did it last happen?15 + years If all above answers are "NO", may proceed with cephalosporin use.    SDOH Screenings   Alcohol Screen: Not on file  Depression (PHQ2-9): Low Risk   . PHQ-2 Score: 0  Financial Resource Strain: Not on file  Food Insecurity: No Food Insecurity  . Worried About Charity fundraiser in the Last Year: Never true  . Ran Out of Food in the Last Year: Never true  Housing: Not on file  Physical Activity: Not on file  Social Connections: Not on file  Stress: Not on file  Tobacco Use: High Risk  . Smoking Tobacco Use: Current Every Day Smoker  . Smokeless Tobacco Use: Current User  Transportation Needs: Not on file     Current Diagnosis/Assessment:  Goals Addressed   None     COPD / Asthma / Tobacco    Eosinophil count:  No results found for: EOSPCT%                               Eos (Absolute):  Lab Results  Component Value Date/Time   EOSABS 0.2 08/15/2020 04:36 PM    Tobacco Status:  Social History   Tobacco Use  Smoking Status Current Every Day Smoker  . Packs/day: 0.50  . Years: 53.00  . Pack years: 26.50  . Last attempt to quit: 11/06/2018  . Years since quitting: 2.1  Smokeless Tobacco Current User  . Types: Chew    Patient has failed these meds in past: Symbicort, duoneb, performist, nasonex Patient is currently controlled on the following medications: Breztri and albuterol Using maintenance inhaler regularly? Yes Frequency of rescue inhaler use:  1-2x per week  We discussed:  Patient is having difficulty affording  the [place]. Pharmacist is following up with the mail order pharmacy supplying this medication.   Update 06/08/2020 - Patient reports that the inhaler is helping his breathing. He received his inhalers through Patient Assistance. He reports that he does an extra puff of Breztri if he has trouble breathing through the night. Pharmacist discussed the importance of using Breztri as prescribed 2 puffs twice daily and using albuterol if shortness of breath between doses as a rescue inhaler.   Plan  Continue current medications ,  Hypertension   BP today is:  <140/90  Office blood pressures are  BP Readings from Last 3 Encounters:  12/13/20 140/76  11/29/20 (!) 142/74  11/07/20 130/69    Patient has failed these meds in the past:atenolol, clonidine, isosorbide, lisinopril, metoprolol, valsartan, spironolactone Patient is currently controlledon the following medications: furosemide prn  Patient checks BP at home daily  Patient home BP readings are ranging: 130/80 mmHg  We discussed diet and exercise extensively. Patient has lost some weight. Was 325 lbs and last week weighed 278 lbs. Has cut back on salt. Has cut back on drinks. Also cut back eating pork and organ meat.   Update 06/08/2020 - Patient reports good control at home.   Plan  Continue current medications   Hyperlipidemia   Lipid Panel     Component Value Date/Time   CHOL 167 08/15/2020 1636   TRIG 145 08/15/2020 1636   HDL 42 08/15/2020 1636   CHOLHDL 4.0 08/15/2020 1636   LDLCALC 99 08/15/2020 1636   LABVLDL 26 08/15/2020 1636     The ASCVD Risk score (Goff DC Jr., et al., 2013) failed to calculate for the following reasons:   The patient has a prior MI or stroke diagnosis   Patient has failed these meds in past: n/a Patient is currently controlled on the following medications: Rosuvastatin  We discussed:  diet and exercise extensively. Patient has done a great job with weight loss and diet since  a hospitalization a few months ago. He indicates he rarely drinks soft drinks any more and watches what he eats. He does some of his therapy exercises at home for mobility and stays active outdoors.   Update 06/08/2020 - Recommend patient take medication as prescribed and strive for a low fat diet.   Plan  Continue current medications     and  Gout    Patient has failed these meds in past: n/a Patient is currently controlled on the following medications: allopurinol 300 mg daily, colchicine 0.6 mg daily prn  We discussed:  Patient reports if he avoids drinks, liver and pork, he has very few issues with gout. Patient has colchicine at home if needed for future flares.   Plan  Continue current medications   Alzheimer's Disease   Patient has failed these meds in past: n/a Patient is currently controlled on the following medications: donepezil 10 mg daily  We discussed:  Patient is pleased with current regimen.   Update 06/09/2020 - Patient reports that he is concerned about his memory. Pharmacist recommended he come in for an appointment with PCP to discuss options.   Plan  Continue current medications and follow-up with PCP about memory   Health Maintenance   Patient is currently controlled on the following medications:  Potassium OTC - cramps Stool softener prn - constipation Sertraline 100 mg - depression Propanolol ER 60 mg daily - tremor  We discussed:  Patient reports overall health improved with weight loss.   Update 06/09/2020 - Patient reports stable on medications and continues to use packaging from local pharmacy.   Plan  Continue current medications  Vaccines   Reviewed and discussed patient's vaccination history.  Has had both COVID vaccines.   Immunization History  Administered Date(s) Administered  . Fluad Quad(high Dose 65+) 08/15/2020  . Influenza, Seasonal, Injecte, Preservative Fre 08/19/2018  . Influenza-Unspecified 08/19/2018  .  Pneumococcal Conjugate-13 08/19/2017  . Pneumococcal Polysaccharide-23 08/20/2015    Plan  Recommended patient receive annual flu vaccine in office.   Medication Management   Pt uses [facility] for all medications Uses pill box? Yes Pt endorses good compliance  We discussed: Patient is receiving blisterpacking  through his current pharmacy. He is pleased with the service and helps him take his medications appropriately.   Plan  Continue current medication management strategy    Follow up: 2 month phone visit

## 2020-12-27 DIAGNOSIS — J449 Chronic obstructive pulmonary disease, unspecified: Secondary | ICD-10-CM | POA: Diagnosis not present

## 2020-12-29 NOTE — Progress Notes (Deleted)
Subjective:  Patient ID: Shawn Osborn, male    DOB: Apr 04, 1951  Age: 70 y.o. MRN: [id number]  No chief complaint on file.   HPI Obstructive sleep apnea on cpap and oxygen at night.   Prediabetes:   Chronic respiratory failure with hypoxia, on home O2 therapy (HCC) Wears oxygen...  COPD: on breztri, ventolin hfa or albuterol nebs. Recently completed cpack and cefdinir.  Hypertensive heart disease with diastolic heart failure ([place]) - NSTEMI 09/2019. On furosemide 20 mg one daily prn.   Tremor of both hands: on propranolol  Drug induced constipation: on colace.  Idiopathic chronic gout of multiple sites without tophus: on allopurinol 300 mg once daily and colchicine prn for flare ups.   Mixed hyperlipidemia: on crestor 5 mg once daily.   Chronic midline low back pain without sciatica: Pain level:  On oxycodone, lioderm patch. and lyrica.   Uncomplicated opioid dependence ([place]) - on oxycodone 15 mg IR qid.   Depression, recurrent mild: on zoloft 100 mg I po qhs.   BPH: on flomax.   Vitamin D deficiency: Vitamin D 50000 once weekly.   GERD: on omeprazole.   Current Outpatient Medications on File Prior to Visit  Medication Sig Dispense Refill  . albuterol (PROVENTIL) (2.5 MG/3ML) 0.083% nebulizer solution Take 3 mLs (2.5 mg total) by nebulization every 6 (six) hours as needed for wheezing or shortness of breath. 120 mL 12  . albuterol (VENTOLIN HFA) 108 (90 Base) MCG/ACT inhaler Inhale 2 puffs into the lungs every 6 (six) hours as needed for wheezing or shortness of breath. 8 g 3  . allopurinol (ZYLOPRIM) 300 MG tablet Take 1 tablet (300 mg total) by mouth daily. 30 tablet 5  . Ascorbic Acid (VITAMIN C) 500 MG CAPS Take 500 mg by mouth daily.    Marland Kitchen azithromycin (ZITHROMAX) 250 MG tablet 2 pills on day 1, one pill daily for then following 4 days 6 tablet 0  . BREZTRI AEROSPHERE 160-9-4.8 MCG/ACT AERO Inhale 2 puffs into the lungs 2 (two) times daily. 10.7 g 2  . cefdinir  (OMNICEF) 300 MG capsule Take 1 capsule (300 mg total) by mouth 2 (two) times daily. 20 capsule 0  . colchicine 0.6 MG tablet Take 1 tablet (0.6 mg total) by mouth daily. (Patient taking differently: Take 0.6 mg by mouth daily as needed (gout flares).) 14 tablet 3  . diclofenac Sodium (VOLTAREN) 1 % GEL Apply 2 g topically 4 (four) times daily as needed. 50 g 0  . docusate sodium (COLACE) 100 MG capsule Take 100 mg by mouth daily as needed for mild constipation.    Marland Kitchen donepezil (ARICEPT) 10 MG tablet TAKE ONE TABLET BY MOUTH AT BEDTIME 30 tablet 3  . furosemide (LASIX) 20 MG tablet Take 1 tablet (20 mg total) by mouth daily as needed. 30 tablet 3  . Glucosamine-Chondroit-Vit C-Mn (GLUCOSAMINE CHONDR 1500 COMPLX PO) Take by mouth daily.    . hydrOXYzine (ATARAX/VISTARIL) 50 MG tablet Take 1 tablet (50 mg total) by mouth at bedtime. 30 tablet 5  . lidocaine (LIDODERM) 5 % Place 1 patch onto the skin daily. Remove & Discard patch within 12 hours or as directed by MD 30 patch 0  . omeprazole (PRILOSEC) 40 MG capsule TAKE ONE CAPSULE BY MOUTH EVERY DAY (Patient taking differently: Take 40 mg by mouth daily.) 30 capsule 6  . oxyCODONE (ROXICODONE) 15 MG immediate release tablet TAKE ONE TABLET BY MOUTH 4 TIMES DAILY 120 tablet 0  . Potassium 99  MG TABS Take 297 mg by mouth daily as needed.     . pregabalin (LYRICA) 75 MG capsule Take 1 capsule (75 mg total) by mouth 2 (two) times daily. 60 capsule 3  . propranolol ER (INDERAL LA) 60 MG 24 hr capsule Take 1 capsule (60 mg total) by mouth daily. 30 capsule 3  . rosuvastatin (CRESTOR) 5 MG tablet TAKE ONE TABLET BY MOUTH AT BEDTIME 90 tablet 2  . sertraline (ZOLOFT) 100 MG tablet TAKE ONE TABLET BY MOUTH AT BEDTIME 30 tablet 3  . tamsulosin (FLOMAX) 0.4 MG CAPS capsule TAKE ONE CAPSULE BY MOUTH EVERY EVENING 30 capsule 3  . Vitamin D, Ergocalciferol, (DRISDOL) 1.25 MG (50000 UNIT) CAPS capsule TAKE ONE TABLET BY MOUTH EVERY WEEK 4 capsule 5   No current  facility-administered medications on file prior to visit.   Past Medical History:  Diagnosis Date  . Acute combined systolic and diastolic CHF, NYHA class 3 ([place]) 10/08/2019  . Acute delirium 10/08/2019  . Acute exacerbation of CHF (congestive heart failure) ([place]) 10/08/2019  . Acute gout of multiple sites 07/01/2016  . Acute respiratory failure with hypoxia and hypercapnia ([place]) 10/08/2019  . Allergy   . Anxiety   . Arthritis    Gout, osteoarthritis in Back and Knees  . Cataract   . CHF (congestive heart failure) ([place])   . Chronic bronchitis   . Community acquired pneumonia 10/08/2019  . COPD (chronic obstructive pulmonary disease) ([place]) 10/08/2019  . Dementia ([place]) 10/08/2019  . Elevated troponin 10/08/2019  . Emphysema of lung ([place])   . Essential hypertension 07/03/2016  . GERD (gastroesophageal reflux disease)   . History of kidney stones   . HLD (hyperlipidemia) 10/08/2019  . Hypercapnic respiratory failure ([place]) 10/08/2019  . Hyperlipidemia   . Hypertension   . Hypertensive heart disease 10/08/2019  . Morbid obesity due to excess calories ([place]) 10/08/2019  . Narcotic overdose ([place]) 10/08/2019  . 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 NSTEMI (non-ST elevated myocardial infarction) ([place]) 10/08/2019  . Obstructive sleep apnea   . Occlusion and stenosis of carotid artery without mention of cerebral infarction 12/13/2011  . OSA on CPAP 10/08/2019  . Pain in joint of left shoulder 01/12/2019  . Paranoia (psychosis) ([place]) 07/01/2016  . Poorly-controlled hypertension 10/08/2019  . Prediabetes   . Psychotic disorder with delusions ([place]) 07/01/2016  . Respiratory failure with hypoxia and hypercapnia ([place]) 10/08/2019  . Restless leg syndrome 07/03/2016  . Rheumatoid arthritis(714.0)   . RLS (restless legs syndrome)   . Sepsis ([place]) 10/08/2019  . Sleep apnea   . Spinal stenosis   . Syncope 04/03/2019  . Tear of left rotator cuff 01/12/2019  . Toxic metabolic  encephalopathy 47/65/4650  . UTI (urinary tract infection) 10/08/2019  . Vascular dementia with behavioral disturbance ([place]) 07/06/2016   Past Surgical History:  Procedure Laterality Date  . APPENDECTOMY    . CARPAL TUNNEL RELEASE    . EYE SURGERY    . INTRAVASCULAR PRESSURE WIRE/FFR STUDY N/A 10/20/2019   Procedure: INTRAVASCULAR PRESSURE WIRE/FFR STUDY;  Surgeon: Belva Crome, MD;  Location: [facility];  Service: Cardiovascular;  Laterality: N/A;  . LEFT HEART CATH AND CORONARY ANGIOGRAPHY N/A 10/20/2019   Procedure: LEFT HEART CATH AND CORONARY ANGIOGRAPHY;  Surgeon: Belva Crome, MD;  Location: [facility];  Service: Cardiovascular;  Laterality: N/A;  . [place], 2003, 2012   Laminectomy X 3  Family History  Problem Relation Age of Onset  . Heart disease Mother   . Hypertension Mother    Social History   Socioeconomic History  . Marital status: Widowed    Spouse name: Not on file  . Number of children: 4  . Years of education: Not on file  . Highest education level: Not on file  Occupational History  . Not on file  Tobacco Use  . Smoking status: Current Every Day Smoker    Packs/day: 0.50    Years: 53.00    Pack years: 26.50    Last attempt to quit: 11/06/2018    Years since quitting: 2.1  . Smokeless tobacco: Current User    Types: Chew  Vaping Use  . Vaping Use: Never used  Substance and Sexual Activity  . Alcohol use: No  . Drug use: No  . Sexual activity: Not Currently  Other Topics Concern  . Not on file  Social History Narrative  . Not on file   Social Determinants of Health   Financial Resource Strain: Not on file  Food Insecurity: No Food Insecurity  . Worried About Charity fundraiser in the Last Year: Never true  . Ran Out of Food in the Last Year: Never true  Transportation Needs: Not on file  Physical Activity: Not on file  Stress: Not on file  Social Connections: Not on file    Review of  Systems   Objective:  There were no vitals taken for this visit.  BP/Weight 12/13/2020 11/29/2020 05/39/7673  Systolic BP 419 379 024  Diastolic BP 76 74 69  Wt. (Lbs) 289 292 292  BMI 41.47 41.9 40.73    Physical Exam  Diabetic Foot Exam - Simple   No data filed      Lab Results  Component Value Date   WBC 10.7 (H) 09/01/2020   HGB 9.8 (L) 09/01/2020   HCT 34.7 (L) 09/01/2020   PLT 294 09/01/2020   GLUCOSE 86 09/01/2020   CHOL 167 08/15/2020   TRIG 145 08/15/2020   HDL 42 08/15/2020   LDLCALC 99 08/15/2020   ALT 15 09/01/2020   AST 17 09/01/2020   NA 139 09/01/2020   K 4.0 09/01/2020   CL 100 09/01/2020   CREATININE 0.73 09/01/2020   BUN 18 09/01/2020   CO2 28 09/01/2020   TSH 2.360 05/02/2020   INR 0.9 08/30/2020   HGBA1C 5.5 08/15/2020      Assessment & Plan:   1. Obstructive sleep apnea  2. Prediabetes  3. Chronic respiratory failure with hypoxia, on home O2 therapy ([place])  4. Hypertensive heart disease with diastolic heart failure ([place])  5. Tremor of both hands  6. Drug induced constipation  7. Idiopathic chronic gout of multiple sites without tophus  8. Mixed hyperlipidemia  9. Chronic midline low back pain without sciatica  10. Uncomplicated opioid dependence ([place])  11. Mixed simple and mucopurulent chronic bronchitis ([employer])  12. Gastroesophageal reflux disease without esophagitis  13. Benign prostatic hyperplasia without lower urinary tract symptoms  14. Vitamin D deficiency    No orders of the defined types were placed in this encounter.   No orders of the defined types were placed in this encounter.     I spent < time > minutes dedicated to the care of this patient on the date of this encounter to include face-to-face time with the patient, as well as: ***  Follow-up: No follow-ups on file.  An After Visit Summary was  printed and given to the patient.  Rochel Brome, MD [facility] [phone]

## 2020-12-30 ENCOUNTER — Ambulatory Visit: Payer: Medicare Other | Admitting: Family Medicine

## 2020-12-30 DIAGNOSIS — E559 Vitamin D deficiency, unspecified: Secondary | ICD-10-CM

## 2020-12-30 DIAGNOSIS — J418 Mixed simple and mucopurulent chronic bronchitis: Secondary | ICD-10-CM

## 2020-12-30 DIAGNOSIS — Z9981 Dependence on supplemental oxygen: Secondary | ICD-10-CM

## 2020-12-30 DIAGNOSIS — I110 Hypertensive heart disease with heart failure: Secondary | ICD-10-CM

## 2020-12-30 DIAGNOSIS — R251 Tremor, unspecified: Secondary | ICD-10-CM

## 2020-12-30 DIAGNOSIS — N400 Enlarged prostate without lower urinary tract symptoms: Secondary | ICD-10-CM

## 2020-12-30 DIAGNOSIS — M1A09X0 Idiopathic chronic gout, multiple sites, without tophus (tophi): Secondary | ICD-10-CM

## 2020-12-30 DIAGNOSIS — K5903 Drug induced constipation: Secondary | ICD-10-CM

## 2020-12-30 DIAGNOSIS — F1120 Opioid dependence, uncomplicated: Secondary | ICD-10-CM

## 2020-12-30 DIAGNOSIS — G4733 Obstructive sleep apnea (adult) (pediatric): Secondary | ICD-10-CM

## 2020-12-30 DIAGNOSIS — K219 Gastro-esophageal reflux disease without esophagitis: Secondary | ICD-10-CM

## 2020-12-30 DIAGNOSIS — M545 Low back pain, unspecified: Secondary | ICD-10-CM

## 2020-12-30 DIAGNOSIS — E782 Mixed hyperlipidemia: Secondary | ICD-10-CM

## 2020-12-30 DIAGNOSIS — R7303 Prediabetes: Secondary | ICD-10-CM

## 2021-01-03 ENCOUNTER — Other Ambulatory Visit: Payer: Self-pay | Admitting: Family Medicine
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
## 2021-01-06 ENCOUNTER — Other Ambulatory Visit: Payer: Self-pay

## 2021-01-06 ENCOUNTER — Ambulatory Visit (INDEPENDENT_AMBULATORY_CARE_PROVIDER_SITE_OTHER): Payer: Medicare Other

## 2021-01-06 DIAGNOSIS — J441 Chronic obstructive pulmonary disease with (acute) exacerbation: Secondary | ICD-10-CM

## 2021-01-06 DIAGNOSIS — I110 Hypertensive heart disease with heart failure: Secondary | ICD-10-CM | POA: Diagnosis not present

## 2021-01-06 DIAGNOSIS — I5030 Unspecified diastolic (congestive) heart failure: Secondary | ICD-10-CM

## 2021-01-06 DIAGNOSIS — F17219 Nicotine dependence, cigarettes, with unspecified nicotine-induced disorders: Secondary | ICD-10-CM

## 2021-01-06 NOTE — Patient Instructions (Signed)
Visit Information  Goals Addressed            This Visit's Progress   . Learn More About My Health       Timeframe:  Long-Range Goal Priority:  High Start Date:                 01/06/2021            Expected End Date:  01/06/2022                      Follow Up Date 06/29/2021    - make a list of questions - repeat what I heard to make sure I understand - bring a list of my medicines to the visit    Why is this important?    The best way to learn about your health and care is by talking to the doctor and nurse.   They will answer your questions and give you information in the way that you like best.    Notes:     Marland Kitchen Manage My Medicine       Timeframe:  Long-Range Goal Priority:  High Start Date:              01/06/2021               Expected End Date:01/06/2022                       Follow Up Date 06/29/2021    - call for medicine refill 2 or 3 days before it runs out - keep a list of all the medicines I take; vitamins and herbals too - use a pillbox to sort medicine    Why is this important?   . These steps will help you keep on track with your medicines.   Notes:     . Track and Manage Symptoms-Heart Failure       Timeframe:  Long-Range Goal Priority:  High Start Date:                     01/06/2021        Expected End Date:     01/06/2022                  Follow Up Date 06/29/2021    - eat more whole grains, fruits and vegetables, lean meats and healthy fats - know when to call the doctor    Why is this important?    You will be able to handle your symptoms better if you keep track of them.   Making some simple changes to your lifestyle will help.   Eating healthy is one thing you can do to take good care of yourself.    Notes:       Patient Care Plan: CCM Pharmacy Care Plan    Problem Identified: COPD, HTN, CHF, HLD   Priority: High  Onset Date: 01/06/2021    Long-Range Goal: Disease Management   Start Date: 01/06/2021  Expected End Date:  01/06/2022  This Visit's Progress: On track  Priority: High  Note:    Current Barriers:  . Unable to independently afford treatment regimen   Pharmacist Clinical Goal(s):  Marland Kitchen Over the next 90 days, patient will verbalize ability to afford treatment regimen through collaboration with PharmD and provider.   Interventions: . 1:1 collaboration with Rochel Brome, MD regarding development and update of comprehensive plan of care as evidenced  by provider attestation and co-signature . Inter-disciplinary care team collaboration (see longitudinal plan of care) . Comprehensive medication review performed; medication list updated in electronic medical record  Tobacco use (Goal smoking cessation) -uncontrolled -Current treatment  . None reported -Patient smokes Within 30 minutes of waking -Patient triggers include: craving the taste of a cigarette -On a scale of 1-10, reports MOTIVATION to quit is 0 -On a scale of 1-10, reports CONFIDENCE in quitting is 0 -Previous quit attempts: Patient reports having tried to quit in the past unsuccessfully. Patient states it is so hard. Has reduced from 3 packs to 1 pack currently.  -Provided contact information for  Quit Line (1-800-QUIT-NOW) and encouraged patient to reach out to this group for support. -Discussed benefit of smoking cessation and encouraged patient to consider smoking cessation again. Congratulated patient on reduction to 1 pack daily.    Hyperlipidemia: (LDL goal < 55) -uncontrolled -Current treatment: . Crestor 5 mg daily  -Medications previously tried: none reported -Current dietary patterns: states he is eating too much lately.  -Current exercise habits: not exercising. Discussed option of walking in driveway for activity.  -Educated on Cholesterol goals;  Benefits of statin for ASCVD risk reduction; Importance of limiting foods high in cholesterol; Exercise goal of 150 minutes per week; -Counseled on diet and exercise  extensively Recommended working on heart healthy diet.   Heart Failure (Goal: control symptoms and prevent exacerbations) controlled Type: Hypertensive Heart disease with heart failure -Ejection fraction: 45-50% (Date: 03/16/2020) -Current treatment:  furosemide 20 mg daily prn   Propanolol er 60 mg daily  -Medications previously tried: atenolol, clonidine, isosorbide, lisinopril, metoprolol, valsartan, spironolactone -Current home BP/HR readings: none reported but denies swelling or falls -Current dietary habits: eating too much but doesn't salt food per patient.  -Current exercise routine: none currently -Educated on Importance of weighing daily; if you gain more than 3 pounds in one day or 5 pounds in one week, recommended walking with walker for activity.  Importance of blood pressure control -Counseled on diet and exercise extensively Recommended to continue current medication Encouraged patient to reschedule recent missed fasting visit.   COPD (Goal: control symptoms and prevent exacerbations) -controlled -Current treatment  . albuterol solution every 6 hours prn wheezing/shortness of breath . Ventolin inhaler prn every 6 hours shorrtness of breath  . Breztri 2 puffs bid  -Medications previously tried: brovana, symbicort, duoneb, performist, nasonex, yupelri -Pulmonary function testing: not available in chart -Exacerbations requiring treatment in last 6 months: yes -Patient reports consistent use of maintenance inhaler -Frequency of rescue inhaler use: daily  -Counseled on Proper inhaler technique; Benefits of consistent maintenance inhaler use Differences between maintenance and rescue inhalers -Recommended to continue current medication Assessed patient's current hoarseness on the phone. Patient denies shortness of breath. STates he will monitor symptoms and plans to reschedule chronic visit with Dr. Tobie Poet early nex tweek.   Patient Goals/Self-Care Activities . Over the  next 90 days, patient will:  - take medications as prescribed Continue using Breztri maintenance inhaler daily.   Follow Up Plan: Telephone follow up appointment with care management team member scheduled for: 06/2021      The patient verbalized understanding of instructions, educational materials, and care plan provided today and declined offer to receive copy of patient instructions, educational materials, and care plan.  Telephone follow up appointment with pharmacy team member scheduled for: 06/2021  Shawn Osborn, [facility]  Steps to Quit Smoking Smoking tobacco is the leading cause of preventable death. It  can affect almost every organ in the body. Smoking puts you and people around you at risk for many serious, long-lasting (chronic) diseases. Quitting smoking can be hard, but it is one of the best things that you can do for your health. It is never too late to quit. How do I get ready to quit? When you decide to quit smoking, make a plan to help you succeed. Before you quit:  Pick a date to quit. Set a date within the next 2 weeks to give you time to prepare.  Write down the reasons why you are quitting. Keep this list in places where you will see it often.  Tell your family, friends, and co-workers that you are quitting. Their support is important.  Talk with your doctor about the choices that may help you quit.  Find out if your health insurance will pay for these treatments.  Know the people, places, things, and activities that make you want to smoke (triggers). Avoid them. What first steps can I take to quit smoking?  Throw away all cigarettes at home, at work, and in your car.  Throw away the things that you use when you smoke, such as ashtrays and lighters.  Clean your car. Make sure to empty the ashtray.  Clean your home, including curtains and carpets. What can I do to help me quit smoking? Talk with your doctor about taking medicines and seeing a counselor at the  same time. You are more likely to succeed when you do both.  If you are pregnant or breastfeeding, talk with your doctor about counseling or other ways to quit smoking. Do not take medicine to help you quit smoking unless your doctor tells you to do so. To quit smoking: Quit right away  Quit smoking totally, instead of slowly cutting back on how much you smoke over a period of time.  Go to counseling. You are more likely to quit if you go to counseling sessions regularly. Take medicine You may take medicines to help you quit. Some medicines need a prescription, and some you can buy over-the-counter. Some medicines may contain a drug called nicotine to replace the nicotine in cigarettes. Medicines may:  Help you to stop having the desire to smoke (cravings).  Help to stop the problems that come when you stop smoking (withdrawal symptoms). Your doctor may ask you to use:  Nicotine patches, gum, or lozenges.  Nicotine inhalers or sprays.  Non-nicotine medicine that is taken by mouth. Find resources Find resources and other ways to help you quit smoking and remain smoke-free after you quit. These resources are most helpful when you use them often. They include:  Online chats with a Social worker.  Phone quitlines.  Printed Furniture conservator/restorer.  Support groups or group counseling.  Text messaging programs.  Mobile phone apps. Use apps on your mobile phone or tablet that can help you stick to your quit plan. There are many free apps for mobile phones and tablets as well as websites. Examples include Quit Guide from the [employer] and smokefree.gov   What things can I do to make it easier to quit?  Talk to your family and friends. Ask them to support and encourage you.  Call a phone quitline (1-800-QUIT-NOW), reach out to support groups, or work with a Social worker.  Ask people who smoke to not smoke around you.  Avoid places that make you want to smoke, such  as: ? Bars. ? Parties. ? Smoke-break areas at work.  Spend  time with people who do not smoke.  Lower the stress in your life. Stress can make you want to smoke. Try these things to help your stress: ? Getting regular exercise. ? Doing deep-breathing exercises. ? Doing yoga. ? Meditating. ? Doing a body scan. To do this, close your eyes, focus on one area of your body at a time from head to toe. Notice which parts of your body are tense. Try to relax the muscles in those areas.   How will I feel when I quit smoking? Day 1 to 3 weeks Within the first 24 hours, you may start to have some problems that come from quitting tobacco. These problems are very bad 2-3 days after you quit, but they do not often last for more than 2-3 weeks. You may get these symptoms:  Mood swings.  Feeling restless, nervous, angry, or annoyed.  Trouble concentrating.  Dizziness.  Strong desire for high-sugar foods and nicotine.  Weight gain.  Trouble pooping (constipation).  Feeling like you may vomit (nausea).  Coughing or a sore throat.  Changes in how the medicines that you take for other issues work in your body.  Depression.  Trouble sleeping (insomnia). Week 3 and afterward After the first 2-3 weeks of quitting, you may start to notice more positive results, such as:  Better sense of smell and taste.  Less coughing and sore throat.  Slower heart rate.  Lower blood pressure.  Clearer skin.  Better breathing.  Fewer sick days. Quitting smoking can be hard. Do not give up if you fail the first time. Some people need to try a few times before they succeed. Do your best to stick to your quit plan, and talk with your doctor if you have any questions or concerns. Summary  Smoking tobacco is the leading cause of preventable death. Quitting smoking can be hard, but it is one of the best things that you can do for your health.  When you decide to quit smoking, make a plan to help you  succeed.  Quit smoking right away, not slowly over a period of time.  When you start quitting, seek help from your doctor, family, or friends. This information is not intended to replace advice given to you by your health care provider. Make sure you discuss any questions you have with your health care provider. Document Revised: 07/31/2019 Document Reviewed: 01/24/2019 Elsevier Patient Education  [employer].

## 2021-01-24 DIAGNOSIS — J449 Chronic obstructive pulmonary disease, unspecified: Secondary | ICD-10-CM | POA: Diagnosis not present

## 2021-01-30 ENCOUNTER — Other Ambulatory Visit: Payer: Self-pay | Admitting: Family Medicine

## 2021-01-30 DIAGNOSIS — M1A09X0 Idiopathic chronic gout, multiple sites, without tophus (tophi): Secondary | ICD-10-CM

## 2021-01-30 DIAGNOSIS — R351 Nocturia: Secondary | ICD-10-CM

## 2021-01-30 DIAGNOSIS — I2510 Atherosclerotic heart disease of native coronary artery without angina pectoris: Secondary | ICD-10-CM

## 2021-02-02 ENCOUNTER — Other Ambulatory Visit: Payer: Self-pay

## 2021-02-02 MED ORDER — OXYCODONE HCL 15 MG PO TABS: 15.0000 mg | ORAL_TABLET | Freq: Four times a day (QID) | ORAL | 0 refills | Status: DC

## 2021-02-02 NOTE — Progress Notes (Addendum)
Subjective:  Patient ID: Shawn Osborn, male    DOB: [birth year]  Age: 70 y.o. MRN: [id number]  Chief Complaint  Patient presents with  . Hyperlipidemia  . Hypertension    HPI COPD with acute exacerbation ([place]) On breztri 2 puffs twice a day. Using albuterol nebulizers twice a day.   Hypertensive heart disease with diastolic heart failure/ CAD. Taking lasix, propranolol ER 60 mg once daily. Still has a tremor  LHC: 10/2019:  Intermediate stenosis between 4 and 85% in the mid LAD.  Physiologic testing with DFR was negative for reduced flow with values of 0.9 and 0.91 (threshold for ischemia is 0.89 and below).  Widely patent left main  Widely patent LAD other than as noted above  Widely patent circumflex coronary artery  Luminal irregularities in a dominant right coronary.  Mid PDA contains an eccentric 70% stenosis.  Normal left ventricular wall motion with EF 50%.  EDP 22 mmHg.  RECOMMENDATIONS:   Aggressive preventive therapy given the patient's documented coronary artery disease.  Anti-ischemic therapy as clinically indicated.  If develops angina, the LAD could be treated relatively safely.  ECHO: 02/18/2020. Proximal IV septum is akinetic. Left ventricular ejection fraction, by estimation, is 45 to 50%. The left ventricle has mildly decreased function. The left ventricle has no regional wall motion abnormalities. There is mild left ventricular hypertrophy. Left ventricular diastolic parameters are consistent with Grade I diastolic dysfunction (impaired relaxation).  Obstructive sleep apnea: does not have cpap. Last sleep study 10 years ago.  Poor sleep: unable to go to sleep due to pain in legs. Wears oxygen at 2.5 L at night. Sometimes during the day if taking a nap.   Chronic Back Pain and OA of knees and hips. On oxycodone 15 mg one qid,   Mixed hyperlipidemia Takes crestor 5 mg once daily.   Gout is pretty well controlled on allopurinol. Only taking  colchicine if flare up. Needs refill of colchicine.   Current Outpatient Medications on File Prior to Visit  Medication Sig Dispense Refill  . albuterol (PROVENTIL) (2.5 MG/3ML) 0.083% nebulizer solution Take 3 mLs (2.5 mg total) by nebulization every 6 (six) hours as needed for wheezing or shortness of breath. 120 mL 12  . albuterol (VENTOLIN HFA) 108 (90 Base) MCG/ACT inhaler Inhale 2 puffs into the lungs every 6 (six) hours as needed for wheezing or shortness of breath. 8 g 3  . allopurinol (ZYLOPRIM) 300 MG tablet TAKE ONE TABLET BY MOUTH EVERY DAY 30 tablet 1  . Ascorbic Acid (VITAMIN C) 500 MG CAPS Take 500 mg by mouth daily.    Marland Kitchen BREZTRI AEROSPHERE 160-9-4.8 MCG/ACT AERO Inhale 2 puffs into the lungs 2 (two) times daily. 10.7 g 1  . diclofenac Sodium (VOLTAREN) 1 % GEL Apply 2 g topically 4 (four) times daily as needed. 50 g 0  . docusate sodium (COLACE) 100 MG capsule Take 100 mg by mouth daily as needed for mild constipation.    Marland Kitchen donepezil (ARICEPT) 10 MG tablet TAKE ONE TABLET BY MOUTH AT BEDTIME 30 tablet 3  . furosemide (LASIX) 20 MG tablet Take 1 tablet (20 mg total) by mouth daily as needed. 30 tablet 3  . Glucosamine-Chondroit-Vit C-Mn (GLUCOSAMINE CHONDR 1500 COMPLX PO) Take by mouth daily.    . hydrOXYzine (ATARAX/VISTARIL) 50 MG tablet Take 1 tablet (50 mg total) by mouth at bedtime. 30 tablet 5  . lidocaine (LIDODERM) 5 % Place 1 patch onto the skin daily. Remove & Discard patch  within 12 hours or as directed by MD 30 patch 0  . omeprazole (PRILOSEC) 40 MG capsule Take 1 capsule (40 mg total) by mouth daily. 30 capsule 1  . oxyCODONE (ROXICODONE) 15 MG immediate release tablet Take 1 tablet (15 mg total) by mouth 4 (four) times daily. 120 tablet 0  . Potassium 99 MG TABS Take 297 mg by mouth daily as needed.     . pregabalin (LYRICA) 75 MG capsule Take 1 capsule (75 mg total) by mouth 2 (two) times daily. 60 capsule 3  . propranolol ER (INDERAL LA) 60 MG 24 hr capsule TAKE ONE  CAPSULE BY MOUTH EVERY DAY 30 capsule 1  . rosuvastatin (CRESTOR) 5 MG tablet TAKE ONE TABLET BY MOUTH AT BEDTIME 90 tablet 2  . sertraline (ZOLOFT) 100 MG tablet TAKE ONE TABLET BY MOUTH AT BEDTIME 30 tablet 3  . tamsulosin (FLOMAX) 0.4 MG CAPS capsule TAKE ONE CAPSULE BY MOUTH EVERY EVENING 30 capsule 1  . Vitamin D, Ergocalciferol, (DRISDOL) 1.25 MG (50000 UNIT) CAPS capsule TAKE ONE TABLET BY MOUTH EVERY WEEK 4 capsule 5   No current facility-administered medications on file prior to visit.   Past Medical History:  Diagnosis Date  . Acute combined systolic and diastolic CHF, NYHA class 3 ([place]) 10/08/2019  . Acute delirium 10/08/2019  . Acute exacerbation of CHF (congestive heart failure) ([place]) 10/08/2019  . Acute gout of multiple sites 07/01/2016  . Acute respiratory failure with hypoxia and hypercapnia ([place]) 10/08/2019  . Allergy   . Anxiety   . Arthritis    Gout, osteoarthritis in Back and Knees  . Cataract   . CHF (congestive heart failure) ([place])   . Chronic bronchitis   . Community acquired pneumonia 10/08/2019  . COPD (chronic obstructive pulmonary disease) ([place]) 10/08/2019  . Dementia ([place]) 10/08/2019  . Elevated troponin 10/08/2019  . Emphysema of lung ([place])   . Essential hypertension 07/03/2016  . GERD (gastroesophageal reflux disease)   . History of kidney stones   . HLD (hyperlipidemia) 10/08/2019  . Hypercapnic respiratory failure ([place]) 10/08/2019  . Hyperlipidemia   . Hypertension   . Hypertensive heart disease 10/08/2019  . Morbid obesity due to excess calories ([place]) 10/08/2019  . Narcotic overdose ([place]) 10/08/2019  . 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 NSTEMI (non-ST elevated myocardial infarction) ([place]) 10/08/2019  . Obstructive sleep apnea   . Occlusion and stenosis of carotid artery without mention of cerebral infarction 12/13/2011  . OSA on CPAP 10/08/2019  . Pain in joint of left shoulder 01/12/2019  . Paranoia (psychosis) ([place])  07/01/2016  . Poorly-controlled hypertension 10/08/2019  . Prediabetes   . Psychotic disorder with delusions ([place]) 07/01/2016  . Respiratory failure with hypoxia and hypercapnia ([place]) 10/08/2019  . Restless leg syndrome 07/03/2016  . Rheumatoid arthritis(714.0)   . RLS (restless legs syndrome)   . Sepsis ([place]) 10/08/2019  . Sleep apnea   . Spinal stenosis   . Syncope 04/03/2019  . Tear of left rotator cuff 01/12/2019  . Toxic metabolic encephalopathy 05/14/9484  . UTI (urinary tract infection) 10/08/2019  . Vascular dementia with behavioral disturbance ([place]) 07/06/2016   Past Surgical History:  Procedure Laterality Date  . APPENDECTOMY    . CARPAL TUNNEL RELEASE    . EYE SURGERY    . INTRAVASCULAR PRESSURE WIRE/FFR STUDY N/A 10/20/2019   Procedure: INTRAVASCULAR PRESSURE WIRE/FFR STUDY;  Surgeon: Belva Crome, MD;  Location: [facility];  Service:  Cardiovascular;  Laterality: N/A;  . LEFT HEART CATH AND CORONARY ANGIOGRAPHY N/A 10/20/2019   Procedure: LEFT HEART CATH AND CORONARY ANGIOGRAPHY;  Surgeon: Belva Crome, MD;  Location: [facility];  Service: Cardiovascular;  Laterality: N/A;  . [place], 2003, 2012   Laminectomy X 3    Family History  Problem Relation Age of Onset  . Heart disease Mother   . Hypertension Mother    Social History   Socioeconomic History  . Marital status: Widowed    Spouse name: Not on file  . Number of children: 4  . Years of education: Not on file  . Highest education level: Not on file  Occupational History  . Not on file  Tobacco Use  . Smoking status: Current Every Day Smoker    Packs/day: 0.75    Years: 53.00    Pack years: 39.75    Types: Cigarettes  . Smokeless tobacco: Current User    Types: Chew  Vaping Use  . Vaping Use: Never used  Substance and Sexual Activity  . Alcohol use: No  . Drug use: No  . Sexual activity: Not Currently  Other Topics Concern  . Not on file  Social History Narrative  .  Not on file   Social Determinants of Health   Financial Resource Strain: Not on file  Food Insecurity: No Food Insecurity  . Worried About Charity fundraiser in the Last Year: Never true  . Ran Out of Food in the Last Year: Never true  Transportation Needs: Unmet Transportation Needs  . Lack of Transportation (Medical): Yes  . Lack of Transportation (Non-Medical): Yes  Physical Activity: Not on file  Stress: Not on file  Social Connections: Not on file    Review of Systems  Constitutional: Positive for fatigue. Negative for chills, diaphoresis and fever.       Night sweats  HENT: Positive for congestion. Negative for ear pain and sore throat.   Respiratory: Positive for cough, shortness of breath (continued congestion.) and wheezing.   Cardiovascular: Negative for chest pain and leg swelling.  Gastrointestinal: Negative for abdominal pain, constipation, diarrhea, nausea and vomiting.  Genitourinary: Negative for dysuria and urgency.  Musculoskeletal: Positive for arthralgias (bilateral hip pain) and back pain. Negative for myalgias.  Neurological: Positive for headaches. Negative for dizziness.  Psychiatric/Behavioral: Positive for sleep disturbance (unable to go to sleep. ). Negative for dysphoric mood. The patient is not nervous/anxious.      Objective:  BP 124/78   Pulse 60   Temp (!) 97 F (36.1 C)   Resp 20   Ht 5\' 10"  (1.778 m)   Wt 279 lb (126.6 kg)   BMI 40.03 kg/m   BP/Weight 02/06/2021 12/13/2020 0/35/0093  Systolic BP 818 299 371  Diastolic BP 78 76 74  Wt. (Lbs) 279 289 292  BMI 40.03 41.47 41.9    Physical Exam Vitals reviewed.  Constitutional:      Appearance: Normal appearance. He is obese.  HENT:     Right Ear: Tympanic membrane normal.     Left Ear: Tympanic membrane normal.     Nose: Congestion present.     Comments: Sinus tenderness    Mouth/Throat:     Pharynx: Posterior oropharyngeal erythema present. No oropharyngeal exudate.   Cardiovascular:     Rate and Rhythm: Normal rate and regular rhythm.     Heart sounds: No murmur heard.   Pulmonary:     Effort: Pulmonary effort  is normal.     Breath sounds: Wheezing present.  Abdominal:     General: Abdomen is flat. Bowel sounds are normal.     Palpations: Abdomen is soft.     Tenderness: There is no abdominal tenderness.  Neurological:     Mental Status: He is alert and oriented to person, place, and time.  Psychiatric:        Mood and Affect: Mood normal.        Behavior: Behavior normal.     Diabetic Foot Exam - Simple   No data filed      Lab Results  Component Value Date   WBC 13.0 (H) 02/06/2021   HGB 11.9 (L) 02/06/2021   HCT 40.5 02/06/2021   PLT 294 02/06/2021   GLUCOSE 77 02/06/2021   CHOL 125 02/06/2021   TRIG 109 02/06/2021   HDL 47 02/06/2021   LDLCALC 58 02/06/2021   ALT 14 02/06/2021   AST 16 02/06/2021   NA 142 02/06/2021   K 5.1 02/06/2021   CL 98 02/06/2021   CREATININE 0.73 (L) 02/06/2021   BUN 16 02/06/2021   CO2 28 02/06/2021   TSH 3.740 02/06/2021   INR 0.9 08/30/2020   HGBA1C 5.5 02/06/2021      Assessment & Plan:   1. COPD with acute exacerbation ([place]) Rx: prednisone taper, cefdnir, zithromycin.  Kenalog and rocephin shot given. - predniSONE (DELTASONE) 20 MG tablet; Take 3 tablets (60 mg total) by mouth daily with breakfast for 3 days, THEN 2 tablets (40 mg total) daily with breakfast for 3 days, THEN 1 tablet (20 mg total) daily with breakfast for 3 days.  Dispense: 18 tablet; Refill: 0 - triamcinolone acetonide (KENALOG-40) injection 80 mg - cefTRIAXone (ROCEPHIN) injection 1 g - cefdinir (OMNICEF) 300 MG capsule; Take 1 capsule (300 mg total) by mouth 2 (two) times daily.  Dispense: 20 capsule; Refill: 0 - azithromycin (ZITHROMAX) 250 MG tablet; 2 tablets day 1, then one tablet daily x 4 days  Dispense: 6 each; Refill: 0  2. Hypertensive heart disease with chronic combined systolic and diastolic congestive  heart failure ([place]) Well controlled. CHF compensated. No changes to medicines.  Continue to work on eating a healthy diet and exercise.  Labs drawn today.  - CBC with Differential/Platelet - Comprehensive metabolic panel  3. Obstructive sleep apnea Continue to wear cpap.   4. Mixed hyperlipidemia Not at goal . Intolerant to statins. Continue to work on eating a healthy diet and exercise.  Labs drawn today.  - Lipid panel  5. Idiopathic neuropathy  Increase lyrica 75 mg 2 twice a day.  Substitute with gralise 300 mg one three times a day when we get it prior authorized through insurance.  - Gabapentin, Once-Daily, (GRALISE) 300 MG TABS; Take 300 mg by mouth in the morning, at noon, and at bedtime.  Dispense: 90 tablet; Refill: 2 - Methylmalonic acid, serum - B12 and Folate Panel - TSH  6. Prediabetes Recommend continue to work on eating healthy diet and exercise. - Hemoglobin A1c  7. Coronary artery disease involving native coronary artery of native heart without angina pectoris Recommend repatha.   8. Chronic respiratory failure with hypoxia (HCC)  Continue oxygen 2 L pnc.  9. Morbid obesity with bmi 39 with serious comorbidities Recommend continue to work on eating healthy diet and exercise.  10. Drug induced myopathy: intolerant to statins.   Meds ordered this encounter  Medications  . Gabapentin, Once-Daily, (GRALISE) 300 MG TABS  Sig: Take 300 mg by mouth in the morning, at noon, and at bedtime.    Dispense:  90 tablet    Refill:  2    Replace lyrica with gralise once we get it prior authorized.  . predniSONE (DELTASONE) 20 MG tablet    Sig: Take 3 tablets (60 mg total) by mouth daily with breakfast for 3 days, THEN 2 tablets (40 mg total) daily with breakfast for 3 days, THEN 1 tablet (20 mg total) daily with breakfast for 3 days.    Dispense:  18 tablet    Refill:  0  . triamcinolone acetonide (KENALOG-40) injection 80 mg  . cefTRIAXone (ROCEPHIN) injection  1 g  . cefdinir (OMNICEF) 300 MG capsule    Sig: Take 1 capsule (300 mg total) by mouth 2 (two) times daily.    Dispense:  20 capsule    Refill:  0  . azithromycin (ZITHROMAX) 250 MG tablet    Sig: 2 tablets day 1, then one tablet daily x 4 days    Dispense:  6 each    Refill:  0  . colchicine 0.6 MG tablet    Sig: One twice daily for 1 week IF has gout flare.    Dispense:  14 tablet    Refill:  2  . triamcinolone (KENALOG) 0.1 %    Sig: Apply 1 application topically 2 (two) times daily as needed (to ears for itching).    Dispense:  30 g    Refill:  2    Orders Placed This Encounter  Procedures  . CBC with Differential/Platelet  . Comprehensive metabolic panel  . Lipid panel  . Hemoglobin A1c  . Methylmalonic acid, serum  . B12 and Folate Panel  . TSH  . Cardiovascular Risk Assessment    Follow-up: Return in about 6 weeks (around 03/20/2021).  An After Visit Summary was printed and given to the patient.  Rochel Brome, MD [facility] [phone]

## 2021-02-06 ENCOUNTER — Other Ambulatory Visit: Payer: Self-pay

## 2021-02-06 ENCOUNTER — Encounter: Payer: Self-pay | Admitting: Family Medicine

## 2021-02-06 ENCOUNTER — Ambulatory Visit (INDEPENDENT_AMBULATORY_CARE_PROVIDER_SITE_OTHER): Payer: Medicare Other | Admitting: Family Medicine

## 2021-02-06 VITALS — BP 124/78 | HR 60 | Temp 97.0°F | Resp 20 | Ht 70.0 in | Wt 279.0 lb

## 2021-02-06 DIAGNOSIS — R7303 Prediabetes: Secondary | ICD-10-CM | POA: Diagnosis not present

## 2021-02-06 DIAGNOSIS — J9611 Chronic respiratory failure with hypoxia: Secondary | ICD-10-CM | POA: Diagnosis not present

## 2021-02-06 DIAGNOSIS — I2510 Atherosclerotic heart disease of native coronary artery without angina pectoris: Secondary | ICD-10-CM

## 2021-02-06 DIAGNOSIS — I110 Hypertensive heart disease with heart failure: Secondary | ICD-10-CM

## 2021-02-06 DIAGNOSIS — I5042 Chronic combined systolic (congestive) and diastolic (congestive) heart failure: Secondary | ICD-10-CM

## 2021-02-06 DIAGNOSIS — E782 Mixed hyperlipidemia: Secondary | ICD-10-CM | POA: Diagnosis not present

## 2021-02-06 DIAGNOSIS — G4733 Obstructive sleep apnea (adult) (pediatric): Secondary | ICD-10-CM | POA: Diagnosis not present

## 2021-02-06 DIAGNOSIS — J441 Chronic obstructive pulmonary disease with (acute) exacerbation: Secondary | ICD-10-CM | POA: Diagnosis not present

## 2021-02-06 DIAGNOSIS — G720 Drug-induced myopathy: Secondary | ICD-10-CM

## 2021-02-06 DIAGNOSIS — G609 Hereditary and idiopathic neuropathy, unspecified: Secondary | ICD-10-CM

## 2021-02-06 DIAGNOSIS — I5030 Unspecified diastolic (congestive) heart failure: Secondary | ICD-10-CM | POA: Diagnosis not present

## 2021-02-06 MED ORDER — TRIAMCINOLONE ACETONIDE 40 MG/ML IJ SUSP
80.0000 mg | Freq: Once | INTRAMUSCULAR | Status: AC
  Administered 2021-02-06: 80 mg via INTRAMUSCULAR

## 2021-02-06 MED ORDER — AZITHROMYCIN 250 MG PO TABS: ORAL_TABLET | ORAL | 0 refills | Status: DC

## 2021-02-06 MED ORDER — TRIAMCINOLONE ACETONIDE 0.1 % EX CREA: 1.0000 "application " | TOPICAL_CREAM | Freq: Two times a day (BID) | CUTANEOUS | 2 refills | Status: DC | PRN

## 2021-02-06 MED ORDER — COLCHICINE 0.6 MG PO TABS: ORAL_TABLET | ORAL | 2 refills | Status: DC

## 2021-02-06 MED ORDER — CEFTRIAXONE SODIUM 1 G IJ SOLR
1.0000 g | Freq: Once | INTRAMUSCULAR | Status: AC
  Administered 2021-02-06: 1 g via INTRAMUSCULAR

## 2021-02-06 MED ORDER — CEFDINIR 300 MG PO CAPS: 300.0000 mg | ORAL_CAPSULE | Freq: Two times a day (BID) | ORAL | 0 refills | Status: DC

## 2021-02-06 MED ORDER — GRALISE 300 MG PO TABS
300.0000 mg | ORAL_TABLET | Freq: Three times a day (TID) | ORAL | 2 refills | Status: DC
Start: 2021-02-06 — End: 2021-03-10

## 2021-02-06 MED ORDER — PREDNISONE 20 MG PO TABS: ORAL_TABLET | ORAL | 0 refills | Status: AC

## 2021-02-06 NOTE — Patient Instructions (Addendum)
Neuropathy Increase lyrica 75 mg 2 twice a day.  Substitute with gralise 300 mg one three times a day when we get it prior authorized through insurance.   COPD Flare: Prescriptions: Cefdinir zithromax Prednisone   Gout: Colchicine  Itchy ears: Triamcinalone cream.

## 2021-02-08 ENCOUNTER — Other Ambulatory Visit: Payer: Self-pay | Admitting: Family Medicine

## 2021-02-08 LAB — COMPREHENSIVE METABOLIC PANEL
ALT: 14 IU/L (ref 0–44)
AST: 16 IU/L (ref 0–40)
Albumin/Globulin Ratio: 1.5 (ref 1.2–2.2)
Albumin: 4.1 g/dL (ref 3.8–4.8)
Alkaline Phosphatase: 86 IU/L (ref 44–121)
BUN/Creatinine Ratio: 22 (ref 10–24)
BUN: 16 mg/dL (ref 8–27)
Bilirubin Total: 0.2 mg/dL (ref 0.0–1.2)
CO2: 28 mmol/L (ref 20–29)
Calcium: 9.5 mg/dL (ref 8.6–10.2)
Chloride: 98 mmol/L (ref 96–106)
Creatinine, Ser: 0.73 mg/dL — ABNORMAL LOW (ref 0.76–1.27)
Globulin, Total: 2.7 g/dL (ref 1.5–4.5)
Glucose: 77 mg/dL (ref 65–99)
Potassium: 5.1 mmol/L (ref 3.5–5.2)
Sodium: 142 mmol/L (ref 134–144)
Total Protein: 6.8 g/dL (ref 6.0–8.5)
eGFR: 98 mL/min/{1.73_m2} (ref >59)

## 2021-02-08 LAB — CBC WITH DIFFERENTIAL/PLATELET
Basophils Absolute: 0.1 10*3/uL (ref 0.0–0.2)
Basos: 1 %
EOS (ABSOLUTE): 0.1 10*3/uL (ref 0.0–0.4)
Eos: 1 %
Hematocrit: 40.5 % (ref 37.5–51.0)
Hemoglobin: 11.9 g/dL — ABNORMAL LOW (ref 13.0–17.7)
Immature Grans (Abs): 0 10*3/uL (ref 0.0–0.1)
Immature Granulocytes: 0 %
Lymphocytes Absolute: 3.4 10*3/uL — ABNORMAL HIGH (ref 0.7–3.1)
Lymphs: 26 %
MCH: 22.8 pg — ABNORMAL LOW (ref 26.6–33.0)
MCHC: 29.4 g/dL — ABNORMAL LOW (ref 31.5–35.7)
MCV: 77 fL — ABNORMAL LOW (ref 79–97)
Monocytes Absolute: 0.8 10*3/uL (ref 0.1–0.9)
Monocytes: 6 %
Neutrophils Absolute: 8.5 10*3/uL — ABNORMAL HIGH (ref 1.4–7.0)
Neutrophils: 66 %
Platelets: 294 10*3/uL (ref 150–450)
RBC: 5.23 x10E6/uL (ref 4.14–5.80)
RDW: 20.4 % — ABNORMAL HIGH (ref 11.6–15.4)
WBC: 13 10*3/uL — ABNORMAL HIGH (ref 3.4–10.8)

## 2021-02-08 LAB — LIPID PANEL
Chol/HDL Ratio: 2.7 ratio (ref 0.0–5.0)
Cholesterol, Total: 125 mg/dL (ref 100–199)
HDL: 47 mg/dL (ref >39)
LDL Chol Calc (NIH): 58 mg/dL (ref 0–99)
Triglycerides: 109 mg/dL (ref 0–149)
VLDL Cholesterol Cal: 20 mg/dL (ref 5–40)

## 2021-02-08 LAB — B12 AND FOLATE PANEL
Folate: 9.7 ng/mL (ref >3.0)
Vitamin B-12: 491 pg/mL (ref 232–1245)

## 2021-02-08 LAB — METHYLMALONIC ACID, SERUM: Methylmalonic Acid: 245 nmol/L (ref 0–378)

## 2021-02-08 LAB — HEMOGLOBIN A1C
Est. average glucose Bld gHb Est-mCnc: 111 mg/dL
Hgb A1c MFr Bld: 5.5 % (ref 4.8–5.6)

## 2021-02-08 LAB — TSH: TSH: 3.74 u[IU]/mL (ref 0.450–4.500)

## 2021-02-08 LAB — CARDIOVASCULAR RISK ASSESSMENT

## 2021-02-24 DIAGNOSIS — J449 Chronic obstructive pulmonary disease, unspecified: Secondary | ICD-10-CM | POA: Diagnosis not present

## 2021-02-28 ENCOUNTER — Telehealth: Payer: Self-pay

## 2021-02-28 NOTE — Telephone Encounter (Signed)
Repatha was denied for not meeting criteria.

## 2021-03-06 ENCOUNTER — Telehealth: Payer: Self-pay

## 2021-03-06 ENCOUNTER — Other Ambulatory Visit: Payer: Self-pay | Admitting: Family Medicine
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
## 2021-03-09 ENCOUNTER — Telehealth: Payer: Self-pay

## 2021-03-09 NOTE — Telephone Encounter (Cosign Needed)
  Chronic Care Management   Note  03/09/2021 Name: Shawn Osborn MRN: [id number] DOB: [birth year]   Pharmacist submitted prior authorization request for [place]. Prior authorization was denied. Patient is not eligible per insurance due to LDL <70. Pharmacist completed [employer] grant for copay assistance in the even that patient's LDL is >70 mg/dL in the future.   Sherre Poot, PharmD, [facility] Clinical Pharmacist [facility] [phone] (office) [phone] (mobile)

## 2021-03-10 ENCOUNTER — Other Ambulatory Visit: Payer: Self-pay

## 2021-03-10 MED ORDER — PREGABALIN 75 MG PO CAPS: 75.0000 mg | ORAL_CAPSULE | Freq: Three times a day (TID) | ORAL | 0 refills | Status: DC

## 2021-03-20 ENCOUNTER — Ambulatory Visit: Payer: Medicare Other | Admitting: Family Medicine

## 2021-03-23 ENCOUNTER — Other Ambulatory Visit: Payer: Self-pay

## 2021-03-23 MED ORDER — FUROSEMIDE 20 MG PO TABS: 20.0000 mg | ORAL_TABLET | Freq: Every day | ORAL | 3 refills | Status: DC | PRN

## 2021-03-26 DIAGNOSIS — J449 Chronic obstructive pulmonary disease, unspecified: Secondary | ICD-10-CM | POA: Diagnosis not present

## 2021-03-27 ENCOUNTER — Ambulatory Visit (INDEPENDENT_AMBULATORY_CARE_PROVIDER_SITE_OTHER): Payer: Medicare Other | Admitting: Family Medicine

## 2021-03-27 ENCOUNTER — Encounter: Payer: Self-pay | Admitting: Family Medicine

## 2021-03-27 ENCOUNTER — Other Ambulatory Visit: Payer: Self-pay

## 2021-03-27 VITALS — BP 138/64 | HR 64 | Temp 95.6°F | Resp 18 | Ht 70.0 in | Wt 282.0 lb

## 2021-03-27 DIAGNOSIS — E782 Mixed hyperlipidemia: Secondary | ICD-10-CM | POA: Diagnosis not present

## 2021-03-27 DIAGNOSIS — G308 Other Alzheimer's disease: Secondary | ICD-10-CM

## 2021-03-27 DIAGNOSIS — R7303 Prediabetes: Secondary | ICD-10-CM

## 2021-03-27 DIAGNOSIS — M1A09X0 Idiopathic chronic gout, multiple sites, without tophus (tophi): Secondary | ICD-10-CM

## 2021-03-27 DIAGNOSIS — Z79891 Long term (current) use of opiate analgesic: Secondary | ICD-10-CM

## 2021-03-27 DIAGNOSIS — F17219 Nicotine dependence, cigarettes, with unspecified nicotine-induced disorders: Secondary | ICD-10-CM

## 2021-03-27 DIAGNOSIS — I2510 Atherosclerotic heart disease of native coronary artery without angina pectoris: Secondary | ICD-10-CM

## 2021-03-27 DIAGNOSIS — J9611 Chronic respiratory failure with hypoxia: Secondary | ICD-10-CM

## 2021-03-27 DIAGNOSIS — G609 Hereditary and idiopathic neuropathy, unspecified: Secondary | ICD-10-CM | POA: Diagnosis not present

## 2021-03-27 DIAGNOSIS — G4733 Obstructive sleep apnea (adult) (pediatric): Secondary | ICD-10-CM

## 2021-03-27 DIAGNOSIS — G894 Chronic pain syndrome: Secondary | ICD-10-CM

## 2021-03-27 DIAGNOSIS — F0281 Dementia in other diseases classified elsewhere with behavioral disturbance: Secondary | ICD-10-CM

## 2021-03-27 DIAGNOSIS — Z5181 Encounter for therapeutic drug level monitoring: Secondary | ICD-10-CM | POA: Diagnosis not present

## 2021-03-27 DIAGNOSIS — F02818 Dementia in other diseases classified elsewhere, unspecified severity, with other behavioral disturbance: Secondary | ICD-10-CM

## 2021-03-27 MED ORDER — MEMANTINE HCL 10 MG PO TABS: 10.0000 mg | ORAL_TABLET | Freq: Two times a day (BID) | ORAL | 3 refills | Status: DC

## 2021-03-27 MED ORDER — ALLOPURINOL 300 MG PO TABS: 300.0000 mg | ORAL_TABLET | Freq: Every day | ORAL | 3 refills | Status: DC

## 2021-03-27 MED ORDER — QUETIAPINE FUMARATE 50 MG PO TABS: 50.0000 mg | ORAL_TABLET | Freq: Every day | ORAL | 0 refills | Status: DC

## 2021-03-27 NOTE — Patient Instructions (Addendum)
Insomnia: Seroquel 50 mg once at bedtime.  Memory loss: start on namenda 10 mg one twice a dy.  Call eye doctor to make an appointment.  I will review previous imaging studies (knee xrays, mri of brain.)     I, Draken Farrior, understand that I have pain that has not been adequately controlled with other medications and that my function is limited by my pain. I understand that the intent of the medication is to increase my ability to do more, though the medication is unlikely to eliminate the pain. 1. ____ I will take the medication only as prescribed. I will not take any sedatives, alcohol or other pain medications without the prior approval of my doctor. 2. ____ I understand that the medication will be prescribed only by Dr. Tobie Poet and only according to the agreed-upon schedule. Prescriptions will be provided only during regularly scheduled appointments. Refills will never be provided by telephone. 3. ____ I will not seek or accept any medications for pain other than those prescribed by my doctor. "Medications for pain" includes prescriptions from other doctors, medications borrowed or accepted from family or friends, and any illicit or street drugs.  4. ____ Medication refills will be provided by e prescription. No refills can be given on narcotics.  Patient is required to call either their pharmacy or [place] for refills 48 hours prior to needing them.  5. ____ If I do not keep my appointment, I will not receive a refill. Two (2) appointment cancellations with less than one working day's notice or two (2) no-show appointments may constitute grounds for immediate termination of this agreement. 6._____ I understand that my doctor is under no obligation to provide these medications to me, and that she or he reserves the right to discontinue these medications at any time.  7._____ At my doctor's discretion, I agree to cooperate with random drug testing, which may be requested at any time. If I refuse,  I understand the medication will be stopped. 8. ____ I understand that lost or stolen medications will not be refilled under any circumstances. It is my responsibility to protect and secure any medications. This includes keeping the medication out of reach of children. A copy of a police report will be required for any lost or stolen narcotics or narcotic prescriptions. 9._____ I understand that my doctor may require specialist evaluation of my treatment, and I agree to keep appointments when my physician refers me. My doctor will send a report of my care and a copy of this agreement when a referral is made. 59. _____ In addition to the above agreements, I accept the right of my doctor's medical staff to terminate this agreement for any of the following reasons: 1. I seek or obtain any pain medication from a source other than my doctor. 2. I give, sell or in any way distribute prescribed medications to any other person(s). 3. I in any way attempt to forge or alter a prescription. 4. My medical condition declines to the point at which, in the judgment of my doctor, continued therapy with this medication presents a danger to my well-being or safety. 5. There is evidence that I am no longer receiving a reasonable therapeutic benefit from the medication, or my doctor determines that I am no longer a good candidate to continue the medication. 81. _____ I agree to fill my prescriptions only at the pharmacy I list below. If I change pharmacies, I will contact my doctor's office and provide them with  the name, address and phone number of the new pharmacy. Under no circumstances will I obtain medications from more than one pharmacy at a time. In order to verify appropriate medication use, my doctor's office will provide my chosen pharmacy with a copy of this agreement. 12. _____ I understand that any alteration in my medication prescriptions will require a new written agreement. Pharmacy name: [facility]  telephone:  [phone] Pharmacy address [address] [place], [place] [address] Medication name, dose and directions  1. Oxycontin IR 15 mg one four times a day 120 pills   Frequency of appointments every NINETY days  I understand that by signing this agreement, I must abide by the rules reviewed above and that failure to abide by these agreements will result in the termination of medication prescriptions and possibly the termination of services from my doctor and his or her practice. ____________________             __ / ___/ ___      __________________     __ / ___ / ___     Patient signature    Date    Physician signature           Date  MEDICATION USE AGREEMENT

## 2021-03-27 NOTE — Progress Notes (Signed)
Subjective:  Patient ID: Shawn Osborn, male    DOB: 30-Apr-1951  Age: 70 y.o. MRN: [id number]  Chief Complaint  Patient presents with  . Hyperlipidemia  . COPD    HPI  Patient and family have noticed change in mental status. Confusion. Has been having visual hallucination and auditory hallucinations x 1-2 months. Poor sleep. Getting 3 hours at night. Sometimes awakens at night due to pain, RLS, and other times just wakes up. Hydroxyzine is not helping. Legs are weak and shaky. Eating 3 meals per day. No speech problems. Sometimes has double vision. Has been 2 years since last eye exam. Has been having more floaters.  ON aricept 10 mg once at night.   COPD: Pt is using breztri 2 puffs twice a day. Using ventolin maybe every other day. Minimal smoking (1/2 ppd)  Hyperlipidemia: on crestor 5 mg once daily at night.   Chronic pain syndrome: lumbar back pain. On oxycodone 15 mg IR one tablet four times a day. Now also having hip pain. Pain level 6/10. Last took oxycodone at 8 am.   Neuropathy: Lyrica 75 mg one three times a day and for RLS.   Hypertension with heart disease: Pt is off antihypertensives except for propranolol ER 60 mg once daily for tremor.  CAD: Report as below: LHC as follows.  Intermediate stenosis between 15 and 85% in the mid LAD.  Physiologic testing with DFR was negative for reduced flow with values of 0.9 and 0.91 (threshold for ischemia is 0.89 and below).  Widely patent left main  Widely patent LAD other than as noted above  Widely patent circumflex coronary artery  Luminal irregularities in a dominant right coronary.  Mid PDA contains an eccentric 70% stenosis.  Normal left ventricular wall motion with EF 50%.  EDP 22 mmHg. RECOMMENDATIONS: Aggressive preventive therapy given the patient's documented coronary artery disease. Anti-ischemic therapy as clinically indicated. If develops angina, the LAD could be treated relatively safely.  Vitamin D  deficiency. Taking vitamin D 50000 one weekly. .  Gout: on allopurinol 300 mg once daily.Takes colchicine prn for gout. .   Patient has mild major depression, recurrent.  He takes Zoloft 100 mg once daily.    Current Outpatient Medications on File Prior to Visit  Medication Sig Dispense Refill  . albuterol (PROVENTIL) (2.5 MG/3ML) 0.083% nebulizer solution Take 3 mLs (2.5 mg total) by nebulization every 6 (six) hours as needed for wheezing or shortness of breath. 120 mL 12  . albuterol (VENTOLIN HFA) 108 (90 Base) MCG/ACT inhaler Inhale 2 puffs into the lungs every 6 (six) hours as needed for wheezing or shortness of breath. 8 g 3  . Ascorbic Acid (VITAMIN C) 500 MG CAPS Take 500 mg by mouth daily.    Marland Kitchen BREZTRI AEROSPHERE 160-9-4.8 MCG/ACT AERO Inhale 2 puffs into the lungs 2 (two) times daily. 10.7 g 1  . colchicine 0.6 MG tablet One twice daily for 1 week IF has gout flare. 14 tablet 2  . diclofenac Sodium (VOLTAREN) 1 % GEL Apply 2 g topically 4 (four) times daily as needed. 50 g 0  . docusate sodium (COLACE) 100 MG capsule Take 100 mg by mouth daily as needed for mild constipation.    Marland Kitchen donepezil (ARICEPT) 10 MG tablet TAKE ONE TABLET BY MOUTH AT BEDTIME 30 tablet 3  . furosemide (LASIX) 20 MG tablet Take 1 tablet (20 mg total) by mouth daily as needed. 30 tablet 3  . hydrOXYzine (ATARAX/VISTARIL) 50 MG  tablet Take 1 tablet (50 mg total) by mouth at bedtime. 30 tablet 5  . omeprazole (PRILOSEC) 40 MG capsule Take 1 capsule (40 mg total) by mouth daily. 30 capsule 1  . oxyCODONE (ROXICODONE) 15 MG immediate release tablet TAKE ONE TABLET BY MOUTH 4 TIMES DAILY 120 tablet 0  . Potassium 99 MG TABS Take 297 mg by mouth daily as needed.     . pregabalin (LYRICA) 75 MG capsule Take 1 capsule (75 mg total) by mouth 3 (three) times daily. 90 capsule 0  . propranolol ER (INDERAL LA) 60 MG 24 hr capsule TAKE ONE CAPSULE BY MOUTH EVERY DAY 30 capsule 1  . rosuvastatin (CRESTOR) 5 MG tablet TAKE ONE  TABLET BY MOUTH AT BEDTIME 90 tablet 2  . sertraline (ZOLOFT) 100 MG tablet TAKE ONE TABLET BY MOUTH AT BEDTIME 30 tablet 3  . tamsulosin (FLOMAX) 0.4 MG CAPS capsule TAKE ONE CAPSULE BY MOUTH EVERY EVENING 30 capsule 1  . triamcinolone (KENALOG) 0.1 % Apply 1 application topically 2 (two) times daily as needed (to ears for itching). 30 g 2  . Vitamin D, Ergocalciferol, (DRISDOL) 1.25 MG (50000 UNIT) CAPS capsule TAKE ONE TABLET BY MOUTH EVERY WEEK 4 capsule 5   No current facility-administered medications on file prior to visit.   Past Medical History:  Diagnosis Date  . Acute combined systolic and diastolic CHF, NYHA class 3 ([place]) 10/08/2019  . Acute delirium 10/08/2019  . Acute exacerbation of CHF (congestive heart failure) ([place]) 10/08/2019  . Acute gout of multiple sites 07/01/2016  . Acute respiratory failure with hypoxia and hypercapnia ([place]) 10/08/2019  . Allergy   . Anxiety   . Arthritis    Gout, osteoarthritis in Back and Knees  . Cataract   . CHF (congestive heart failure) ([place])   . Chronic bronchitis   . Community acquired pneumonia 10/08/2019  . COPD (chronic obstructive pulmonary disease) ([place]) 10/08/2019  . Dementia ([place]) 10/08/2019  . Elevated troponin 10/08/2019  . Emphysema of lung ([place])   . Essential hypertension 07/03/2016  . GERD (gastroesophageal reflux disease)   . History of kidney stones   . HLD (hyperlipidemia) 10/08/2019  . Hypercapnic respiratory failure ([place]) 10/08/2019  . Hyperlipidemia   . Hypertension   . Hypertensive heart disease 10/08/2019  . Morbid obesity due to excess calories ([place]) 10/08/2019  . Narcotic overdose ([place]) 10/08/2019  . 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 NSTEMI (non-ST elevated myocardial infarction) ([place]) 10/08/2019  . Obstructive sleep apnea   . Occlusion and stenosis of carotid artery without mention of cerebral infarction 12/13/2011  . OSA on CPAP 10/08/2019  . Pain in joint of left shoulder  01/12/2019  . Paranoia (psychosis) ([place]) 07/01/2016  . Poorly-controlled hypertension 10/08/2019  . Prediabetes   . Psychotic disorder with delusions ([place]) 07/01/2016  . Respiratory failure with hypoxia and hypercapnia ([place]) 10/08/2019  . Restless leg syndrome 07/03/2016  . Rheumatoid arthritis(714.0)   . RLS (restless legs syndrome)   . Sepsis ([place]) 10/08/2019  . Sleep apnea   . Spinal stenosis   . Syncope 04/03/2019  . Tear of left rotator cuff 01/12/2019  . Toxic metabolic encephalopathy 54/00/8676  . UTI (urinary tract infection) 10/08/2019  . Vascular dementia with behavioral disturbance ([place]) 07/06/2016   Past Surgical History:  Procedure Laterality Date  . APPENDECTOMY    . CARPAL TUNNEL RELEASE    . EYE SURGERY    . INTRAVASCULAR PRESSURE WIRE/FFR STUDY N/A  10/20/2019   Procedure: INTRAVASCULAR PRESSURE WIRE/FFR STUDY;  Surgeon: Belva Crome, MD;  Location: [facility];  Service: Cardiovascular;  Laterality: N/A;  . LEFT HEART CATH AND CORONARY ANGIOGRAPHY N/A 10/20/2019   Procedure: LEFT HEART CATH AND CORONARY ANGIOGRAPHY;  Surgeon: Belva Crome, MD;  Location: [facility];  Service: Cardiovascular;  Laterality: N/A;  . [place], 2003, 2012   Laminectomy X 3    Family History  Problem Relation Age of Onset  . Heart disease Mother   . Hypertension Mother    Social History   Socioeconomic History  . Marital status: Widowed    Spouse name: Not on file  . Number of children: 4  . Years of education: Not on file  . Highest education level: Not on file  Occupational History  . Not on file  Tobacco Use  . Smoking status: Current Every Day Smoker    Packs/day: 0.75    Years: 53.00    Pack years: 39.75    Types: Cigarettes  . Smokeless tobacco: Current User    Types: Chew  Vaping Use  . Vaping Use: Never used  Substance and Sexual Activity  . Alcohol use: No  . Drug use: No  . Sexual activity: Not Currently  Other Topics Concern  . Not  on file  Social History Narrative  . Not on file   Social Determinants of Health   Financial Resource Strain: Not on file  Food Insecurity: Not on file  Transportation Needs: Unmet Transportation Needs  . Lack of Transportation (Medical): Yes  . Lack of Transportation (Non-Medical): Yes  Physical Activity: Not on file  Stress: Not on file  Social Connections: Not on file    Review of Systems  Constitutional: Positive for fatigue. Negative for chills, fever and unexpected weight change.  HENT: Negative for congestion, ear pain, sinus pain and sore throat.   Eyes: Positive for visual disturbance.  Cardiovascular: Positive for leg swelling. Negative for chest pain and palpitations.  Gastrointestinal: Negative for abdominal pain, constipation, diarrhea, nausea and vomiting.  Endocrine: Negative for polydipsia.  Genitourinary: Negative for dysuria.  Musculoskeletal: Positive for arthralgias and back pain.  Skin: Negative for rash.  Neurological: Negative for headaches.     Objective:  BP 138/64   Pulse 64   Temp (!) 95.6 F (35.3 C)   Resp 18   Ht 5\' 10"  (1.778 m)   Wt 282 lb (127.9 kg)   BMI 40.46 kg/m   BP/Weight 03/27/2021 02/06/2021 3/76/2831  Systolic BP 517 616 073  Diastolic BP 64 78 76  Wt. (Lbs) 282 279 289  BMI 40.46 40.03 41.47    Physical Exam Constitutional:      Appearance: Normal appearance. He is obese.  Neck:     Vascular: No carotid bruit.  Cardiovascular:     Rate and Rhythm: Normal rate and regular rhythm.     Pulses: Normal pulses.     Heart sounds: Normal heart sounds.  Pulmonary:     Effort: Pulmonary effort is normal.     Breath sounds: Normal breath sounds.  Abdominal:     General: Bowel sounds are normal.     Palpations: Abdomen is soft. There is no mass.     Tenderness: There is no abdominal tenderness.  Musculoskeletal:        General: Tenderness (lumbar. negative sciatica. ) present.  Neurological:     Mental Status: He is alert.   Psychiatric:  Mood and Affect: Mood normal.        Behavior: Behavior normal.        Thought Content: Thought content normal.     Diabetic Foot Exam - Simple   Simple Foot Form Visual Inspection No deformities, no ulcerations, no other skin breakdown bilaterally: Yes Sensation Testing See comments: Yes Pulse Check Posterior Tibialis and Dorsalis pulse intact bilaterally: Yes Comments Decreased sensation.  BL legs are very cold.  Pt is not wearing socks and shoes are in terrible shape.       Lab Results  Component Value Date   WBC 13.0 (H) 02/06/2021   HGB 11.9 (L) 02/06/2021   HCT 40.5 02/06/2021   PLT 294 02/06/2021   GLUCOSE 77 02/06/2021   CHOL 125 02/06/2021   TRIG 109 02/06/2021   HDL 47 02/06/2021   LDLCALC 58 02/06/2021   ALT 14 02/06/2021   AST 16 02/06/2021   NA 142 02/06/2021   K 5.1 02/06/2021   CL 98 02/06/2021   CREATININE 0.73 (L) 02/06/2021   BUN 16 02/06/2021   CO2 28 02/06/2021   TSH 3.740 02/06/2021   INR 0.9 08/30/2020   HGBA1C 5.5 02/06/2021      Assessment & Plan:   1. Mixed hyperlipidemia The current medical regimen is effective;  continue present plan and medications. Recommend continue to work on eating healthy diet  2. Prediabetes Recommend continue to work on eating healthy diet  3. Idiopathic neuropathy The current medical regimen is effective;  continue present plan and medications.  4. Coronary artery disease involving native coronary artery of native heart without angina pectoris The current medical regimen is effective;  continue present plan and medications.  5. Cigarette nicotine dependence with nicotine-induced disorder Recommend cessation  6. Chronic respiratory failure with hypoxia (HCC) Continue oxygen at 2L  7. Obstructive sleep apnea Recommended wear cpap  8. Idiopathic chronic gout of multiple sites without tophus Increase allopurinol to 300 mg once daily - allopurinol (ZYLOPRIM) 300 MG tablet;  Take 1 tablet (300 mg total) by mouth daily.  Dispense: 30 tablet; Refill: 3  9. Encounter for monitoring opioid maintenance therapy - Pain Mgt Scrn (14 Drugs), Ur  10. Chronic pain syndrome: due to OA knees and OA of lumbar spine.   11. Alzheimer's Dementia with behavioral disturbances (hallucinations) MRI done 2020 showed mild - moderate generalized atrophy. Start on seroquel 50 mg once a tnight.  Start on namenda 10 mg one twice daily    Meds ordered this encounter  Medications  . QUEtiapine (SEROQUEL) 50 MG tablet    Sig: Take 1 tablet (50 mg total) by mouth at bedtime.    Dispense:  90 tablet    Refill:  0  . memantine (NAMENDA) 10 MG tablet    Sig: Take 1 tablet (10 mg total) by mouth 2 (two) times daily.    Dispense:  60 tablet    Refill:  3  . allopurinol (ZYLOPRIM) 300 MG tablet    Sig: Take 1 tablet (300 mg total) by mouth daily.    Dispense:  30 tablet    Refill:  3    Orders Placed This Encounter  Procedures  . Pain Mgt Scrn (14 Drugs), Ur     Follow-up: No follow-ups on file.  An After Visit Summary was printed and given to the patient.  Rochel Brome, MD [facility] [phone]  Addendum 03/30/2021 Patient's drug screen came back abnormal. Showed suboxone, THC, and oxycodone.  I discussed  with patient and he admits to smoking THC. He does not know how suboxone got in [facility]. His son is in a suboxone clinic in [place]. I spoke with his Daughter, [name]. She and her sister, Otila Kluver, give him one week of medicine at a time to try to limit him from overtaking the pain medicines and concerned about Merry Proud taking his medicines.  I instructed pt to bring pill packs within an hour at 2 pm today. He said he would come.  Pt signed a new pain contract as of 03/27/2021 to replace his previous one. He was given a copy.   Rochel Brome, MD

## 2021-03-28 LAB — PAIN MGT SCRN (14 DRUGS), UR
Amphetamine Scrn, Ur: NEGATIVE ng/mL
BARBITURATE SCREEN URINE: NEGATIVE ng/mL
BENZODIAZEPINE SCREEN, URINE: NEGATIVE ng/mL
Buprenorphine, Urine: POSITIVE ng/mL — AB
CANNABINOIDS UR QL SCN: POSITIVE ng/mL — AB
Cocaine (Metab) Scrn, Ur: NEGATIVE ng/mL
Creatinine(Crt), U: 109.5 mg/dL (ref 20.0–300.0)
Fentanyl, Urine: NEGATIVE pg/mL
Meperidine Screen, Urine: NEGATIVE ng/mL
Methadone Screen, Urine: NEGATIVE ng/mL
OXYCODONE+OXYMORPHONE UR QL SCN: POSITIVE ng/mL — AB
Opiate Scrn, Ur: POSITIVE ng/mL — AB
Ph of Urine: 5.2 (ref 4.5–8.9)
Phencyclidine Qn, Ur: NEGATIVE ng/mL
Propoxyphene Scrn, Ur: NEGATIVE ng/mL
Tramadol Screen, Urine: NEGATIVE ng/mL

## 2021-04-03 ENCOUNTER — Encounter: Payer: Self-pay | Admitting: Family Medicine

## 2021-04-06 ENCOUNTER — Other Ambulatory Visit: Payer: Self-pay | Admitting: Family Medicine

## 2021-04-19 ENCOUNTER — Other Ambulatory Visit: Payer: Self-pay | Admitting: Family Medicine

## 2021-04-21 ENCOUNTER — Other Ambulatory Visit: Payer: Self-pay | Admitting: Family Medicine

## 2021-04-21 DIAGNOSIS — M1A09X0 Idiopathic chronic gout, multiple sites, without tophus (tophi): Secondary | ICD-10-CM

## 2021-04-21 DIAGNOSIS — R351 Nocturia: Secondary | ICD-10-CM

## 2021-04-21 DIAGNOSIS — I2510 Atherosclerotic heart disease of native coronary artery without angina pectoris: Secondary | ICD-10-CM

## 2021-04-26 DIAGNOSIS — J449 Chronic obstructive pulmonary disease, unspecified: Secondary | ICD-10-CM | POA: Diagnosis not present

## 2021-04-26 DIAGNOSIS — Z79899 Other long term (current) drug therapy: Secondary | ICD-10-CM | POA: Diagnosis not present

## 2021-04-26 DIAGNOSIS — Z765 Malingerer [conscious simulation]: Secondary | ICD-10-CM | POA: Diagnosis not present

## 2021-04-26 DIAGNOSIS — M545 Low back pain, unspecified: Secondary | ICD-10-CM | POA: Diagnosis not present

## 2021-04-26 DIAGNOSIS — E559 Vitamin D deficiency, unspecified: Secondary | ICD-10-CM | POA: Diagnosis not present

## 2021-04-26 DIAGNOSIS — F17210 Nicotine dependence, cigarettes, uncomplicated: Secondary | ICD-10-CM | POA: Diagnosis not present

## 2021-04-26 DIAGNOSIS — E785 Hyperlipidemia, unspecified: Secondary | ICD-10-CM | POA: Diagnosis not present

## 2021-04-26 DIAGNOSIS — M109 Gout, unspecified: Secondary | ICD-10-CM | POA: Diagnosis not present

## 2021-05-02 DIAGNOSIS — G8929 Other chronic pain: Secondary | ICD-10-CM | POA: Diagnosis not present

## 2021-05-02 DIAGNOSIS — M25561 Pain in right knee: Secondary | ICD-10-CM | POA: Diagnosis not present

## 2021-05-02 DIAGNOSIS — Z79899 Other long term (current) drug therapy: Secondary | ICD-10-CM | POA: Diagnosis not present

## 2021-05-02 DIAGNOSIS — M545 Low back pain, unspecified: Secondary | ICD-10-CM | POA: Diagnosis not present

## 2021-05-06 DIAGNOSIS — G309 Alzheimer's disease, unspecified: Secondary | ICD-10-CM | POA: Diagnosis not present

## 2021-05-09 DIAGNOSIS — M25561 Pain in right knee: Secondary | ICD-10-CM | POA: Diagnosis not present

## 2021-05-09 DIAGNOSIS — G8929 Other chronic pain: Secondary | ICD-10-CM | POA: Diagnosis not present

## 2021-05-09 DIAGNOSIS — M545 Low back pain, unspecified: Secondary | ICD-10-CM | POA: Diagnosis not present

## 2021-05-09 DIAGNOSIS — Z79899 Other long term (current) drug therapy: Secondary | ICD-10-CM | POA: Diagnosis not present

## 2021-05-11 DIAGNOSIS — D539 Nutritional anemia, unspecified: Secondary | ICD-10-CM | POA: Diagnosis not present

## 2021-05-11 DIAGNOSIS — R5383 Other fatigue: Secondary | ICD-10-CM | POA: Diagnosis not present

## 2021-05-11 DIAGNOSIS — G4700 Insomnia, unspecified: Secondary | ICD-10-CM | POA: Diagnosis not present

## 2021-05-11 DIAGNOSIS — Z79899 Other long term (current) drug therapy: Secondary | ICD-10-CM | POA: Diagnosis not present

## 2021-05-11 DIAGNOSIS — E119 Type 2 diabetes mellitus without complications: Secondary | ICD-10-CM | POA: Diagnosis not present

## 2021-05-15 ENCOUNTER — Other Ambulatory Visit: Payer: Self-pay

## 2021-05-15 ENCOUNTER — Other Ambulatory Visit: Payer: Self-pay | Admitting: Family Medicine

## 2021-05-15 DIAGNOSIS — I2510 Atherosclerotic heart disease of native coronary artery without angina pectoris: Secondary | ICD-10-CM

## 2021-05-15 DIAGNOSIS — R351 Nocturia: Secondary | ICD-10-CM

## 2021-05-15 DIAGNOSIS — M1A09X0 Idiopathic chronic gout, multiple sites, without tophus (tophi): Secondary | ICD-10-CM

## 2021-05-15 MED ORDER — PROPRANOLOL HCL ER 60 MG PO CP24
60.0000 mg | ORAL_CAPSULE | Freq: Every day | ORAL | 0 refills | Status: DC
Start: 2021-05-15 — End: 2021-11-16

## 2021-05-15 MED ORDER — FUROSEMIDE 20 MG PO TABS: 20.0000 mg | ORAL_TABLET | Freq: Every day | ORAL | 0 refills | Status: DC | PRN

## 2021-05-15 MED ORDER — MEMANTINE HCL 10 MG PO TABS: 10.0000 mg | ORAL_TABLET | Freq: Two times a day (BID) | ORAL | 0 refills | Status: DC

## 2021-05-15 MED ORDER — SERTRALINE HCL 100 MG PO TABS
100.0000 mg | ORAL_TABLET | Freq: Every day | ORAL | 0 refills | Status: DC
Start: 2021-05-15 — End: 2021-11-16

## 2021-05-15 MED ORDER — DONEPEZIL HCL 10 MG PO TABS: 10.0000 mg | ORAL_TABLET | Freq: Every day | ORAL | 0 refills | Status: DC

## 2021-05-15 MED ORDER — ALLOPURINOL 300 MG PO TABS: 300.0000 mg | ORAL_TABLET | Freq: Every day | ORAL | 0 refills | Status: DC

## 2021-05-15 MED ORDER — VITAMIN D (ERGOCALCIFEROL) 1.25 MG (50000 UNIT) PO CAPS: 50000.0000 [IU] | ORAL_CAPSULE | ORAL | 0 refills | Status: DC

## 2021-05-15 MED ORDER — HYDROXYZINE HCL 50 MG PO TABS: 50.0000 mg | ORAL_TABLET | Freq: Every day | ORAL | 0 refills | Status: DC

## 2021-05-15 MED ORDER — TAMSULOSIN HCL 0.4 MG PO CAPS: 0.4000 mg | ORAL_CAPSULE | Freq: Every evening | ORAL | 0 refills | Status: DC

## 2021-05-15 MED ORDER — OMEPRAZOLE 40 MG PO CPDR: 40.0000 mg | DELAYED_RELEASE_CAPSULE | Freq: Every day | ORAL | 0 refills | Status: DC

## 2021-05-15 MED ORDER — ROSUVASTATIN CALCIUM 5 MG PO TABS: 5.0000 mg | ORAL_TABLET | Freq: Every day | ORAL | 0 refills | Status: DC

## 2021-05-26 DIAGNOSIS — J449 Chronic obstructive pulmonary disease, unspecified: Secondary | ICD-10-CM | POA: Diagnosis not present

## 2021-06-06 DIAGNOSIS — M25561 Pain in right knee: Secondary | ICD-10-CM | POA: Diagnosis not present

## 2021-06-06 DIAGNOSIS — G8929 Other chronic pain: Secondary | ICD-10-CM | POA: Diagnosis not present

## 2021-06-06 DIAGNOSIS — Z79899 Other long term (current) drug therapy: Secondary | ICD-10-CM | POA: Diagnosis not present

## 2021-06-06 DIAGNOSIS — M545 Low back pain, unspecified: Secondary | ICD-10-CM | POA: Diagnosis not present

## 2021-06-08 DIAGNOSIS — Z79899 Other long term (current) drug therapy: Secondary | ICD-10-CM | POA: Diagnosis not present

## 2021-06-26 DIAGNOSIS — J449 Chronic obstructive pulmonary disease, unspecified: Secondary | ICD-10-CM | POA: Diagnosis not present

## 2021-06-27 ENCOUNTER — Telehealth: Payer: Medicare Other

## 2021-06-27 DIAGNOSIS — M25561 Pain in right knee: Secondary | ICD-10-CM | POA: Diagnosis not present

## 2021-06-27 DIAGNOSIS — M545 Low back pain, unspecified: Secondary | ICD-10-CM | POA: Diagnosis not present

## 2021-06-27 DIAGNOSIS — G8929 Other chronic pain: Secondary | ICD-10-CM | POA: Diagnosis not present

## 2021-06-27 DIAGNOSIS — Z79899 Other long term (current) drug therapy: Secondary | ICD-10-CM | POA: Diagnosis not present

## 2021-06-29 DIAGNOSIS — Z79899 Other long term (current) drug therapy: Secondary | ICD-10-CM | POA: Diagnosis not present

## 2021-07-04 ENCOUNTER — Ambulatory Visit: Payer: Medicare Other | Admitting: Family Medicine

## 2021-07-25 DIAGNOSIS — Z79899 Other long term (current) drug therapy: Secondary | ICD-10-CM | POA: Diagnosis not present

## 2021-07-25 DIAGNOSIS — G8929 Other chronic pain: Secondary | ICD-10-CM | POA: Diagnosis not present

## 2021-07-25 DIAGNOSIS — M25561 Pain in right knee: Secondary | ICD-10-CM | POA: Diagnosis not present

## 2021-07-25 DIAGNOSIS — M545 Low back pain, unspecified: Secondary | ICD-10-CM | POA: Diagnosis not present

## 2021-07-27 DIAGNOSIS — J449 Chronic obstructive pulmonary disease, unspecified: Secondary | ICD-10-CM | POA: Diagnosis not present

## 2021-07-27 DIAGNOSIS — Z79899 Other long term (current) drug therapy: Secondary | ICD-10-CM | POA: Diagnosis not present

## 2021-08-12 DIAGNOSIS — L2084 Intrinsic (allergic) eczema: Secondary | ICD-10-CM | POA: Diagnosis not present

## 2021-08-12 DIAGNOSIS — G309 Alzheimer's disease, unspecified: Secondary | ICD-10-CM | POA: Diagnosis not present

## 2021-08-24 DIAGNOSIS — Z79899 Other long term (current) drug therapy: Secondary | ICD-10-CM | POA: Diagnosis not present

## 2021-08-24 DIAGNOSIS — M25561 Pain in right knee: Secondary | ICD-10-CM | POA: Diagnosis not present

## 2021-08-24 DIAGNOSIS — M545 Low back pain, unspecified: Secondary | ICD-10-CM | POA: Diagnosis not present

## 2021-08-24 DIAGNOSIS — G8929 Other chronic pain: Secondary | ICD-10-CM | POA: Diagnosis not present

## 2021-08-26 DIAGNOSIS — J449 Chronic obstructive pulmonary disease, unspecified: Secondary | ICD-10-CM | POA: Diagnosis not present

## 2021-08-28 DIAGNOSIS — G309 Alzheimer's disease, unspecified: Secondary | ICD-10-CM | POA: Diagnosis not present

## 2021-08-28 DIAGNOSIS — E119 Type 2 diabetes mellitus without complications: Secondary | ICD-10-CM | POA: Diagnosis not present

## 2021-08-28 DIAGNOSIS — I482 Chronic atrial fibrillation, unspecified: Secondary | ICD-10-CM | POA: Diagnosis not present

## 2021-08-28 DIAGNOSIS — D509 Iron deficiency anemia, unspecified: Secondary | ICD-10-CM | POA: Diagnosis not present

## 2021-08-28 DIAGNOSIS — Z79899 Other long term (current) drug therapy: Secondary | ICD-10-CM | POA: Diagnosis not present

## 2021-09-26 DIAGNOSIS — J449 Chronic obstructive pulmonary disease, unspecified: Secondary | ICD-10-CM | POA: Diagnosis not present

## 2021-09-29 DIAGNOSIS — M545 Low back pain, unspecified: Secondary | ICD-10-CM | POA: Diagnosis not present

## 2021-09-29 DIAGNOSIS — M25561 Pain in right knee: Secondary | ICD-10-CM | POA: Diagnosis not present

## 2021-09-29 DIAGNOSIS — G8929 Other chronic pain: Secondary | ICD-10-CM | POA: Diagnosis not present

## 2021-09-29 DIAGNOSIS — Z79899 Other long term (current) drug therapy: Secondary | ICD-10-CM | POA: Diagnosis not present

## 2021-10-03 DIAGNOSIS — Z79899 Other long term (current) drug therapy: Secondary | ICD-10-CM | POA: Diagnosis not present

## 2021-10-26 DIAGNOSIS — J449 Chronic obstructive pulmonary disease, unspecified: Secondary | ICD-10-CM | POA: Diagnosis not present

## 2021-10-27 ENCOUNTER — Ambulatory Visit (INDEPENDENT_AMBULATORY_CARE_PROVIDER_SITE_OTHER): Payer: Medicare Other | Admitting: Family Medicine

## 2021-10-27 VITALS — BP 130/64 | HR 76 | Temp 97.3°F | Resp 18 | Ht 71.0 in | Wt 246.0 lb

## 2021-10-27 DIAGNOSIS — I10 Essential (primary) hypertension: Secondary | ICD-10-CM

## 2021-10-27 DIAGNOSIS — F01A3 Vascular dementia, mild, with mood disturbance: Secondary | ICD-10-CM | POA: Diagnosis not present

## 2021-10-27 DIAGNOSIS — G4733 Obstructive sleep apnea (adult) (pediatric): Secondary | ICD-10-CM | POA: Diagnosis not present

## 2021-10-27 DIAGNOSIS — R251 Tremor, unspecified: Secondary | ICD-10-CM

## 2021-10-27 DIAGNOSIS — J9611 Chronic respiratory failure with hypoxia: Secondary | ICD-10-CM | POA: Diagnosis not present

## 2021-10-27 DIAGNOSIS — G629 Polyneuropathy, unspecified: Secondary | ICD-10-CM

## 2021-10-27 DIAGNOSIS — R7303 Prediabetes: Secondary | ICD-10-CM | POA: Diagnosis not present

## 2021-10-27 DIAGNOSIS — E782 Mixed hyperlipidemia: Secondary | ICD-10-CM | POA: Diagnosis not present

## 2021-10-27 DIAGNOSIS — J441 Chronic obstructive pulmonary disease with (acute) exacerbation: Secondary | ICD-10-CM

## 2021-10-27 DIAGNOSIS — F01518 Vascular dementia, unspecified severity, with other behavioral disturbance: Secondary | ICD-10-CM

## 2021-10-27 DIAGNOSIS — I110 Hypertensive heart disease with heart failure: Secondary | ICD-10-CM | POA: Diagnosis not present

## 2021-10-27 DIAGNOSIS — I5042 Chronic combined systolic (congestive) and diastolic (congestive) heart failure: Secondary | ICD-10-CM

## 2021-10-27 DIAGNOSIS — Z9989 Dependence on other enabling machines and devices: Secondary | ICD-10-CM

## 2021-10-27 DIAGNOSIS — K219 Gastro-esophageal reflux disease without esophagitis: Secondary | ICD-10-CM

## 2021-10-27 MED ORDER — CEFDINIR 300 MG PO CAPS: 300.0000 mg | ORAL_CAPSULE | Freq: Two times a day (BID) | ORAL | 0 refills | Status: DC

## 2021-10-27 MED ORDER — ALBUTEROL SULFATE HFA 108 (90 BASE) MCG/ACT IN AERS: 2.0000 | INHALATION_SPRAY | Freq: Four times a day (QID) | RESPIRATORY_TRACT | 3 refills | Status: DC | PRN

## 2021-10-27 MED ORDER — ALBUTEROL SULFATE (2.5 MG/3ML) 0.083% IN NEBU: 2.5000 mg | INHALATION_SOLUTION | Freq: Four times a day (QID) | RESPIRATORY_TRACT | 12 refills | Status: DC | PRN

## 2021-10-27 MED ORDER — CEFTRIAXONE SODIUM 1 G IJ SOLR: 1.0000 g | Freq: Once | INTRAMUSCULAR | Status: DC

## 2021-10-27 MED ORDER — PREGABALIN 25 MG PO CAPS: 25.0000 mg | ORAL_CAPSULE | Freq: Two times a day (BID) | ORAL | 0 refills | Status: DC

## 2021-10-27 MED ORDER — AZITHROMYCIN 250 MG PO TABS: ORAL_TABLET | ORAL | 0 refills | Status: AC

## 2021-10-27 MED ORDER — STIOLTO RESPIMAT 2.5-2.5 MCG/ACT IN AERS: 2.0000 | INHALATION_SPRAY | Freq: Every day | RESPIRATORY_TRACT | 2 refills | Status: DC

## 2021-10-27 MED ORDER — PREDNISONE 20 MG PO TABS: ORAL_TABLET | ORAL | 0 refills | Status: AC

## 2021-10-27 MED ORDER — TRIAMCINOLONE ACETONIDE 40 MG/ML IJ SUSP
80.0000 mg | Freq: Once | INTRAMUSCULAR | Status: AC
  Administered 2021-10-27: 80 mg via INTRAMUSCULAR

## 2021-10-27 NOTE — Progress Notes (Signed)
Subjective:  Patient ID: Shawn Osborn, male    DOB: 05-02-1951  Age: 70 y.o. MRN: [id number]  Chief Complaint  Patient presents with   COPD    HPI Patient is a 70 year old white male who presents to reestablish care.  He has a past medical history of prediabetes, hypertension, congestive heart failure, COPD, depression, chronic pain syndrome (gout, osteoarthritis bilateral knees and back), OSA, CAD, Alzheimer's dementia, and hyperlipidemia.  Patient's pain is being managed by pain clinic.  Patient was previously discharged due to an abnormal drug screen showing marijuana,.  Suboxone, and oxycodone.  I have taken care of this patient for well over 15 years.  The patient's daughter called back and requested that I reconsider following his discharge 4 months ago.  I agreed to take him back home to continue to see the pain clinic.  Pain clinic is currently treating him with Suboxone.  Per the daughter and patient not they are very disappointed in the care they received at [facility].  They had a great deal of difficulty getting the prescription medications.  COPD: Currently on Stiolto 2 puffs daily, albuterol inhaler 2 puffs 4 times a day as needed as well as has nebulizer machines at home.  Patient continues to smoke but has cut down to three quarters of a pack a day.  Patient is smoked for over 40 years.    Chronic pain syndrome: Currently seen at the pain clinic.  On Suboxone.  On Lyrica 50 mg 3 times daily for neuropathy.  On allopurinol 300 mg once daily for gout.  Has diclofenac gel.  Depression: Currently on Zoloft 100 mg once daily.  Patient has been on this for several years.  Patient is on Seroquel 50 mg nightly.  Also on Rozerem 8 mg nightly for insomnia.Marland Kitchen  CAD/CHF: On Crestor 5 mg once daily, furosemide 20 mg once daily as needed swelling. LHC: 10/20/2019 Intermediate stenosis between 87 and 85% in the mid LAD.  Physiologic testing with DFR was negative for reduced flow with  values of 0.9 and 0.91 (threshold for ischemia is 0.89 and below). Widely patent left main Widely patent LAD other than as noted above Widely patent circumflex coronary artery Luminal irregularities in a dominant right coronary.  Mid PDA contains an eccentric 70% stenosis. Normal left ventricular wall motion with EF 50%.  EDP 22 mmHg.   RECOMMENDATIONS:   Aggressive preventive therapy given the patient's documented coronary artery disease. Anti-ischemic therapy as clinically indicated. If develops angina, the LAD could be treated relatively safely.  2D echo: 02/2020 1. Proximal IV septum is akinetic. Left ventricular ejection fraction, by  estimation, is 45 to 50%. The left ventricle has mildly decreased  function. The left ventricle has no regional wall motion abnormalities.  There is mild left ventricular  hypertrophy. Left ventricular diastolic parameters are consistent with  Grade I diastolic dysfunction (impaired relaxation).  Current Outpatient Medications on File Prior to Visit  Medication Sig Dispense Refill   allopurinol (ZYLOPRIM) 300 MG tablet Take 1 tablet (300 mg total) by mouth daily. 30 tablet 0   buprenorphine-naloxone (SUBOXONE) 8-2 mg SUBL SL tablet Place 1 tablet under the tongue daily.     donepezil (ARICEPT) 10 MG tablet Take 1 tablet (10 mg total) by mouth at bedtime. 30 tablet 0   ferrous sulfate 324 MG TBEC Take 324 mg by mouth daily with breakfast.     QUEtiapine (SEROQUEL) 50 MG tablet Take 1 tablet (50 mg total) by mouth at  bedtime. 90 tablet 0   diclofenac Sodium (VOLTAREN) 1 % GEL Apply 2 g topically 4 (four) times daily as needed. 50 g 0   Potassium 99 MG TABS Take 297 mg by mouth daily as needed.      triamcinolone (KENALOG) 0.1 % Apply 1 application topically 2 (two) times daily as needed (to ears for itching). 30 g 2   No current facility-administered medications on file prior to visit.   Past Medical History:  Diagnosis Date   Acute combined  systolic and diastolic CHF, NYHA class 3 (HCC) 10/08/2019   Acute delirium 10/08/2019   Acute exacerbation of CHF (congestive heart failure) ([place]) 10/08/2019   Acute gout of multiple sites 07/01/2016   Acute respiratory failure with hypoxia and hypercapnia (HCC) 10/08/2019   Allergy    Anxiety    Arthritis    Gout, osteoarthritis in Back and Knees   Cataract    CHF (congestive heart failure) (HCC)    Chronic bronchitis    Community acquired pneumonia 10/08/2019   COPD (chronic obstructive pulmonary disease) ([place]) 10/08/2019   Dementia ([place]) 10/08/2019   Elevated troponin 10/08/2019   Emphysema of lung ([place])    Essential hypertension 07/03/2016   GERD (gastroesophageal reflux disease)    History of kidney stones    HLD (hyperlipidemia) 10/08/2019   Hypercapnic respiratory failure ([place]) 10/08/2019   Hyperlipidemia    Hypertension    Hypertensive heart disease 10/08/2019   Morbid obesity due to excess calories ([place]) 10/08/2019   Narcotic overdose ([place]) 10/08/2019   Neuropathy    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   NSTEMI (non-ST elevated myocardial infarction) ([place]) 10/08/2019   Obstructive sleep apnea    Occlusion and stenosis of carotid artery without mention of cerebral infarction 12/13/2011   OSA on CPAP 10/08/2019   Pain in joint of left shoulder 01/12/2019   Paranoia (psychosis) ([place]) 07/01/2016   Poorly-controlled hypertension 10/08/2019   Prediabetes    Psychotic disorder with delusions ([place]) 07/01/2016   Respiratory failure with hypoxia and hypercapnia ([place]) 10/08/2019   Restless leg syndrome 07/03/2016   Rheumatoid arthritis(714.0)    RLS (restless legs syndrome)    Sepsis ([place]) 10/08/2019   Sleep apnea    Spinal stenosis    Syncope 04/03/2019   Tear of left rotator cuff 3/84/5364   Toxic metabolic encephalopathy 68/01/2121   UTI (urinary tract infection) 10/08/2019   Vascular dementia with behavioral disturbance 07/06/2016   Past Surgical History:   Procedure Laterality Date   APPENDECTOMY     CARPAL TUNNEL RELEASE     EYE SURGERY     INTRAVASCULAR PRESSURE WIRE/FFR STUDY N/A 10/20/2019   Procedure: INTRAVASCULAR PRESSURE WIRE/FFR STUDY;  Surgeon: Belva Crome, MD;  Location: [facility];  Service: Cardiovascular;  Laterality: N/A;   LEFT HEART CATH AND CORONARY ANGIOGRAPHY N/A 10/20/2019   Procedure: LEFT HEART CATH AND CORONARY ANGIOGRAPHY;  Surgeon: Belva Crome, MD;  Location: [facility];  Service: Cardiovascular;  Laterality: N/A;   [place], 2003, 2012   Laminectomy X 3    Family History  Problem Relation Age of Onset   Heart disease Mother    Hypertension Mother    Social History   Socioeconomic History   Marital status: Widowed    Spouse name: Not on file   Number of children: 4   Years of education: Not on file   Highest education level: Not on file  Occupational History  Not on file  Tobacco Use   Smoking status: Every Day    Packs/day: 0.75    Years: 53.00    Pack years: 39.75    Types: Cigarettes   Smokeless tobacco: Current    Types: Chew  Vaping Use   Vaping Use: Never used  Substance and Sexual Activity   Alcohol use: No   Drug use: No   Sexual activity: Not Currently  Other Topics Concern   Not on file  Social History Narrative   Not on file   Social Determinants of Health   Financial Resource Strain: Not on file  Food Insecurity: Not on file  Transportation Needs: Unmet Transportation Needs   Lack of Transportation (Medical): Yes   Lack of Transportation (Non-Medical): Yes  Physical Activity: Not on file  Stress: Not on file  Social Connections: Not on file    Review of Systems  Constitutional:  Positive for fatigue. Negative for chills and fever.  HENT:  Positive for congestion. Negative for rhinorrhea and sore throat.   Respiratory:  Positive for cough and shortness of breath.   Cardiovascular:  Positive for leg swelling (x 1 month. Had been eating a  lot of salt, but stopped. Now improving.). Negative for chest pain and palpitations.  Gastrointestinal:  Positive for constipation (Has to take MOM every 3 days to have a bm. Has been like this for a while. Miralax did not help much.). Negative for abdominal pain, diarrhea, nausea and vomiting.  Endocrine: Negative for polydipsia and polyphagia.  Genitourinary:  Positive for frequency. Negative for dysuria and urgency.  Musculoskeletal:  Negative for arthralgias, back pain and myalgias.  Neurological:  Positive for headaches (sinus headache for all his life.). Negative for dizziness.  Psychiatric/Behavioral:  Negative for dysphoric mood. The patient is not nervous/anxious.     Objective:  BP 130/64   Pulse 76   Temp (!) 97.3 F (36.3 C)   Resp 18   Ht 5\' 11"  (1.803 m)   Wt 246 lb (111.6 kg)   SpO2 (!) 89%   BMI 34.31 kg/m   BP/Weight 11/16/2021 78/12/9560 11/22/863  Systolic BP 784 696 295  Diastolic BP 72 64 64  Wt. (Lbs) 249 246 282  BMI 34.73 34.31 40.46    Physical Exam Constitutional:      Appearance: Normal appearance. He is obese.  HENT:     Right Ear: Tympanic membrane, ear canal and external ear normal.     Left Ear: Tympanic membrane, ear canal and external ear normal.     Nose: Nose normal. No congestion or rhinorrhea.     Mouth/Throat:     Mouth: Mucous membranes are moist.     Pharynx: No oropharyngeal exudate or posterior oropharyngeal erythema.  Neck:     Vascular: No carotid bruit.  Cardiovascular:     Rate and Rhythm: Normal rate and regular rhythm.     Heart sounds: Normal heart sounds.  Pulmonary:     Effort: Pulmonary effort is normal. No respiratory distress.     Breath sounds: Wheezing and rhonchi present. No rales.  Abdominal:     General: Bowel sounds are normal.     Palpations: Abdomen is soft.     Tenderness: There is no abdominal tenderness.  Musculoskeletal:        General: Tenderness (lumbar. Using walker. Leans forward to decrease pain in  back.) present.  Lymphadenopathy:     Cervical: No cervical adenopathy.  Neurological:     Mental Status: He  is alert.  Psychiatric:        Mood and Affect: Mood normal.        Behavior: Behavior normal.    Diabetic Foot Exam - Simple   No data filed      Lab Results  Component Value Date   WBC 12.7 (H) 10/27/2021   HGB 11.8 (L) 10/27/2021   HCT 39.6 10/27/2021   PLT 437 10/27/2021   GLUCOSE 83 10/27/2021   CHOL 125 02/06/2021   TRIG 109 02/06/2021   HDL 47 02/06/2021   LDLCALC 58 02/06/2021   ALT 11 10/27/2021   AST 19 10/27/2021   NA 141 10/27/2021   K 5.3 (H) 10/27/2021   CL 98 10/27/2021   CREATININE 0.55 (L) 10/27/2021   BUN 11 10/27/2021   CO2 28 10/27/2021   TSH 5.120 (H) 10/27/2021   INR 0.9 08/30/2020   HGBA1C 5.6 10/27/2021      Assessment & Plan:   Problem List Items Addressed This Visit       Cardiovascular and Mediastinum   Hypertensive heart disease    Management per specialist. Dr. Harriet Masson Continue lasix 20 mg prn.        RESOLVED: Essential hypertension   Relevant Orders   Hemoglobin A1c (Completed)   CHF (congestive heart failure) (HCC)     Respiratory   OSA on CPAP    Continue to wear CPAP.      COPD with acute exacerbation (HCC) - Primary    COPD/ Pneumonia: Given kenalog 80 mg and Rocephin 1 gm shots.  2. Antibiotic Rxs for cefdinir and zithromax. 3. Start stiolto 2 inhalations/puffs daily  4. Use albuterol inhaler or nebulizer up to 4 times a day as needed for shortness of breath and wheezing.  5. Rx: Prednisone taper        Relevant Medications   Tiotropium Bromide-Olodaterol (STIOLTO RESPIMAT) 2.5-2.5 MCG/ACT AERS   albuterol (PROVENTIL) (2.5 MG/3ML) 0.083% nebulizer solution   albuterol (VENTOLIN HFA) 108 (90 Base) MCG/ACT inhaler   Other Relevant Orders   CBC with Differential/Platelet (Completed)   Comprehensive metabolic panel (Completed)   TSH (Completed)     Digestive   GERD (gastroesophageal reflux disease)      Nervous and Auditory   Vascular dementia with behavioral disturbance    Continue Aricept.  Continue Namenda. Continue Crestor.      Neuropathy    For neuropathy: Sent Lyrica 25 mg one twice a day to add to 50 mg twice a day he has in his pill pack.        RESOLVED: Dementia ([place])     Other   Tremor of both hands    Continue propranolol extended release 60 mg once daily.      Prediabetes    A1c has been stable at 5.5-5.6 for the last few years      Hyperlipidemia  .  Meds ordered this encounter  Medications   predniSONE (DELTASONE) 20 MG tablet    Sig: Take 3 tablets (60 mg total) by mouth daily with breakfast for 3 days, THEN 2 tablets (40 mg total) daily with breakfast for 3 days, THEN 1 tablet (20 mg total) daily with breakfast for 3 days.    Dispense:  18 tablet    Refill:  0   triamcinolone acetonide (KENALOG-40) injection 80 mg   DISCONTD: cefTRIAXone (ROCEPHIN) injection 1 g   DISCONTD: cefdinir (OMNICEF) 300 MG capsule    Sig: Take 1 capsule (300 mg total) by mouth 2 (  two) times daily.    Dispense:  20 capsule    Refill:  0   azithromycin (ZITHROMAX) 250 MG tablet    Sig: Take 2 tablets on day 1, then 1 tablet daily on days 2 through 5    Dispense:  6 tablet    Refill:  0   DISCONTD: pregabalin (LYRICA) 25 MG capsule    Sig: Take 1 capsule (25 mg total) by mouth 2 (two) times daily.    Dispense:  60 capsule    Refill:  0    Pt to take in addition to lyrica in his pill pack.   Tiotropium Bromide-Olodaterol (STIOLTO RESPIMAT) 2.5-2.5 MCG/ACT AERS    Sig: Inhale 2 puffs into the lungs daily.    Dispense:  4 g    Refill:  2   albuterol (PROVENTIL) (2.5 MG/3ML) 0.083% nebulizer solution    Sig: Take 3 mLs (2.5 mg total) by nebulization every 6 (six) hours as needed for wheezing or shortness of breath.    Dispense:  120 mL    Refill:  12    40 vials.   albuterol (VENTOLIN HFA) 108 (90 Base) MCG/ACT inhaler    Sig: Inhale 2 puffs into the lungs every 6  (six) hours as needed for wheezing or shortness of breath.    Dispense:  8 g    Refill:  3    Orders Placed This Encounter  Procedures   CBC with Differential/Platelet   Comprehensive metabolic panel   TSH   Hemoglobin A1c   T4, free     Follow-up: Return in about 3 weeks (around 11/17/2021).  An After Visit Summary was printed and given to the patient.  Rochel Brome, MD [facility] [phone]

## 2021-10-27 NOTE — Patient Instructions (Signed)
COPD/ Pneumonia: Given kenalog 80 mg and Rocephin 1 gm shots.  2. Antibiotic Rxs for cefdinir and zithromax. 3. Start stiolto 2 inhalations/puffs daily  4. Use albuterol inhaler or nebulizer up to 4 times a day as needed for shortness of breath and wheezing.  5. Rx: Prednisone taper   6. For neuropathy: Sent Lyrica 25 mg one twice a day to add to 50 mg twice a day he has in his pill pack.

## 2021-10-28 LAB — CBC WITH DIFFERENTIAL/PLATELET
Basophils Absolute: 0.1 10*3/uL (ref 0.0–0.2)
Basos: 1 %
EOS (ABSOLUTE): 0.3 10*3/uL (ref 0.0–0.4)
Eos: 2 %
Hematocrit: 39.6 % (ref 37.5–51.0)
Hemoglobin: 11.8 g/dL — ABNORMAL LOW (ref 13.0–17.7)
Immature Grans (Abs): 0.1 10*3/uL (ref 0.0–0.1)
Immature Granulocytes: 1 %
Lymphocytes Absolute: 3 10*3/uL (ref 0.7–3.1)
Lymphs: 24 %
MCH: 25.1 pg — ABNORMAL LOW (ref 26.6–33.0)
MCHC: 29.8 g/dL — ABNORMAL LOW (ref 31.5–35.7)
MCV: 84 fL (ref 79–97)
Monocytes Absolute: 1.2 10*3/uL — ABNORMAL HIGH (ref 0.1–0.9)
Monocytes: 9 %
Neutrophils Absolute: 8.1 10*3/uL — ABNORMAL HIGH (ref 1.4–7.0)
Neutrophils: 63 %
Platelets: 437 10*3/uL (ref 150–450)
RBC: 4.7 x10E6/uL (ref 4.14–5.80)
RDW: 16.3 % — ABNORMAL HIGH (ref 11.6–15.4)
WBC: 12.7 10*3/uL — ABNORMAL HIGH (ref 3.4–10.8)

## 2021-10-28 LAB — TSH: TSH: 5.12 u[IU]/mL — ABNORMAL HIGH (ref 0.450–4.500)

## 2021-10-28 LAB — COMPREHENSIVE METABOLIC PANEL
ALT: 11 IU/L (ref 0–44)
AST: 19 IU/L (ref 0–40)
Albumin/Globulin Ratio: 1.1 — ABNORMAL LOW (ref 1.2–2.2)
Albumin: 3.5 g/dL — ABNORMAL LOW (ref 3.7–4.7)
Alkaline Phosphatase: 86 IU/L (ref 44–121)
BUN/Creatinine Ratio: 20 (ref 10–24)
BUN: 11 mg/dL (ref 8–27)
Bilirubin Total: 0.2 mg/dL (ref 0.0–1.2)
CO2: 28 mmol/L (ref 20–29)
Calcium: 9.2 mg/dL (ref 8.6–10.2)
Chloride: 98 mmol/L (ref 96–106)
Creatinine, Ser: 0.55 mg/dL — ABNORMAL LOW (ref 0.76–1.27)
Globulin, Total: 3.2 g/dL (ref 1.5–4.5)
Glucose: 83 mg/dL (ref 70–99)
Potassium: 5.3 mmol/L — ABNORMAL HIGH (ref 3.5–5.2)
Sodium: 141 mmol/L (ref 134–144)
Total Protein: 6.7 g/dL (ref 6.0–8.5)
eGFR: 106 mL/min/{1.73_m2} (ref >59)

## 2021-10-28 LAB — HEMOGLOBIN A1C
Est. average glucose Bld gHb Est-mCnc: 114 mg/dL
Hgb A1c MFr Bld: 5.6 % (ref 4.8–5.6)

## 2021-10-30 NOTE — Progress Notes (Signed)
Blood count abnormal, but stable. Wbc elevated. Anemia stable.  Liver function normal.  Kidney function normal.  Thyroid function abnormal. Add on free T4. HBA1C: good

## 2021-11-01 LAB — T4, FREE: Free T4: 1.07 ng/dL (ref 0.82–1.77)

## 2021-11-16 ENCOUNTER — Encounter: Payer: Self-pay | Admitting: Family Medicine

## 2021-11-16 ENCOUNTER — Ambulatory Visit (INDEPENDENT_AMBULATORY_CARE_PROVIDER_SITE_OTHER): Payer: Medicare Other | Admitting: Family Medicine

## 2021-11-16 ENCOUNTER — Other Ambulatory Visit: Payer: Self-pay | Admitting: Family Medicine

## 2021-11-16 ENCOUNTER — Other Ambulatory Visit: Payer: Self-pay

## 2021-11-16 VITALS — BP 118/72 | HR 72 | Temp 98.3°F | Ht 71.0 in | Wt 249.0 lb

## 2021-11-16 DIAGNOSIS — F1190 Opioid use, unspecified, uncomplicated: Secondary | ICD-10-CM

## 2021-11-16 DIAGNOSIS — R7303 Prediabetes: Secondary | ICD-10-CM

## 2021-11-16 DIAGNOSIS — G629 Polyneuropathy, unspecified: Secondary | ICD-10-CM | POA: Diagnosis not present

## 2021-11-16 DIAGNOSIS — J441 Chronic obstructive pulmonary disease with (acute) exacerbation: Secondary | ICD-10-CM

## 2021-11-16 DIAGNOSIS — R351 Nocturia: Secondary | ICD-10-CM

## 2021-11-16 DIAGNOSIS — K5904 Chronic idiopathic constipation: Secondary | ICD-10-CM | POA: Diagnosis not present

## 2021-11-16 MED ORDER — PREGABALIN 50 MG PO CAPS: 50.0000 mg | ORAL_CAPSULE | Freq: Three times a day (TID) | ORAL | 3 refills | Status: DC

## 2021-11-16 MED ORDER — TRIAMCINOLONE 0.1 % CREAM:EUCERIN CREAM 1:1: 1.0000 "application " | TOPICAL_CREAM | Freq: Two times a day (BID) | CUTANEOUS | 3 refills | Status: DC

## 2021-11-16 MED ORDER — RAMELTEON 8 MG PO TABS: 8.0000 mg | ORAL_TABLET | Freq: Every day | ORAL | 11 refills | Status: DC

## 2021-11-16 NOTE — Patient Instructions (Addendum)
STOP METFORMIN.  FOR CONSTIPATION: RECOMMEND MIRALAX 1 TSP MIXED WITH METAMUCIL 1 TSP DAILY. CHANGE LYRICA TO 50 MG ONE THREE TIMES A DAY.  CONTINUE STIOLTO FOR COPD.

## 2021-11-16 NOTE — Progress Notes (Signed)
Subjective:  Patient ID: Shawn Osborn, male    DOB: 24-Sep-1951  Age: 70 y.o. MRN: [id number]  Chief Complaint  Patient presents with   COPD    3 week follow up    COPD There is no cough or shortness of breath. Pertinent negatives include no chest pain, ear pain, fever, headaches, myalgias or sore throat. His past medical history is significant for COPD.   Pr treated for copd exacerbation and his last appointment with kenalog 80 mg and Rocephin 1 gm shots, cefdinir and zithromax. I started him on stiolto 2 inhalations/puffs daily. He has albuterol inhaler and nebulizer for rescue use. He completed the prednisone I gave him. His breathing is better.     neuropathy: neuropathy improved with increase to lyrica to 75 mg bid, but makes him sleepy. Sent Lyrica 25 mg one twice a day to add to 50 mg twice a day he has in his pill pack.   Rt > lt knee pain and back pain. Being seen at Suboxone clinic in [place], [place]. See him once a month.   Pt has issue with legs. Swells at times and gets red and scales up. It will weep some. NO blisters. Uses lotion. It is looking better today.   Sugars: 100-122. Does not have hypoglycemia very much.   HX OF Carpal tunnel syndrome. HAD SURGERY ON RT HAND.  Both hands numb and burn. Dropping items.   Current Outpatient Medications on File Prior to Visit  Medication Sig Dispense Refill   albuterol (PROVENTIL) (2.5 MG/3ML) 0.083% nebulizer solution Take 3 mLs (2.5 mg total) by nebulization every 6 (six) hours as needed for wheezing or shortness of breath. 120 mL 12   albuterol (VENTOLIN HFA) 108 (90 Base) MCG/ACT inhaler Inhale 2 puffs into the lungs every 6 (six) hours as needed for wheezing or shortness of breath. 8 g 3   allopurinol (ZYLOPRIM) 300 MG tablet Take 1 tablet (300 mg total) by mouth daily. 30 tablet 0   buprenorphine-naloxone (SUBOXONE) 8-2 mg SUBL SL tablet Place 1 tablet under the tongue daily.     diclofenac Sodium (VOLTAREN) 1 % GEL Apply  2 g topically 4 (four) times daily as needed. 50 g 0   donepezil (ARICEPT) 10 MG tablet Take 1 tablet (10 mg total) by mouth at bedtime. 30 tablet 0   ferrous sulfate 324 MG TBEC Take 324 mg by mouth daily with breakfast.     memantine (NAMENDA XR) 28 MG CP24 24 hr capsule Take 28 mg by mouth at bedtime.     Potassium 99 MG TABS Take 297 mg by mouth daily as needed.      QUEtiapine (SEROQUEL) 50 MG tablet Take 1 tablet (50 mg total) by mouth at bedtime. 90 tablet 0   Tiotropium Bromide-Olodaterol (STIOLTO RESPIMAT) 2.5-2.5 MCG/ACT AERS Inhale 2 puffs into the lungs daily. 4 g 2   triamcinolone (KENALOG) 0.1 % Apply 1 application topically 2 (two) times daily as needed (to ears for itching). 30 g 2   No current facility-administered medications on file prior to visit.   Past Medical History:  Diagnosis Date   Acute combined systolic and diastolic CHF, NYHA class 3 ([place]) 10/08/2019   Acute delirium 10/08/2019   Acute exacerbation of CHF (congestive heart failure) ([place]) 10/08/2019   Acute gout of multiple sites 07/01/2016   Acute respiratory failure with hypoxia and hypercapnia ([facility]) 10/08/2019   Allergy    Anxiety    Arthritis    Gout,  osteoarthritis in Back and Knees   Cataract    CHF (congestive heart failure) ([facility])    Chronic bronchitis    Community acquired pneumonia 10/08/2019   COPD (chronic obstructive pulmonary disease) ([place]) 10/08/2019   Dementia ([place]) 10/08/2019   Elevated troponin 10/08/2019   Emphysema of lung ([place])    Essential hypertension 07/03/2016   GERD (gastroesophageal reflux disease)    History of kidney stones    HLD (hyperlipidemia) 10/08/2019   Hypercapnic respiratory failure ([place]) 10/08/2019   Hyperlipidemia    Hypertension    Hypertensive heart disease 10/08/2019   Morbid obesity due to excess calories ([place]) 10/08/2019   Narcotic overdose ([place]) 10/08/2019   Neuropathy    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   NSTEMI (non-ST elevated myocardial  infarction) ([place]) 10/08/2019   Obstructive sleep apnea    Occlusion and stenosis of carotid artery without mention of cerebral infarction 12/13/2011   OSA on CPAP 10/08/2019   Pain in joint of left shoulder 01/12/2019   Paranoia (psychosis) ([place]) 07/01/2016   Poorly-controlled hypertension 10/08/2019   Prediabetes    Psychotic disorder with delusions ([place]) 07/01/2016   Respiratory failure with hypoxia and hypercapnia ([place]) 10/08/2019   Restless leg syndrome 07/03/2016   Rheumatoid arthritis(714.0)    RLS (restless legs syndrome)    Sepsis ([place]) 10/08/2019   Sleep apnea    Spinal stenosis    Syncope 04/03/2019   Tear of left rotator cuff 3/87/5643   Toxic metabolic encephalopathy 32/95/1884   UTI (urinary tract infection) 10/08/2019   Vascular dementia with behavioral disturbance 07/06/2016   Past Surgical History:  Procedure Laterality Date   APPENDECTOMY     CARPAL TUNNEL RELEASE     EYE SURGERY     INTRAVASCULAR PRESSURE WIRE/FFR STUDY N/A 10/20/2019   Procedure: INTRAVASCULAR PRESSURE WIRE/FFR STUDY;  Surgeon: Belva Crome, MD;  Location: [facility];  Service: Cardiovascular;  Laterality: N/A;   LEFT HEART CATH AND CORONARY ANGIOGRAPHY N/A 10/20/2019   Procedure: LEFT HEART CATH AND CORONARY ANGIOGRAPHY;  Surgeon: Belva Crome, MD;  Location: [facility];  Service: Cardiovascular;  Laterality: N/A;   [place], 2003, 2012   Laminectomy X 3    Family History  Problem Relation Age of Onset   Heart disease Mother    Hypertension Mother    Social History   Socioeconomic History   Marital status: Widowed    Spouse name: Not on file   Number of children: 4   Years of education: Not on file   Highest education level: Not on file  Occupational History   Not on file  Tobacco Use   Smoking status: Every Day    Packs/day: 0.75    Years: 53.00    Pack years: 39.75    Types: Cigarettes   Smokeless tobacco: Current    Types: Chew  Vaping Use   Vaping  Use: Never used  Substance and Sexual Activity   Alcohol use: No   Drug use: No   Sexual activity: Not Currently  Other Topics Concern   Not on file  Social History Narrative   Not on file   Social Determinants of Health   Financial Resource Strain: Not on file  Food Insecurity: Not on file  Transportation Needs: Unmet Transportation Needs   Lack of Transportation (Medical): Yes   Lack of Transportation (Non-Medical): Yes  Physical Activity: Not on file  Stress: Not on file  Social Connections: Not on  file    Review of Systems  Constitutional:  Negative for chills, fatigue and fever.  HENT:  Negative for congestion, ear pain and sore throat.   Respiratory:  Negative for cough and shortness of breath.   Cardiovascular:  Negative for chest pain.  Gastrointestinal:  Positive for constipation (Has to take MOM to move bowels. Takes about twice a week.). Negative for abdominal pain, diarrhea, nausea and vomiting.  Endocrine: Negative for polydipsia, polyphagia and polyuria.  Genitourinary:  Positive for dysuria. Negative for frequency.       Has to strain urine.   Musculoskeletal:  Positive for arthralgias and back pain. Negative for myalgias.  Neurological:  Negative for dizziness and headaches.  Psychiatric/Behavioral:  Negative for dysphoric mood.        No dysphoria    Objective:  BP 118/72 (BP Location: Right Arm, Patient Position: Sitting)    Pulse 72    Temp 98.3 F (36.8 C) (Temporal)    Ht 5\' 11"  (1.803 m)    Wt 249 lb (112.9 kg)    SpO2 94%    BMI 34.73 kg/m   BP/Weight 11/16/2021 12/24/971 03/22/2991  Systolic BP 426 834 196  Diastolic BP 72 64 64  Wt. (Lbs) 249 246 282  BMI 34.73 34.31 40.46    Physical Exam Vitals reviewed.  Constitutional:      Appearance: Normal appearance.     Comments: Using walker. Moving better.   Neck:     Vascular: No carotid bruit.  Cardiovascular:     Rate and Rhythm: Normal rate and regular rhythm.     Pulses: Normal pulses.      Heart sounds: Normal heart sounds.  Pulmonary:     Effort: Pulmonary effort is normal.     Breath sounds: Normal breath sounds. No wheezing, rhonchi or rales.  Abdominal:     General: Bowel sounds are normal.     Palpations: Abdomen is soft.     Tenderness: There is no abdominal tenderness.  Skin:    Comments: Legs scaling distally. Stasis dermatitis. No weeping currently.  Neurological:     Mental Status: He is alert and oriented to person, place, and time.  Psychiatric:        Mood and Affect: Mood normal.        Behavior: Behavior normal.    Diabetic Foot Exam - Simple   No data filed      Lab Results  Component Value Date   WBC 12.7 (H) 10/27/2021   HGB 11.8 (L) 10/27/2021   HCT 39.6 10/27/2021   PLT 437 10/27/2021   GLUCOSE 83 10/27/2021   CHOL 125 02/06/2021   TRIG 109 02/06/2021   HDL 47 02/06/2021   LDLCALC 58 02/06/2021   ALT 11 10/27/2021   AST 19 10/27/2021   NA 141 10/27/2021   K 5.3 (H) 10/27/2021   CL 98 10/27/2021   CREATININE 0.55 (L) 10/27/2021   BUN 11 10/27/2021   CO2 28 10/27/2021   TSH 5.120 (H) 10/27/2021   INR 0.9 08/30/2020   HGBA1C 5.6 10/27/2021      Assessment & Plan:   Problem List Items Addressed This Visit       Respiratory   COPD with acute exacerbation ([place])    Resolved.  CONTINUE STIOLTO FOR COPD.        Digestive   Chronic idiopathic constipation - Primary    RECOMMEND MIRALAX 1 TSP MIXED WITH METAMUCIL 1 TSP DAILY.  Nervous and Auditory   Neuropathy    CHANGE LYRICA TO 50 MG ONE THREE TIMES A DAY.          Other   Prediabetes    STOP METFORMIN.       .  Meds ordered this encounter  Medications   Triamcinolone Acetonide (TRIAMCINOLONE 0.1 % CREAM : EUCERIN) CREA    Sig: Apply 1 application topically 2 (two) times daily. FOR LEGS.    Dispense:  1 each    Refill:  3   pregabalin (LYRICA) 50 MG capsule    Sig: Take 1 capsule (50 mg total) by mouth 3 (three) times daily.    Dispense:   90 capsule    Refill:  3   ramelteon (ROZEREM) 8 MG tablet    Sig: Take 1 tablet (8 mg total) by mouth at bedtime.    Dispense:  30 tablet    Refill:  11     Follow-up: Return in about 2 months (around 01/29/2022) for chronic fasting.  An After Visit Summary was printed and given to the patient.  Rochel Brome, MD [facility] [phone]

## 2021-12-01 ENCOUNTER — Other Ambulatory Visit: Payer: Self-pay | Admitting: Family Medicine

## 2021-12-03 ENCOUNTER — Encounter: Payer: Self-pay | Admitting: Family Medicine

## 2021-12-03 DIAGNOSIS — K5904 Chronic idiopathic constipation: Secondary | ICD-10-CM | POA: Insufficient documentation

## 2021-12-03 DIAGNOSIS — J9611 Chronic respiratory failure with hypoxia: Secondary | ICD-10-CM | POA: Insufficient documentation

## 2021-12-03 DIAGNOSIS — J441 Chronic obstructive pulmonary disease with (acute) exacerbation: Secondary | ICD-10-CM | POA: Insufficient documentation

## 2021-12-03 NOTE — Assessment & Plan Note (Signed)
COPD/ Pneumonia: 1. Given kenalog 80 mg and Rocephin 1 gm shots.  2. Antibiotic Rxs for cefdinir and zithromax. 3. Start stiolto 2 inhalations/puffs daily  4. Use albuterol inhaler or nebulizer up to 4 times a day as needed for shortness of breath and wheezing.  5. Rx: Prednisone taper

## 2021-12-03 NOTE — Assessment & Plan Note (Signed)
A1c has been stable at 5.5-5.6 for the last few years

## 2021-12-03 NOTE — Assessment & Plan Note (Signed)
Continue propranolol extended release 60 mg once daily.

## 2021-12-03 NOTE — Assessment & Plan Note (Signed)
Continue home O2 at 2 L

## 2021-12-03 NOTE — Assessment & Plan Note (Signed)
Continue Aricept.  Continue Namenda. Continue Crestor.

## 2021-12-03 NOTE — Assessment & Plan Note (Signed)
>>  ASSESSMENT AND PLAN FOR HYPERTENSIVE HEART DISEASE WRITTEN ON 12/03/2021  2:44 PM BY COX, KIRSTEN, MD  Management per specialist. Dr. Harriet Masson Continue lasix 20 mg prn.

## 2021-12-03 NOTE — Assessment & Plan Note (Signed)
Resolved.  CONTINUE STIOLTO FOR COPD.

## 2021-12-03 NOTE — Assessment & Plan Note (Signed)
CHANGE LYRICA TO 50 MG ONE THREE TIMES A DAY.

## 2021-12-03 NOTE — Assessment & Plan Note (Signed)
Continue to wear CPAP.  

## 2021-12-03 NOTE — Assessment & Plan Note (Signed)
Continue omeprazole 40 mg daily

## 2021-12-03 NOTE — Assessment & Plan Note (Signed)
Management per specialist. Dr. Harriet Masson Continue lasix 20 mg prn.

## 2021-12-03 NOTE — Assessment & Plan Note (Addendum)
STOP METFORMIN.  Unnecessary.

## 2021-12-03 NOTE — Assessment & Plan Note (Signed)
For neuropathy: Sent Lyrica 25 mg one twice a day to add to 50 mg twice a day he has in his pill pack.

## 2021-12-03 NOTE — Assessment & Plan Note (Signed)
RECOMMEND MIRALAX 1 TSP MIXED WITH METAMUCIL 1 TSP DAILY.

## 2021-12-13 ENCOUNTER — Other Ambulatory Visit: Payer: Self-pay | Admitting: Family Medicine

## 2021-12-13 DIAGNOSIS — M1A09X0 Idiopathic chronic gout, multiple sites, without tophus (tophi): Secondary | ICD-10-CM

## 2021-12-29 ENCOUNTER — Other Ambulatory Visit: Payer: Self-pay | Admitting: Family Medicine

## 2021-12-29 DIAGNOSIS — R351 Nocturia: Secondary | ICD-10-CM
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
## 2022-01-02 ENCOUNTER — Other Ambulatory Visit: Payer: Self-pay | Admitting: Family Medicine

## 2022-01-02 NOTE — Telephone Encounter (Signed)
Refill sent to pharmacy.   

## 2022-01-15 ENCOUNTER — Other Ambulatory Visit: Payer: Self-pay | Admitting: Family Medicine

## 2022-01-15 DIAGNOSIS — M1A09X0 Idiopathic chronic gout, multiple sites, without tophus (tophi): Secondary | ICD-10-CM

## 2022-01-23 DIAGNOSIS — M25561 Pain in right knee: Secondary | ICD-10-CM | POA: Diagnosis not present

## 2022-01-23 DIAGNOSIS — G8929 Other chronic pain: Secondary | ICD-10-CM | POA: Diagnosis not present

## 2022-01-23 DIAGNOSIS — F1190 Opioid use, unspecified, uncomplicated: Secondary | ICD-10-CM | POA: Diagnosis not present

## 2022-01-23 DIAGNOSIS — Z79899 Other long term (current) drug therapy: Secondary | ICD-10-CM | POA: Diagnosis not present

## 2022-01-23 DIAGNOSIS — M545 Low back pain, unspecified: Secondary | ICD-10-CM | POA: Diagnosis not present

## 2022-02-08 ENCOUNTER — Other Ambulatory Visit: Payer: Self-pay

## 2022-02-08 MED ORDER — ROSUVASTATIN CALCIUM 5 MG PO TABS
5.0000 mg | ORAL_TABLET | Freq: Every day | ORAL | 0 refills | Status: DC
Start: 2022-02-08 — End: 2022-06-26

## 2022-02-15 ENCOUNTER — Ambulatory Visit: Payer: Medicare Other | Admitting: Family Medicine

## 2022-02-16 ENCOUNTER — Ambulatory Visit (INDEPENDENT_AMBULATORY_CARE_PROVIDER_SITE_OTHER): Payer: Medicare Other | Admitting: Family Medicine

## 2022-02-16 VITALS — BP 130/64 | HR 84 | Temp 96.7°F | Resp 18 | Ht 70.0 in | Wt 262.0 lb

## 2022-02-16 DIAGNOSIS — G4733 Obstructive sleep apnea (adult) (pediatric): Secondary | ICD-10-CM

## 2022-02-16 DIAGNOSIS — I110 Hypertensive heart disease with heart failure: Secondary | ICD-10-CM | POA: Diagnosis not present

## 2022-02-16 DIAGNOSIS — J9611 Chronic respiratory failure with hypoxia: Secondary | ICD-10-CM

## 2022-02-16 DIAGNOSIS — I5042 Chronic combined systolic (congestive) and diastolic (congestive) heart failure: Secondary | ICD-10-CM

## 2022-02-16 DIAGNOSIS — I5030 Unspecified diastolic (congestive) heart failure: Secondary | ICD-10-CM

## 2022-02-16 DIAGNOSIS — R7303 Prediabetes: Secondary | ICD-10-CM | POA: Diagnosis not present

## 2022-02-16 DIAGNOSIS — Z6837 Body mass index (BMI) 37.0-37.9, adult: Secondary | ICD-10-CM

## 2022-02-16 DIAGNOSIS — J418 Mixed simple and mucopurulent chronic bronchitis: Secondary | ICD-10-CM

## 2022-02-16 DIAGNOSIS — F1120 Opioid dependence, uncomplicated: Secondary | ICD-10-CM

## 2022-02-16 DIAGNOSIS — K5904 Chronic idiopathic constipation: Secondary | ICD-10-CM

## 2022-02-16 DIAGNOSIS — K219 Gastro-esophageal reflux disease without esophagitis: Secondary | ICD-10-CM

## 2022-02-16 DIAGNOSIS — I25118 Atherosclerotic heart disease of native coronary artery with other forms of angina pectoris: Secondary | ICD-10-CM

## 2022-02-16 DIAGNOSIS — Z9989 Dependence on other enabling machines and devices: Secondary | ICD-10-CM

## 2022-02-16 DIAGNOSIS — G894 Chronic pain syndrome: Secondary | ICD-10-CM

## 2022-02-16 DIAGNOSIS — E782 Mixed hyperlipidemia: Secondary | ICD-10-CM

## 2022-02-16 MED ORDER — SERTRALINE HCL 100 MG PO TABS: 200.0000 mg | ORAL_TABLET | Freq: Every day | ORAL | 5 refills | Status: DC

## 2022-02-16 MED ORDER — STIOLTO RESPIMAT 2.5-2.5 MCG/ACT IN AERS: 2.0000 | INHALATION_SPRAY | Freq: Every day | RESPIRATORY_TRACT | 6 refills | Status: DC

## 2022-02-16 NOTE — Progress Notes (Signed)
? ?Subjective:  ?Patient ID: Shawn Osborn, male    DOB: [birth year]  Age: 71 y.o. MRN: [id number] ? ?Chief Complaint  ?Patient presents with  ? Hyperlipidemia  ? COPD  ? ? ?HPI ?Diabetes: COPD: On stiolto 2 puffs daily, albuterol HFA 2 puffs four times a day as needed. A1C has been in mid 5s. ?Chronic Pain syndrome: Managed by pain clinic. ON suboxone, lyrica 50 mg three times a day, and allopurinol 300 mg daily. Patient has chronic back pain, gout, knee pain.  ?Alzheimer's Dementia: aricept  and namenda.  ?Depression: zoloft 100 mg once daily. ON seroquel 50 mg before bed, rozerem 8 mg before bed, and hydroxyzine 50 mg before bed. [place]  ?Hypertensive heart disease with CORONARY ARTERY DISEASE/CONGESTIVE HEART FAILURE. On propranolol ER 60 mg daily (also for tremor.), crestor 5 mg before bed.  ?Alzheimer's Dementia: on namenda xr 28 mg before bed and donepizil 10 mg before bed. Helps memory.  ?Vitamin D deficiency: on Vitamin D 50K weekly.  ?GERD: well controlled on omeprazole 40 mg daily.  ?BPH: on tamsulosin 0.4 mg before bed.  ?Neuropathy: on lyrica 50 mg three times a day ? ?Current Outpatient Medications on File Prior to Visit  ?Medication Sig Dispense Refill  ? albuterol (PROVENTIL) (2.5 MG/3ML) 0.083% nebulizer solution Take 3 mLs (2.5 mg total) by nebulization every 6 (six) hours as needed for wheezing or shortness of breath. 120 mL 12  ? albuterol (VENTOLIN HFA) 108 (90 Base) MCG/ACT inhaler Inhale 2 puffs into the lungs every 6 (six) hours as needed for wheezing or shortness of breath. 8 g 3  ? allopurinol (ZYLOPRIM) 300 MG tablet TAKE ONE TABLET BY MOUTH EVERY DAY at 1pm 30 tablet 2  ? buprenorphine-naloxone (SUBOXONE) 8-2 mg SUBL SL tablet Place 1 tablet under the tongue daily.    ? diclofenac Sodium (VOLTAREN) 1 % GEL Apply 2 g topically 4 (four) times daily as needed. 50 g 0  ? donepezil (ARICEPT) 10 MG tablet Take 1 tablet (10 mg total) by mouth at bedtime. 30 tablet 0  ? ferrous sulfate 324 MG TBEC  Take 324 mg by mouth daily with breakfast.    ? furosemide (LASIX) 20 MG tablet Take 1 tablet (20 mg total) by mouth daily as needed. 30 tablet 3  ? hydrOXYzine (ATARAX) 50 MG tablet TAKE ONE TABLET BY MOUTH AT BEDTIME 30 tablet 2  ? memantine (NAMENDA XR) 28 MG CP24 24 hr capsule Take 28 mg by mouth at bedtime.    ? omeprazole (PRILOSEC) 40 MG capsule TAKE ONE CAPSULE BY MOUTH EVERY DAY 30 capsule 2  ? Potassium 99 MG TABS Take 297 mg by mouth daily as needed.     ? propranolol ER (INDERAL LA) 60 MG 24 hr capsule TAKE ONE CAPSULE BY MOUTH EVERY DAY 30 capsule 2  ? ramelteon (ROZEREM) 8 MG tablet Take 1 tablet (8 mg total) by mouth at bedtime. 30 tablet 11  ? rosuvastatin (CRESTOR) 5 MG tablet Take 1 tablet (5 mg total) by mouth at bedtime. 90 tablet 0  ? tamsulosin (FLOMAX) 0.4 MG CAPS capsule TAKE 2 CAPSULES BY MOUTH EVERY EVENING 60 capsule 3  ? Triamcinolone Acetonide (TRIAMCINOLONE 0.1 % CREAM : EUCERIN) CREA Apply 1 application topically 2 (two) times daily. FOR LEGS. 1 each 3  ? Vitamin D, Ergocalciferol, (DRISDOL) 1.25 MG (50000 UNIT) CAPS capsule TAKE ONE CAPSULE BY MOUTH EVERY WEEK 5 capsule 2  ? triamcinolone (KENALOG) 0.1 % Apply 1 application topically 2 (  two) times daily as needed (to ears for itching). 30 g 2  ? ?No current facility-administered medications on file prior to visit.  ? ?Past Medical History:  ?Diagnosis Date  ? Acute combined systolic and diastolic CHF, NYHA class 3 ([place]) 10/08/2019  ? Acute delirium 10/08/2019  ? Acute exacerbation of CHF (congestive heart failure) ([place]) 10/08/2019  ? Acute gout of multiple sites 07/01/2016  ? Acute respiratory failure with hypoxia and hypercapnia ([place]) 10/08/2019  ? Allergy   ? Anxiety   ? Arthritis   ? Gout, osteoarthritis in Back and Knees  ? Cataract   ? CHF (congestive heart failure) ([place])   ? Chronic bronchitis   ? Community acquired pneumonia 10/08/2019  ? COPD (chronic obstructive pulmonary disease) ([place]) 10/08/2019  ? Dementia ([place]) 10/08/2019   ? Elevated troponin 10/08/2019  ? Emphysema of lung ([place])   ? Essential hypertension 07/03/2016  ? GERD (gastroesophageal reflux disease)   ? History of kidney stones   ? HLD (hyperlipidemia) 10/08/2019  ? Hypercapnic respiratory failure ([place]) 10/08/2019  ? Hyperlipidemia   ? Hypertension   ? Hypertensive heart disease 10/08/2019  ? Morbid obesity due to excess calories ([place]) 10/08/2019  ? Narcotic overdose ([place]) 10/08/2019  ? Neuropathy   ? 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 NSTEMI (non-ST elevated myocardial infarction) ([place]) 10/08/2019  ? Obstructive sleep apnea   ? Occlusion and stenosis of carotid artery without mention of cerebral infarction 12/13/2011  ? OSA on CPAP 10/08/2019  ? Pain in joint of left shoulder 01/12/2019  ? Paranoia (psychosis) ([place]) 07/01/2016  ? Poorly-controlled hypertension 10/08/2019  ? Prediabetes   ? Psychotic disorder with delusions ([place]) 07/01/2016  ? Respiratory failure with hypoxia and hypercapnia ([place]) 10/08/2019  ? Restless leg syndrome 07/03/2016  ? Rheumatoid arthritis(714.0)   ? RLS (restless legs syndrome)   ? Sepsis ([place]) 10/08/2019  ? Sleep apnea   ? Spinal stenosis   ? Syncope 04/03/2019  ? Tear of left rotator cuff 01/12/2019  ? Toxic metabolic encephalopathy 38/93/7342  ? UTI (urinary tract infection) 10/08/2019  ? Vascular dementia with behavioral disturbance ([place]) 07/06/2016  ? ?Past Surgical History:  ?Procedure Laterality Date  ? APPENDECTOMY    ? CARPAL TUNNEL RELEASE    ? EYE SURGERY    ? INTRAVASCULAR PRESSURE WIRE/FFR STUDY N/A 10/20/2019  ? Procedure: INTRAVASCULAR PRESSURE WIRE/FFR STUDY;  Surgeon: Belva Crome, MD;  Location: [facility];  Service: Cardiovascular;  Laterality: N/A;  ? LEFT HEART CATH AND CORONARY ANGIOGRAPHY N/A 10/20/2019  ? Procedure: LEFT HEART CATH AND CORONARY ANGIOGRAPHY;  Surgeon: Belva Crome, MD;  Location: [facility];  Service: Cardiovascular;  Laterality: N/A;  ? [place], 2003, 2012  ? Laminectomy X  3  ?  ?Family History  ?Problem Relation Age of Onset  ? Heart disease Mother   ? Hypertension Mother   ? ?Social History  ? ?Socioeconomic History  ? Marital status: Widowed  ?  Spouse name: Not on file  ? Number of children: 4  ? Years of education: Not on file  ? Highest education level: Not on file  ?Occupational History  ? Not on file  ?Tobacco Use  ? Smoking status: Every Day  ?  Packs/day: 0.75  ?  Years: 53.00  ?  Pack years: 39.75  ?  Types: Cigarettes  ? Smokeless tobacco: Current  ?  Types: Chew  ?Vaping Use  ? Vaping Use: Never used  ?  Substance and Sexual Activity  ? Alcohol use: No  ? Drug use: No  ? Sexual activity: Not Currently  ?Other Topics Concern  ? Not on file  ?Social History Narrative  ? Not on file  ? ?Social Determinants of Health  ? ?Financial Resource Strain: Not on file  ?Food Insecurity: Not on file  ?Transportation Needs: Not on file  ?Physical Activity: Not on file  ?Stress: Not on file  ?Social Connections: Not on file  ? ? ?Review of Systems  ?Constitutional:  Negative for chills and fever.  ?HENT:  Negative for congestion, rhinorrhea and sore throat.   ?Respiratory:  Negative for cough and shortness of breath.   ?Cardiovascular:  Negative for chest pain and palpitations.  ?Gastrointestinal:  Negative for abdominal pain, constipation, diarrhea, nausea and vomiting.  ?Genitourinary:  Positive for frequency. Negative for dysuria and urgency.  ?Musculoskeletal:  Positive for back pain. Negative for arthralgias (bilateral knee pain, bilateral hip pain) and myalgias.  ?Neurological:  Positive for weakness. Negative for dizziness and headaches.  ?Psychiatric/Behavioral:  Negative for dysphoric mood. The patient is not nervous/anxious.   ? ? ?Objective:  ?BP 130/64   Pulse 84   Temp (!) 96.7 ?F (35.9 ?C)   Resp 18   Ht '5\' 10"'$  (1.778 m)   Wt 262 lb (118.8 kg)   BMI 37.59 kg/m?  ? ? ?  02/16/2022  ? 11:22 AM 11/16/2021  ?  4:09 PM 10/27/2021  ?  9:10 AM  ?BP/Weight  ?Systolic BP 784 696  295  ?Diastolic BP 64 72 64  ?Wt. (Lbs) 262 249 246  ?BMI 37.59 kg/m2 34.73 kg/m2 34.31 kg/m2  ? ? ?Physical Exam ?Vitals reviewed.  ?Constitutional:   ?   Appearance: Normal appearance. He is obese.  ?Neck:

## 2022-02-17 LAB — CBC WITH DIFFERENTIAL/PLATELET
Basophils Absolute: 0.1 10*3/uL (ref 0.0–0.2)
Basos: 1 %
EOS (ABSOLUTE): 0.7 10*3/uL — ABNORMAL HIGH (ref 0.0–0.4)
Eos: 6 %
Hematocrit: 37.8 % (ref 37.5–51.0)
Hemoglobin: 12.2 g/dL — ABNORMAL LOW (ref 13.0–17.7)
Immature Grans (Abs): 0 10*3/uL (ref 0.0–0.1)
Immature Granulocytes: 0 %
Lymphocytes Absolute: 3 10*3/uL (ref 0.7–3.1)
Lymphs: 28 %
MCH: 26.9 pg (ref 26.6–33.0)
MCHC: 32.3 g/dL (ref 31.5–35.7)
MCV: 83 fL (ref 79–97)
Monocytes Absolute: 0.8 10*3/uL (ref 0.1–0.9)
Monocytes: 7 %
Neutrophils Absolute: 6.4 10*3/uL (ref 1.4–7.0)
Neutrophils: 58 %
Platelets: 246 10*3/uL (ref 150–450)
RBC: 4.53 x10E6/uL (ref 4.14–5.80)
RDW: 16.4 % — ABNORMAL HIGH (ref 11.6–15.4)
WBC: 11 10*3/uL — ABNORMAL HIGH (ref 3.4–10.8)

## 2022-02-17 LAB — LIPID PANEL
Chol/HDL Ratio: 2.9 ratio (ref 0.0–5.0)
Cholesterol, Total: 129 mg/dL (ref 100–199)
HDL: 45 mg/dL (ref >39)
LDL Chol Calc (NIH): 57 mg/dL (ref 0–99)
Triglycerides: 162 mg/dL — ABNORMAL HIGH (ref 0–149)
VLDL Cholesterol Cal: 27 mg/dL (ref 5–40)

## 2022-02-17 LAB — COMPREHENSIVE METABOLIC PANEL
ALT: 13 IU/L (ref 0–44)
AST: 16 IU/L (ref 0–40)
Albumin/Globulin Ratio: 2.2 (ref 1.2–2.2)
Albumin: 4.1 g/dL (ref 3.7–4.7)
Alkaline Phosphatase: 73 IU/L (ref 44–121)
BUN/Creatinine Ratio: 28 — ABNORMAL HIGH (ref 10–24)
BUN: 23 mg/dL (ref 8–27)
Bilirubin Total: <0.2 mg/dL (ref 0.0–1.2)
CO2: 26 mmol/L (ref 20–29)
Calcium: 9.1 mg/dL (ref 8.6–10.2)
Chloride: 106 mmol/L (ref 96–106)
Creatinine, Ser: 0.81 mg/dL (ref 0.76–1.27)
Globulin, Total: 1.9 g/dL (ref 1.5–4.5)
Glucose: 85 mg/dL (ref 70–99)
Potassium: 4.8 mmol/L (ref 3.5–5.2)
Sodium: 145 mmol/L — ABNORMAL HIGH (ref 134–144)
Total Protein: 6 g/dL (ref 6.0–8.5)
eGFR: 94 mL/min/{1.73_m2} (ref >59)

## 2022-02-17 LAB — HEMOGLOBIN A1C
Est. average glucose Bld gHb Est-mCnc: 108 mg/dL
Hgb A1c MFr Bld: 5.4 % (ref 4.8–5.6)

## 2022-02-17 LAB — TSH: TSH: 4.9 u[IU]/mL — ABNORMAL HIGH (ref 0.450–4.500)

## 2022-02-17 LAB — T4, FREE: Free T4: 0.9 ng/dL (ref 0.82–1.77)

## 2022-02-19 NOTE — Progress Notes (Signed)
Blood count fairly stable. ?Liver function normal.  ?Kidney function normal.  ?Thyroid function abnormal. Tsh little up, but T4 is normal. No recommendations.  ?Cholesterol: trigs little up, but overall good.  ?HBA1C: 5.4. great. ?

## 2022-02-27 DIAGNOSIS — I110 Hypertensive heart disease with heart failure: Secondary | ICD-10-CM | POA: Insufficient documentation

## 2022-02-27 NOTE — Assessment & Plan Note (Addendum)
Well controlled.  ?No changes to medicines. Continue crestor. ?Continue to work on eating a healthy diet and exercise.  ?Labs drawn today.  ? ?

## 2022-02-27 NOTE — Assessment & Plan Note (Signed)
Recommend continue to work on eating healthy diet and exercise.  

## 2022-02-27 NOTE — Assessment & Plan Note (Signed)
The current medical regimen is effective;  continue present plan and medications.  

## 2022-02-27 NOTE — Assessment & Plan Note (Addendum)
Hemoglobin A1c 5.6%, 3 month avg of blood sugars, is in prediabetic range.  In order to prevent progression to diabetes, recommend low carb diet and regular exercise  

## 2022-02-27 NOTE — Assessment & Plan Note (Addendum)
Well controlled.  ?No changes to medicines. Continue propranolol ER 60 mg daily (also for tremor.), crestor 5 mg before bed.  ?Continue to work on eating a healthy diet and exercise.  ?Labs drawn today.  ?

## 2022-02-27 NOTE — Assessment & Plan Note (Addendum)
The current medical regimen is effective;  continue present plan and medications. Continue omeprazole 40 mg daily  

## 2022-03-01 ENCOUNTER — Other Ambulatory Visit: Payer: Self-pay | Admitting: Family Medicine

## 2022-03-05 ENCOUNTER — Ambulatory Visit: Payer: Medicare Other

## 2022-03-07 ENCOUNTER — Encounter: Payer: Self-pay | Admitting: Family Medicine

## 2022-03-07 DIAGNOSIS — F1120 Opioid dependence, uncomplicated: Secondary | ICD-10-CM | POA: Insufficient documentation

## 2022-03-07 DIAGNOSIS — G894 Chronic pain syndrome: Secondary | ICD-10-CM | POA: Insufficient documentation

## 2022-03-07 NOTE — Assessment & Plan Note (Signed)
Continue current medications: stiolto, albuterol hfa or nebs.  ?Recommend quit smoking.  ?

## 2022-03-07 NOTE — Assessment & Plan Note (Addendum)
>>  ASSESSMENT AND PLAN FOR CHRONIC RESPIRATORY FAILURE WITH HYPOXIA ([place]) WRITTEN ON 03/07/2022  7:50 PM BY Payam Gribble, MD  Continue oxygen at 2 L  >>ASSESSMENT AND PLAN FOR COPD (CHRONIC OBSTRUCTIVE PULMONARY DISEASE) ([place]) WRITTEN ON 03/07/2022  7:54 PM BY Kannon Baum, MD  Continue current medications: stiolto, albuterol hfa or nebs.  Recommend quit smoking.

## 2022-03-07 NOTE — Assessment & Plan Note (Signed)
Continue crestor 

## 2022-03-07 NOTE — Assessment & Plan Note (Signed)
Pain contract with pain clinic  ?

## 2022-03-07 NOTE — Assessment & Plan Note (Signed)
Management per specialist. 

## 2022-03-07 NOTE — Assessment & Plan Note (Signed)
Compliant with cpap with oxygen bled in.  ?

## 2022-03-07 NOTE — Assessment & Plan Note (Addendum)
Comorbidities: hyperlipidemia, diabetes.  ?Recommend continue to work on eating healthy diet and exercise. ? ?

## 2022-03-20 ENCOUNTER — Telehealth: Payer: Self-pay

## 2022-03-20 NOTE — Telephone Encounter (Signed)
Caryl Pina with [employer] called regarding PA status for Hydroxyzine. Stated medication was denied due to patient not meeting criteria.. "side effects have not been discussed with patient". An appeal can be submitted once the side effects are discussed with patient. I did inform her that they indeed have been discussed. However, when the PA was submitted the box checked was one stating side effects had not been discussed. ?

## 2022-03-21 ENCOUNTER — Other Ambulatory Visit: Payer: Self-pay

## 2022-03-21 ENCOUNTER — Encounter: Payer: Self-pay | Admitting: Family Medicine

## 2022-03-21 MED ORDER — TRIAMCINOLONE ACETONIDE 0.1 % EX CREA
1.0000 | TOPICAL_CREAM | Freq: Two times a day (BID) | CUTANEOUS | 2 refills | Status: DC | PRN
Start: 2022-03-21 — End: 2023-11-23

## 2022-03-21 MED ORDER — FUROSEMIDE 20 MG PO TABS: 20.0000 mg | ORAL_TABLET | Freq: Every day | ORAL | 3 refills | Status: DC | PRN

## 2022-03-21 NOTE — Telephone Encounter (Signed)
Shawn Osborn has been using the hydroxyzine and would like to continue.  He was informed of side effects and risk of fall.   ?

## 2022-03-27 ENCOUNTER — Telehealth: Payer: Self-pay

## 2022-03-27 NOTE — Telephone Encounter (Signed)
[employer] called stating that the appeal was denied for not showing the risks and side effects were verified with patient. If have any questions call Miesha A at the following [phone]. ?

## 2022-03-27 NOTE — Telephone Encounter (Signed)
Call [name] and see if she would like to check cost out of pocket for hydroxyzine as his insurance denied my appeal letter. Dr Tobie Poet  ?

## 2022-03-27 NOTE — Telephone Encounter (Signed)
Called patient's daughter [name], and she stated that she will call pharmacy tomorrow morning and call us back and let us know the out of pocket cost and if it is affordable. [place] ?

## 2022-03-28 NOTE — Telephone Encounter (Signed)
Patients daughter called 

## 2022-03-28 NOTE — Telephone Encounter (Signed)
Patient's daughter called back and stated that she called the pharmacy and the medication was only going to be 15$ which was affordable to patient. ?

## 2022-03-29 ENCOUNTER — Other Ambulatory Visit: Payer: Self-pay

## 2022-03-29 ENCOUNTER — Telehealth: Payer: Self-pay

## 2022-03-29 DIAGNOSIS — G8929 Other chronic pain: Secondary | ICD-10-CM | POA: Diagnosis not present

## 2022-03-29 DIAGNOSIS — F1190 Opioid use, unspecified, uncomplicated: Secondary | ICD-10-CM | POA: Diagnosis not present

## 2022-03-29 DIAGNOSIS — M545 Low back pain, unspecified: Secondary | ICD-10-CM | POA: Diagnosis not present

## 2022-03-29 DIAGNOSIS — M25561 Pain in right knee: Secondary | ICD-10-CM | POA: Diagnosis not present

## 2022-03-29 DIAGNOSIS — Z79899 Other long term (current) drug therapy: Secondary | ICD-10-CM | POA: Diagnosis not present

## 2022-03-29 MED ORDER — STIOLTO RESPIMAT 2.5-2.5 MCG/ACT IN AERS: 2.0000 | INHALATION_SPRAY | Freq: Every day | RESPIRATORY_TRACT | 6 refills | Status: DC

## 2022-03-29 NOTE — Telephone Encounter (Signed)
VM left by insurance stating appeal was overturned for Hydroxyzine. Approved from 03/20/2022-03/30/2023. ?

## 2022-03-31 ENCOUNTER — Other Ambulatory Visit: Payer: Self-pay | Admitting: Family Medicine

## 2022-03-31 DIAGNOSIS — M1A09X0 Idiopathic chronic gout, multiple sites, without tophus (tophi): Secondary | ICD-10-CM

## 2022-04-02 DIAGNOSIS — Z79899 Other long term (current) drug therapy: Secondary | ICD-10-CM | POA: Diagnosis not present

## 2022-04-05 ENCOUNTER — Other Ambulatory Visit: Payer: Self-pay | Admitting: Family Medicine

## 2022-04-05 NOTE — Telephone Encounter (Signed)
Refill sent to pharmacy.   

## 2022-04-12 ENCOUNTER — Telehealth: Payer: Self-pay

## 2022-04-12 NOTE — Telephone Encounter (Signed)
AWVI scheduled for 04/17/2022 with Maudie Mercury.   Patient stated that his last visit, there was an issue with the phone.   Patient stated there was no other number to call in the event his cell was not working.

## 2022-04-17 ENCOUNTER — Ambulatory Visit (INDEPENDENT_AMBULATORY_CARE_PROVIDER_SITE_OTHER): Payer: Medicare Other | Admitting: Family Medicine

## 2022-04-17 DIAGNOSIS — Z0000 Encounter for general adult medical examination without abnormal findings: Secondary | ICD-10-CM

## 2022-04-17 NOTE — Progress Notes (Signed)
Subjective:   Shawn Osborn is a 71 y.o. male who presents for Medicare Annual/Subsequent preventive examination.  I connected with  Shawn Osborn on 04/17/22 by a audio enabled telemedicine application and verified that I am speaking with the correct person using two identifiers.  Patient Location: Home  Provider Location: Office/Clinic  I discussed the limitations of evaluation and management by telemedicine. The patient expressed understanding and agreed to proceed.   Cardiac Risk Factors include: advanced age (>30mn, >>53women);male gender;obesity (BMI >30kg/m2);smoking/ tobacco exposure     Objective:    Today's Vitals   04/17/22 1011  PainSc: 4    There is no height or weight on file to calculate BMI.     08/31/2020   12:00 PM 10/20/2019    7:47 AM 12/04/2018    2:15 PM  Advanced Directives  Does Patient Have a Medical Advance Directive? No No No  Would patient like information on creating a medical advance directive? No - Patient declined No - Patient declined Yes (MAU/Ambulatory/Procedural Areas - Information given)    Current Medications (verified) Outpatient Encounter Medications as of 04/17/2022  Medication Sig   albuterol (PROVENTIL) (2.5 MG/3ML) 0.083% nebulizer solution Take 3 mLs (2.5 mg total) by nebulization every 6 (six) hours as needed for wheezing or shortness of breath.   albuterol (VENTOLIN HFA) 108 (90 Base) MCG/ACT inhaler Inhale 2 puffs into the lungs every 6 (six) hours as needed for wheezing or shortness of breath.   allopurinol (ZYLOPRIM) 300 MG tablet TAKE ONE TABLET BY MOUTH EVERY DAY at 1pm   buprenorphine-naloxone (SUBOXONE) 8-2 mg SUBL SL tablet Place 1 tablet under the tongue daily.   diclofenac Sodium (VOLTAREN) 1 % GEL Apply 2 g topically 4 (four) times daily as needed.   donepezil (ARICEPT) 10 MG tablet Take 1 tablet (10 mg total) by mouth at bedtime.   ferrous sulfate 324 MG TBEC Take 324 mg by mouth daily with breakfast.    furosemide (LASIX) 20 MG tablet Take 1 tablet (20 mg total) by mouth daily as needed.   hydrOXYzine (ATARAX) 50 MG tablet TAKE ONE TABLET BY MOUTH AT BEDTIME   memantine (NAMENDA XR) 28 MG CP24 24 hr capsule Take 28 mg by mouth at bedtime.   omeprazole (PRILOSEC) 40 MG capsule TAKE ONE CAPSULE BY MOUTH EVERY DAY   Potassium 99 MG TABS Take 297 mg by mouth daily as needed.    pregabalin (LYRICA) 50 MG capsule TAKE ONE CAPSULE BY MOUTH EVERY DAY and TAKE ONE CAPSULE BY MOUTH EVERY DAY at 1pm and TAKE ONE CAPSULE BY MOUTH AT BEDTIME   propranolol ER (INDERAL LA) 60 MG 24 hr capsule TAKE ONE CAPSULE BY MOUTH EVERY DAY   QUEtiapine (SEROQUEL) 50 MG tablet TAKE ONE TABLET BY MOUTH AT BEDTIME   ramelteon (ROZEREM) 8 MG tablet Take 1 tablet (8 mg total) by mouth at bedtime.   rosuvastatin (CRESTOR) 5 MG tablet Take 1 tablet (5 mg total) by mouth at bedtime.   sertraline (ZOLOFT) 100 MG tablet TAKE ONE TABLET BY MOUTH AT BEDTIME   tamsulosin (FLOMAX) 0.4 MG CAPS capsule TAKE 2 CAPSULES BY MOUTH EVERY EVENING   Tiotropium Bromide-Olodaterol (STIOLTO RESPIMAT) 2.5-2.5 MCG/ACT AERS Inhale 2 puffs into the lungs daily.   Triamcinolone Acetonide (TRIAMCINOLONE 0.1 % CREAM : EUCERIN) CREA Apply 1 application topically 2 (two) times daily. FOR LEGS.   triamcinolone cream (KENALOG) 0.1 % Apply 1 application. topically 2 (two) times daily as needed (to ears for  itching).   Vitamin D, Ergocalciferol, (DRISDOL) 1.25 MG (50000 UNIT) CAPS capsule TAKE ONE CAPSULE BY MOUTH EVERY WEEK   No facility-administered encounter medications on file as of 04/17/2022.    Allergies (verified) Avelox [moxifloxacin hcl in nacl], Codeine phosphate, Cozaar, Cymbalta [duloxetine hcl], Ropinirole, Statins, Welchol [colesevelam hcl], and Penicillins   History: Past Medical History:  Diagnosis Date   Acute combined systolic and diastolic CHF, NYHA class 3 ([place]) 10/08/2019   Acute delirium 10/08/2019   Acute exacerbation of CHF  (congestive heart failure) ([place]) 10/08/2019   Acute gout of multiple sites 07/01/2016   Acute respiratory failure with hypoxia and hypercapnia (HCC) 10/08/2019   Allergy    Anxiety    Arthritis    Gout, osteoarthritis in Back and Knees   Cataract    CHF (congestive heart failure) (HCC)    Chronic bronchitis    Community acquired pneumonia 10/08/2019   COPD (chronic obstructive pulmonary disease) ([place]) 10/08/2019   Dementia ([place]) 10/08/2019   Elevated troponin 10/08/2019   Emphysema of lung ([place])    Essential hypertension 07/03/2016   GERD (gastroesophageal reflux disease)    History of kidney stones    HLD (hyperlipidemia) 10/08/2019   Hypercapnic respiratory failure ([place]) 10/08/2019   Hyperlipidemia    Hypertension    Hypertensive heart disease 10/08/2019   Morbid obesity due to excess calories ([place]) 10/08/2019   Narcotic overdose ([place]) 10/08/2019   Neuropathy    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   NSTEMI (non-ST elevated myocardial infarction) ([place]) 10/08/2019   Obstructive sleep apnea    Occlusion and stenosis of carotid artery without mention of cerebral infarction 12/13/2011   OSA on CPAP 10/08/2019   Pain in joint of left shoulder 01/12/2019   Paranoia (psychosis) ([place]) 07/01/2016   Poorly-controlled hypertension 10/08/2019   Prediabetes    Psychotic disorder with delusions ([place]) 07/01/2016   Respiratory failure with hypoxia and hypercapnia ([place]) 10/08/2019   Restless leg syndrome 07/03/2016   Rheumatoid arthritis(714.0)    RLS (restless legs syndrome)    Sepsis ([place]) 10/08/2019   Sleep apnea    Spinal stenosis    Syncope 04/03/2019   Tear of left rotator cuff 11/21/7251   Toxic metabolic encephalopathy 66/44/0347   UTI (urinary tract infection) 10/08/2019   Vascular dementia with behavioral disturbance ([place]) 07/06/2016   Past Surgical History:  Procedure Laterality Date   APPENDECTOMY     CARPAL TUNNEL RELEASE     EYE SURGERY     INTRAVASCULAR PRESSURE  WIRE/FFR STUDY N/A 10/20/2019   Procedure: INTRAVASCULAR PRESSURE WIRE/FFR STUDY;  Surgeon: Belva Crome, MD;  Location: [facility];  Service: Cardiovascular;  Laterality: N/A;   LEFT HEART CATH AND CORONARY ANGIOGRAPHY N/A 10/20/2019   Procedure: LEFT HEART CATH AND CORONARY ANGIOGRAPHY;  Surgeon: Belva Crome, MD;  Location: [facility];  Service: Cardiovascular;  Laterality: N/A;   [place], 2003, 2012   Laminectomy X 3   Family History  Problem Relation Age of Onset   Heart disease Mother    Hypertension Mother    Social History   Socioeconomic History   Marital status: Widowed    Spouse name: Not on file   Number of children: 4   Years of education: Not on file   Highest education level: Not on file  Occupational History   Not on file  Tobacco Use   Smoking status: Every Day    Packs/day: 0.75    Years:  53.00    Pack years: 39.75    Types: Cigarettes   Smokeless tobacco: Current    Types: Chew  Vaping Use   Vaping Use: Never used  Substance and Sexual Activity   Alcohol use: No   Drug use: No   Sexual activity: Not Currently  Other Topics Concern   Not on file  Social History Narrative   Not on file   Social Determinants of Health   Financial Resource Strain: Not on file  Food Insecurity: Not on file  Transportation Needs: Not on file  Physical Activity: Not on file  Stress: Not on file  Social Connections: Not on file    Tobacco Counseling Ready to quit: Not Answered Counseling given: Not Answered   Clinical Intake:  Pre-visit preparation completed: Yes  Pain : 0-10 Pain Score: 4  Pain Location: Other (Comment) (back, left hand) Pain Descriptors / Indicators: Aching Pain Frequency: Intermittent Effect of Pain on Daily Activities: moderate   BMI - recorded: 37.59 Nutritional Status: BMI > 30  Obese Nutritional Risks: None How often do you need to have someone help you when you read instructions, pamphlets, or other  written materials from your doctor or pharmacy?: 4 - Often Interpreter Needed?: No    Activities of Daily Living    04/17/2022    1:59 PM 11/16/2021    4:07 PM  In your present state of health, do you have any difficulty performing the following activities:  Hearing? 0 0  Vision? 0 0  Difficulty concentrating or making decisions? 1 0  Walking or climbing stairs? 1 1  Comment  Ambulates and with a walker.  Dressing or bathing? 0 0  Doing errands, shopping? 1 1  Comment  Patient has transporstation help.  Preparing Food and eating ? N   Using the Toilet? N   In the past six months, have you accidently leaked urine? N   Do you have problems with loss of bowel control? N   Managing your Medications? N   Managing your Finances? N   Housekeeping or managing your Housekeeping? N     Patient Care Team: Rochel Brome, MD as PCP - General (Internal Medicine) Berniece Salines, DO as PCP - Cardiology (Cardiology)     Assessment:   This is a routine wellness examination for [place].  Hearing/Vision screen No results found.  Dietary issues and exercise activities discussed: Current Exercise Habits: The patient does not participate in regular exercise at present  Depression Screen    04/17/2022    1:57 PM 02/16/2022   11:21 AM 10/27/2021    9:00 AM 03/27/2021   11:20 AM 12/13/2020   11:02 AM 02/08/2020   11:34 AM 12/04/2018    2:39 PM  PHQ 2/9 Scores  PHQ - 2 Score 0 0 0 0 0 0 0    Fall Risk    04/17/2022    1:58 PM 02/16/2022   11:22 AM 10/27/2021    8:59 AM 03/27/2021   11:20 AM 02/06/2021    1:38 PM  [place] in the past year? 0 0 0 0 0  Number falls in past yr: 0 0 0 0 0  Injury with Fall? 0 0  0 0  Risk for fall due to : Impaired balance/gait;Medication side effect    Impaired balance/gait;History of fall(s);Impaired mobility  Follow up Falls evaluation completed;Education provided;Falls prevention discussed Falls evaluation completed Falls evaluation completed       FALL RISK PREVENTION PERTAINING TO  THE HOME:  Any stairs in or around the home? Yes  - patient uses the ramp If so, are there any without handrails? No  Home free of loose throw rugs in walkways, pet beds, electrical cords, etc? Yes  Adequate lighting in your home to reduce risk of falls? Yes   ASSISTIVE DEVICES UTILIZED TO PREVENT FALLS:  Use of a cane, walker or w/c? Yes  uses rollator - sits in it and pushes himself around in it Grab bars in the bathroom? Yes  Shower chair or bench in shower? Yes  Elevated toilet seat or a handicapped toilet? Yes    Cognitive Function:    03/27/2021   11:32 AM  MMSE - Mini Mental State Exam  Orientation to time 4  Orientation to Place 5  Registration 3  Attention/ Calculation 0  Recall 0  Language- name 2 objects 2  Language- repeat 1  Language- follow 3 step command 3  Language- read & follow direction 1  Write a sentence 1  Copy design 0  Total score 20        04/17/2022   10:19 AM  6CIT Screen  What Year? 0 points  What month? 0 points  What time? 0 points  Count back from 20 2 points  Months in reverse 4 points  Repeat phrase 2 points  Total Score 8 points    Immunizations Immunization History  Administered Date(s) Administered   Fluad Quad(high Dose 65+) 08/15/2020   Influenza, Seasonal, Injecte, Preservative Fre 08/19/2018   Influenza-Unspecified 08/19/2018   Pneumococcal Conjugate-13 08/19/2017   Pneumococcal Polysaccharide-23 08/20/2015    TDAP status: Due, Education has been provided regarding the importance of this vaccine. Advised may receive this vaccine at local pharmacy or Health Dept. Aware to provide a copy of the vaccination record if obtained from local pharmacy or Health Dept. Verbalized acceptance and understanding.  Flu Vaccine status: Due, Education has been provided regarding the importance of this vaccine. Advised may receive this vaccine at local pharmacy or Health Dept. Aware to provide a copy  of the vaccination record if obtained from local pharmacy or Health Dept. Verbalized acceptance and understanding.  Pneumococcal vaccine status: Up to date  Covid-19 vaccine status: Declined, Education has been provided regarding the importance of this vaccine but patient still declined. Advised may receive this vaccine at local pharmacy or Health Dept.or vaccine clinic. Aware to provide a copy of the vaccination record if obtained from local pharmacy or Health Dept. Verbalized acceptance and understanding.  Qualifies for Shingles Vaccine? Yes   Zostavax completed No   Shingrix Completed?: No.    Education has been provided regarding the importance of this vaccine. Patient has been advised to call insurance company to determine out of pocket expense if they have not yet received this vaccine. Advised may also receive vaccine at local pharmacy or Health Dept. Verbalized acceptance and understanding.  Screening Tests Health Maintenance  Topic Date Due   COVID-19 Vaccine (1) Never done   URINE MICROALBUMIN  Never done   Hepatitis C Screening  Never done   TETANUS/TDAP  Never done   Zoster Vaccines- Shingrix (1 of 2) Never done   COLONOSCOPY (Pts 45-75yr Insurance coverage will need to be confirmed)  09/16/2015   INFLUENZA VACCINE  06/19/2022   Pneumonia Vaccine 71 Years old  Completed   HPV VACCINES  Aged Out    Health Maintenance  Health Maintenance Due  Topic Date Due   COVID-19 Vaccine (1) Never done  URINE MICROALBUMIN  Never done   Hepatitis C Screening  Never done   TETANUS/TDAP  Never done   Zoster Vaccines- Shingrix (1 of 2) Never done   COLONOSCOPY (Pts 45-31yr Insurance coverage will need to be confirmed)  09/16/2015    Colorectal cancer screening: Type of screening: Colonoscopy. Completed 2015. DUE   Additional Screening:  Vision Screening: Recommended annual ophthalmology exams for early detection of glaucoma and other disorders of the eye. Is the patient up to  date with their annual eye exam?  No   Dental Screening: Recommended annual dental exams for proper oral hygiene     Plan:    1- Suggested eval for mobile wheel chair - patient uses rollator to set on and roll around home. 2- Recommended earlier appointment with provider - he states that he feels like his dementia is getting worse and he is having hallucinations -- patient has appointment scheduled for July and will call back to schedule earlier if it gets worse 3- Colonoscopy declined 4- Vaccines declined at this time  I have personally reviewed and noted the following in the patient's chart:   Medical and social history Use of alcohol, tobacco or illicit drugs  Current medications and supplements including opioid prescriptions.  Functional ability and status Nutritional status Physical activity Advanced directives List of other physicians Hospitalizations, surgeries, and ER visits in previous 12 months Vitals Screenings to include cognitive, depression, and falls Referrals and appointments  In addition, I have reviewed and discussed with patient certain preventive protocols, quality metrics, and best practice recommendations. A written personalized care plan for preventive services as well as general preventive health recommendations were provided to patient.     KErie Noe LPN   [id number]

## 2022-04-17 NOTE — Patient Instructions (Signed)
[name] , Thank you for taking time to come for your Medicare Wellness Visit. I appreciate your ongoing commitment to your health goals. Please review the following plan we discussed and let me know if I can assist you in the future.   Screening recommendations/referrals: Colonoscopy: DUE Recommended yearly ophthalmology/optometry visit for glaucoma screening and checkup Recommended yearly dental visit for hygiene and checkup  Vaccinations: Influenza vaccine: DUE FALL 2023 Pneumococcal vaccine: Completed Tdap vaccine: Due  Shingles vaccine: Due    Conditions/risks identified: Dementia/Hallucinations - schedule appointment with provider earlier than July 7   Preventive Care 41 Years and Older, Male Preventive care refers to lifestyle choices and visits with your health care provider that can promote health and wellness. What does preventive care include? A yearly physical exam. This is also called an annual well check. Dental exams once or twice a year. Routine eye exams. Ask your health care provider how often you should have your eyes checked. Personal lifestyle choices, including: Daily care of your teeth and gums. Regular physical activity. Eating a healthy diet. Avoiding tobacco and drug use. Limiting alcohol use. Practicing safe sex. Taking low doses of aspirin every day. Taking vitamin and mineral supplements as recommended by your health care provider. What happens during an annual well check? The services and screenings done by your health care provider during your annual well check will depend on your age, overall health, lifestyle risk factors, and family history of disease. Counseling  Your health care provider may ask you questions about your: Alcohol use. Tobacco use. Drug use. Emotional well-being. Home and relationship well-being. Sexual activity. Eating habits. History of falls. Memory and ability to understand (cognition). Work and work  Statistician. Screening  You may have the following tests or measurements: Height, weight, and BMI. Blood pressure. Lipid and cholesterol levels. These may be checked every 5 years, or more frequently if you are over 79 years old. Skin check. Lung cancer screening. You may have this screening every year starting at age 45 if you have a 30-pack-year history of smoking and currently smoke or have quit within the past 15 years. Fecal occult blood test (FOBT) of the stool. You may have this test every year starting at age 107. Flexible sigmoidoscopy or colonoscopy. You may have a sigmoidoscopy every 5 years or a colonoscopy every 10 years starting at age 42. Prostate cancer screening. Recommendations will vary depending on your family history and other risks. Hepatitis C blood test. Hepatitis B blood test. Sexually transmitted disease (STD) testing. Diabetes screening. This is done by checking your blood sugar (glucose) after you have not eaten for a while (fasting). You may have this done every 1-3 years. Abdominal aortic aneurysm (AAA) screening. You may need this if you are a current or former smoker. Osteoporosis. You may be screened starting at age 29 if you are at high risk. Talk with your health care provider about your test results, treatment options, and if necessary, the need for more tests. Vaccines  Your health care provider may recommend certain vaccines, such as: Influenza vaccine. This is recommended every year. Tetanus, diphtheria, and acellular pertussis (Tdap, Td) vaccine. You may need a Td booster every 10 years. Zoster vaccine. You may need this after age 5. Pneumococcal 13-valent conjugate (PCV13) vaccine. One dose is recommended after age 24. Pneumococcal polysaccharide (PPSV23) vaccine. One dose is recommended after age 39. Talk to your health care provider about which screenings and vaccines you need and how often you need them.  This information is not intended to replace  advice given to you by your health care provider. Make sure you discuss any questions you have with your health care provider. Document Released: 12/02/2015 Document Revised: 07/25/2016 Document Reviewed: 09/06/2015 Elsevier Interactive Patient Education  2017 [employer] Prevention in the Home Falls can cause injuries. They can happen to people of all ages. There are many things you can do to make your home safe and to help prevent falls. What can I do on the outside of my home? Regularly fix the edges of walkways and driveways and fix any cracks. Remove anything that might make you trip as you walk through a door, such as a raised step or threshold. Trim any bushes or trees on the path to your home. Use bright outdoor lighting. Clear any walking paths of anything that might make someone trip, such as rocks or tools. Regularly check to see if handrails are loose or broken. Make sure that both sides of any steps have handrails. Any raised decks and porches should have guardrails on the edges. Have any leaves, snow, or ice cleared regularly. Use sand or salt on walking paths during winter. Clean up any spills in your garage right away. This includes oil or grease spills. What can I do in the bathroom? Use night lights. Install grab bars by the toilet and in the tub and shower. Do not use towel bars as grab bars. Use non-skid mats or decals in the tub or shower. If you need to sit down in the shower, use a plastic, non-slip stool. Keep the floor dry. Clean up any water that spills on the floor as soon as it happens. Remove soap buildup in the tub or shower regularly. Attach bath mats securely with double-sided non-slip rug tape. Do not have throw rugs and other things on the floor that can make you trip. What can I do in the bedroom? Use night lights. Make sure that you have a light by your bed that is easy to reach. Do not use any sheets or blankets that are too big for your bed.  They should not hang down onto the floor. Have a firm chair that has side arms. You can use this for support while you get dressed. Do not have throw rugs and other things on the floor that can make you trip. What can I do in the kitchen? Clean up any spills right away. Avoid walking on wet floors. Keep items that you use a lot in easy-to-reach places. If you need to reach something above you, use a strong step stool that has a grab bar. Keep electrical cords out of the way. Do not use floor polish or wax that makes floors slippery. If you must use wax, use non-skid floor wax. Do not have throw rugs and other things on the floor that can make you trip. What can I do with my stairs? Do not leave any items on the stairs. Make sure that there are handrails on both sides of the stairs and use them. Fix handrails that are broken or loose. Make sure that handrails are as long as the stairways. Check any carpeting to make sure that it is firmly attached to the stairs. Fix any carpet that is loose or worn. Avoid having throw rugs at the top or bottom of the stairs. If you do have throw rugs, attach them to the floor with carpet tape. Make sure that you have a light switch at the  top of the stairs and the bottom of the stairs. If you do not have them, ask someone to add them for you. What else can I do to help prevent falls? Wear shoes that: Do not have high heels. Have rubber bottoms. Are comfortable and fit you well. Are closed at the toe. Do not wear sandals. If you use a stepladder: Make sure that it is fully opened. Do not climb a closed stepladder. Make sure that both sides of the stepladder are locked into place. Ask someone to hold it for you, if possible. Clearly mark and make sure that you can see: Any grab bars or handrails. First and last steps. Where the edge of each step is. Use tools that help you move around (mobility aids) if they are needed. These  include: Canes. Walkers. Scooters. Crutches. Turn on the lights when you go into a dark area. Replace any light bulbs as soon as they burn out. Set up your furniture so you have a clear path. Avoid moving your furniture around. If any of your floors are uneven, fix them. If there are any pets around you, be aware of where they are. Review your medicines with your doctor. Some medicines can make you feel dizzy. This can increase your chance of falling. Ask your doctor what other things that you can do to help prevent falls. This information is not intended to replace advice given to you by your health care provider. Make sure you discuss any questions you have with your health care provider. Document Released: 09/01/2009 Document Revised: 04/12/2016 Document Reviewed: 12/10/2014 Elsevier Interactive Patient Education  2017 [employer].

## 2022-04-19 ENCOUNTER — Other Ambulatory Visit: Payer: Self-pay

## 2022-04-19 MED ORDER — PREGABALIN 50 MG PO CAPS
ORAL_CAPSULE | ORAL | 2 refills | Status: DC
Start: 2022-04-19 — End: 2022-07-02

## 2022-04-19 NOTE — Telephone Encounter (Signed)
Prevo requesting refill. Stating instructions are to be three times a day. Please advise and send.   Royce Macadamia, [employer] 04/19/22 4:20 PM

## 2022-04-20 ENCOUNTER — Other Ambulatory Visit: Payer: Self-pay

## 2022-04-20 MED ORDER — STIOLTO RESPIMAT 2.5-2.5 MCG/ACT IN AERS: 2.0000 | INHALATION_SPRAY | Freq: Every day | RESPIRATORY_TRACT | 6 refills | Status: DC

## 2022-04-26 DIAGNOSIS — J449 Chronic obstructive pulmonary disease, unspecified: Secondary | ICD-10-CM | POA: Diagnosis not present

## 2022-04-27 ENCOUNTER — Other Ambulatory Visit: Payer: Self-pay | Admitting: Family Medicine

## 2022-04-27 ENCOUNTER — Other Ambulatory Visit: Payer: Self-pay | Admitting: Physician Assistant

## 2022-04-27 DIAGNOSIS — R351 Nocturia: Secondary | ICD-10-CM

## 2022-05-01 ENCOUNTER — Encounter: Payer: Self-pay | Admitting: Family Medicine

## 2022-05-01 ENCOUNTER — Ambulatory Visit (INDEPENDENT_AMBULATORY_CARE_PROVIDER_SITE_OTHER): Payer: Medicare Other | Admitting: Family Medicine

## 2022-05-01 VITALS — BP 130/70 | HR 78 | Temp 96.2°F | Resp 14 | Ht 71.0 in | Wt 271.0 lb

## 2022-05-01 DIAGNOSIS — R441 Visual hallucinations: Secondary | ICD-10-CM | POA: Diagnosis not present

## 2022-05-01 DIAGNOSIS — R440 Auditory hallucinations: Secondary | ICD-10-CM | POA: Diagnosis not present

## 2022-05-01 DIAGNOSIS — F1120 Opioid dependence, uncomplicated: Secondary | ICD-10-CM

## 2022-05-01 DIAGNOSIS — R413 Other amnesia: Secondary | ICD-10-CM | POA: Insufficient documentation

## 2022-05-01 DIAGNOSIS — F01518 Vascular dementia, unspecified severity, with other behavioral disturbance: Secondary | ICD-10-CM

## 2022-05-01 DIAGNOSIS — K5904 Chronic idiopathic constipation: Secondary | ICD-10-CM

## 2022-05-01 HISTORY — DX: Visual hallucinations: R44.1

## 2022-05-01 HISTORY — DX: Auditory hallucinations: R44.0

## 2022-05-01 LAB — POCT URINALYSIS DIP (CLINITEK)
Bilirubin, UA: NEGATIVE
Blood, UA: NEGATIVE
Glucose, UA: NEGATIVE mg/dL
Ketones, POC UA: NEGATIVE mg/dL
Leukocytes, UA: NEGATIVE
Nitrite, UA: NEGATIVE
POC PROTEIN,UA: NEGATIVE
Spec Grav, UA: 1.015 (ref 1.010–1.025)
Urobilinogen, UA: NEGATIVE E.U./dL — AB
pH, UA: 6.5 (ref 5.0–8.0)

## 2022-05-01 MED ORDER — QUETIAPINE FUMARATE 100 MG PO TABS: 100.0000 mg | ORAL_TABLET | Freq: Every day | ORAL | 2 refills | Status: DC

## 2022-05-01 MED ORDER — FUROSEMIDE 40 MG PO TABS: 40.0000 mg | ORAL_TABLET | Freq: Two times a day (BID) | ORAL | 1 refills | Status: DC

## 2022-05-01 NOTE — Assessment & Plan Note (Signed)
Order MRI of brain 

## 2022-05-01 NOTE — Assessment & Plan Note (Signed)
Chronic pain.  Suboxone clinic

## 2022-05-01 NOTE — Addendum Note (Signed)
Addended byRochel Brome on: 05/01/2022 10:51 PM   Modules accepted: Orders

## 2022-05-01 NOTE — Assessment & Plan Note (Signed)
Increase Seroquel 100 mg nightly.

## 2022-05-01 NOTE — Progress Notes (Addendum)
Acute Office Visit  Subjective:    Patient ID: Shawn Osborn, male    DOB: 1951-04-01, 71 y.o.   MRN: [id number]  Chief Complaint  Patient presents with   Hallucinations    HPI: Patient is a 71 year old white male with past medical history significant for COPD, chronic pain syndrome, hypertensive heart disease with diastolic heart failure, hyperlipidemia, prediabetes and dementia who presents for worsening memory loss and increase in hallucinations (visual and auditory.) Seen at the [facility] clinic. Increased dose about 2-3 month from pain clinic.  No other changes to medications other than the fact that the patient increase his Lasix from 20 to 40 mg daily because he was having increasing swelling in his legs.  His legs are also weeping. Patient reports his blood pressure has been low yesterday and today. BP 92/64 then 100/54.  Patient denies any fever, chills, URI symptoms and no other infectious symptoms.  Past Medical History:  Diagnosis Date   Acute combined systolic and diastolic CHF, NYHA class 3 (HCC) 10/08/2019   Acute delirium 10/08/2019   Acute exacerbation of CHF (congestive heart failure) ([place]) 10/08/2019   Acute gout of multiple sites 07/01/2016   Acute respiratory failure with hypoxia and hypercapnia (HCC) 10/08/2019   Allergy    Anxiety    Arthritis    Gout, osteoarthritis in Back and Knees   Cataract    CHF (congestive heart failure) (HCC)    Chronic bronchitis    Community acquired pneumonia 10/08/2019   COPD (chronic obstructive pulmonary disease) ([place]) 10/08/2019   Dementia ([place]) 10/08/2019   Elevated troponin 10/08/2019   Emphysema of lung ([place])    Essential hypertension 07/03/2016   GERD (gastroesophageal reflux disease)    History of kidney stones    HLD (hyperlipidemia) 10/08/2019   Hypercapnic respiratory failure ([place]) 10/08/2019   Hyperlipidemia    Hypertension    Hypertensive heart disease 10/08/2019   Morbid obesity due to excess calories  ([place]) 10/08/2019   Narcotic overdose ([place]) 10/08/2019   Neuropathy    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   NSTEMI (non-ST elevated myocardial infarction) ([place]) 10/08/2019   Obstructive sleep apnea    Occlusion and stenosis of carotid artery without mention of cerebral infarction 12/13/2011   OSA on CPAP 10/08/2019   Pain in joint of left shoulder 01/12/2019   Paranoia (psychosis) ([place]) 07/01/2016   Poorly-controlled hypertension 10/08/2019   Prediabetes    Psychotic disorder with delusions ([place]) 07/01/2016   Respiratory failure with hypoxia and hypercapnia ([place]) 10/08/2019   Restless leg syndrome 07/03/2016   Rheumatoid arthritis(714.0)    RLS (restless legs syndrome)    Sepsis ([place]) 10/08/2019   Sleep apnea    Spinal stenosis    Syncope 04/03/2019   Tear of left rotator cuff 07/02/4817   Toxic metabolic encephalopathy 56/31/4970   UTI (urinary tract infection) 10/08/2019   Vascular dementia with behavioral disturbance ([place]) 07/06/2016    Past Surgical History:  Procedure Laterality Date   APPENDECTOMY     CARPAL TUNNEL RELEASE     EYE SURGERY     INTRAVASCULAR PRESSURE WIRE/FFR STUDY N/A 10/20/2019   Procedure: INTRAVASCULAR PRESSURE WIRE/FFR STUDY;  Surgeon: Belva Crome, MD;  Location: [place] [facility];  Service: Cardiovascular;  Laterality: N/A;   LEFT HEART CATH AND CORONARY ANGIOGRAPHY N/A 10/20/2019   Procedure: LEFT HEART CATH AND CORONARY ANGIOGRAPHY;  Surgeon: Belva Crome, MD;  Location: [place] [facility];  Service: Cardiovascular;  Laterality: N/A;  [place], 2003, 2012   Laminectomy X 3    Family History  Problem Relation Age of Onset   Heart disease Mother    Hypertension Mother     Social History   Socioeconomic History   Marital status: Widowed    Spouse name: Not on file   Number of children: 4   Years of education: Not on file   Highest education level: Not on file  Occupational History   Not on file  Tobacco Use   Smoking  status: Every Day    Packs/day: 0.75    Years: 53.00    Total pack years: 39.75    Types: Cigarettes   Smokeless tobacco: Current    Types: Chew  Vaping Use   Vaping Use: Never used  Substance and Sexual Activity   Alcohol use: No   Drug use: No   Sexual activity: Not Currently  Other Topics Concern   Not on file  Social History Narrative   Not on file   Social Determinants of Health   Financial Resource Strain: Not on file  Food Insecurity: No Food Insecurity (03/23/2020)   Hunger Vital Sign    Worried About Running Out of Food in the Last Year: Never true    Ran Out of Food in the Last Year: Never true  Transportation Needs: Unmet Transportation Needs (01/06/2021)   PRAPARE - Hydrologist (Medical): Yes    Lack of Transportation (Non-Medical): Yes  Physical Activity: Not on file  Stress: Not on file  Social Connections: Not on file  Intimate Partner Violence: Not on file    Outpatient Medications Prior to Visit  Medication Sig Dispense Refill   albuterol (PROVENTIL) (2.5 MG/3ML) 0.083% nebulizer solution Take 3 mLs (2.5 mg total) by nebulization every 6 (six) hours as needed for wheezing or shortness of breath. 120 mL 12   albuterol (VENTOLIN HFA) 108 (90 Base) MCG/ACT inhaler Inhale 2 puffs into the lungs every 6 (six) hours as needed for wheezing or shortness of breath. 8 g 3   allopurinol (ZYLOPRIM) 300 MG tablet TAKE ONE TABLET BY MOUTH EVERY DAY at 1pm 30 tablet 2   buprenorphine-naloxone (SUBOXONE) 8-2 mg SUBL SL tablet Place 1 tablet under the tongue daily. Taking 2.5 daukt     donepezil (ARICEPT) 10 MG tablet Take 1 tablet (10 mg total) by mouth at bedtime. 30 tablet 0   ferrous sulfate 324 MG TBEC Take 324 mg by mouth daily with breakfast. Take 1 every other day     hydrOXYzine (ATARAX) 50 MG tablet TAKE ONE TABLET BY MOUTH AT BEDTIME 30 tablet 2   memantine (NAMENDA XR) 28 MG CP24 24 hr capsule Take 28 mg by mouth at bedtime.      omeprazole (PRILOSEC) 40 MG capsule TAKE ONE CAPSULE BY MOUTH EVERY DAY 30 capsule 2   pregabalin (LYRICA) 50 MG capsule TAKE ONE CAPSULE BY MOUTH EVERY DAY and TAKE ONE CAPSULE BY MOUTH EVERY DAY at 1pm and TAKE ONE CAPSULE BY MOUTH AT BEDTIME 90 capsule 2   propranolol ER (INDERAL LA) 60 MG 24 hr capsule TAKE ONE CAPSULE BY MOUTH EVERY DAY 30 capsule 2   ramelteon (ROZEREM) 8 MG tablet Take 1 tablet (8 mg total) by mouth at bedtime. 30 tablet 11   rosuvastatin (CRESTOR) 5 MG tablet Take 1 tablet (5 mg total) by mouth at bedtime. 90 tablet 0   sertraline (ZOLOFT) 100 MG tablet TAKE ONE TABLET  BY MOUTH AT BEDTIME 30 tablet 2   tamsulosin (FLOMAX) 0.4 MG CAPS capsule TAKE 2 CAPSULES BY MOUTH EVERY EVENING 60 capsule 3   Tiotropium Bromide-Olodaterol (STIOLTO RESPIMAT) 2.5-2.5 MCG/ACT AERS Inhale 2 puffs into the lungs daily. 4 g 6   Triamcinolone Acetonide (TRIAMCINOLONE 0.1 % CREAM : EUCERIN) CREA Apply 1 application topically 2 (two) times daily. FOR LEGS. 1 each 3   triamcinolone cream (KENALOG) 0.1 % Apply 1 application. topically 2 (two) times daily as needed (to ears for itching). 30 g 2   Vitamin D, Ergocalciferol, (DRISDOL) 1.25 MG (50000 UNIT) CAPS capsule TAKE ONE CAPSULE BY MOUTH EVERY WEEK 5 capsule 2   furosemide (LASIX) 20 MG tablet Take 1 tablet (20 mg total) by mouth daily as needed. 30 tablet 3   QUEtiapine (SEROQUEL) 50 MG tablet TAKE ONE TABLET BY MOUTH AT BEDTIME 30 tablet 1   diclofenac Sodium (VOLTAREN) 1 % GEL Apply 2 g topically 4 (four) times daily as needed. 50 g 0   Potassium 99 MG TABS Take 297 mg by mouth daily as needed.      No facility-administered medications prior to visit.    Allergies  Allergen Reactions   Avelox [Moxifloxacin Hcl In Nacl] Swelling   Codeine Phosphate Itching   Cozaar     Unknown    Cymbalta [Duloxetine Hcl]     Twitching   Ropinirole     Weakness, dizziness, felt sick   Statins     myalgia   Welchol [Colesevelam Hcl]    Penicillins  Rash    Did it involve swelling of the face/tongue/throat, SOB, or low BP? No Did it involve sudden or severe rash/hives, skin peeling, or any reaction on the inside of your mouth or nose? Yes Did you need to seek medical attention at a hospital or doctor's office? Yes When did it last happen?      15 + years If all above answers are "NO", may proceed with cephalosporin use.     Review of Systems  Constitutional:  Positive for fatigue. Negative for appetite change and fever.  HENT:  Negative for congestion, ear pain, sinus pressure and sore throat.   Eyes:  Negative for pain.  Respiratory:  Positive for shortness of breath. Negative for cough and wheezing.   Cardiovascular:  Positive for leg swelling (lower legs red and warm to touch). Negative for chest pain and palpitations.  Gastrointestinal:  Positive for constipation (Taking stool softeners). Negative for abdominal pain, diarrhea, nausea and vomiting.  Genitourinary:  Negative for dysuria and frequency.  Musculoskeletal:  Positive for arthralgias and back pain. Negative for joint swelling and myalgias.  Skin:  Negative for rash.  Neurological:  Negative for dizziness, weakness and headaches.  Psychiatric/Behavioral:  Positive for hallucinations and sleep disturbance. Negative for dysphoric mood. The patient is not nervous/anxious.        Objective:    Physical Exam Vitals reviewed.  Constitutional:      Appearance: Normal appearance.  Neck:     Vascular: No carotid bruit.  Cardiovascular:     Rate and Rhythm: Normal rate and regular rhythm.     Heart sounds: Normal heart sounds.  Pulmonary:     Effort: Pulmonary effort is normal.     Breath sounds: Normal breath sounds. No wheezing, rhonchi or rales.  Abdominal:     General: Bowel sounds are normal.     Palpations: Abdomen is soft.     Tenderness: There is no abdominal tenderness.  Musculoskeletal:     Right lower leg: Edema present.     Left lower leg: Edema present.   Skin:    Findings: Rash (Stasis dermatitis bilateral leg.  Right leg blistering and weeping.) present.  Neurological:     Mental Status: He is alert and oriented to person, place, and time.  Psychiatric:        Mood and Affect: Mood normal.        Behavior: Behavior normal.     BP 130/70   Pulse 78   Temp (!) 96.2 F (35.7 C)   Resp 14   Ht _0  (1.803 m)   Wt 271 lb (122.9 kg)   BMI 37.80 kg/m  Wt Readings from Last 3 Encounters:  05/01/22 271 lb (122.9 kg)  02/16/22 262 lb (118.8 kg)  11/16/21 249 lb (112.9 kg)    Health Maintenance Due  Topic Date Due   COVID-19 Vaccine (1) Never done   URINE MICROALBUMIN  Never done   Hepatitis C Screening  Never done   TETANUS/TDAP  Never done   Zoster Vaccines- Shingrix (1 of 2) Never done   COLONOSCOPY (Pts 45-31yr Insurance coverage will need to be confirmed)  09/16/2015    There are no preventive care reminders to display for this patient.   Lab Results  Component Value Date   TSH 4.900 (H) 02/16/2022   Lab Results  Component Value Date   WBC 11.0 (H) 02/16/2022   HGB 12.2 (L) 02/16/2022   HCT 37.8 02/16/2022   MCV 83 02/16/2022   PLT 246 02/16/2022   Lab Results  Component Value Date   NA 145 (H) 02/16/2022   K 4.8 02/16/2022   CO2 26 02/16/2022   GLUCOSE 85 02/16/2022   BUN 23 02/16/2022   CREATININE 0.81 02/16/2022   BILITOT <0.2 02/16/2022   ALKPHOS 73 02/16/2022   AST 16 02/16/2022   ALT 13 02/16/2022   PROT 6.0 02/16/2022   ALBUMIN 4.1 02/16/2022   CALCIUM 9.1 02/16/2022   ANIONGAP 11 09/01/2020   EGFR 94 02/16/2022   Lab Results  Component Value Date   CHOL 129 02/16/2022   Lab Results  Component Value Date   HDL 45 02/16/2022   Lab Results  Component Value Date   LDLCALC 57 02/16/2022   Lab Results  Component Value Date   TRIG 162 (H) 02/16/2022   Lab Results  Component Value Date   CHOLHDL 2.9 02/16/2022   Lab Results  Component Value Date   HGBA1C 5.4 02/16/2022        Assessment & Plan:   Problem List Items Addressed This Visit       Digestive   Chronic idiopathic constipation    Continue stool softeners.        Nervous and Auditory   Vascular dementia with behavioral disturbance ([facility]    Order MRI of brain.      Relevant Medications   QUEtiapine (SEROQUEL) 100 MG tablet     Other   Uncomplicated opioid dependence (HCC)    Chronic pain.  Suboxone clinic      Memory loss - Primary    Check labs. Order MRI of brain.      Relevant Orders   CBC with Differential/Platelet   B12 and Folate Panel   Methylmalonic acid, serum   Comprehensive metabolic panel   TSH   VITAMIN D 25 Hydroxy (Vit-D Deficiency, Fractures)   MR Brain Wo Contrast   POCT URINALYSIS DIP (CLINITEK) (Completed)  Auditory hallucinations    Increase Seroquel 100 mg nightly.      Visual hallucinations    Increase Seroquel to 100 mg nightly.      Meds ordered this encounter  Medications   QUEtiapine (SEROQUEL) 100 MG tablet    Sig: Take 1 tablet (100 mg total) by mouth at bedtime.    Dispense:  30 tablet    Refill:  2   furosemide (LASIX) 40 MG tablet    Sig: Take 1 tablet (40 mg total) by mouth 2 (two) times daily.    Dispense:  60 tablet    Refill:  1    Orders Placed This Encounter  Procedures   MR Brain Wo Contrast   CBC with Differential/Platelet   B12 and Folate Panel   Methylmalonic acid, serum   Comprehensive metabolic panel   TSH   VITAMIN D 25 Hydroxy (Vit-D Deficiency, Fractures)   POCT URINALYSIS DIP (CLINITEK)     Follow-up: Return in about 24 days (around 05/25/2022) for chronic fasting.  An After Visit Summary was printed and given to the patient.  Rochel Brome, MD [facility] [phone]

## 2022-05-01 NOTE — Assessment & Plan Note (Signed)
Increase Seroquel to 100 mg nightly.

## 2022-05-01 NOTE — Patient Instructions (Addendum)
Increase furosemide 40 mg one twice a day in am and at lunch.   Increase quetiapine 100 mg once at night  Labs ordered  Brain MRI ordered.

## 2022-05-01 NOTE — Assessment & Plan Note (Signed)
Continue stool softeners.

## 2022-05-01 NOTE — Assessment & Plan Note (Signed)
Check labs. Order MRI of brain.

## 2022-05-02 ENCOUNTER — Telehealth: Payer: Self-pay | Admitting: Family Medicine

## 2022-05-02 NOTE — Telephone Encounter (Signed)
   Shawn Osborn has been scheduled for the following appointment:  WHAT: BRAIN MRI WHERE: [facility] DATE: 05/07/22 TIME: 11:45 AM CHECK-IN  A message has been left for the patient. LEFT MESSAGE ON DAUGHTER'S PHONE

## 2022-05-03 LAB — CBC WITH DIFFERENTIAL/PLATELET
Basophils Absolute: 0.1 10*3/uL (ref 0.0–0.2)
Basos: 1 %
EOS (ABSOLUTE): 0.6 10*3/uL — ABNORMAL HIGH (ref 0.0–0.4)
Eos: 5 %
Hematocrit: 40.7 % (ref 37.5–51.0)
Hemoglobin: 13.2 g/dL (ref 13.0–17.7)
Immature Grans (Abs): 0.1 10*3/uL (ref 0.0–0.1)
Immature Granulocytes: 1 %
Lymphocytes Absolute: 3.5 10*3/uL — ABNORMAL HIGH (ref 0.7–3.1)
Lymphs: 30 %
MCH: 27.8 pg (ref 26.6–33.0)
MCHC: 32.4 g/dL (ref 31.5–35.7)
MCV: 86 fL (ref 79–97)
Monocytes Absolute: 1 10*3/uL — ABNORMAL HIGH (ref 0.1–0.9)
Monocytes: 8 %
Neutrophils Absolute: 6.6 10*3/uL (ref 1.4–7.0)
Neutrophils: 55 %
Platelets: 227 10*3/uL (ref 150–450)
RBC: 4.74 x10E6/uL (ref 4.14–5.80)
RDW: 16.2 % — ABNORMAL HIGH (ref 11.6–15.4)
WBC: 11.8 10*3/uL — ABNORMAL HIGH (ref 3.4–10.8)

## 2022-05-03 LAB — COMPREHENSIVE METABOLIC PANEL
ALT: 18 IU/L (ref 0–44)
AST: 19 IU/L (ref 0–40)
Albumin/Globulin Ratio: 1.7 (ref 1.2–2.2)
Albumin: 4.3 g/dL (ref 3.7–4.7)
Alkaline Phosphatase: 87 IU/L (ref 44–121)
BUN/Creatinine Ratio: 36 — ABNORMAL HIGH (ref 10–24)
BUN: 28 mg/dL — ABNORMAL HIGH (ref 8–27)
Bilirubin Total: <0.2 mg/dL (ref 0.0–1.2)
CO2: 30 mmol/L — ABNORMAL HIGH (ref 20–29)
Calcium: 9.3 mg/dL (ref 8.6–10.2)
Chloride: 99 mmol/L (ref 96–106)
Creatinine, Ser: 0.77 mg/dL (ref 0.76–1.27)
Globulin, Total: 2.6 g/dL (ref 1.5–4.5)
Glucose: 92 mg/dL (ref 70–99)
Potassium: 5.5 mmol/L — ABNORMAL HIGH (ref 3.5–5.2)
Sodium: 143 mmol/L (ref 134–144)
Total Protein: 6.9 g/dL (ref 6.0–8.5)
eGFR: 96 mL/min/{1.73_m2} (ref >59)

## 2022-05-03 LAB — B12 AND FOLATE PANEL
Folate: 9.2 ng/mL (ref >3.0)
Vitamin B-12: 490 pg/mL (ref 232–1245)

## 2022-05-03 LAB — VITAMIN D 25 HYDROXY (VIT D DEFICIENCY, FRACTURES): Vit D, 25-Hydroxy: 36.9 ng/mL (ref 30.0–100.0)

## 2022-05-03 LAB — METHYLMALONIC ACID, SERUM: Methylmalonic Acid: 385 nmol/L — ABNORMAL HIGH (ref 0–378)

## 2022-05-03 LAB — TSH: TSH: 3.2 u[IU]/mL (ref 0.450–4.500)

## 2022-05-04 NOTE — Progress Notes (Signed)
Blood count normal.  Liver function normal.  Kidney function normal.  Potassium is elevated.  Check if patient is taking any potassium and if he is let me know. Thyroid function normal.  HBA1C: Mild B12 deficiency.  I recommend B12 over-the-counter 1000 mcg daily.  Call prevos  to have it put in his pill pack please

## 2022-05-07 DIAGNOSIS — G319 Degenerative disease of nervous system, unspecified: Secondary | ICD-10-CM | POA: Diagnosis not present

## 2022-05-07 DIAGNOSIS — R4182 Altered mental status, unspecified: Secondary | ICD-10-CM | POA: Diagnosis not present

## 2022-05-14 ENCOUNTER — Other Ambulatory Visit: Payer: Self-pay

## 2022-05-14 DIAGNOSIS — R413 Other amnesia: Secondary | ICD-10-CM

## 2022-05-17 ENCOUNTER — Other Ambulatory Visit: Payer: Self-pay | Admitting: Family Medicine

## 2022-05-18 ENCOUNTER — Other Ambulatory Visit: Payer: Self-pay

## 2022-05-18 MED ORDER — DONEPEZIL HCL 10 MG PO TABS: 10.0000 mg | ORAL_TABLET | Freq: Every day | ORAL | 1 refills | Status: DC

## 2022-05-19 DIAGNOSIS — F1190 Opioid use, unspecified, uncomplicated: Secondary | ICD-10-CM | POA: Diagnosis not present

## 2022-05-19 DIAGNOSIS — G8929 Other chronic pain: Secondary | ICD-10-CM | POA: Diagnosis not present

## 2022-05-19 DIAGNOSIS — Z79899 Other long term (current) drug therapy: Secondary | ICD-10-CM | POA: Diagnosis not present

## 2022-05-19 DIAGNOSIS — M25561 Pain in right knee: Secondary | ICD-10-CM | POA: Diagnosis not present

## 2022-05-19 DIAGNOSIS — M545 Low back pain, unspecified: Secondary | ICD-10-CM | POA: Diagnosis not present

## 2022-05-23 NOTE — Progress Notes (Signed)
Subjective:  Patient ID: Shawn Osborn, male    DOB: 27-Jun-1951  Age: 71 y.o. MRN: [id number]  Chief Complaint  Patient presents with   Hyperlipidemia   Hypertension   HPI: Follow up on hallucinations. I increased seroquel to 100 mg before bed, but it made him too sleepy so had to cut back to 50 mg before bed. Since going back down, he has not had any more hallucinations.  Found to have B12 deficiency. Started on B12 1000 mcg once daily.   Patient seems to run out of his blister pack 3 days early. When he runs out, his tremor seems to improve. He is concerned it is due to stopping lyrica. His daughter checked and it should be right. Does not happen every month.    Current Outpatient Medications on File Prior to Visit  Medication Sig Dispense Refill   albuterol (PROVENTIL) (2.5 MG/3ML) 0.083% nebulizer solution Take 3 mLs (2.5 mg total) by nebulization every 6 (six) hours as needed for wheezing or shortness of breath. 120 mL 12   albuterol (VENTOLIN HFA) 108 (90 Base) MCG/ACT inhaler Inhale 2 puffs into the lungs every 6 (six) hours as needed for wheezing or shortness of breath. 8 g 3   allopurinol (ZYLOPRIM) 300 MG tablet TAKE ONE TABLET BY MOUTH EVERY DAY at 1pm 30 tablet 2   buprenorphine-naloxone (SUBOXONE) 8-2 mg SUBL SL tablet Place 1 tablet under the tongue daily. Taking 2.5 daukt     donepezil (ARICEPT) 10 MG tablet Take 1 tablet (10 mg total) by mouth daily. 90 tablet 1   ferrous sulfate 324 MG TBEC Take 324 mg by mouth daily with breakfast. Take 1 every other day     hydrOXYzine (ATARAX) 50 MG tablet TAKE ONE TABLET BY MOUTH AT BEDTIME 30 tablet 2   memantine (NAMENDA XR) 28 MG CP24 24 hr capsule Take 28 mg by mouth at bedtime.     omeprazole (PRILOSEC) 40 MG capsule TAKE ONE CAPSULE BY MOUTH EVERY DAY 30 capsule 2   pregabalin (LYRICA) 50 MG capsule TAKE ONE CAPSULE BY MOUTH EVERY DAY and TAKE ONE CAPSULE BY MOUTH EVERY DAY at 1pm and TAKE ONE CAPSULE BY MOUTH AT BEDTIME 90  capsule 2   propranolol ER (INDERAL LA) 60 MG 24 hr capsule TAKE ONE CAPSULE BY MOUTH EVERY DAY 30 capsule 2   ramelteon (ROZEREM) 8 MG tablet Take 1 tablet (8 mg total) by mouth at bedtime. 30 tablet 11   rosuvastatin (CRESTOR) 5 MG tablet Take 1 tablet (5 mg total) by mouth at bedtime. 90 tablet 0   sertraline (ZOLOFT) 100 MG tablet TAKE ONE TABLET BY MOUTH AT BEDTIME 30 tablet 2   tamsulosin (FLOMAX) 0.4 MG CAPS capsule TAKE 2 CAPSULES BY MOUTH EVERY EVENING 60 capsule 3   Tiotropium Bromide-Olodaterol (STIOLTO RESPIMAT) 2.5-2.5 MCG/ACT AERS Inhale 2 puffs into the lungs daily. 4 g 6   Triamcinolone Acetonide (TRIAMCINOLONE 0.1 % CREAM : EUCERIN) CREA Apply 1 application topically 2 (two) times daily. FOR LEGS. 1 each 3   triamcinolone cream (KENALOG) 0.1 % Apply 1 application. topically 2 (two) times daily as needed (to ears for itching). 30 g 2   Vitamin D, Ergocalciferol, (DRISDOL) 1.25 MG (50000 UNIT) CAPS capsule TAKE ONE CAPSULE BY MOUTH EVERY WEEK 5 capsule 2   No current facility-administered medications on file prior to visit.   Past Medical History:  Diagnosis Date   Acute combined systolic and diastolic CHF, NYHA class 3 ([place]) 10/08/2019  Acute delirium 10/08/2019   Acute exacerbation of CHF (congestive heart failure) ([place]) 10/08/2019   Acute gout of multiple sites 07/01/2016   Acute respiratory failure with hypoxia and hypercapnia (HCC) 10/08/2019   Allergy    Anxiety    Arthritis    Gout, osteoarthritis in Back and Knees   Cataract    CHF (congestive heart failure) ([place])    Chronic bronchitis    Community acquired pneumonia 10/08/2019   COPD (chronic obstructive pulmonary disease) ([place]) 10/08/2019   Dementia ([place]) 10/08/2019   Elevated troponin 10/08/2019   Emphysema of lung ([place])    Essential hypertension 07/03/2016   GERD (gastroesophageal reflux disease)    History of kidney stones    HLD (hyperlipidemia) 10/08/2019   Hypercapnic respiratory failure ([place])  10/08/2019   Hyperlipidemia    Hypertension    Hypertensive heart disease 10/08/2019   Morbid obesity due to excess calories ([place]) 10/08/2019   Narcotic overdose ([place]) 10/08/2019   Neuropathy    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   NSTEMI (non-ST elevated myocardial infarction) ([place]) 10/08/2019   Obstructive sleep apnea    Occlusion and stenosis of carotid artery without mention of cerebral infarction 12/13/2011   OSA on CPAP 10/08/2019   Pain in joint of left shoulder 01/12/2019   Paranoia (psychosis) ([place]) 07/01/2016   Poorly-controlled hypertension 10/08/2019   Prediabetes    Psychotic disorder with delusions ([place]) 07/01/2016   Respiratory failure with hypoxia and hypercapnia ([place]) 10/08/2019   Restless leg syndrome 07/03/2016   Rheumatoid arthritis(714.0)    RLS (restless legs syndrome)    Sepsis ([place]) 10/08/2019   Sleep apnea    Spinal stenosis    Syncope 04/03/2019   Tear of left rotator cuff 0/25/4270   Toxic metabolic encephalopathy 62/37/6283   UTI (urinary tract infection) 10/08/2019   Vascular dementia with behavioral disturbance ([place]) 07/06/2016   Past Surgical History:  Procedure Laterality Date   APPENDECTOMY     CARPAL TUNNEL RELEASE     EYE SURGERY     INTRAVASCULAR PRESSURE WIRE/FFR STUDY N/A 10/20/2019   Procedure: INTRAVASCULAR PRESSURE WIRE/FFR STUDY;  Surgeon: Belva Crome, MD;  Location: [facility];  Service: Cardiovascular;  Laterality: N/A;   LEFT HEART CATH AND CORONARY ANGIOGRAPHY N/A 10/20/2019   Procedure: LEFT HEART CATH AND CORONARY ANGIOGRAPHY;  Surgeon: Belva Crome, MD;  Location: [facility];  Service: Cardiovascular;  Laterality: N/A;   [place], 2003, 2012   Laminectomy X 3    Family History  Problem Relation Age of Onset   Heart disease Mother    Hypertension Mother    Social History   Socioeconomic History   Marital status: Widowed    Spouse name: Not on file   Number of children: 4   Years of  education: Not on file   Highest education level: Not on file  Occupational History   Not on file  Tobacco Use   Smoking status: Every Day    Packs/day: 0.75    Years: 53.00    Total pack years: 39.75    Types: Cigarettes   Smokeless tobacco: Current    Types: Chew  Vaping Use   Vaping Use: Never used  Substance and Sexual Activity   Alcohol use: No   Drug use: No   Sexual activity: Not Currently  Other Topics Concern   Not on file  Social History Narrative   Not on file   Social Determinants of Health  Financial Resource Strain: Not on file  Food Insecurity: No Food Insecurity (03/23/2020)   Hunger Vital Sign    Worried About Running Out of Food in the Last Year: Never true    Ran Out of Food in the Last Year: Never true  Transportation Needs: Unmet Transportation Needs (01/06/2021)   PRAPARE - Hydrologist (Medical): Yes    Lack of Transportation (Non-Medical): Yes  Physical Activity: Not on file  Stress: Not on file  Social Connections: Not on file    Review of Systems  Constitutional:  Positive for fatigue. Negative for appetite change and fever.  HENT:  Negative for congestion, ear pain, sinus pressure and sore throat.   Respiratory:  Positive for cough, shortness of breath and wheezing. Negative for chest tightness.   Cardiovascular:  Positive for leg swelling. Negative for chest pain and palpitations.  Gastrointestinal:  Negative for abdominal pain, constipation, diarrhea, nausea and vomiting.  Genitourinary:  Positive for difficulty urinating and frequency. Negative for dysuria and hematuria.  Musculoskeletal:  Negative for arthralgias, back pain, joint swelling and myalgias.  Skin:  Negative for rash.       Lower legs red and weeping.    Neurological:  Negative for dizziness, weakness and headaches.  Psychiatric/Behavioral:  Negative for dysphoric mood and sleep disturbance. The patient is not nervous/anxious.      Objective:   BP 94/60   Pulse 72   Temp (!) 96.3 F (35.7 C)   Resp 18   Ht '5\' 11"'$  (1.803 m)   Wt 272 lb (123.4 kg)   BMI 37.94 kg/m      05/25/2022   10:26 AM 05/01/2022    4:06 PM 02/16/2022   11:22 AM  BP/Weight  Systolic BP 94 993 570  Diastolic BP 60 70 64  Wt. (Lbs) 272 271 262  BMI 37.94 kg/m2 37.8 kg/m2 37.59 kg/m2    Physical Exam Vitals reviewed.  Constitutional:      Appearance: Normal appearance. He is normal weight.  Neck:     Vascular: No carotid bruit.  Cardiovascular:     Rate and Rhythm: Normal rate and regular rhythm.     Heart sounds: Normal heart sounds.  Pulmonary:     Effort: Pulmonary effort is normal.     Breath sounds: Normal breath sounds.  Abdominal:     General: Abdomen is flat. Bowel sounds are normal.     Palpations: Abdomen is soft.  Neurological:     Mental Status: He is alert and oriented to person, place, and time.     Comments: Tremor.  Psychiatric:        Mood and Affect: Mood normal.        Behavior: Behavior normal.     Diabetic Foot Exam - Simple   No data filed      Lab Results  Component Value Date   WBC 11.8 (H) 05/01/2022   HGB 13.2 05/01/2022   HCT 40.7 05/01/2022   PLT 227 05/01/2022   GLUCOSE 83 05/25/2022   CHOL 129 02/16/2022   TRIG 162 (H) 02/16/2022   HDL 45 02/16/2022   LDLCALC 57 02/16/2022   ALT 6 05/25/2022   AST 11 05/25/2022   NA 141 05/25/2022   K 5.0 05/25/2022   CL 99 05/25/2022   CREATININE 0.91 05/25/2022   BUN 18 05/25/2022   CO2 27 05/25/2022   TSH 3.200 05/01/2022   INR 0.9 08/30/2020   HGBA1C 5.4 02/16/2022  Assessment & Plan:   Problem List Items Addressed This Visit       Cardiovascular and Mediastinum   Arterial hypotension    Hold propranolol.         Nervous and Auditory   Alzheimer's dementia [facility])    The current medical regimen is fairly effective;  continue present plan and medications.       Relevant Medications   QUEtiapine (SEROQUEL) 50 MG tablet   primidone  (MYSOLINE) 50 MG tablet     Other   Visual hallucinations    Resolved.      Hyperkalemia - Primary    Check cmp.       Relevant Orders   Comprehensive metabolic panel (Completed)   Tremor    Hold propranolol.  Start on primidone 50 mg four times a day.      . Total time spent on today's visit was greater than 30 minutes, including both face-to-face time and nonface-to-face time personally spent on review of chart (labs and imaging), discussing labs and goals, discussing further work-up, treatment options, referrals to specialist if needed, reviewing outside records of pertinent, answering patient's questions, and coordinating care.     Follow-up: Return in about 1 month (around 06/25/2022) for chronic follow up.  An After Visit Summary was printed and given to the patient.  Rochel Brome, MD [facility] [phone]

## 2022-05-25 ENCOUNTER — Ambulatory Visit (INDEPENDENT_AMBULATORY_CARE_PROVIDER_SITE_OTHER): Payer: Medicare Other | Admitting: Family Medicine

## 2022-05-25 VITALS — BP 94/60 | HR 72 | Temp 96.3°F | Resp 18 | Ht 71.0 in | Wt 272.0 lb

## 2022-05-25 DIAGNOSIS — K219 Gastro-esophageal reflux disease without esophagitis: Secondary | ICD-10-CM

## 2022-05-25 DIAGNOSIS — E875 Hyperkalemia: Secondary | ICD-10-CM

## 2022-05-25 DIAGNOSIS — E782 Mixed hyperlipidemia: Secondary | ICD-10-CM

## 2022-05-25 DIAGNOSIS — R7303 Prediabetes: Secondary | ICD-10-CM

## 2022-05-25 DIAGNOSIS — G300 Alzheimer's disease with early onset: Secondary | ICD-10-CM | POA: Diagnosis not present

## 2022-05-25 DIAGNOSIS — Z9989 Dependence on other enabling machines and devices: Secondary | ICD-10-CM

## 2022-05-25 DIAGNOSIS — J418 Mixed simple and mucopurulent chronic bronchitis: Secondary | ICD-10-CM

## 2022-05-25 DIAGNOSIS — R441 Visual hallucinations: Secondary | ICD-10-CM | POA: Diagnosis not present

## 2022-05-25 DIAGNOSIS — R251 Tremor, unspecified: Secondary | ICD-10-CM | POA: Diagnosis not present

## 2022-05-25 DIAGNOSIS — I5042 Chronic combined systolic (congestive) and diastolic (congestive) heart failure: Secondary | ICD-10-CM

## 2022-05-25 DIAGNOSIS — I9589 Other hypotension: Secondary | ICD-10-CM

## 2022-05-25 DIAGNOSIS — G894 Chronic pain syndrome: Secondary | ICD-10-CM

## 2022-05-25 DIAGNOSIS — F02B2 Dementia in other diseases classified elsewhere, moderate, with psychotic disturbance: Secondary | ICD-10-CM

## 2022-05-25 DIAGNOSIS — I25118 Atherosclerotic heart disease of native coronary artery with other forms of angina pectoris: Secondary | ICD-10-CM

## 2022-05-25 DIAGNOSIS — I110 Hypertensive heart disease with heart failure: Secondary | ICD-10-CM

## 2022-05-25 DIAGNOSIS — F1120 Opioid dependence, uncomplicated: Secondary | ICD-10-CM

## 2022-05-25 DIAGNOSIS — J9611 Chronic respiratory failure with hypoxia: Secondary | ICD-10-CM

## 2022-05-25 LAB — COMPREHENSIVE METABOLIC PANEL
ALT: 6 IU/L (ref 0–44)
AST: 11 IU/L (ref 0–40)
Albumin/Globulin Ratio: 1.8 (ref 1.2–2.2)
Albumin: 4.2 g/dL (ref 3.7–4.7)
Alkaline Phosphatase: 75 IU/L (ref 44–121)
BUN/Creatinine Ratio: 20 (ref 10–24)
BUN: 18 mg/dL (ref 8–27)
Bilirubin Total: <0.2 mg/dL (ref 0.0–1.2)
CO2: 27 mmol/L (ref 20–29)
Calcium: 9.1 mg/dL (ref 8.6–10.2)
Chloride: 99 mmol/L (ref 96–106)
Creatinine, Ser: 0.91 mg/dL (ref 0.76–1.27)
Globulin, Total: 2.4 g/dL (ref 1.5–4.5)
Glucose: 83 mg/dL (ref 70–99)
Potassium: 5 mmol/L (ref 3.5–5.2)
Sodium: 141 mmol/L (ref 134–144)
Total Protein: 6.6 g/dL (ref 6.0–8.5)
eGFR: 90 mL/min/{1.73_m2} (ref >59)

## 2022-05-26 DIAGNOSIS — J449 Chronic obstructive pulmonary disease, unspecified: Secondary | ICD-10-CM | POA: Diagnosis not present

## 2022-05-30 DIAGNOSIS — R251 Tremor, unspecified: Secondary | ICD-10-CM | POA: Insufficient documentation

## 2022-05-30 DIAGNOSIS — I959 Hypotension, unspecified: Secondary | ICD-10-CM | POA: Insufficient documentation

## 2022-05-30 DIAGNOSIS — E875 Hyperkalemia: Secondary | ICD-10-CM | POA: Insufficient documentation

## 2022-05-30 MED ORDER — QUETIAPINE FUMARATE 50 MG PO TABS: 50.0000 mg | ORAL_TABLET | Freq: Every day | ORAL | 2 refills | Status: DC

## 2022-06-01 ENCOUNTER — Other Ambulatory Visit: Payer: Self-pay | Admitting: Family Medicine

## 2022-06-03 ENCOUNTER — Encounter: Payer: Self-pay | Admitting: Family Medicine

## 2022-06-03 MED ORDER — PRIMIDONE 50 MG PO TABS: 50.0000 mg | ORAL_TABLET | Freq: Four times a day (QID) | ORAL | 2 refills | Status: DC

## 2022-06-03 NOTE — Assessment & Plan Note (Signed)
Check cmp 

## 2022-06-03 NOTE — Assessment & Plan Note (Signed)
Hold propranolol.  Start on primidone 50 mg four times a day.

## 2022-06-03 NOTE — Assessment & Plan Note (Signed)
Hold propranolol.

## 2022-06-03 NOTE — Assessment & Plan Note (Signed)
Resolved

## 2022-06-03 NOTE — Assessment & Plan Note (Signed)
The current medical regimen is fairly effective;  continue present plan and medications.  

## 2022-06-04 ENCOUNTER — Telehealth: Payer: Self-pay

## 2022-06-04 NOTE — Telephone Encounter (Signed)
Attempted to call patient, no answer, Left VM for return call.  Shawn Osborn, [place] 06/04/22 8:55 AM

## 2022-06-04 NOTE — Telephone Encounter (Signed)
-----   Message from Rochel Brome, MD sent at 06/03/2022  6:26 PM EDT ----- Regarding: stop propranolol and start primidone. Stop propranolol and start primidone 50 mg four times a day.  Dr Tobie Poet

## 2022-06-05 ENCOUNTER — Telehealth: Payer: Self-pay

## 2022-06-05 NOTE — Telephone Encounter (Signed)
Shawn Osborn was notified of medication changes since we have been unable to reach her Dad.

## 2022-06-05 NOTE — Telephone Encounter (Signed)
-----   Message from Rochel Brome, MD sent at 05/30/2022  5:03 PM EDT ----- Regarding: tremor. Please call patient and recommend discontinue propranolol and start on primidone to see if it helps tremor without lowering bp. Please ask if patient can check bp daily. If he is unable to check a bp at home, I would like him to come in for a nurse visit in 1-2 weeks for a bp check. Let me know if patient agrees and I will send primidone.  Dr Tobie Poet

## 2022-06-12 ENCOUNTER — Ambulatory Visit (INDEPENDENT_AMBULATORY_CARE_PROVIDER_SITE_OTHER): Payer: Medicare Other | Admitting: Family Medicine

## 2022-06-12 VITALS — BP 120/68 | HR 60 | Temp 97.0°F | Resp 18 | Ht 71.0 in | Wt 270.0 lb

## 2022-06-12 DIAGNOSIS — R251 Tremor, unspecified: Secondary | ICD-10-CM | POA: Diagnosis not present

## 2022-06-12 DIAGNOSIS — L89311 Pressure ulcer of right buttock, stage 1: Secondary | ICD-10-CM | POA: Diagnosis not present

## 2022-06-12 DIAGNOSIS — L89312 Pressure ulcer of right buttock, stage 2: Secondary | ICD-10-CM | POA: Insufficient documentation

## 2022-06-12 DIAGNOSIS — I872 Venous insufficiency (chronic) (peripheral): Secondary | ICD-10-CM

## 2022-06-12 DIAGNOSIS — I952 Hypotension due to drugs: Secondary | ICD-10-CM | POA: Insufficient documentation

## 2022-06-12 MED ORDER — HYDROCORTISONE 2.5 % EX CREA: TOPICAL_CREAM | Freq: Two times a day (BID) | CUTANEOUS | 0 refills | Status: DC

## 2022-06-12 NOTE — Patient Instructions (Signed)
Recommend barrier cream such as Desitin or "BUTT" cream.  Continue propranolol at current dose. We will cancel primidone prescription at previous.

## 2022-06-12 NOTE — Progress Notes (Signed)
Subjective:  Patient ID: Shawn Osborn, male    DOB: 05/09/1951  Age: 71 y.o. MRN: [id number]  Chief Complaint  Patient presents with   Tremors    HPI I Had recommended stop propranolol and start on primidone. Patient did not do this due to concerns about the primidone causing low bp. My intent was to improve his bp and control his tremor by discontinuing propranolol and changing to primidone. As it stands his bp has imrpved and his tremor seems to be doing fairly well.  Follow up on hallucinations. He has not had any more hallucinations.   Current Outpatient Medications on File Prior to Visit  Medication Sig Dispense Refill   albuterol (PROVENTIL) (2.5 MG/3ML) 0.083% nebulizer solution Take 3 mLs (2.5 mg total) by nebulization every 6 (six) hours as needed for wheezing or shortness of breath. 120 mL 12   albuterol (VENTOLIN HFA) 108 (90 Base) MCG/ACT inhaler Inhale 2 puffs into the lungs every 6 (six) hours as needed for wheezing or shortness of breath. 8 g 3   allopurinol (ZYLOPRIM) 300 MG tablet TAKE ONE TABLET BY MOUTH EVERY DAY at 1pm (Patient not taking: Reported on 06/12/2022) 30 tablet 2   buprenorphine-naloxone (SUBOXONE) 8-2 mg SUBL SL tablet Place 1 tablet under the tongue daily. Taking 2.5 daukt     donepezil (ARICEPT) 10 MG tablet Take 1 tablet (10 mg total) by mouth daily. 90 tablet 1   ferrous sulfate 324 MG TBEC Take 324 mg by mouth daily with breakfast. Take 1 every other day     furosemide (LASIX) 40 MG tablet Take 1 tablet (40 mg total) by mouth 2 (two) times daily. 60 tablet 1   hydrOXYzine (ATARAX) 50 MG tablet TAKE ONE TABLET BY MOUTH AT BEDTIME 30 tablet 2   memantine (NAMENDA XR) 28 MG CP24 24 hr capsule Take 28 mg by mouth at bedtime.     omeprazole (PRILOSEC) 40 MG capsule TAKE ONE CAPSULE BY MOUTH EVERY DAY 30 capsule 2   pregabalin (LYRICA) 50 MG capsule TAKE ONE CAPSULE BY MOUTH EVERY DAY and TAKE ONE CAPSULE BY MOUTH EVERY DAY at 1pm and TAKE ONE CAPSULE BY  MOUTH AT BEDTIME 90 capsule 2   QUEtiapine (SEROQUEL) 50 MG tablet Take 1 tablet (50 mg total) by mouth at bedtime. 30 tablet 2   ramelteon (ROZEREM) 8 MG tablet Take 1 tablet (8 mg total) by mouth at bedtime. 30 tablet 11   rosuvastatin (CRESTOR) 5 MG tablet Take 1 tablet (5 mg total) by mouth at bedtime. 90 tablet 0   sertraline (ZOLOFT) 100 MG tablet TAKE ONE TABLET BY MOUTH AT BEDTIME 30 tablet 2   tamsulosin (FLOMAX) 0.4 MG CAPS capsule TAKE 2 CAPSULES BY MOUTH EVERY EVENING 60 capsule 3   Tiotropium Bromide-Olodaterol (STIOLTO RESPIMAT) 2.5-2.5 MCG/ACT AERS Inhale 2 puffs into the lungs daily. 4 g 6   triamcinolone cream (KENALOG) 0.1 % Apply 1 application. topically 2 (two) times daily as needed (to ears for itching). 30 g 2   Vitamin D, Ergocalciferol, (DRISDOL) 1.25 MG (50000 UNIT) CAPS capsule TAKE ONE CAPSULE BY MOUTH EVERY WEEK 5 capsule 2   No current facility-administered medications on file prior to visit.   Past Medical History:  Diagnosis Date   Acute combined systolic and diastolic CHF, NYHA class 3 ([place]) 10/08/2019   Acute delirium 10/08/2019   Acute exacerbation of CHF (congestive heart failure) ([place]) 10/08/2019   Acute gout of multiple sites 07/01/2016   Acute respiratory  failure with hypoxia and hypercapnia (HCC) 10/08/2019   Allergy    Anxiety    Arthritis    Gout, osteoarthritis in Back and Knees   Cataract    CHF (congestive heart failure) (HCC)    Chronic bronchitis    Community acquired pneumonia 10/08/2019   COPD (chronic obstructive pulmonary disease) ([place]) 10/08/2019   Dementia ([place]) 10/08/2019   Elevated troponin 10/08/2019   Emphysema of lung ([place])    Essential hypertension 07/03/2016   GERD (gastroesophageal reflux disease)    History of kidney stones    HLD (hyperlipidemia) 10/08/2019   Hypercapnic respiratory failure ([place]) 10/08/2019   Hyperlipidemia    Hypertension    Hypertensive heart disease 10/08/2019   Morbid obesity due to excess calories  ([place]) 10/08/2019   Narcotic overdose ([place]) 10/08/2019   Neuropathy    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   NSTEMI (non-ST elevated myocardial infarction) ([place]) 10/08/2019   Obstructive sleep apnea    Occlusion and stenosis of carotid artery without mention of cerebral infarction 12/13/2011   OSA on CPAP 10/08/2019   Pain in joint of left shoulder 01/12/2019   Paranoia (psychosis) ([place]) 07/01/2016   Poorly-controlled hypertension 10/08/2019   Prediabetes    Psychotic disorder with delusions ([place]) 07/01/2016   Respiratory failure with hypoxia and hypercapnia ([place]) 10/08/2019   Restless leg syndrome 07/03/2016   Rheumatoid arthritis(714.0)    RLS (restless legs syndrome)    Sepsis ([place]) 10/08/2019   Sleep apnea    Spinal stenosis    Syncope 04/03/2019   Tear of left rotator cuff 0/98/1191   Toxic metabolic encephalopathy 47/82/9562   UTI (urinary tract infection) 10/08/2019   Vascular dementia with behavioral disturbance ([place]) 07/06/2016   Past Surgical History:  Procedure Laterality Date   APPENDECTOMY     CARPAL TUNNEL RELEASE     EYE SURGERY     INTRAVASCULAR PRESSURE WIRE/FFR STUDY N/A 10/20/2019   Procedure: INTRAVASCULAR PRESSURE WIRE/FFR STUDY;  Surgeon: Belva Crome, MD;  Location: [facility];  Service: Cardiovascular;  Laterality: N/A;   LEFT HEART CATH AND CORONARY ANGIOGRAPHY N/A 10/20/2019   Procedure: LEFT HEART CATH AND CORONARY ANGIOGRAPHY;  Surgeon: Belva Crome, MD;  Location: [facility];  Service: Cardiovascular;  Laterality: N/A;   [place], 2003, 2012   Laminectomy X 3    Family History  Problem Relation Age of Onset   Heart disease Mother    Hypertension Mother    Social History   Socioeconomic History   Marital status: Widowed    Spouse name: Not on file   Number of children: 4   Years of education: Not on file   Highest education level: Not on file  Occupational History   Not on file  Tobacco Use   Smoking  status: Every Day    Packs/day: 0.75    Years: 53.00    Total pack years: 39.75    Types: Cigarettes   Smokeless tobacco: Current    Types: Chew  Vaping Use   Vaping Use: Never used  Substance and Sexual Activity   Alcohol use: No   Drug use: No   Sexual activity: Not Currently  Other Topics Concern   Not on file  Social History Narrative   Not on file   Social Determinants of Health   Financial Resource Strain: Not on file  Food Insecurity: No Food Insecurity (03/23/2020)   Hunger Vital Sign    Worried About Running  Out of Food in the Last Year: Never true    Ran Out of Food in the Last Year: Never true  Transportation Needs: Unmet Transportation Needs (01/06/2021)   PRAPARE - Hydrologist (Medical): Yes    Lack of Transportation (Non-Medical): Yes  Physical Activity: Not on file  Stress: Not on file  Social Connections: Not on file    Review of Systems  Constitutional:  Negative for chills and fever.  HENT:  Negative for congestion, rhinorrhea and sore throat.   Respiratory:  Positive for cough and shortness of breath.   Cardiovascular:  Positive for leg swelling. Negative for chest pain and palpitations.  Gastrointestinal:  Negative for abdominal pain, constipation, diarrhea, nausea and vomiting.  Genitourinary:  Negative for dysuria and urgency.  Musculoskeletal:  Negative for arthralgias, back pain and myalgias.  Skin:        Redness and itching lower legs with some weeping.  Neurological:  Negative for dizziness and headaches.  Psychiatric/Behavioral:  Negative for dysphoric mood. The patient is not nervous/anxious.      Objective:  BP 120/68   Pulse 60   Temp (!) 97 F (36.1 C)   Resp 18   Ht '5\' 11"'$  (1.803 m)   Wt 270 lb (122.5 kg)   BMI 37.66 kg/m      06/12/2022    9:44 AM 05/25/2022   10:26 AM 05/01/2022    4:06 PM  BP/Weight  Systolic BP 409 94 811  Diastolic BP 68 60 70  Wt. (Lbs) 270 272 271  BMI 37.66 kg/m2 37.94  kg/m2 37.8 kg/m2    Physical Exam Vitals reviewed.  Constitutional:      Appearance: Normal appearance.  Cardiovascular:     Rate and Rhythm: Normal rate and regular rhythm.     Heart sounds: Normal heart sounds.  Pulmonary:     Effort: Pulmonary effort is normal.     Breath sounds: Normal breath sounds.  Skin:    Findings: Lesion (rt buttock well circumscribed shallow ulcer. rt side buttock: irregular shallow ulcertion) present.     Comments: Stasis dermatitis, weeping. Mild edema bl.  Neurological:     Mental Status: He is alert.     Diabetic Foot Exam - Simple   No data filed      Lab Results  Component Value Date   WBC 11.8 (H) 05/01/2022   HGB 13.2 05/01/2022   HCT 40.7 05/01/2022   PLT 227 05/01/2022   GLUCOSE 83 05/25/2022   CHOL 129 02/16/2022   TRIG 162 (H) 02/16/2022   HDL 45 02/16/2022   LDLCALC 57 02/16/2022   ALT 6 05/25/2022   AST 11 05/25/2022   NA 141 05/25/2022   K 5.0 05/25/2022   CL 99 05/25/2022   CREATININE 0.91 05/25/2022   BUN 18 05/25/2022   CO2 27 05/25/2022   TSH 3.200 05/01/2022   INR 0.9 08/30/2020   HGBA1C 5.4 02/16/2022      Assessment & Plan:   Problem List Items Addressed This Visit       Cardiovascular and Mediastinum   Hypotension due to drugs    Resolved.        Musculoskeletal and Integument   Venous stasis dermatitis of both lower extremities    Hydrocortisone-eucerin cream apply twice daily.       Relevant Medications   hydrocortisone 2.5%-Eucerin equivalent 1:1 cream mixture     Other   Tremor    Continue propranolol at current  dose. We will cancel primidone prescription at previous.      Pressure injury of right buttock, stage 2 (HCC) - Primary    Recommend barrier cream such as Desitin or "BUTT" cream.       Pressure injury of right buttock, stage 1    Recommend barrier cream such as Desitin or "BUTT" cream.      .  Meds ordered this encounter  Medications   hydrocortisone 2.5%-Eucerin  equivalent 1:1 cream mixture    Sig: Apply topically 2 (two) times daily.    Dispense:  120 g    Refill:  0      Follow-up: No follow-ups on file.  An After Visit Summary was printed and given to the patient.  Rochel Brome, MD [facility] [phone]

## 2022-06-19 ENCOUNTER — Encounter: Payer: Self-pay | Admitting: Family Medicine

## 2022-06-19 NOTE — Assessment & Plan Note (Signed)
Recommend barrier cream such as Desitin or "BUTT" cream.

## 2022-06-19 NOTE — Assessment & Plan Note (Signed)
Resolved

## 2022-06-19 NOTE — Assessment & Plan Note (Signed)
Continue propranolol at current dose. We will cancel primidone prescription at previous.

## 2022-06-19 NOTE — Assessment & Plan Note (Signed)
Hydrocortisone-eucerin cream apply twice daily.

## 2022-06-26 ENCOUNTER — Other Ambulatory Visit: Payer: Self-pay | Admitting: Family Medicine

## 2022-06-26 DIAGNOSIS — M1A09X0 Idiopathic chronic gout, multiple sites, without tophus (tophi): Secondary | ICD-10-CM

## 2022-06-29 ENCOUNTER — Other Ambulatory Visit: Payer: Self-pay | Admitting: Family Medicine

## 2022-07-01 ENCOUNTER — Other Ambulatory Visit: Payer: Self-pay | Admitting: Family Medicine

## 2022-07-02 ENCOUNTER — Ambulatory Visit: Payer: Medicare Other | Admitting: Family Medicine

## 2022-07-03 ENCOUNTER — Encounter: Payer: Self-pay | Admitting: Family Medicine

## 2022-07-03 ENCOUNTER — Ambulatory Visit (INDEPENDENT_AMBULATORY_CARE_PROVIDER_SITE_OTHER): Payer: Medicare Other | Admitting: Family Medicine

## 2022-07-03 VITALS — BP 104/58 | HR 61 | Resp 18 | Ht 71.0 in | Wt 278.0 lb

## 2022-07-03 DIAGNOSIS — L97922 Non-pressure chronic ulcer of unspecified part of left lower leg with fat layer exposed: Secondary | ICD-10-CM | POA: Insufficient documentation

## 2022-07-03 DIAGNOSIS — I5042 Chronic combined systolic (congestive) and diastolic (congestive) heart failure: Secondary | ICD-10-CM

## 2022-07-03 DIAGNOSIS — I872 Venous insufficiency (chronic) (peripheral): Secondary | ICD-10-CM

## 2022-07-03 DIAGNOSIS — F1120 Opioid dependence, uncomplicated: Secondary | ICD-10-CM

## 2022-07-03 DIAGNOSIS — L89312 Pressure ulcer of right buttock, stage 2: Secondary | ICD-10-CM

## 2022-07-03 DIAGNOSIS — E782 Mixed hyperlipidemia: Secondary | ICD-10-CM

## 2022-07-03 DIAGNOSIS — E538 Deficiency of other specified B group vitamins: Secondary | ICD-10-CM | POA: Insufficient documentation

## 2022-07-03 DIAGNOSIS — E875 Hyperkalemia: Secondary | ICD-10-CM

## 2022-07-03 DIAGNOSIS — R7303 Prediabetes: Secondary | ICD-10-CM | POA: Diagnosis not present

## 2022-07-03 DIAGNOSIS — G300 Alzheimer's disease with early onset: Secondary | ICD-10-CM

## 2022-07-03 DIAGNOSIS — F02B3 Dementia in other diseases classified elsewhere, moderate, with mood disturbance: Secondary | ICD-10-CM

## 2022-07-03 DIAGNOSIS — R251 Tremor, unspecified: Secondary | ICD-10-CM

## 2022-07-03 DIAGNOSIS — R8290 Unspecified abnormal findings in urine: Secondary | ICD-10-CM | POA: Insufficient documentation

## 2022-07-03 LAB — POCT URINALYSIS DIP (CLINITEK)
Blood, UA: NEGATIVE
Glucose, UA: NEGATIVE mg/dL
Ketones, POC UA: NEGATIVE mg/dL
Leukocytes, UA: NEGATIVE
Nitrite, UA: NEGATIVE
POC PROTEIN,UA: NEGATIVE
Spec Grav, UA: 1.025 (ref 1.010–1.025)
Urobilinogen, UA: 0.2 E.U./dL
pH, UA: 5 (ref 5.0–8.0)

## 2022-07-03 NOTE — Assessment & Plan Note (Signed)
Refer to wound care center. 

## 2022-07-03 NOTE — Assessment & Plan Note (Signed)
Check b12/mma

## 2022-07-03 NOTE — Assessment & Plan Note (Signed)
The current medical regimen is effective;  continue present plan and medications.  

## 2022-07-03 NOTE — Patient Instructions (Signed)
Referring to wound care center at [facility].   Please clean feet daily.  Wear socks.  Please try to get new/clean shoes.   Please Call us back with the name of memory medicine which when you stopped it, the jerking/tremor improved.

## 2022-07-03 NOTE — Assessment & Plan Note (Signed)
Recommend continue to work on eating healthy diet and exercise.  

## 2022-07-03 NOTE — Progress Notes (Signed)
Subjective:  Patient ID: Shawn Osborn, male    DOB: February 24, 1951  Age: 71 y.o. MRN: [id number]  Chief Complaint  Patient presents with   Hyperlipidemia   HPI COPD: On stiolto 2 puffs daily, albuterol HFA 2 puffs four times a day as needed. Chronic Pain syndrome: Managed by pain clinic. ON suboxone, lyrica 50 mg three times a day, and allopurinol 300 mg daily. Patient has chronic back pain, gout, knee pain.  Alzheimer's Dementia: aricept  and namenda. Stopped one of these medicines because his tremor seemed worse on them. Believes he stopped namenda due to jerking but is unsure on medication name. Depression: zoloft 100 mg once daily. ON seroquel 50 mg before bed, rozerem 8 mg before bed, and hydroxyzine 50 mg before bed.  Hypertensive heart disease with CORONARY ARTERY DISEASE/CONGESTIVE HEART FAILURE. On propranolol ER 60 mg daily (also for tremor.), crestor 5 mg before bed.  Vitamin D deficiency: on Vitamin D 50K weekly.  GERD: well controlled on omeprazole 40 mg daily.  BPH: on tamsulosin 0.4 mg before bed.  Neuropathy: on lyrica 50 mg three times a day. MMA high consistent with mild b12 deficiency. Patient does not appear to be taking b12.    Current Outpatient Medications on File Prior to Visit  Medication Sig Dispense Refill   albuterol (PROVENTIL) (2.5 MG/3ML) 0.083% nebulizer solution Take 3 mLs (2.5 mg total) by nebulization every 6 (six) hours as needed for wheezing or shortness of breath. 120 mL 12   albuterol (VENTOLIN HFA) 108 (90 Base) MCG/ACT inhaler Inhale 2 puffs into the lungs every 6 (six) hours as needed for wheezing or shortness of breath. 8 g 3   allopurinol (ZYLOPRIM) 300 MG tablet TAKE ONE TABLET BY MOUTH EVERY DAY at 1pm 30 tablet 2   buprenorphine-naloxone (SUBOXONE) 8-2 mg SUBL SL tablet Place 1 tablet under the tongue daily. Taking 2.5 daukt     donepezil (ARICEPT) 10 MG tablet Take 1 tablet (10 mg total) by mouth daily. 90 tablet 1   ferrous sulfate 324 MG  TBEC Take 324 mg by mouth daily with breakfast. Take 1 every other day     furosemide (LASIX) 40 MG tablet Take 1 tablet (40 mg total) by mouth 2 (two) times daily. 60 tablet 1   hydrocortisone 2.5%-Eucerin equivalent 1:1 cream mixture Apply topically 2 (two) times daily. 120 g 0   hydrOXYzine (ATARAX) 50 MG tablet TAKE ONE TABLET BY MOUTH AT BEDTIME 30 tablet 2   memantine (NAMENDA XR) 28 MG CP24 24 hr capsule Take 28 mg by mouth at bedtime.     omeprazole (PRILOSEC) 40 MG capsule TAKE ONE CAPSULE BY MOUTH EVERY DAY 30 capsule 2   pregabalin (LYRICA) 50 MG capsule TAKE ONE CAPSULE BY MOUTH EVERY DAY and TAKE ONE CAPSULE BY MOUTH EVERY DAY at 1pm and TAKE ONE CAPSULE BY MOUTH AT BEDTIME 90 capsule 2   propranolol ER (INDERAL LA) 60 MG 24 hr capsule TAKE ONE CAPSULE BY MOUTH EVERY DAY 30 capsule 2   QUEtiapine (SEROQUEL) 50 MG tablet Take 1 tablet (50 mg total) by mouth at bedtime. 30 tablet 2   ramelteon (ROZEREM) 8 MG tablet Take 1 tablet (8 mg total) by mouth at bedtime. 30 tablet 11   rosuvastatin (CRESTOR) 5 MG tablet TAKE ONE TABLET BY MOUTH AT BEDTIME 30 tablet 2   sertraline (ZOLOFT) 100 MG tablet TAKE ONE TABLET BY MOUTH AT BEDTIME 30 tablet 2   tamsulosin (FLOMAX) 0.4 MG CAPS capsule  TAKE 2 CAPSULES BY MOUTH EVERY EVENING 60 capsule 3   triamcinolone cream (KENALOG) 0.1 % Apply 1 application. topically 2 (two) times daily as needed (to ears for itching). 30 g 2   Vitamin D, Ergocalciferol, (DRISDOL) 1.25 MG (50000 UNIT) CAPS capsule TAKE ONE CAPSULE BY MOUTH EVERY WEEK 5 capsule 2   No current facility-administered medications on file prior to visit.   Past Medical History:  Diagnosis Date   Acute combined systolic and diastolic CHF, NYHA class 3 (HCC) 10/08/2019   Acute delirium 10/08/2019   Acute exacerbation of CHF (congestive heart failure) ([place]) 10/08/2019   Acute gout of multiple sites 07/01/2016   Acute respiratory failure with hypoxia and hypercapnia (HCC) 10/08/2019    Allergy    Anxiety    Arthritis    Gout, osteoarthritis in Back and Knees   Auditory hallucinations 05/01/2022   Cataract    CHF (congestive heart failure) (HCC)    Chronic bronchitis    Community acquired pneumonia 10/08/2019   COPD (chronic obstructive pulmonary disease) ([place]) 10/08/2019   Dementia ([place]) 10/08/2019   Elevated troponin 10/08/2019   Emphysema of lung ([place])    Essential hypertension 07/03/2016   GERD (gastroesophageal reflux disease)    History of kidney stones    HLD (hyperlipidemia) 10/08/2019   Hypercapnic respiratory failure ([place]) 10/08/2019   Hyperlipidemia    Hypertension    Hypertensive heart disease 10/08/2019   Morbid obesity due to excess calories ([place]) 10/08/2019   Narcotic overdose ([place]) 10/08/2019   Neuropathy    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   NSTEMI (non-ST elevated myocardial infarction) ([place]) 10/08/2019   Obstructive sleep apnea    Occlusion and stenosis of carotid artery without mention of cerebral infarction 12/13/2011   OSA on CPAP 10/08/2019   Pain in joint of left shoulder 01/12/2019   Paranoia (psychosis) ([place]) 07/01/2016   Poorly-controlled hypertension 10/08/2019   Prediabetes    Psychotic disorder with delusions ([place]) 07/01/2016   Respiratory failure with hypoxia and hypercapnia ([place]) 10/08/2019   Restless leg syndrome 07/03/2016   Rheumatoid arthritis(714.0)    RLS (restless legs syndrome)    Sepsis ([place]) 10/08/2019   Sleep apnea    Spinal stenosis    Syncope 04/03/2019   Tear of left rotator cuff 2/77/8242   Toxic metabolic encephalopathy 35/36/1443   UTI (urinary tract infection) 10/08/2019   Vascular dementia with behavioral disturbance ([place]) 07/06/2016   Visual hallucinations 05/01/2022   Past Surgical History:  Procedure Laterality Date   APPENDECTOMY     CARPAL TUNNEL RELEASE     EYE SURGERY     INTRAVASCULAR PRESSURE WIRE/FFR STUDY N/A 10/20/2019   Procedure: INTRAVASCULAR PRESSURE WIRE/FFR STUDY;  Surgeon:  Belva Crome, MD;  Location: [facility];  Service: Cardiovascular;  Laterality: N/A;   LEFT HEART CATH AND CORONARY ANGIOGRAPHY N/A 10/20/2019   Procedure: LEFT HEART CATH AND CORONARY ANGIOGRAPHY;  Surgeon: Belva Crome, MD;  Location: [facility];  Service: Cardiovascular;  Laterality: N/A;   [place], 2003, 2012   Laminectomy X 3    Family History  Problem Relation Age of Onset   Heart disease Mother    Hypertension Mother    Social History   Socioeconomic History   Marital status: Widowed    Spouse name: Not on file   Number of children: 4   Years of education: Not on file   Highest education level: Not on file  Occupational History  Not on file  Tobacco Use   Smoking status: Every Day    Packs/day: 0.75    Years: 53.00    Total pack years: 39.75    Types: Cigarettes   Smokeless tobacco: Current    Types: Chew  Vaping Use   Vaping Use: Never used  Substance and Sexual Activity   Alcohol use: No   Drug use: No   Sexual activity: Not Currently  Other Topics Concern   Not on file  Social History Narrative   Not on file   Social Determinants of Health   Financial Resource Strain: Not on file  Food Insecurity: No Food Insecurity (03/23/2020)   Hunger Vital Sign    Worried About Running Out of Food in the Last Year: Never true    Ran Out of Food in the Last Year: Never true  Transportation Needs: Unmet Transportation Needs (01/06/2021)   PRAPARE - Hydrologist (Medical): Yes    Lack of Transportation (Non-Medical): Yes  Physical Activity: Not on file  Stress: Not on file  Social Connections: Not on file    Review of Systems  Constitutional:  Negative for chills, fatigue and fever.  HENT:  Negative for congestion, ear pain and sore throat.   Respiratory:  Positive for shortness of breath (occasionally). Negative for cough.   Cardiovascular:  Negative for chest pain.  Gastrointestinal:  Negative for  abdominal pain, constipation, diarrhea, nausea and vomiting.  Endocrine: Positive for polyphagia. Negative for polydipsia and polyuria.  Genitourinary:  Negative for dysuria and frequency.  Musculoskeletal:  Positive for arthralgias (BL hips. BL shoulders.) and back pain. Negative for myalgias.  Skin:        Still having sores on legs and on buttock   Neurological:  Negative for dizziness and headaches.  Psychiatric/Behavioral:  Negative for dysphoric mood. The patient is not nervous/anxious.        No dysphoria   Objective:  BP (!) 104/58   Pulse 61   Resp 18   Ht '5\' 11"'$  (1.803 m)   Wt 278 lb (126.1 kg)   SpO2 97%   BMI 38.77 kg/m      07/03/2022   10:24 AM 06/12/2022    9:44 AM 05/25/2022   10:26 AM  BP/Weight  Systolic BP 546 270 94  Diastolic BP 58 68 60  Wt. (Lbs) 278 270 272  BMI 38.77 kg/m2 37.66 kg/m2 37.94 kg/m2    Physical Exam Vitals reviewed.  Constitutional:      Appearance: Normal appearance.  Neck:     Vascular: No carotid bruit.  Cardiovascular:     Rate and Rhythm: Normal rate and regular rhythm.     Pulses: Normal pulses.     Heart sounds: Normal heart sounds.  Pulmonary:     Effort: Pulmonary effort is normal.     Breath sounds: Normal breath sounds. No wheezing, rhonchi or rales.  Abdominal:     General: Bowel sounds are normal.     Palpations: Abdomen is soft.     Tenderness: There is no abdominal tenderness.  Skin:    Comments: Feet very dirty.  Stasis dermatitis.  Ulcer on left lateral leg fat exposed.  Ulcer on right buttock/lateral thigh 1 cm deep. 2 cm in diameter.   Neurological:     Mental Status: He is alert.  Psychiatric:        Mood and Affect: Mood normal.        Behavior: Behavior normal.  Diabetic Foot Exam - Simple   Simple Foot Form Diabetic Foot exam was performed with the following findings: Yes 07/03/2022 10:58 AM  Visual Inspection See comments: Yes Sensation Testing Intact to touch and monofilament testing  bilaterally: Yes Pulse Check Posterior Tibialis and Dorsalis pulse intact bilaterally: Yes Comments Feet and shoes are very dirty.  No open sores on feet.  Thickened nails BL feet.       Lab Results  Component Value Date   WBC 10.7 07/03/2022   HGB 12.1 (L) 07/03/2022   HCT 38.0 07/03/2022   PLT 249 07/03/2022   GLUCOSE 129 (H) 07/03/2022   CHOL 134 07/03/2022   TRIG 272 (H) 07/03/2022   HDL 43 07/03/2022   LDLCALC 49 07/03/2022   ALT 9 07/03/2022   AST 11 07/03/2022   NA 145 (H) 07/03/2022   K 4.5 07/03/2022   CL 104 07/03/2022   CREATININE 0.73 (L) 07/03/2022   BUN 16 07/03/2022   CO2 26 07/03/2022   TSH 3.200 05/01/2022   INR 0.9 08/30/2020   HGBA1C 5.7 (H) 07/03/2022      Assessment & Plan:   Problem List Items Addressed This Visit       Cardiovascular and Mediastinum   CHF (congestive heart failure) ([place]) - Primary    The current medical regimen is effective;  continue present plan and medications.         Nervous and Auditory   Alzheimer's dementia with mood disturbance (HCC)    The current medical regimen is effective;  continue present plan and medications.        Musculoskeletal and Integument   Venous stasis dermatitis of both lower extremities    Refer to wound care center      Relevant Orders   AMB referral to wound care center     Other   Prediabetes    Recommend continue to work on eating healthy diet and exercise.       Relevant Orders   Hemoglobin A1c (Completed)   Hyperlipidemia    Well controlled.  No changes to medicines.  Continue to work on eating a healthy diet and exercise.  Labs drawn today.        Relevant Orders   CBC with Differential/Platelet (Completed)   Comprehensive metabolic panel (Completed)   Lipid panel (Completed)   Uncomplicated opioid dependence ([place])   Tremor     Please Call us back with the name of memory medicine which when you stopped it, the jerking/tremor improved.       Pressure  injury of right buttock, stage 2 ([place])    Refer to wound care center      Relevant Orders   AMB referral to wound care center   B12 deficiency    Check b12/mma      Relevant Orders   Vitamin B12 (Completed)   Methylmalonic acid, serum (Completed)   Ulcer of leg, chronic, left, with fat layer exposed ([place])    Referred to wound care center      Relevant Orders   AMB referral to wound care center   Abnormal urine odor    Ordered UA.      Relevant Orders   POCT URINALYSIS DIP (CLINITEK) (Completed)   RESOLVED: Hyperkalemia  .  No orders of the defined types were placed in this encounter.   Orders Placed This Encounter  Procedures   CBC with Differential/Platelet   Comprehensive metabolic panel   Lipid panel   Hemoglobin A1c  Vitamin B12   Methylmalonic acid, serum   Cardiovascular Risk Assessment   AMB referral to wound care center   POCT URINALYSIS DIP (CLINITEK)    Follow-up: Return in about 3 months (around 10/03/2022) for chronic fasting.  An After Visit Summary was printed and given to the patient.  Rochel Brome, MD [facility] [phone]

## 2022-07-03 NOTE — Assessment & Plan Note (Signed)
  Please Call us back with the name of memory medicine which when you stopped it, the jerking/tremor improved.

## 2022-07-03 NOTE — Assessment & Plan Note (Signed)
Referred to wound care center

## 2022-07-03 NOTE — Assessment & Plan Note (Signed)
Well controlled.  ?No changes to medicines.  ?Continue to work on eating a healthy diet and exercise.  ?Labs drawn today.  ?

## 2022-07-06 LAB — CBC WITH DIFFERENTIAL/PLATELET
Basophils Absolute: 0.1 10*3/uL (ref 0.0–0.2)
Basos: 1 %
EOS (ABSOLUTE): 0.7 10*3/uL — ABNORMAL HIGH (ref 0.0–0.4)
Eos: 7 %
Hematocrit: 38 % (ref 37.5–51.0)
Hemoglobin: 12.1 g/dL — ABNORMAL LOW (ref 13.0–17.7)
Immature Grans (Abs): 0 10*3/uL (ref 0.0–0.1)
Immature Granulocytes: 0 %
Lymphocytes Absolute: 2.7 10*3/uL (ref 0.7–3.1)
Lymphs: 26 %
MCH: 27.8 pg (ref 26.6–33.0)
MCHC: 31.8 g/dL (ref 31.5–35.7)
MCV: 87 fL (ref 79–97)
Monocytes Absolute: 0.8 10*3/uL (ref 0.1–0.9)
Monocytes: 7 %
Neutrophils Absolute: 6.4 10*3/uL (ref 1.4–7.0)
Neutrophils: 59 %
Platelets: 249 10*3/uL (ref 150–450)
RBC: 4.36 x10E6/uL (ref 4.14–5.80)
RDW: 14.5 % (ref 11.6–15.4)
WBC: 10.7 10*3/uL (ref 3.4–10.8)

## 2022-07-06 LAB — LIPID PANEL
Chol/HDL Ratio: 3.1 ratio (ref 0.0–5.0)
Cholesterol, Total: 134 mg/dL (ref 100–199)
HDL: 43 mg/dL (ref >39)
LDL Chol Calc (NIH): 49 mg/dL (ref 0–99)
Triglycerides: 272 mg/dL — ABNORMAL HIGH (ref 0–149)
VLDL Cholesterol Cal: 42 mg/dL — ABNORMAL HIGH (ref 5–40)

## 2022-07-06 LAB — COMPREHENSIVE METABOLIC PANEL
ALT: 9 IU/L (ref 0–44)
AST: 11 IU/L (ref 0–40)
Albumin/Globulin Ratio: 1.7 (ref 1.2–2.2)
Albumin: 3.8 g/dL (ref 3.8–4.8)
Alkaline Phosphatase: 83 IU/L (ref 44–121)
BUN/Creatinine Ratio: 22 (ref 10–24)
BUN: 16 mg/dL (ref 8–27)
Bilirubin Total: <0.2 mg/dL (ref 0.0–1.2)
CO2: 26 mmol/L (ref 20–29)
Calcium: 8.6 mg/dL (ref 8.6–10.2)
Chloride: 104 mmol/L (ref 96–106)
Creatinine, Ser: 0.73 mg/dL — ABNORMAL LOW (ref 0.76–1.27)
Globulin, Total: 2.2 g/dL (ref 1.5–4.5)
Glucose: 129 mg/dL — ABNORMAL HIGH (ref 70–99)
Potassium: 4.5 mmol/L (ref 3.5–5.2)
Sodium: 145 mmol/L — ABNORMAL HIGH (ref 134–144)
Total Protein: 6 g/dL (ref 6.0–8.5)
eGFR: 97 mL/min/{1.73_m2} (ref >59)

## 2022-07-06 LAB — VITAMIN B12: Vitamin B-12: 971 pg/mL (ref 232–1245)

## 2022-07-06 LAB — HEMOGLOBIN A1C
Est. average glucose Bld gHb Est-mCnc: 117 mg/dL
Hgb A1c MFr Bld: 5.7 % — ABNORMAL HIGH (ref 4.8–5.6)

## 2022-07-06 LAB — METHYLMALONIC ACID, SERUM: Methylmalonic Acid: 150 nmol/L (ref 0–378)

## 2022-07-08 NOTE — Assessment & Plan Note (Signed)
The current medical regimen is effective;  continue present plan and medications.  

## 2022-07-08 NOTE — Assessment & Plan Note (Signed)
Ordered UA

## 2022-07-11 ENCOUNTER — Other Ambulatory Visit: Payer: Self-pay

## 2022-07-11 MED ORDER — OMEGA-3-ACID ETHYL ESTERS 1 G PO CAPS
2.0000 g | ORAL_CAPSULE | Freq: Two times a day (BID) | ORAL | 0 refills | Status: DC
Start: 2022-07-11 — End: 2022-10-13

## 2022-07-11 MED ORDER — STIOLTO RESPIMAT 2.5-2.5 MCG/ACT IN AERS: 2.0000 | INHALATION_SPRAY | Freq: Every day | RESPIRATORY_TRACT | 6 refills | Status: DC

## 2022-07-12 DIAGNOSIS — L89210 Pressure ulcer of right hip, unstageable: Secondary | ICD-10-CM | POA: Diagnosis not present

## 2022-07-25 ENCOUNTER — Other Ambulatory Visit: Payer: Self-pay | Admitting: Family Medicine

## 2022-07-26 DIAGNOSIS — F1190 Opioid use, unspecified, uncomplicated: Secondary | ICD-10-CM | POA: Diagnosis not present

## 2022-07-26 DIAGNOSIS — Z79899 Other long term (current) drug therapy: Secondary | ICD-10-CM | POA: Diagnosis not present

## 2022-07-26 DIAGNOSIS — M545 Low back pain, unspecified: Secondary | ICD-10-CM | POA: Diagnosis not present

## 2022-07-26 DIAGNOSIS — G8929 Other chronic pain: Secondary | ICD-10-CM | POA: Diagnosis not present

## 2022-07-26 DIAGNOSIS — M25561 Pain in right knee: Secondary | ICD-10-CM | POA: Diagnosis not present

## 2022-07-30 DIAGNOSIS — Z79899 Other long term (current) drug therapy: Secondary | ICD-10-CM | POA: Diagnosis not present

## 2022-08-07 ENCOUNTER — Other Ambulatory Visit: Payer: Self-pay | Admitting: Family Medicine

## 2022-08-18 ENCOUNTER — Other Ambulatory Visit: Payer: Self-pay | Admitting: Family Medicine

## 2022-08-20 DIAGNOSIS — L89213 Pressure ulcer of right hip, stage 3: Secondary | ICD-10-CM | POA: Diagnosis not present

## 2022-08-22 ENCOUNTER — Other Ambulatory Visit: Payer: Self-pay | Admitting: Family Medicine

## 2022-08-22 DIAGNOSIS — R351 Nocturia: Secondary | ICD-10-CM

## 2022-08-26 DIAGNOSIS — J449 Chronic obstructive pulmonary disease, unspecified: Secondary | ICD-10-CM | POA: Diagnosis not present

## 2022-08-30 ENCOUNTER — Telehealth: Payer: Self-pay

## 2022-08-30 DIAGNOSIS — L89213 Pressure ulcer of right hip, stage 3: Secondary | ICD-10-CM | POA: Diagnosis not present

## 2022-08-30 NOTE — Telephone Encounter (Signed)
Shawn Osborn called requesting stiolto samples.  We do not have stiolto at this time.  Dr, Bearl Mulberry approved for him to substitute anora 1 puff today.  Patient will pickup samples since he has paid over $100 the last 2 months for the stiolto.

## 2022-09-06 DIAGNOSIS — L89213 Pressure ulcer of right hip, stage 3: Secondary | ICD-10-CM | POA: Diagnosis not present

## 2022-09-12 ENCOUNTER — Other Ambulatory Visit: Payer: Self-pay | Admitting: Family Medicine

## 2022-09-13 DIAGNOSIS — L89213 Pressure ulcer of right hip, stage 3: Secondary | ICD-10-CM | POA: Diagnosis not present

## 2022-09-20 DIAGNOSIS — L97919 Non-pressure chronic ulcer of unspecified part of right lower leg with unspecified severity: Secondary | ICD-10-CM | POA: Diagnosis not present

## 2022-09-20 DIAGNOSIS — L89213 Pressure ulcer of right hip, stage 3: Secondary | ICD-10-CM | POA: Diagnosis not present

## 2022-09-20 DIAGNOSIS — L89312 Pressure ulcer of right buttock, stage 2: Secondary | ICD-10-CM | POA: Diagnosis not present

## 2022-09-20 DIAGNOSIS — I83018 Varicose veins of right lower extremity with ulcer other part of lower leg: Secondary | ICD-10-CM | POA: Diagnosis not present

## 2022-09-20 DIAGNOSIS — I83029 Varicose veins of left lower extremity with ulcer of unspecified site: Secondary | ICD-10-CM | POA: Diagnosis not present

## 2022-09-26 DIAGNOSIS — J449 Chronic obstructive pulmonary disease, unspecified: Secondary | ICD-10-CM | POA: Diagnosis not present

## 2022-09-27 ENCOUNTER — Other Ambulatory Visit: Payer: Self-pay | Admitting: Family Medicine

## 2022-09-27 DIAGNOSIS — L89213 Pressure ulcer of right hip, stage 3: Secondary | ICD-10-CM | POA: Diagnosis not present

## 2022-09-27 DIAGNOSIS — L89312 Pressure ulcer of right buttock, stage 2: Secondary | ICD-10-CM | POA: Diagnosis not present

## 2022-09-27 DIAGNOSIS — L97919 Non-pressure chronic ulcer of unspecified part of right lower leg with unspecified severity: Secondary | ICD-10-CM | POA: Diagnosis not present

## 2022-09-27 DIAGNOSIS — I83029 Varicose veins of left lower extremity with ulcer of unspecified site: Secondary | ICD-10-CM | POA: Diagnosis not present

## 2022-09-27 DIAGNOSIS — I83018 Varicose veins of right lower extremity with ulcer other part of lower leg: Secondary | ICD-10-CM | POA: Diagnosis not present

## 2022-09-27 DIAGNOSIS — M1A09X0 Idiopathic chronic gout, multiple sites, without tophus (tophi): Secondary | ICD-10-CM

## 2022-09-28 MED ORDER — PREGABALIN 50 MG PO CAPS: 50.0000 mg | ORAL_CAPSULE | Freq: Every day | ORAL | 2 refills | Status: DC

## 2022-10-03 ENCOUNTER — Telehealth: Payer: Self-pay

## 2022-10-03 DIAGNOSIS — J441 Chronic obstructive pulmonary disease with (acute) exacerbation: Secondary | ICD-10-CM

## 2022-10-03 NOTE — Telephone Encounter (Signed)
Given high dose trelegy samples. Dr. Tobie Poet

## 2022-10-03 NOTE — Telephone Encounter (Signed)
Patient called stating that he was given Stiolto samples the last time and now he is out of the medication. We no longer have any samples of this medication and went and checked for this as well. I told patient that I would inquire from Dr. Tobie Poet what we needed to do about this. Please Advise we do have Trelegy in the closet, Breztri but I dont know about any others specifically.

## 2022-10-08 ENCOUNTER — Ambulatory Visit: Payer: Medicare Other | Admitting: Family Medicine

## 2022-10-08 DIAGNOSIS — L89213 Pressure ulcer of right hip, stage 3: Secondary | ICD-10-CM | POA: Diagnosis not present

## 2022-10-08 DIAGNOSIS — I83018 Varicose veins of right lower extremity with ulcer other part of lower leg: Secondary | ICD-10-CM | POA: Diagnosis not present

## 2022-10-08 DIAGNOSIS — I83029 Varicose veins of left lower extremity with ulcer of unspecified site: Secondary | ICD-10-CM | POA: Diagnosis not present

## 2022-10-08 DIAGNOSIS — L89312 Pressure ulcer of right buttock, stage 2: Secondary | ICD-10-CM | POA: Diagnosis not present

## 2022-10-08 DIAGNOSIS — L97919 Non-pressure chronic ulcer of unspecified part of right lower leg with unspecified severity: Secondary | ICD-10-CM | POA: Diagnosis not present

## 2022-10-12 ENCOUNTER — Other Ambulatory Visit: Payer: Self-pay | Admitting: Family Medicine

## 2022-10-15 DIAGNOSIS — I83029 Varicose veins of left lower extremity with ulcer of unspecified site: Secondary | ICD-10-CM | POA: Diagnosis not present

## 2022-10-15 DIAGNOSIS — L89213 Pressure ulcer of right hip, stage 3: Secondary | ICD-10-CM | POA: Diagnosis not present

## 2022-10-15 DIAGNOSIS — L97919 Non-pressure chronic ulcer of unspecified part of right lower leg with unspecified severity: Secondary | ICD-10-CM | POA: Diagnosis not present

## 2022-10-15 DIAGNOSIS — I83018 Varicose veins of right lower extremity with ulcer other part of lower leg: Secondary | ICD-10-CM | POA: Diagnosis not present

## 2022-10-15 DIAGNOSIS — L89312 Pressure ulcer of right buttock, stage 2: Secondary | ICD-10-CM | POA: Diagnosis not present

## 2022-10-15 MED ORDER — ALBUTEROL SULFATE HFA 108 (90 BASE) MCG/ACT IN AERS: 2.0000 | INHALATION_SPRAY | Freq: Four times a day (QID) | RESPIRATORY_TRACT | 3 refills | Status: DC | PRN

## 2022-10-16 ENCOUNTER — Other Ambulatory Visit: Payer: Self-pay | Admitting: Family Medicine

## 2022-10-16 MED ORDER — PREGABALIN 50 MG PO CAPS: 50.0000 mg | ORAL_CAPSULE | Freq: Three times a day (TID) | ORAL | 2 refills | Status: DC

## 2022-10-17 ENCOUNTER — Encounter: Payer: Self-pay | Admitting: Family Medicine

## 2022-10-17 ENCOUNTER — Ambulatory Visit (INDEPENDENT_AMBULATORY_CARE_PROVIDER_SITE_OTHER): Payer: Medicare Other | Admitting: Family Medicine

## 2022-10-17 VITALS — BP 118/64 | HR 72 | Temp 96.6°F | Resp 18 | Ht 70.0 in | Wt 287.0 lb

## 2022-10-17 DIAGNOSIS — G894 Chronic pain syndrome: Secondary | ICD-10-CM

## 2022-10-17 DIAGNOSIS — I110 Hypertensive heart disease with heart failure: Secondary | ICD-10-CM | POA: Diagnosis not present

## 2022-10-17 DIAGNOSIS — F01518 Vascular dementia, unspecified severity, with other behavioral disturbance: Secondary | ICD-10-CM

## 2022-10-17 DIAGNOSIS — I5042 Chronic combined systolic (congestive) and diastolic (congestive) heart failure: Secondary | ICD-10-CM | POA: Diagnosis not present

## 2022-10-17 DIAGNOSIS — E66813 Obesity, class 3: Secondary | ICD-10-CM

## 2022-10-17 DIAGNOSIS — G300 Alzheimer's disease with early onset: Secondary | ICD-10-CM

## 2022-10-17 DIAGNOSIS — J441 Chronic obstructive pulmonary disease with (acute) exacerbation: Secondary | ICD-10-CM

## 2022-10-17 DIAGNOSIS — Z23 Encounter for immunization: Secondary | ICD-10-CM | POA: Diagnosis not present

## 2022-10-17 DIAGNOSIS — F02B3 Dementia in other diseases classified elsewhere, moderate, with mood disturbance: Secondary | ICD-10-CM

## 2022-10-17 DIAGNOSIS — J9611 Chronic respiratory failure with hypoxia: Secondary | ICD-10-CM

## 2022-10-17 DIAGNOSIS — G4733 Obstructive sleep apnea (adult) (pediatric): Secondary | ICD-10-CM

## 2022-10-17 DIAGNOSIS — Z6841 Body Mass Index (BMI) 40.0 and over, adult: Secondary | ICD-10-CM

## 2022-10-17 MED ORDER — IPRATROPIUM-ALBUTEROL 0.5-2.5 (3) MG/3ML IN SOLN: 3.0000 mL | RESPIRATORY_TRACT | 2 refills | Status: DC | PRN

## 2022-10-17 MED ORDER — TRIAMCINOLONE ACETONIDE 40 MG/ML IJ SUSP
80.0000 mg | Freq: Once | INTRAMUSCULAR | Status: AC
  Administered 2022-10-17: 80 mg via INTRAMUSCULAR

## 2022-10-17 MED ORDER — IPRATROPIUM-ALBUTEROL 0.5-2.5 (3) MG/3ML IN SOLN
3.0000 mL | Freq: Once | RESPIRATORY_TRACT | Status: AC
  Administered 2022-10-17: 3 mL via RESPIRATORY_TRACT

## 2022-10-17 MED ORDER — BREZTRI AEROSPHERE 160-9-4.8 MCG/ACT IN AERO: 2.0000 | INHALATION_SPRAY | Freq: Two times a day (BID) | RESPIRATORY_TRACT | 0 refills | Status: DC

## 2022-10-17 MED ORDER — CEFTRIAXONE SODIUM 1 G IJ SOLR
1.0000 g | Freq: Once | INTRAMUSCULAR | Status: AC
  Administered 2022-10-17: 1 g via INTRAMUSCULAR

## 2022-10-17 MED ORDER — PREDNISONE 20 MG PO TABS: ORAL_TABLET | ORAL | 0 refills | Status: AC

## 2022-10-17 MED ORDER — LEVOFLOXACIN 750 MG PO TABS: 750.0000 mg | ORAL_TABLET | Freq: Every day | ORAL | 0 refills | Status: DC

## 2022-10-17 NOTE — Patient Instructions (Signed)
COPD exacerbation:  Patient given Kenalog 80 mg IM and Rocephin 1000 mg IM. Patient given prednisone taper 20 mg 3 daily x3 days, then 2 daily x3 days, then 1 daily x3 days, then discontinue. Antibiotic given: Levaquin 750 mg daily x7 days. Prescription for DuoNeb treatments sent. Sample of breztri given 2 puffs twice daily. Sent referral to our pharmacist to reach out to you to try to get patient assistance for breztri.  Referring to the [facility] in .  Patient is seen at the pain clinic in [place] like to be closer to home.

## 2022-10-17 NOTE — Progress Notes (Addendum)
Subjective:  Patient ID: Shawn Osborn, male    DOB: [birth year]  Age: 71 y.o. MRN: [id number]  Chief Complaint  Patient presents with   Emphysema   Prediabetes   Dementia    HPI COPD: On stiolto 2 puffs daily, albuterol HFA 2 puffs four times a day as needed. Chronic Pain syndrome: Managed by pain clinic. ON suboxone and goes to pain clinic.  Lyrica 50 mg three times a day, and allopurinol 300 mg daily. Patient has chronic back pain, gout, knee pain. Patient does not want to keep going to this pain clinic. It is too far away.   Alzheimer's Dementia: aricept 10 mg before bed and Namenda xr 28 mg once daily.  Depression: zoloft 100 mg once daily. ON seroquel 50 mg before bed, rozerem 8 mg before bed, and hydroxyzine 50 mg before bed.      10/30/2022    4:14 PM 10/17/2022   10:53 AM 04/17/2022    1:57 PM  PHQ9 SCORE ONLY  PHQ-9 Total Score 0 0 0    Hypertensive heart disease with CORONARY ARTERY DISEASE/CONGESTIVE HEART FAILURE: On propranolol ER 60 mg daily (also for tremor.), crestor 5 mg before bed.  Vitamin D deficiency: on Vitamin D 50K weekly.  GERD: well controlled on omeprazole 40 mg daily.  BPH: on tamsulosin 0.4 mg before bed.  Neuropathy: on lyrica 50 mg three times a day. MMA high consistent with mild b12 deficiency. Patient does not appear to be taking b12.  Patient has torn rotator cuff in left shoulder.  Right shoulder hurts also and aches down his arms. He feels pain runs down arms.    Current Outpatient Medications on File Prior to Visit  Medication Sig Dispense Refill   albuterol (PROVENTIL) (2.5 MG/3ML) 0.083% nebulizer solution Take 3 mLs (2.5 mg total) by nebulization every 6 (six) hours as needed for wheezing or shortness of breath. 120 mL 12   ferrous sulfate 324 MG TBEC Take 324 mg by mouth daily with breakfast. Take 1 every other day     hydrocortisone 2.5%-Eucerin equivalent 1:1 cream mixture Apply topically 2 (two) times daily. 120 g 0   memantine  (NAMENDA XR) 28 MG CP24 24 hr capsule TAKE ONE CAPSULE BY MOUTH AT BEDTIME 90 capsule 1   triamcinolone cream (KENALOG) 0.1 % Apply 1 application. topically 2 (two) times daily as needed (to ears for itching). 30 g 2   No current facility-administered medications on file prior to visit.   Past Medical History:  Diagnosis Date   Acute combined systolic and diastolic CHF, NYHA class 3 (HCC) 10/08/2019   Acute delirium 10/08/2019   Acute exacerbation of CHF (congestive heart failure) (HCC) 10/08/2019   Acute gout of multiple sites 07/01/2016   Acute respiratory failure with hypoxia and hypercapnia (HCC) 10/08/2019   Allergy    Anxiety    Arthritis    Gout, osteoarthritis in Back and Knees   Auditory hallucinations 05/01/2022   Cataract    CHF (congestive heart failure) (HCC)    Chronic bronchitis    Community acquired pneumonia 10/08/2019   COPD (chronic obstructive pulmonary disease) (HCC) 10/08/2019   Dementia (HCC) 10/08/2019   Elevated troponin 10/08/2019   Emphysema of lung (HCC)    Essential hypertension 07/03/2016   GERD (gastroesophageal reflux disease)    History of kidney stones    HLD (hyperlipidemia) 10/08/2019   Hypercapnic respiratory failure (HCC) 10/08/2019   Hyperlipidemia    Hypertension    Hypertensive heart disease  10/08/2019   Morbid obesity due to excess calories (HCC) 10/08/2019   Narcotic overdose (HCC) 10/08/2019   Neuropathy    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   NSTEMI (non-ST elevated myocardial infarction) (HCC) 10/08/2019   Obstructive sleep apnea    Occlusion and stenosis of carotid artery without mention of cerebral infarction 12/13/2011   OSA on CPAP 10/08/2019   Pain in joint of left shoulder 01/12/2019   Paranoia (psychosis) (HCC) 07/01/2016   Poorly-controlled hypertension 10/08/2019   Prediabetes    Psychotic disorder with delusions (HCC) 07/01/2016   Respiratory failure with hypoxia and hypercapnia (HCC) 10/08/2019   Restless leg  syndrome 07/03/2016   Rheumatoid arthritis(714.0)    RLS (restless legs syndrome)    Sepsis (HCC) 10/08/2019   Sleep apnea    Spinal stenosis    Syncope 04/03/2019   Tear of left rotator cuff 01/12/2019   Toxic metabolic encephalopathy 10/08/2019   UTI (urinary tract infection) 10/08/2019   Vascular dementia with behavioral disturbance (HCC) 07/06/2016   Visual hallucinations 05/01/2022   Past Surgical History:  Procedure Laterality Date   APPENDECTOMY     CARPAL TUNNEL RELEASE     CORONARY PRESSURE/FFR STUDY N/A 10/20/2019   Procedure: INTRAVASCULAR PRESSURE WIRE/FFR STUDY;  Surgeon: Lyn Records, MD;  Location: [facility];  Service: Cardiovascular;  Laterality: N/A;   EYE SURGERY     LEFT HEART CATH AND CORONARY ANGIOGRAPHY N/A 10/20/2019   Procedure: LEFT HEART CATH AND CORONARY ANGIOGRAPHY;  Surgeon: Lyn Records, MD;  Location: [facility];  Service: Cardiovascular;  Laterality: N/A;   SPINE SURGERY  1998, 2003, 2012   Laminectomy X 3    Family History  Problem Relation Age of Onset   Heart disease Mother    Hypertension Mother    Social History   Socioeconomic History   Marital status: Widowed    Spouse name: Not on file   Number of children: 4   Years of education: Not on file   Highest education level: Not on file  Occupational History   Not on file  Tobacco Use   Smoking status: Every Day    Packs/day: 0.75    Years: 53.00    Additional pack years: 0.00    Total pack years: 39.75    Types: Cigarettes   Smokeless tobacco: Current    Types: Chew  Vaping Use   Vaping Use: Never used  Substance and Sexual Activity   Alcohol use: No   Drug use: No   Sexual activity: Not Currently  Other Topics Concern   Not on file  Social History Narrative   Not on file   Social Determinants of Health   Financial Resource Strain: Low Risk  (10/30/2022)   Overall Financial Resource Strain (CARDIA)    Difficulty of Paying Living Expenses: Not very hard   Food Insecurity: No Food Insecurity (10/30/2022)   Hunger Vital Sign    Worried About Running Out of Food in the Last Year: Never true    Ran Out of Food in the Last Year: Never true  Transportation Needs: No Transportation Needs (10/30/2022)   PRAPARE - Administrator, Civil Service (Medical): No    Lack of Transportation (Non-Medical): No  Physical Activity: Inactive (10/30/2022)   Exercise Vital Sign    Days of Exercise per Week: 0 days    Minutes of Exercise per Session: 0 min  Stress: No Stress Concern Present (10/30/2022)   [place]  [employer] - Occupational Stress Questionnaire    Feeling of Stress : Not at all  Social Connections: Moderately Isolated (10/30/2022)   Social Connection and Isolation Panel [NHANES]    Frequency of Communication with Friends and Family: More than three times a week    Frequency of Social Gatherings with Friends and Family: More than three times a week    Attends Religious Services: More than 4 times per year    Active Member of [employer] or Organizations: No    Attends Banker Meetings: Never    Marital Status: Widowed    Review of Systems  Constitutional:  Negative for chills and fever.  HENT:  Positive for congestion. Negative for rhinorrhea and sore throat.   Respiratory:  Positive for cough and shortness of breath.   Cardiovascular:  Negative for chest pain and palpitations.  Gastrointestinal:  Positive for constipation. Negative for abdominal pain, diarrhea, nausea and vomiting.  Genitourinary:  Negative for dysuria and urgency.  Musculoskeletal:  Positive for arthralgias (bilateral shoulder pain, right knee pain and right hip pain) and back pain. Negative for myalgias.  Neurological:  Negative for dizziness and headaches.       Balance issues.    Psychiatric/Behavioral:  Positive for hallucinations. Negative for dysphoric mood. The patient is not nervous/anxious.    Objective:  BP 118/64    Pulse 72   Temp (!) 96.6 F (35.9 C)   Resp 18   Ht 5\' 10"  (1.778 m)   Wt 287 lb (130.2 kg)   BMI 41.18 kg/m      12/28/2022    8:35 AM 10/17/2022   10:47 AM 07/03/2022   10:24 AM  BP/Weight  Systolic BP 158 118 104  Diastolic BP 62 64 58  Wt. (Lbs) 297 287 278  BMI 42.62 kg/m2 41.18 kg/m2 38.77 kg/m2    Physical Exam Vitals reviewed.  Constitutional:      Appearance: Normal appearance. He is obese.  HENT:     Right Ear: Tympanic membrane normal.     Left Ear: Tympanic membrane normal.     Nose: No congestion or rhinorrhea.     Mouth/Throat:     Pharynx: No oropharyngeal exudate or posterior oropharyngeal erythema.  Neck:     Vascular: No carotid bruit.  Cardiovascular:     Rate and Rhythm: Normal rate and regular rhythm.     Pulses: Normal pulses.     Heart sounds: Normal heart sounds.  Pulmonary:     Effort: Pulmonary effort is normal.     Breath sounds: Wheezing and rhonchi present. No rales.  Abdominal:     General: Bowel sounds are normal.     Palpations: Abdomen is soft.     Tenderness: There is no abdominal tenderness.  Musculoskeletal:     Right lower leg: Edema (mild. stasis dermatitis) present.     Left lower leg: Edema (mild. stasis dermatitis) present.  Neurological:     Mental Status: He is alert.  Psychiatric:        Mood and Affect: Mood normal.        Behavior: Behavior normal.     Diabetic Foot Exam - Simple   No data filed      Lab Results  Component Value Date   WBC 11.3 (H) 10/17/2022   HGB 12.5 (L) 10/17/2022   HCT 39.3 10/17/2022   PLT 255 10/17/2022   GLUCOSE 89 10/17/2022   CHOL 139 10/17/2022   TRIG 127 10/17/2022  HDL 54 10/17/2022   LDLCALC 63 10/17/2022   ALT 10 10/17/2022   AST 16 10/17/2022   NA 145 (H) 10/17/2022   K 5.1 10/17/2022   CL 104 10/17/2022   CREATININE 0.70 (L) 10/17/2022   BUN 19 10/17/2022   CO2 26 10/17/2022   TSH 3.200 05/01/2022   INR 0.9 08/30/2020   HGBA1C 5.7 (H) 07/03/2022       Assessment & Plan:   Problem List Items Addressed This Visit       Respiratory   OSA on CPAP    Continue cpap with oxygen 2 L      Chronic respiratory failure with hypoxia    Continue oxygen 2 L daily.       RESOLVED: COPD exacerbation    Change stiolto to breztri 2 puffs twice daily as patient is unable to afford stiolto. He has not been taking the stiolto regularly, Prescription; Prednisone, levaquin. Nebulizer: duoneb given. Helped breathing.  Rocephin 1 gm IM x 1  Kenalog 80 mg IM x 1.  Sent referral to our pharmacist to reach out to you to try to get patient assistance for breztri.       Relevant Medications   ipratropium-albuterol (DUONEB) 0.5-2.5 (3) MG/3ML SOLN   Other Relevant Orders   AMB Referral to Chronic Care Management Services     Nervous and Auditory   Alzheimer's dementia with mood disturbance    Continue aricept 10 mg before bed and memantine xr 28 mg daily       Vascular dementia with behavioral disturbance    Continue crestor.        Other   Class 3 severe obesity due to excess calories with body mass index (BMI) of 40.0 to 44.9 in adult    Recommend healthy diet. Unable to exercise.       Chronic pain syndrome    Refer to integrative pain.        Relevant Orders   Ambulatory referral to Pain Clinic   Need for influenza vaccination   Relevant Orders   Flu Vaccine QUAD High Dose(Fluad) (Completed)   Other Visit Diagnoses     Hypertensive heart disease with chronic combined systolic and diastolic congestive heart failure (HCC)    -  Primary   Relevant Orders   CBC with Differential/Platelet (Completed)   Comprehensive metabolic panel (Completed)   Lipid panel (Completed)   Cardiovascular Risk Assessment (Completed)     .  Meds ordered this encounter  Medications   ipratropium-albuterol (DUONEB) 0.5-2.5 (3) MG/3ML SOLN    Sig: Take 3 mLs by nebulization every 4 (four) hours as needed.    Dispense:  360 mL    Refill:   2   triamcinolone acetonide (KENALOG-40) injection 80 mg   cefTRIAXone (ROCEPHIN) injection 1 g   predniSONE (DELTASONE) 20 MG tablet    Sig: Take 3 tablets (60 mg total) by mouth daily with breakfast for 3 days, THEN 2 tablets (40 mg total) daily with breakfast for 3 days, THEN 1 tablet (20 mg total) daily with breakfast for 3 days.    Dispense:  18 tablet    Refill:  0   DISCONTD: levofloxacin (LEVAQUIN) 750 MG tablet    Sig: Take 1 tablet (750 mg total) by mouth daily.    Dispense:  7 tablet    Refill:  0   ipratropium-albuterol (DUONEB) 0.5-2.5 (3) MG/3ML nebulizer solution 3 mL   DISCONTD: Budeson-Glycopyrrol-Formoterol (BREZTRI AEROSPHERE) 160-9-4.8 MCG/ACT AERO    Sig:  Inhale 2 puffs into the lungs in the morning and at bedtime.    Dispense:  1 each    Refill:  0    Orders Placed This Encounter  Procedures   Flu Vaccine QUAD High Dose(Fluad)   CBC with Differential/Platelet   Comprehensive metabolic panel   Lipid panel   Cardiovascular Risk Assessment   AMB Referral to Chronic Care Management Services   Ambulatory referral to Pain Clinic     Follow-up: Return in about 3 months (around 01/17/2023) for chronic fasting.  An After Visit Summary was printed and given to the patient.  Blane Ohara, MD [facility] [phone]

## 2022-10-18 LAB — CBC WITH DIFFERENTIAL/PLATELET
Basophils Absolute: 0.1 10*3/uL (ref 0.0–0.2)
Basos: 1 %
EOS (ABSOLUTE): 0.6 10*3/uL — ABNORMAL HIGH (ref 0.0–0.4)
Eos: 6 %
Hematocrit: 39.3 % (ref 37.5–51.0)
Hemoglobin: 12.5 g/dL — ABNORMAL LOW (ref 13.0–17.7)
Immature Grans (Abs): 0.1 10*3/uL (ref 0.0–0.1)
Immature Granulocytes: 1 %
Lymphocytes Absolute: 3.2 10*3/uL — ABNORMAL HIGH (ref 0.7–3.1)
Lymphs: 28 %
MCH: 27.5 pg (ref 26.6–33.0)
MCHC: 31.8 g/dL (ref 31.5–35.7)
MCV: 86 fL (ref 79–97)
Monocytes Absolute: 0.9 10*3/uL (ref 0.1–0.9)
Monocytes: 8 %
Neutrophils Absolute: 6.4 10*3/uL (ref 1.4–7.0)
Neutrophils: 56 %
Platelets: 255 10*3/uL (ref 150–450)
RBC: 4.55 x10E6/uL (ref 4.14–5.80)
RDW: 15.5 % — ABNORMAL HIGH (ref 11.6–15.4)
WBC: 11.3 10*3/uL — ABNORMAL HIGH (ref 3.4–10.8)

## 2022-10-18 LAB — LIPID PANEL
Chol/HDL Ratio: 2.6 ratio (ref 0.0–5.0)
Cholesterol, Total: 139 mg/dL (ref 100–199)
HDL: 54 mg/dL (ref >39)
LDL Chol Calc (NIH): 63 mg/dL (ref 0–99)
Triglycerides: 127 mg/dL (ref 0–149)
VLDL Cholesterol Cal: 22 mg/dL (ref 5–40)

## 2022-10-18 LAB — COMPREHENSIVE METABOLIC PANEL
ALT: 10 IU/L (ref 0–44)
AST: 16 IU/L (ref 0–40)
Albumin/Globulin Ratio: 1.4 (ref 1.2–2.2)
Albumin: 3.9 g/dL (ref 3.8–4.8)
Alkaline Phosphatase: 83 IU/L (ref 44–121)
BUN/Creatinine Ratio: 27 — ABNORMAL HIGH (ref 10–24)
BUN: 19 mg/dL (ref 8–27)
Bilirubin Total: <0.2 mg/dL (ref 0.0–1.2)
CO2: 26 mmol/L (ref 20–29)
Calcium: 8.9 mg/dL (ref 8.6–10.2)
Chloride: 104 mmol/L (ref 96–106)
Creatinine, Ser: 0.7 mg/dL — ABNORMAL LOW (ref 0.76–1.27)
Globulin, Total: 2.7 g/dL (ref 1.5–4.5)
Glucose: 89 mg/dL (ref 70–99)
Potassium: 5.1 mmol/L (ref 3.5–5.2)
Sodium: 145 mmol/L — ABNORMAL HIGH (ref 134–144)
Total Protein: 6.6 g/dL (ref 6.0–8.5)
eGFR: 98 mL/min/{1.73_m2} (ref >59)

## 2022-10-18 LAB — CARDIOVASCULAR RISK ASSESSMENT

## 2022-10-18 NOTE — Progress Notes (Signed)
Blood count abnormal. Wbc little up. Hb little low, but improving.  Liver function normal.  Kidney function normal.  Cholesterol: great! Continue current meds.

## 2022-10-21 DIAGNOSIS — Z23 Encounter for immunization: Secondary | ICD-10-CM | POA: Insufficient documentation

## 2022-10-21 DIAGNOSIS — J441 Chronic obstructive pulmonary disease with (acute) exacerbation: Secondary | ICD-10-CM | POA: Insufficient documentation

## 2022-10-21 NOTE — Assessment & Plan Note (Signed)
Recommend healthy diet. Unable to exercise.

## 2022-10-21 NOTE — Assessment & Plan Note (Signed)
Continue propranolol ER 60 mg once daily and crestor 5 mg daily.  

## 2022-10-21 NOTE — Assessment & Plan Note (Signed)
Continue oxygen 2 L daily. 

## 2022-10-21 NOTE — Assessment & Plan Note (Signed)
Continue cpap with oxygen 2 L 

## 2022-10-21 NOTE — Assessment & Plan Note (Signed)
Continue aricept 10 mg before bed and memantine xr 28 mg daily  

## 2022-10-21 NOTE — Assessment & Plan Note (Signed)
Continue crestor 

## 2022-10-21 NOTE — Assessment & Plan Note (Signed)
Refer to integrative pain.

## 2022-10-21 NOTE — Assessment & Plan Note (Signed)
>>  ASSESSMENT AND PLAN FOR HYPERTENSIVE HEART DISEASE WRITTEN ON 10/21/2022  6:47 PM BY COX, KIRSTEN, MD  Continue propranolol ER 60 mg once daily and crestor 5 mg daily.

## 2022-10-21 NOTE — Assessment & Plan Note (Addendum)
Change stiolto to breztri 2 puffs twice daily as patient is unable to afford stiolto. He has not been taking the stiolto regularly, Prescription; Prednisone, levaquin. Nebulizer: duoneb given. Helped breathing.  Rocephin 1 gm IM x 1  Kenalog 80 mg IM x 1.  Sent referral to our pharmacist to reach out to you to try to get patient assistance for breztri.

## 2022-10-22 DIAGNOSIS — I83209 Varicose veins of unspecified lower extremity with both ulcer of unspecified site and inflammation: Secondary | ICD-10-CM | POA: Diagnosis not present

## 2022-10-22 DIAGNOSIS — L97929 Non-pressure chronic ulcer of unspecified part of left lower leg with unspecified severity: Secondary | ICD-10-CM | POA: Diagnosis not present

## 2022-10-26 DIAGNOSIS — J449 Chronic obstructive pulmonary disease, unspecified: Secondary | ICD-10-CM | POA: Diagnosis not present

## 2022-10-29 DIAGNOSIS — L97929 Non-pressure chronic ulcer of unspecified part of left lower leg with unspecified severity: Secondary | ICD-10-CM | POA: Diagnosis not present

## 2022-10-29 DIAGNOSIS — I83209 Varicose veins of unspecified lower extremity with both ulcer of unspecified site and inflammation: Secondary | ICD-10-CM | POA: Diagnosis not present

## 2022-10-30 ENCOUNTER — Other Ambulatory Visit: Payer: Self-pay | Admitting: Family Medicine

## 2022-10-30 ENCOUNTER — Ambulatory Visit (INDEPENDENT_AMBULATORY_CARE_PROVIDER_SITE_OTHER): Payer: Medicare Other

## 2022-10-30 DIAGNOSIS — J441 Chronic obstructive pulmonary disease with (acute) exacerbation: Secondary | ICD-10-CM

## 2022-10-30 DIAGNOSIS — E782 Mixed hyperlipidemia: Secondary | ICD-10-CM

## 2022-10-30 NOTE — Plan of Care (Signed)
Chronic Care Management Provider Comprehensive Care Plan    10/30/2022 Name: Shawn Osborn MRN: [id number] DOB: 1950-11-20  Referral to Chronic Care Management (CCM) services was placed by Provider:  Dr. Tobie Poet on Date: 10-17-2022.  Chronic Condition 1: COPD Provider Assessment and Plan  Change stiolto to breztri 2 puffs twice daily as patient is unable to afford stiolto. He has not been taking the stiolto regularly, Prescription; Prednisone, levaquin. Nebulizer: duoneb given. Helped breathing.  Rocephin 1 gm IM x 1  Kenalog 80 mg IM x 1.  Sent referral to our pharmacist to reach out to you to try to get patient assistance for breztri.           Relevant Medications    ipratropium-albuterol (DUONEB) 0.5-2.5 (3) MG/3ML SOLN    predniSONE (DELTASONE) 20 MG tablet    levofloxacin (LEVAQUIN) 750 MG tablet    Budeson-Glycopyrrol-Formoterol (BREZTRI AEROSPHERE) 160-9-4.8 MCG/ACT AERO    Other Relevant Orders    AMB Referral to Chronic Care Management Services     Expected Outcome/Goals Addressed This Visit (Provider CCM goals/Provider Assessment and plan   Goal: CCM (COPD) EXPECTED OUTCOME: MONITOR, SELF-MANAGE AND REDUCE SYMPTOMS OF COPD   Symptom Management Condition 1: Take all medications as prescribed Attend all scheduled provider appointments Call provider office for new concerns or questions  call the Suicide and Crisis Lifeline: 988 call the [place] National Suicide Prevention Lifeline: (334) 736-3244 or TTY: 541-861-5626 TTY 6200336443) to talk to a trained counselor call 1-800-273-TALK (toll free, 24 hour hotline) if experiencing a Mental Health or [place]  eliminate smoking in my home identify and avoid work-related triggers identify and remove indoor air pollutants limit outdoor activity during cold weather listen for public air quality announcements every day develop a rescue plan eliminate symptom triggers at home follow rescue plan if  symptoms flare-up  Chronic Condition 2: HLD Provider Assessment and Plan Well controlled.  No changes to medicines.  Continue to work on eating a healthy diet and exercise.  Labs drawn today.   Relevant Orders     CBC with Differential/Platelet (Completed)    Comprehensive metabolic panel (Completed)    Lipid panel (Completed)     Expected Outcome/Goals Addressed This Visit (Provider CCM goals/Provider Assessment and plan   Goal: CCM (HLD) EXPECTED OUTCOME: MONITOR, SELF-MANAGE AND REDUCE SYMPTOMS OF HLD  Symptom Management Condition 2: Take all medications as prescribed Attend all scheduled provider appointments Call provider office for new concerns or questions  call the Suicide and Crisis Lifeline: 988 call the [place] National Suicide Prevention Lifeline: 949-156-6993 or TTY: 239-417-3285 TTY (515)707-5627) to talk to a trained counselor call 1-800-273-TALK (toll free, 24 hour hotline) if experiencing a Mental Health or [place]  call for medicine refill 2 or 3 days before it runs out take all medications exactly as prescribed call doctor with any symptoms you believe are related to your medicine call doctor when you experience any new symptoms go to all doctor appointments as scheduled adhere to prescribed diet: heart healthy diet  Problem List Patient Active Problem List   Diagnosis Date Noted   COPD exacerbation ([place]) 10/21/2022   Need for influenza vaccination 10/21/2022   B12 deficiency 07/03/2022   Ulcer of leg, chronic, left, with fat layer exposed ([place]) 07/03/2022   Abnormal urine odor 07/03/2022   Pressure injury of right buttock, stage 2 ([place]) 06/12/2022   Venous stasis dermatitis of both lower extremities 06/12/2022   Tremor 05/30/2022   Memory loss 05/01/2022  Chronic pain syndrome 35/46/5681   Uncomplicated opioid dependence ([place]) 03/07/2022   Hypertensive heart disease with diastolic heart failure ([place]) 02/27/2022   Chronic respiratory  failure with hypoxia (HCC) 12/03/2021   Chronic idiopathic constipation 12/03/2021   Spinal stenosis    Prediabetes    Obstructive sleep apnea    Neuropathy    Hyperlipidemia    GERD (gastroesophageal reflux disease)    Arthritis    Cataract    CHF (congestive heart failure) (HCC)    Idiopathic chronic gout of left hand without tophus 05/03/2020   Tremor of both hands 05/02/2020   Impingement syndrome of left shoulder 03/01/2020   Cigarette nicotine dependence with nicotine-induced disorder 02/08/2020   Coronary artery disease of native artery of native heart with stable angina pectoris (HCC)    Depressed left ventricular systolic function 27/51/7001   COPD (chronic obstructive pulmonary disease) ([place]) 10/08/2019   Alzheimer's dementia with mood disturbance ([place]) 10/08/2019   Hypertensive heart disease 10/08/2019   Class 3 severe obesity due to excess calories with body mass index (BMI) of 40.0 to 44.9 in adult ([place]) 10/08/2019   OSA on CPAP 10/08/2019   Morbid obesity due to excess calories ([place]) 10/08/2019   Elevated troponin 10/08/2019   Tear of left rotator cuff 01/12/2019   Pain in joint of left shoulder 01/12/2019   Vascular dementia with behavioral disturbance ([place]) 07/06/2016   Restless leg syndrome 07/03/2016   Occlusion and stenosis of carotid artery without mention of cerebral infarction 12/13/2011    Medication Management  Current Outpatient Medications:    albuterol (PROVENTIL) (2.5 MG/3ML) 0.083% nebulizer solution, Take 3 mLs (2.5 mg total) by nebulization every 6 (six) hours as needed for wheezing or shortness of breath., Disp: 120 mL, Rfl: 12   albuterol (VENTOLIN HFA) 108 (90 Base) MCG/ACT inhaler, Inhale 2 puffs into the lungs every 6 (six) hours as needed for wheezing or shortness of breath., Disp: 8 g, Rfl: 3   allopurinol (ZYLOPRIM) 300 MG tablet, TAKE ONE TABLET BY MOUTH EVERY DAY at 1pm, Disp: 30 tablet, Rfl: 2   Budeson-Glycopyrrol-Formoterol (BREZTRI  AEROSPHERE) 160-9-4.8 MCG/ACT AERO, Inhale 2 puffs into the lungs in the morning and at bedtime., Disp: 1 each, Rfl: 0   buprenorphine-naloxone (SUBOXONE) 8-2 mg SUBL SL tablet, Place 1 tablet under the tongue daily. Taking 2.5 daukt, Disp: , Rfl:    donepezil (ARICEPT) 10 MG tablet, Take 1 tablet (10 mg total) by mouth daily., Disp: 90 tablet, Rfl: 1   ferrous sulfate 324 MG TBEC, Take 324 mg by mouth daily with breakfast. Take 1 every other day, Disp: , Rfl:    furosemide (LASIX) 40 MG tablet, Take 1 tablet (40 mg total) by mouth 2 (two) times daily., Disp: 60 tablet, Rfl: 1   hydrocortisone 2.5%-Eucerin equivalent 1:1 cream mixture, Apply topically 2 (two) times daily., Disp: 120 g, Rfl: 0   hydrOXYzine (ATARAX) 50 MG tablet, TAKE ONE TABLET BY MOUTH AT BEDTIME, Disp: 30 tablet, Rfl: 2   ipratropium-albuterol (DUONEB) 0.5-2.5 (3) MG/3ML SOLN, Take 3 mLs by nebulization every 4 (four) hours as needed., Disp: 360 mL, Rfl: 2   levofloxacin (LEVAQUIN) 750 MG tablet, Take 1 tablet (750 mg total) by mouth daily., Disp: 7 tablet, Rfl: 0   memantine (NAMENDA XR) 28 MG CP24 24 hr capsule, TAKE ONE CAPSULE BY MOUTH AT BEDTIME, Disp: 90 capsule, Rfl: 1   omega-3 acid ethyl esters (LOVAZA) 1 g capsule, TAKE 2 CAPSULES BY MOUTH TWICE DAILY, Disp: 360 capsule,  Rfl: 0   omeprazole (PRILOSEC) 40 MG capsule, TAKE ONE CAPSULE BY MOUTH EVERY DAY, Disp: 30 capsule, Rfl: 2   pregabalin (LYRICA) 50 MG capsule, Take 1 capsule (50 mg total) by mouth 3 (three) times daily., Disp: 90 capsule, Rfl: 2   propranolol ER (INDERAL LA) 60 MG 24 hr capsule, TAKE ONE CAPSULE BY MOUTH EVERY DAY, Disp: 30 capsule, Rfl: 2   QUEtiapine (SEROQUEL) 50 MG tablet, TAKE ONE TABLET BY MOUTH AT BEDTIME, Disp: 30 tablet, Rfl: 2   ramelteon (ROZEREM) 8 MG tablet, Take 1 tablet (8 mg total) by mouth at bedtime., Disp: 30 tablet, Rfl: 11   rosuvastatin (CRESTOR) 5 MG tablet, TAKE ONE TABLET BY MOUTH AT BEDTIME, Disp: 30 tablet, Rfl: 2    sertraline (ZOLOFT) 100 MG tablet, TAKE ONE TABLET BY MOUTH AT BEDTIME, Disp: 30 tablet, Rfl: 2   tamsulosin (FLOMAX) 0.4 MG CAPS capsule, TAKE 2 CAPSULES BY MOUTH EVERY EVENING, Disp: 60 capsule, Rfl: 3   triamcinolone cream (KENALOG) 0.1 %, Apply 1 application. topically 2 (two) times daily as needed (to ears for itching)., Disp: 30 g, Rfl: 2   Vitamin D, Ergocalciferol, (DRISDOL) 1.25 MG (50000 UNIT) CAPS capsule, TAKE ONE CAPSULE BY MOUTH EVERY WEEK, Disp: 5 capsule, Rfl: 0  Cognitive Assessment Identity Confirmed: : Name; DOB Cognitive Status: Normal   Functional Assessment Hearing Difficulty or Deaf: no Wear Glasses or Blind: no Concentrating, Remembering or Making Decisions Difficulty (CP): yes Concentration Management: once in a while he forgets things but most of the time he does well Difficulty Communicating: no Difficulty Eating/Swallowing: no Walking or Climbing Stairs Difficulty: yes Walking or Climbing Stairs: ambulation difficulty, dependent (the patient is wheelchair bound, can stand and pivot, does not ambulate due to balance) Mobility Management: patient is wheelchair bound and the patient uses his wheelchair, he can stand but has issues with balance Dressing/Bathing Difficulty: yes Dressing/Bathing: bathing difficulty, assistance 1 person Dressing/Bathing Management: his son lives wth him and assist him with his bath, he helps wash his feet and back. Doing Errands Independently Difficulty (such as shopping) (CP): yes Errands Management: His son helps him with his errands and the things that he needs Change in Functional Status Since Onset of Current Illness/Injury: no   Caregiver Assessment  Primary Source of Support/Comfort: child(ren) Name of Support/Comfort Primary Source: has 4 children, his son lives with him, he has a good support from his family People in Home: child(ren), adult Name(s) of People in Home: son Family Caregiver if Needed: child(ren),  adult Primary Roles/Responsibilities: retired   Planned Interventions  Provider established cholesterol goals reviewed; Counseled on importance of regular laboratory monitoring as prescribed; Provided HLD Scientist, clinical (histocompatibility and immunogenetics); Reviewed role and benefits of statin for ASCVD risk reduction; Discussed strategies to manage statin-induced myalgias; Reviewed importance of limiting foods high in cholesterol; Screening for signs and symptoms of depression related to chronic disease state;  Assessed social determinant of health barriers;   Provided patient with basic written and verbal COPD education on self care/management/and exacerbation prevention Advised patient to track and manage COPD triggers. Review and education given. Provided written and verbal instructions on pursed lip breathing and utilized returned demonstration as teach back Provided instruction about proper use of medications used for management of COPD including inhalers. The patient uses inhalers and he says they help him a lot. He needs assistance with the cost of Breztri. Pharm D consult pending for assistance with medications Advised patient to self assesses COPD action plan zone and make  appointment with provider if in the yellow zone for 48 hours without improvement Provided education about and advised patient to utilize infection prevention strategies to reduce risk of respiratory infection. Education on being safe and monitoring for factors that may trigger exacerbations.  Discussed the importance of adequate rest and management of fatigue with COPD Screening for signs and symptoms of depression related to chronic disease state  Assessed social determinant of health barriers  Interaction and coordination with outside resources, practitioners, and providers See CCM Referral  Care Plan: Patient declined

## 2022-10-30 NOTE — Chronic Care Management (AMB) (Signed)
Chronic Care Management   CCM RN Visit Note  10/30/2022 Name: Shawn Osborn MRN: [id number] DOB: Apr 22, 1951  Subjective: Shawn Osborn is a 71 y.o. year old male who is a primary care patient of Cox, Kirsten, MD. The patient was referred to the Chronic Care Management team for assistance with care management needs subsequent to provider initiation of CCM services and plan of care.    Today's Visit:  Engaged with patient by telephone for initial visit.     SDOH Interventions Today    Flowsheet Row Most Recent Value  SDOH Interventions   Food Insecurity Interventions Intervention Not Indicated  Housing Interventions Intervention Not Indicated  Transportation Interventions Intervention Not Indicated  Utilities Interventions Intervention Not Indicated  Alcohol Usage Interventions Intervention Not Indicated (Score <7)  Financial Strain Interventions Other (Comment)  [pharm D referral for Breztri assistance]  Physical Activity Interventions Other (Comments)  [no physical activity, limited by his chronic conditions]  Stress Interventions Intervention Not Indicated  Social Connections Interventions Intervention Not Indicated, Other (Comment)  [his son in law is a Theme park manager and he goes to church when he can, states that he has good support from his family]         Goals Addressed             This Visit's Progress    CCM Expected Outcome:  Monitor, Self-Manage and Reduce Symptoms of: HLD       Current Barriers:  Chronic Disease Management support and education needs related to effective management of HLD  Planned Interventions: Provider established cholesterol goals reviewed; Counseled on importance of regular laboratory monitoring as prescribed; Provided HLD educational materials; Reviewed role and benefits of statin for ASCVD risk reduction; Discussed strategies to manage statin-induced myalgias; Reviewed importance of limiting foods high in cholesterol; Screening for  signs and symptoms of depression related to chronic disease state;  Assessed social determinant of health barriers;   Symptom Management: Take medications as prescribed   Attend all scheduled provider appointments Call provider office for new concerns or questions  call the Suicide and Crisis Lifeline: 988 call the [place] National Suicide Prevention Lifeline: (234)202-2952 or TTY: 862-692-6110 TTY 807 105 7222) to talk to a trained counselor call 1-800-273-TALK (toll free, 24 hour hotline) if experiencing a Mental Health or [place]  - call for medicine refill 2 or 3 days before it runs out - take all medications exactly as prescribed - call doctor with any symptoms you believe are related to your medicine - call doctor when you experience any new symptoms - go to all doctor appointments as scheduled - adhere to prescribed diet: heart healthy diet  Follow Up Plan: Telephone follow up appointment with care management team member scheduled for: 12-18-2022 at 230 pm       CCM:  Maintain, Monitor and Self-Manage Symptoms of COPD       Current Barriers:  Care Coordination needs related to support, education, and financial help for Breztri inhaler in a patient with COPD Chronic Disease Management support and education needs related to effective management of COPD Financial Constraints.   Planned Interventions: Provided patient with basic written and verbal COPD education on self care/management/and exacerbation prevention Advised patient to track and manage COPD triggers. Review and education given. Provided written and verbal instructions on pursed lip breathing and utilized returned demonstration as teach back Provided instruction about proper use of medications used for management of COPD including inhalers. The patient uses inhalers and he says they help him  a lot. He needs assistance with the cost of Breztri. Pharm D consult pending for assistance with medications Advised  patient to self assesses COPD action plan zone and make appointment with provider if in the yellow zone for 48 hours without improvement Provided education about and advised patient to utilize infection prevention strategies to reduce risk of respiratory infection. Education on being safe and monitoring for factors that may trigger exacerbations.  Discussed the importance of adequate rest and management of fatigue with COPD Screening for signs and symptoms of depression related to chronic disease state  Assessed social determinant of health barriers  Symptom Management: Take medications as prescribed   Attend all scheduled provider appointments Call provider office for new concerns or questions  call the Suicide and Crisis Lifeline: 988 call the [place] National Suicide Prevention Lifeline: 320 256 8842 or TTY: 609-578-8148 TTY 517-505-1901) to talk to a trained counselor call 1-800-273-TALK (toll free, 24 hour hotline) if experiencing a Mental Health or [place]  avoid second hand smoke eliminate smoking in my home identify and avoid work-related triggers identify and remove indoor air pollutants limit outdoor activity during cold weather listen for public air quality announcements every day do breathing exercises every day develop a rescue plan eliminate symptom triggers at home follow rescue plan if symptoms flare-up  Follow Up Plan: Telephone follow up appointment with care management team member scheduled for: 12-18-2022 at 230 pm          Plan:Telephone follow up appointment with care management team member scheduled for:  12-18-2022 at 230 pm  [place], MSN, CCM RN Care Manager  Chronic Care Management Direct Number: [phone]

## 2022-10-30 NOTE — Patient Instructions (Signed)
Please call the care guide team at [phone] if you need to cancel or reschedule your appointment.   If you are experiencing a Mental Health or [place] or need someone to talk to, please call the Suicide and Crisis Lifeline: 988 call the [place] National Suicide Prevention Lifeline: 9597681828 or TTY: 819-680-7515 TTY 929-182-6854) to talk to a trained counselor call 1-800-273-TALK (toll free, 24 hour hotline)   Following is a copy of your full provider care plan:   Goals Addressed             This Visit's Progress    CCM Expected Outcome:  Monitor, Self-Manage and Reduce Symptoms of: HLD       Current Barriers:  Chronic Disease Management support and education needs related to effective management of HLD  Planned Interventions: Provider established cholesterol goals reviewed; Counseled on importance of regular laboratory monitoring as prescribed; Provided HLD educational materials; Reviewed role and benefits of statin for ASCVD risk reduction; Discussed strategies to manage statin-induced myalgias; Reviewed importance of limiting foods high in cholesterol; Screening for signs and symptoms of depression related to chronic disease state;  Assessed social determinant of health barriers;   Symptom Management: Take medications as prescribed   Attend all scheduled provider appointments Call provider office for new concerns or questions  call the Suicide and Crisis Lifeline: 988 call the [place] National Suicide Prevention Lifeline: 580 772 2947 or TTY: (307)874-2826 TTY 773-297-8404) to talk to a trained counselor call 1-800-273-TALK (toll free, 24 hour hotline) if experiencing a Mental Health or [place]  - call for medicine refill 2 or 3 days before it runs out - take all medications exactly as prescribed - call doctor with any symptoms you believe are related to your medicine - call doctor when you experience any new symptoms - go to all  doctor appointments as scheduled - adhere to prescribed diet: heart healthy diet  Follow Up Plan: Telephone follow up appointment with care management team member scheduled for: 12-18-2022 at 230 pm       CCM:  Maintain, Monitor and Self-Manage Symptoms of COPD       Current Barriers:  Care Coordination needs related to support, education, and financial help for Breztri inhaler in a patient with COPD Chronic Disease Management support and education needs related to effective management of COPD Financial Constraints.   Planned Interventions: Provided patient with basic written and verbal COPD education on self care/management/and exacerbation prevention Advised patient to track and manage COPD triggers. Review and education given. Provided written and verbal instructions on pursed lip breathing and utilized returned demonstration as teach back Provided instruction about proper use of medications used for management of COPD including inhalers. The patient uses inhalers and he says they help him a lot. He needs assistance with the cost of Breztri. Pharm D consult pending for assistance with medications Advised patient to self assesses COPD action plan zone and make appointment with provider if in the yellow zone for 48 hours without improvement Provided education about and advised patient to utilize infection prevention strategies to reduce risk of respiratory infection. Education on being safe and monitoring for factors that may trigger exacerbations.  Discussed the importance of adequate rest and management of fatigue with COPD Screening for signs and symptoms of depression related to chronic disease state  Assessed social determinant of health barriers  Symptom Management: Take medications as prescribed   Attend all scheduled provider appointments Call provider office for new concerns or questions  call  the Suicide and Crisis Lifeline: 988 call the [place] National Suicide Prevention Lifeline:  (781)145-6550 or TTY: (219)544-8528 TTY 402 247 2767) to talk to a trained counselor call 1-800-273-TALK (toll free, 24 hour hotline) if experiencing a [place] or [place]  avoid second hand smoke eliminate smoking in my home identify and avoid work-related triggers identify and remove indoor air pollutants limit outdoor activity during cold weather listen for public air quality announcements every day do breathing exercises every day develop a rescue plan eliminate symptom triggers at home follow rescue plan if symptoms flare-up  Follow Up Plan: Telephone follow up appointment with care management team member scheduled for: 12-18-2022 at 230 pm          The patient verbalized understanding of instructions, educational materials, and care plan provided today and DECLINED offer to receive copy of patient instructions, educational materials, and care plan.   Telephone follow up appointment with care management team member scheduled for: 12-18-2022 at 230 pm

## 2022-10-31 ENCOUNTER — Telehealth: Payer: Self-pay

## 2022-10-31 NOTE — Progress Notes (Signed)
   Care Guide Note  10/31/2022 Name: Shawn Osborn MRN: [id number] DOB: [birth year]  Referred by: Rochel Brome, MD Reason for referral : Care Coordination (Outreach to schedule with Pharm D )   Shawn Osborn is a 71 y.o. year old male who is a primary care patient of Cox, Kirsten, MD. Shawn Osborn was referred to the pharmacist for assistance related to DM.    Successful contact was made with the patient to discuss pharmacy services including being ready for the pharmacist to call at least 5 minutes before the scheduled appointment time, to have medication bottles and any blood sugar or blood pressure readings ready for review. The patient agreed to meet with the pharmacist via with the pharmacist via telephone visit on (date/time).  11/02/2022  Noreene Larsson, [place], [place] [address] Direct Dial: [phone] [email or web address]

## 2022-11-02 ENCOUNTER — Telehealth: Payer: Self-pay

## 2022-11-02 ENCOUNTER — Other Ambulatory Visit (HOSPITAL_COMMUNITY): Payer: Self-pay

## 2022-11-02 ENCOUNTER — Other Ambulatory Visit: Payer: Medicare Other | Admitting: Pharmacist

## 2022-11-02 NOTE — Telephone Encounter (Signed)
SPOKE to daughter about cost and getting PAP for [facility], I informed her that I would me mailing out [place] application to pt home.

## 2022-11-02 NOTE — Patient Instructions (Signed)
Goals Addressed             This Visit's Progress    Pharmacy Goals       Reminders for using your Breztri inhaler:  Please remember to prime your inhaler before using it for the first time. To do this, remove the cap from the mouthpiece, hold it upright, facing away from you, and shake well. Push the canister all the way down until it stops moving in the actuator to release a puff of medicine from the mouthpiece. You may hear a soft click from the dose indicator as it counts down. Repeat this process three times more.  To use the inhaler, take the cap off and shake the primed inhaler. Exhale as much as you reasonably can through your mouth, then close your lips around the mouthpiece and tilt your head back, keeping your tongue below the mouthpiece. Inhale deeply and slowly while pressing down on the canister until it stops moving. You should feel a puff of medicine. Remove the inhaler while holding your breath for as long as you comfortably can, up to 10 seconds, before breathing out gently. Repeat this process once more and replace the cap. You should be taking 2 puffs, 2 times a day. After your second puff, rinse your mouth with water to remove any excess medicine, making sure you don't swallow the water.  Please watch the mail for an envelope from [employer] containing the patient assistance program application. Please complete this application and mail back to [facility] along with a copy of your proof of income document.  Thank you,  Wallace Cullens, PharmD, [facility] [phone]

## 2022-11-02 NOTE — Chronic Care Management (AMB) (Signed)
11/02/2022 Name: Shawn Osborn MRN: [id number] DOB: 1951/08/17  Chief Complaint  Patient presents with   Medication Assistance    Shawn Osborn is a 71 y.o. year old male who presented for a telephone visit.   They were referred to the pharmacist by their PCP for assistance in managing medication access.    Subjective:  Care Team: Primary Care Provider: Rochel Brome, MD ; Next Scheduled Visit: 01/22/2023  Medication Access/Adherence  Current Pharmacy:  [facility], [place] [address] Phone: [phone] Fax: [phone]   Patient reports affordability concerns with their medications: Yes  Patient reports access/transportation concerns to their pharmacy: No  Patient reports adherence concerns with their medications:  No    Patient and caregiver decline to review medication with Clinical Pharmacist today  COPD/Medication Assistance:  Current medications: - Breztri inhaler 2 puffs into the lungs in the morning and at bedtime (reports currently taking from sample provided by PCP) - albuterol HFA 2 puffs every 6 hours as needed for wheezing/shortness of breath  Medications tried in the past: Stiolto (cost)  Current medication access support: none - Patient reports that he is in the coverage gap of his BCBS Medicare Part D coverage and cost of inhaler is unaffordable. Denies difficulty with affording the rest of his medications Today patient asks that I contact his daughter [name] (listed on DPR in chart) to discuss his medication assistance needs/applying for assistance  Objective:  Lab Results  Component Value Date   CREATININE 0.70 (L) 10/17/2022   BUN 19 10/17/2022   NA 145 (H) 10/17/2022   K 5.1 10/17/2022   CL 104 10/17/2022   CO2 26 10/17/2022    Lab Results  Component Value Date   CHOL 139 10/17/2022   HDL 54 10/17/2022   LDLCALC 63 10/17/2022   TRIG 127 10/17/2022   CHOLHDL 2.6 10/17/2022     Medications    Reviewed by Vanita Ingles, RN (Case Manager) on 10/30/22 at [facility] List Status: <None>   Medication Order Taking? Sig Documenting Provider Last Dose Status Informant  albuterol (PROVENTIL) (2.5 MG/3ML) 0.083% nebulizer solution [id number] No Take 3 mLs (2.5 mg total) by nebulization every 6 (six) hours as needed for wheezing or shortness of breath. Cox, Kirsten, MD Taking Active   albuterol (VENTOLIN HFA) 108 [phone] Base) MCG/ACT inhaler [id number] No Inhale 2 puffs into the lungs every 6 (six) hours as needed for wheezing or shortness of breath. Cox, Kirsten, MD Taking Active   allopurinol (ZYLOPRIM) 300 MG tablet [id number] No TAKE ONE TABLET BY MOUTH EVERY DAY at Lorelei Pont, MD Taking Active   Budeson-Glycopyrrol-Formoterol (BREZTRI AEROSPHERE) 160-9-4.8 MCG/ACT Hollie Salk [id number]  Inhale 2 puffs into the lungs in the morning and at bedtime. Cox, Kirsten, MD  Active   buprenorphine-naloxone (SUBOXONE) 8-2 mg SUBL SL tablet [id number] No Place 1 tablet under the tongue daily. Taking 2.5 daukt [provider] Taking Active   donepezil (ARICEPT) 10 MG tablet [id number] No Take 1 tablet (10 mg total) by mouth daily. Cox, Elnita Maxwell, MD Taking Active   ferrous sulfate 324 MG TBEC [id number] No Take 324 mg by mouth daily with breakfast. Take 1 every other day [provider] Taking Active   furosemide (LASIX) 40 MG tablet [id number] No Take 1 tablet (40 mg total) by mouth 2 (two) times daily. Cox, Kirsten, MD Taking Active   hydrocortisone 2.5%-Eucerin equivalent 1:1 cream mixture [id number] No Apply  topically 2 (two) times daily. Cox, Kirsten, MD Taking Active   hydrOXYzine (ATARAX) 50 MG tablet [id number] No TAKE ONE TABLET BY MOUTH AT BEDTIME Cox, Kirsten, MD Taking Active   ipratropium-albuterol (DUONEB) 0.5-2.5 (3) MG/3ML SOLN [id number]  Take 3 mLs by nebulization every 4 (four) hours as needed. Cox, Kirsten, MD  Active   levofloxacin (LEVAQUIN) 750 MG tablet [id number]   Take 1 tablet (750 mg total) by mouth daily. Cox, Kirsten, MD  Active   memantine (NAMENDA XR) 28 MG CP24 24 hr capsule [id number] No TAKE ONE CAPSULE BY MOUTH AT BEDTIME Cox, Kirsten, MD Taking Active   omega-3 acid ethyl esters (LOVAZA) 1 g capsule [id number] No TAKE 2 CAPSULES BY MOUTH TWICE DAILY Cox, Kirsten, MD Taking Active   omeprazole (PRILOSEC) 40 MG capsule [id number] No TAKE ONE CAPSULE BY MOUTH EVERY DAY Cox, Kirsten, MD Taking Active   pregabalin (LYRICA) 50 MG capsule [id number] No Take 1 capsule (50 mg total) by mouth 3 (three) times daily. Cox, Kirsten, MD Taking Active   propranolol ER (INDERAL LA) 60 MG 24 hr capsule [id number] No TAKE ONE CAPSULE BY MOUTH EVERY DAY Cox, Kirsten, MD Taking Active   QUEtiapine (SEROQUEL) 50 MG tablet [id number] No TAKE ONE TABLET BY MOUTH AT BEDTIME Cox, Kirsten, MD Taking Active   ramelteon (ROZEREM) 8 MG tablet [id number] No Take 1 tablet (8 mg total) by mouth at bedtime. Cox, Kirsten, MD Taking Active   rosuvastatin (CRESTOR) 5 MG tablet [id number] No TAKE ONE TABLET BY MOUTH AT BEDTIME Cox, Kirsten, MD Taking Active   sertraline (ZOLOFT) 100 MG tablet [id number] No TAKE ONE TABLET BY MOUTH AT BEDTIME Cox, Kirsten, MD Taking Active   tamsulosin (FLOMAX) 0.4 MG CAPS capsule [id number] No TAKE 2 CAPSULES BY MOUTH EVERY EVENING Cox, Kirsten, MD Taking Active   triamcinolone cream (KENALOG) 0.1 % [id number] No Apply 1 application. topically 2 (two) times daily as needed (to ears for itching). Cox, Kirsten, MD Taking Active   Vitamin D, Ergocalciferol, (DRISDOL) 1.25 MG (50000 UNIT) CAPS capsule [id number] No TAKE ONE CAPSULE BY MOUTH EVERY WEEK Cox, Kirsten, MD Taking Active               Assessment/Plan:   Request to review medication with patient/caregiver today, but patient/caregiver decline  COPD/Medication Assistance: - Reviewed appropriate inhaler technique. - Recommend to rinse mouth and spit out after each use of Breztri  - Patient  requesting assistance with cost of his Breztri inhaler as he is in coverage gap of his BCBS Medicare Part D coverage and cost of inhaler is unaffordable Based on reported income from patient/daughter, patient meets criteria for [employer] patient assistance program through [employer] Will collaborate with [facility] team for assistance to patient with applying for this assistance   Follow Up Plan: Clinical Pharmacist will outreach to patient/daughter by telephone again on 12/03/2022 at 11:30 am  Wallace Cullens, PharmD, [facility] [phone]

## 2022-11-06 NOTE — Addendum Note (Signed)
Addended byRochel Brome on: 11/06/2022 09:29 PM   Modules accepted: Level of Service

## 2022-11-07 ENCOUNTER — Other Ambulatory Visit: Payer: Self-pay | Admitting: Family Medicine

## 2022-11-18 DIAGNOSIS — E785 Hyperlipidemia, unspecified: Secondary | ICD-10-CM

## 2022-11-18 DIAGNOSIS — J449 Chronic obstructive pulmonary disease, unspecified: Secondary | ICD-10-CM

## 2022-11-23 ENCOUNTER — Other Ambulatory Visit: Payer: Self-pay | Admitting: Family Medicine

## 2022-11-30 ENCOUNTER — Other Ambulatory Visit: Payer: Medicare Other | Admitting: Pharmacist

## 2022-12-03 ENCOUNTER — Other Ambulatory Visit: Payer: Medicare Other | Admitting: Pharmacist

## 2022-12-03 NOTE — Patient Instructions (Signed)
Goals Addressed             This Visit's Progress    Pharmacy Goals       Reminders for using your Breztri inhaler:  Please remember to prime your inhaler before using it for the first time. To do this, remove the cap from the mouthpiece, hold it upright, facing away from you, and shake well. Push the canister all the way down until it stops moving in the actuator to release a puff of medicine from the mouthpiece. You may hear a soft click from the dose indicator as it counts down. Repeat this process three times more.  To use the inhaler, take the cap off and shake the primed inhaler. Exhale as much as you reasonably can through your mouth, then close your lips around the mouthpiece and tilt your head back, keeping your tongue below the mouthpiece. Inhale deeply and slowly while pressing down on the canister until it stops moving. You should feel a puff of medicine. Remove the inhaler while holding your breath for as long as you comfortably can, up to 10 seconds, before breathing out gently. Repeat this process once more and replace the cap. You should be taking 2 puffs, 2 times a day. After your second puff, rinse your mouth with water to remove any excess medicine, making sure you don't swallow the water.   Thank you,  Wallace Cullens, PharmD, [facility] [phone]

## 2022-12-03 NOTE — Progress Notes (Signed)
12/03/2022 Name: Shawn Osborn MRN: [id number] DOB: 1951/05/07  Chief Complaint  Patient presents with   Medication Assistance    Shawn Osborn is a 72 y.o. year old male who was referred to the pharmacist by their PCP for assistance in managing medication access.   Follow up with daughter/caregiver, Dyanne Iha, by telephone today as requested.   Subjective:  Care Team: Primary Care Provider: Rochel Brome, MD ; Next Scheduled Visit: 01/22/2023  Medication Access/Adherence  Current Pharmacy:  [facility], [address] [address] Phone: [phone] Fax: [phone]   Patient reports affordability concerns with their medications: Yes  Patient reports access/transportation concerns to their pharmacy: No  Patient reports adherence concerns with their medications:  No      COPD/Medication Assistance:   Current medications: - Breztri inhaler 2 puffs into the lungs in the morning and at bedtime  - albuterol HFA 2 puffs every 6 hours as needed for wheezing/shortness of breath   Medications tried in the past: Mining engineer (cost)   Current medication access support: none - Collaborating with [place] team for aid to patient with applying for [facility] patient assistance from [employer] From review of chart, note CPhT Anette Riedel mailed assistance program application to patient on 11/02/2022 Today follow up with daughter/caregiver who states she/patient received and completed application and will bring this to PCP office to be faxed back to CPhT today    Objective:   Lab Results  Component Value Date   CREATININE 0.70 (L) 10/17/2022   BUN 19 10/17/2022   NA 145 (H) 10/17/2022   K 5.1 10/17/2022   CL 104 10/17/2022   CO2 26 10/17/2022    Lab Results  Component Value Date   CHOL 139 10/17/2022   HDL 54 10/17/2022   LDLCALC 63 10/17/2022   TRIG 127 10/17/2022   CHOLHDL 2.6 10/17/2022     Medications Reviewed Today     Reviewed by Vanita Ingles, RN (Case Manager) on 10/30/22 at [facility] List Status: <None>   Medication Order Taking? Sig Documenting Provider Last Dose Status Informant  albuterol (PROVENTIL) (2.5 MG/3ML) 0.083% nebulizer solution [id number] No Take 3 mLs (2.5 mg total) by nebulization every 6 (six) hours as needed for wheezing or shortness of breath. Cox, Kirsten, MD Taking Active   albuterol (VENTOLIN HFA) 108 [phone] Base) MCG/ACT inhaler [id number] No Inhale 2 puffs into the lungs every 6 (six) hours as needed for wheezing or shortness of breath. Cox, Kirsten, MD Taking Active   allopurinol (ZYLOPRIM) 300 MG tablet [id number] No TAKE ONE TABLET BY MOUTH EVERY DAY at Lorelei Pont, MD Taking Active   Budeson-Glycopyrrol-Formoterol (BREZTRI AEROSPHERE) 160-9-4.8 MCG/ACT Hollie Salk [id number]  Inhale 2 puffs into the lungs in the morning and at bedtime. Cox, Kirsten, MD  Active   buprenorphine-naloxone (SUBOXONE) 8-2 mg SUBL SL tablet [id number] No Place 1 tablet under the tongue daily. Taking 2.5 daukt [provider] Taking Active   donepezil (ARICEPT) 10 MG tablet [id number] No Take 1 tablet (10 mg total) by mouth daily. Cox, Elnita Maxwell, MD Taking Active   ferrous sulfate 324 MG TBEC [id number] No Take 324 mg by mouth daily with breakfast. Take 1 every other day [provider] Taking Active   furosemide (LASIX) 40 MG tablet [id number] No Take 1 tablet (40 mg total) by mouth 2 (two) times daily. Cox, Kirsten, MD Taking Active   hydrocortisone 2.5%-Eucerin equivalent 1:1  cream mixture [id number] No Apply topically 2 (two) times daily. Cox, Kirsten, MD Taking Active   hydrOXYzine (ATARAX) 50 MG tablet [id number] No TAKE ONE TABLET BY MOUTH AT BEDTIME Cox, Kirsten, MD Taking Active   ipratropium-albuterol (DUONEB) 0.5-2.5 (3) MG/3ML SOLN [id number]  Take 3 mLs by nebulization every 4 (four) hours as needed. Cox, Kirsten, MD  Active   levofloxacin (LEVAQUIN) 750 MG  tablet [id number]  Take 1 tablet (750 mg total) by mouth daily. Cox, Kirsten, MD  Active   memantine (NAMENDA XR) 28 MG CP24 24 hr capsule [id number] No TAKE ONE CAPSULE BY MOUTH AT BEDTIME Cox, Kirsten, MD Taking Active   omega-3 acid ethyl esters (LOVAZA) 1 g capsule [id number] No TAKE 2 CAPSULES BY MOUTH TWICE DAILY Cox, Kirsten, MD Taking Active   omeprazole (PRILOSEC) 40 MG capsule [id number] No TAKE ONE CAPSULE BY MOUTH EVERY DAY Cox, Kirsten, MD Taking Active   pregabalin (LYRICA) 50 MG capsule [id number] No Take 1 capsule (50 mg total) by mouth 3 (three) times daily. Cox, Kirsten, MD Taking Active   propranolol ER (INDERAL LA) 60 MG 24 hr capsule [id number] No TAKE ONE CAPSULE BY MOUTH EVERY DAY Cox, Kirsten, MD Taking Active   QUEtiapine (SEROQUEL) 50 MG tablet [id number] No TAKE ONE TABLET BY MOUTH AT BEDTIME Cox, Kirsten, MD Taking Active   ramelteon (ROZEREM) 8 MG tablet [id number] No Take 1 tablet (8 mg total) by mouth at bedtime. Cox, Kirsten, MD Taking Active   rosuvastatin (CRESTOR) 5 MG tablet [id number] No TAKE ONE TABLET BY MOUTH AT BEDTIME Cox, Kirsten, MD Taking Active   sertraline (ZOLOFT) 100 MG tablet [id number] No TAKE ONE TABLET BY MOUTH AT BEDTIME Cox, Kirsten, MD Taking Active   tamsulosin (FLOMAX) 0.4 MG CAPS capsule [id number] No TAKE 2 CAPSULES BY MOUTH EVERY EVENING Cox, Kirsten, MD Taking Active   triamcinolone cream (KENALOG) 0.1 % [id number] No Apply 1 application. topically 2 (two) times daily as needed (to ears for itching). Cox, Kirsten, MD Taking Active   Vitamin D, Ergocalciferol, (DRISDOL) 1.25 MG (50000 UNIT) CAPS capsule [id number] No TAKE ONE CAPSULE BY MOUTH EVERY WEEK Cox, Kirsten, MD Taking Active               Assessment/Plan:   Patient/daughter have declined to review medications with clinical pharmacist  COPD/Medication Assistance: - Reviewed importance of using maintenance inhaler Judithann Sauger) twice daily as directed, including rinsing mouth and spit out  after each use of Breztri  - Collaborating with [facility] team for assistance to patient with applying for this assistance Today again provide daughter/caregiver with fax number for faxing completed application back to [place] - Will send message to PCP to request provider send in a new Rx for Breztri to patient's local pharmacy for patient to use while applying for supply from assistance program     Follow Up Plan: Clinical Pharmacist will outreach to patient/daughter by telephone again in February to follow up regarding medication assistance   Wallace Cullens, PharmD, [facility] [phone]

## 2022-12-18 ENCOUNTER — Telehealth: Payer: Self-pay

## 2022-12-18 ENCOUNTER — Telehealth: Payer: Medicare Other

## 2022-12-18 NOTE — Telephone Encounter (Signed)
   CCM RN Visit Note   12-18-2022 Name: SERENITY FORTNER MRN: [id number]      DOB: June 20, 1951  Subjective: KAMANI LEWTER is a 72 y.o. year old male who is a primary care patient of Dr. Rochel Brome. The patient was referred to the Chronic Care Management team for assistance with care management needs subsequent to provider initiation of CCM services and plan of care.      An unsuccessful telephone outreach was attempted today to contact the patient about Chronic Care Management needs.    Plan:The care management team will reach out to the patient again over the next 30 days.  Noreene Larsson RN, MSN, CCM RN Care Manager  Chronic Care Management Direct Number: [phone]

## 2022-12-19 NOTE — Progress Notes (Unsigned)
Acute Office Visit  Subjective:    Patient ID: Shawn Osborn, male    DOB: 06/19/1951, 72 y.o.   MRN: [id number]  No chief complaint on file.   HPI: Patient is in today for laceration on toe.  Past Medical History:  Diagnosis Date   Acute combined systolic and diastolic CHF, NYHA class 3 (HCC) 10/08/2019   Acute delirium 10/08/2019   Acute exacerbation of CHF (congestive heart failure) ([place]) 10/08/2019   Acute gout of multiple sites 07/01/2016   Acute respiratory failure with hypoxia and hypercapnia (HCC) 10/08/2019   Allergy    Anxiety    Arthritis    Gout, osteoarthritis in Back and Knees   Auditory hallucinations 05/01/2022   Cataract    CHF (congestive heart failure) (HCC)    Chronic bronchitis    Community acquired pneumonia 10/08/2019   COPD (chronic obstructive pulmonary disease) ([place]) 10/08/2019   Dementia ([place]) 10/08/2019   Elevated troponin 10/08/2019   Emphysema of lung ([place])    Essential hypertension 07/03/2016   GERD (gastroesophageal reflux disease)    History of kidney stones    HLD (hyperlipidemia) 10/08/2019   Hypercapnic respiratory failure ([place]) 10/08/2019   Hyperlipidemia    Hypertension    Hypertensive heart disease 10/08/2019   Morbid obesity due to excess calories ([place]) 10/08/2019   Narcotic overdose ([place]) 10/08/2019   Neuropathy    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   NSTEMI (non-ST elevated myocardial infarction) ([place]) 10/08/2019   Obstructive sleep apnea    Occlusion and stenosis of carotid artery without mention of cerebral infarction 12/13/2011   OSA on CPAP 10/08/2019   Pain in joint of left shoulder 01/12/2019   Paranoia (psychosis) ([place]) 07/01/2016   Poorly-controlled hypertension 10/08/2019   Prediabetes    Psychotic disorder with delusions ([place]) 07/01/2016   Respiratory failure with hypoxia and hypercapnia ([place]) 10/08/2019   Restless leg syndrome 07/03/2016   Rheumatoid arthritis(714.0)    RLS (restless legs syndrome)     Sepsis ([place]) 10/08/2019   Sleep apnea    Spinal stenosis    Syncope 04/03/2019   Tear of left rotator cuff 07/16/33   Toxic metabolic encephalopathy 91/79/1505   UTI (urinary tract infection) 10/08/2019   Vascular dementia with behavioral disturbance ([place]) 07/06/2016   Visual hallucinations 05/01/2022    Past Surgical History:  Procedure Laterality Date   APPENDECTOMY     CARPAL TUNNEL RELEASE     EYE SURGERY     INTRAVASCULAR PRESSURE WIRE/FFR STUDY N/A 10/20/2019   Procedure: INTRAVASCULAR PRESSURE WIRE/FFR STUDY;  Surgeon: Belva Crome, MD;  Location: [facility];  Service: Cardiovascular;  Laterality: N/A;   LEFT HEART CATH AND CORONARY ANGIOGRAPHY N/A 10/20/2019   Procedure: LEFT HEART CATH AND CORONARY ANGIOGRAPHY;  Surgeon: Belva Crome, MD;  Location: [facility];  Service: Cardiovascular;  Laterality: N/A;   [place], 2003, 2012   Laminectomy X 3    Family History  Problem Relation Age of Onset   Heart disease Mother    Hypertension Mother     Social History   Socioeconomic History   Marital status: Widowed    Spouse name: Not on file   Number of children: 4   Years of education: Not on file   Highest education level: Not on file  Occupational History   Not on file  Tobacco Use   Smoking status: Every Day    Packs/day: 0.75    Years: 53.00  Total pack years: 39.75    Types: Cigarettes   Smokeless tobacco: Current    Types: Chew  Vaping Use   Vaping Use: Never used  Substance and Sexual Activity   Alcohol use: No   Drug use: No   Sexual activity: Not Currently  Other Topics Concern   Not on file  Social History Narrative   Not on file   Social Determinants of Health   Financial Resource Strain: Low Risk  (10/30/2022)   Overall Financial Resource Strain (CARDIA)    Difficulty of Paying Living Expenses: Not very hard  Food Insecurity: No Food Insecurity (10/30/2022)   Hunger Vital Sign    Worried About Running Out  of Food in the Last Year: Never true    Ran Out of Food in the Last Year: Never true  Transportation Needs: No Transportation Needs (10/30/2022)   PRAPARE - Hydrologist (Medical): No    Lack of Transportation (Non-Medical): No  Physical Activity: Inactive (10/30/2022)   Exercise Vital Sign    Days of Exercise per Week: 0 days    Minutes of Exercise per Session: 0 min  Stress: No Stress Concern Present (10/30/2022)   [place]    Feeling of Stress : Not at all  Social Connections: Moderately Isolated (10/30/2022)   Social Connection and Isolation Panel [NHANES]    Frequency of Communication with Friends and Family: More than three times a week    Frequency of Social Gatherings with Friends and Family: More than three times a week    Attends Religious Services: More than 4 times per year    Active Member of [employer] or Organizations: No    Attends Archivist Meetings: Never    Marital Status: Widowed  Intimate Partner Violence: Not At Risk (10/30/2022)   Humiliation, Afraid, Rape, and Kick questionnaire    Fear of Current or Ex-Partner: No    Emotionally Abused: No    Physically Abused: No    Sexually Abused: No    Outpatient Medications Prior to Visit  Medication Sig Dispense Refill   albuterol (PROVENTIL) (2.5 MG/3ML) 0.083% nebulizer solution Take 3 mLs (2.5 mg total) by nebulization every 6 (six) hours as needed for wheezing or shortness of breath. 120 mL 12   albuterol (VENTOLIN HFA) 108 (90 Base) MCG/ACT inhaler Inhale 2 puffs into the lungs every 6 (six) hours as needed for wheezing or shortness of breath. 8 g 3   allopurinol (ZYLOPRIM) 300 MG tablet TAKE ONE TABLET BY MOUTH EVERY DAY at 1pm 30 tablet 2   Budeson-Glycopyrrol-Formoterol (BREZTRI AEROSPHERE) 160-9-4.8 MCG/ACT AERO Inhale 2 puffs into the lungs in the morning and at bedtime. 1 each 0   buprenorphine-naloxone  (SUBOXONE) 8-2 mg SUBL SL tablet Place 1 tablet under the tongue daily. Taking 2.5 daukt     donepezil (ARICEPT) 10 MG tablet TAKE ONE TABLET BY MOUTH EVERY EVENING 90 tablet 3   ferrous sulfate 324 MG TBEC Take 324 mg by mouth daily with breakfast. Take 1 every other day     furosemide (LASIX) 40 MG tablet Take 1 tablet (40 mg total) by mouth 2 (two) times daily. 60 tablet 1   hydrocortisone 2.5%-Eucerin equivalent 1:1 cream mixture Apply topically 2 (two) times daily. 120 g 0   hydrOXYzine (ATARAX) 50 MG tablet TAKE ONE TABLET BY MOUTH AT BEDTIME 30 tablet 2   ipratropium-albuterol (DUONEB) 0.5-2.5 (3) MG/3ML SOLN Take 3 mLs  by nebulization every 4 (four) hours as needed. 360 mL 2   levofloxacin (LEVAQUIN) 750 MG tablet Take 1 tablet (750 mg total) by mouth daily. 7 tablet 0   memantine (NAMENDA XR) 28 MG CP24 24 hr capsule TAKE ONE CAPSULE BY MOUTH AT BEDTIME 90 capsule 1   omega-3 acid ethyl esters (LOVAZA) 1 g capsule TAKE 2 CAPSULES BY MOUTH TWICE DAILY 360 capsule 0   omeprazole (PRILOSEC) 40 MG capsule TAKE ONE CAPSULE BY MOUTH EVERY DAY 30 capsule 2   pregabalin (LYRICA) 50 MG capsule Take 1 capsule (50 mg total) by mouth 3 (three) times daily. 90 capsule 2   propranolol ER (INDERAL LA) 60 MG 24 hr capsule TAKE ONE CAPSULE BY MOUTH EVERY DAY 30 capsule 2   QUEtiapine (SEROQUEL) 50 MG tablet TAKE ONE TABLET BY MOUTH AT BEDTIME 30 tablet 2   ramelteon (ROZEREM) 8 MG tablet TAKE ONE TABLET BY MOUTH AT BEDTIME 30 tablet 11   rosuvastatin (CRESTOR) 5 MG tablet TAKE ONE TABLET BY MOUTH AT BEDTIME 30 tablet 2   sertraline (ZOLOFT) 100 MG tablet TAKE ONE TABLET BY MOUTH AT BEDTIME 30 tablet 2   tamsulosin (FLOMAX) 0.4 MG CAPS capsule TAKE 2 CAPSULES BY MOUTH EVERY EVENING 60 capsule 3   triamcinolone cream (KENALOG) 0.1 % Apply 1 application. topically 2 (two) times daily as needed (to ears for itching). 30 g 2   Vitamin D, Ergocalciferol, (DRISDOL) 1.25 MG (50000 UNIT) CAPS capsule TAKE ONE  CAPSULE BY MOUTH EVERY WEEK 15 capsule 3   No facility-administered medications prior to visit.    Allergies  Allergen Reactions   Avelox [Moxifloxacin Hcl In Nacl] Swelling   Codeine Phosphate Itching   Cozaar     Unknown    Cymbalta [Duloxetine Hcl]     Twitching   Ropinirole     Weakness, dizziness, felt sick   Statins     myalgia   Welchol [Colesevelam Hcl]    Penicillins Rash    Did it involve swelling of the face/tongue/throat, SOB, or low BP? No Did it involve sudden or severe rash/hives, skin peeling, or any reaction on the inside of your mouth or nose? Yes Did you need to seek medical attention at a hospital or doctor's office? Yes When did it last happen?      15 + years If all above answers are "NO", may proceed with cephalosporin use.     Review of Systems     Objective:        10/17/2022   10:47 AM 07/03/2022   10:24 AM 06/12/2022    9:44 AM  Vitals with BMI  Height '5\' 10"'$  '5\' 11"'$  '5\' 11"'$   Weight 287 lbs 278 lbs 270 lbs  BMI 41.18 86.76 72.09  Systolic 470 962 836  Diastolic 64 58 68  Pulse 72 61 60    No data found.   Physical Exam  Health Maintenance Due  Topic Date Due   Hepatitis C Screening  Never done   DTaP/Tdap/Td (1 - Tdap) Never done   Zoster Vaccines- Shingrix (1 of 2) Never done   COLONOSCOPY (Pts 45-12yr Insurance coverage will need to be confirmed)  09/16/2015   Lung Cancer Screening  08/31/2021    There are no preventive care reminders to display for this patient.   Lab Results  Component Value Date   TSH 3.200 05/01/2022   Lab Results  Component Value Date   WBC 11.3 (H) 10/17/2022   HGB 12.5 (L)  10/17/2022   HCT 39.3 10/17/2022   MCV 86 10/17/2022   PLT 255 10/17/2022   Lab Results  Component Value Date   NA 145 (H) 10/17/2022   K 5.1 10/17/2022   CO2 26 10/17/2022   GLUCOSE 89 10/17/2022   BUN 19 10/17/2022   CREATININE 0.70 (L) 10/17/2022   BILITOT <0.2 10/17/2022   ALKPHOS 83 10/17/2022   AST 16  10/17/2022   ALT 10 10/17/2022   PROT 6.6 10/17/2022   ALBUMIN 3.9 10/17/2022   CALCIUM 8.9 10/17/2022   ANIONGAP 11 09/01/2020   EGFR 98 10/17/2022   Lab Results  Component Value Date   CHOL 139 10/17/2022   Lab Results  Component Value Date   HDL 54 10/17/2022   Lab Results  Component Value Date   LDLCALC 63 10/17/2022   Lab Results  Component Value Date   TRIG 127 10/17/2022   Lab Results  Component Value Date   CHOLHDL 2.6 10/17/2022   Lab Results  Component Value Date   HGBA1C 5.7 (H) 07/03/2022       Assessment & Plan:  No problem-specific Assessment & Plan notes found for this encounter.    No orders of the defined types were placed in this encounter.   No orders of the defined types were placed in this encounter.    Follow-up: No follow-ups on file.  An After Visit Summary was printed and given to the patient.  Rochel Brome, MD [facility] [phone]

## 2022-12-20 ENCOUNTER — Encounter: Payer: Medicare Other | Admitting: Family Medicine

## 2022-12-20 ENCOUNTER — Telehealth: Payer: Self-pay

## 2022-12-20 NOTE — Progress Notes (Signed)
  Chronic Care Management Note  12/20/2022 Name: JERROL HELMERS MRN: [id number] DOB: 02/07/1951  OLUSEGUN GERSTENBERGER is a 72 y.o. year old male who is a primary care patient of Cox, Kirsten, MD and is actively engaged with the Chronic Care Management team. I reached out to [name] by phone today to assist with re-scheduling a follow up visit with the RN Case Manager  Follow up plan: Unsuccessful telephone outreach attempt made. A HIPAA compliant phone message was left for the patient providing contact information and requesting a return call.  The care management team will reach out to the patient again over the next 7 days.  If patient returns call to provider office, please advise to call [facility]  at [place], [place], [place] [address] Direct Dial: [phone] [email or web address]

## 2022-12-23 NOTE — Progress Notes (Signed)
This encounter was created in error - please disregard.

## 2022-12-25 ENCOUNTER — Other Ambulatory Visit: Payer: Self-pay | Admitting: Family Medicine

## 2022-12-25 DIAGNOSIS — R351 Nocturia: Secondary | ICD-10-CM

## 2022-12-25 DIAGNOSIS — M1A09X0 Idiopathic chronic gout, multiple sites, without tophus (tophi): Secondary | ICD-10-CM

## 2022-12-26 ENCOUNTER — Other Ambulatory Visit: Payer: Self-pay | Admitting: Family Medicine

## 2022-12-27 NOTE — Progress Notes (Signed)
  Chronic Care Management Note  12/27/2022 Name: JAMORION GOMILLION MRN: [id number] DOB: [birth year]  JARIS KOHLES is a 72 y.o. year old male who is a primary care patient of Cox, Kirsten, MD and is actively engaged with the Chronic Care Management team. I reached out to [name] by phone today to assist with re-scheduling an initial visit with the RN Case Manager  Follow up plan: Unsuccessful telephone outreach attempt made. A HIPAA compliant phone message was left for the patient providing contact information and requesting a return call.  The care management team will reach out to the patient again over the next 7 days.  If patient returns call to provider office, please advise to call [facility]  at [place], [place], [place] [address] Direct Dial: [phone] [email or web address]

## 2022-12-28 ENCOUNTER — Encounter: Payer: Self-pay | Admitting: Family Medicine

## 2022-12-28 ENCOUNTER — Ambulatory Visit (INDEPENDENT_AMBULATORY_CARE_PROVIDER_SITE_OTHER): Payer: Medicare Other | Admitting: Family Medicine

## 2022-12-28 ENCOUNTER — Other Ambulatory Visit: Payer: Self-pay | Admitting: Family Medicine

## 2022-12-28 VITALS — BP 158/62 | HR 57 | Temp 97.0°F | Ht 70.0 in | Wt 297.0 lb

## 2022-12-28 DIAGNOSIS — S81801A Unspecified open wound, right lower leg, initial encounter: Secondary | ICD-10-CM | POA: Diagnosis not present

## 2022-12-28 DIAGNOSIS — R7303 Prediabetes: Secondary | ICD-10-CM

## 2022-12-28 DIAGNOSIS — E782 Mixed hyperlipidemia: Secondary | ICD-10-CM | POA: Diagnosis not present

## 2022-12-28 DIAGNOSIS — G4733 Obstructive sleep apnea (adult) (pediatric): Secondary | ICD-10-CM

## 2022-12-28 DIAGNOSIS — G894 Chronic pain syndrome: Secondary | ICD-10-CM

## 2022-12-28 DIAGNOSIS — I110 Hypertensive heart disease with heart failure: Secondary | ICD-10-CM | POA: Diagnosis not present

## 2022-12-28 DIAGNOSIS — I5042 Chronic combined systolic (congestive) and diastolic (congestive) heart failure: Secondary | ICD-10-CM

## 2022-12-28 DIAGNOSIS — G629 Polyneuropathy, unspecified: Secondary | ICD-10-CM

## 2022-12-28 DIAGNOSIS — G300 Alzheimer's disease with early onset: Secondary | ICD-10-CM

## 2022-12-28 DIAGNOSIS — S98142A Partial traumatic amputation of one left lesser toe, initial encounter: Secondary | ICD-10-CM | POA: Diagnosis not present

## 2022-12-28 DIAGNOSIS — F02B3 Dementia in other diseases classified elsewhere, moderate, with mood disturbance: Secondary | ICD-10-CM

## 2022-12-28 MED ORDER — CEPHALEXIN 500 MG PO CAPS: 500.0000 mg | ORAL_CAPSULE | Freq: Two times a day (BID) | ORAL | 0 refills | Status: DC

## 2022-12-28 MED ORDER — BREZTRI AEROSPHERE 160-9-4.8 MCG/ACT IN AERO: 2.0000 | INHALATION_SPRAY | Freq: Two times a day (BID) | RESPIRATORY_TRACT | 5 refills | Status: DC

## 2022-12-28 NOTE — Assessment & Plan Note (Signed)
Continue propranolol ER 60 mg once daily and crestor 5 mg daily.

## 2022-12-28 NOTE — Assessment & Plan Note (Signed)
NO SENSATION in left foot/leg due to nerve impingement from back.

## 2022-12-28 NOTE — Assessment & Plan Note (Addendum)
The current medical regimen has been fairly effective;  although it appears his lasix was stopped. He will need some diuresis due to swelling and weeping of right leg On propranolol ER 60 mg daily

## 2022-12-28 NOTE — Assessment & Plan Note (Signed)
>>  ASSESSMENT AND PLAN FOR HYPERTENSIVE HEART DISEASE WRITTEN ON 12/28/2022  2:14 PM BY LEAL-BORJAS, Montrell Cessna I, CMA  Continue propranolol ER 60 mg once daily and crestor 5 mg daily.

## 2022-12-28 NOTE — Assessment & Plan Note (Signed)
Partial amputation with toe hanging. Concerning for gangrene.  Patient has no sensation and so has no pain associated.  Will need complete amputation and likely IV antibiotic.  Sent to ED. Discussed with charge nurse and sent photos to charge phone at [facility] ED.

## 2022-12-28 NOTE — Assessment & Plan Note (Signed)
Continue with Albuterol inhaler. Nebulizer: duoneb given. Helped breathing.  Samples of Trelegy 100 mcg was given at the office.

## 2022-12-28 NOTE — Assessment & Plan Note (Signed)
Continue aricept 10 mg before bed and memantine xr 28 mg daily

## 2022-12-28 NOTE — Assessment & Plan Note (Deleted)
Continue with Albuterol inhaler. Nebulizer: duoneb given. Helped breathing.

## 2022-12-28 NOTE — Assessment & Plan Note (Signed)
Continue cpap with oxygen 2 L

## 2022-12-28 NOTE — Assessment & Plan Note (Addendum)
Well controlled.  No changes to medicines. Rosuvastatin 5 mg daily. Continue to work on eating a healthy diet and exercise.

## 2022-12-28 NOTE — Assessment & Plan Note (Addendum)
Poorly controlled, but has history of overuse. He does have real sources of pain, but he is high risk for abuse/overuse.

## 2022-12-28 NOTE — Progress Notes (Signed)
Subjective:  Patient ID: Shawn Osborn, male    DOB: Jan 20, 1951  Age: 72 y.o. MRN: [id number]  Chief Complaint  Patient presents with   Toe Injury   Patient with hypertension, copd, cad, and severe neuropathy who presents for left second toe partial amputation. He has not sought medical care. It happened last weekend. Patient stepped on the prongs from a drop cord and they pierced his foot. No fever. Patient has put peroxide and salve on it. Patient also has a macerating wound on posterior left leg.   COPD: Only inhaler he has been using is albuterol nebulizer twice daily and uses albuterol hfa in the middle of the night if he becomes shortness of breath. In the past he received patient assistance for maintenance inhaler, but he says he has not qualified. Patient is smoking 1 ppd.   Chronic Pain syndrome: No longer is seen at the pain clinic. Patient had no way to get there.  Managed by pain clinic. ON suboxone and goes to pain clinic.  Lyrica 50 mg three times a day, and allopurinol 300 mg daily. Patient has chronic back pain, gout, knee pain.    Alzheimer's Dementia: aricept 10 mg before bed and Namenda xr 28 mg once daily.   Depression: zoloft 100 mg once daily. ON seroquel 50 mg before bed, rozerem 8 mg before bed, and hydroxyzine 50 mg before bed.        10/17/2022   10:53 AM 04/17/2022    1:57 PM 02/16/2022   11:21 AM  PHQ9 SCORE ONLY  PHQ-9 Total Score 0 0 0      Hypertensive heart disease with CORONARY ARTERY DISEASE/CONGESTIVE HEART FAILURE: On propranolol ER 60 mg daily (also for tremor.), crestor 5 mg before bed.  Vitamin D deficiency: on Vitamin D 50K weekly.  GERD: well controlled on omeprazole 40 mg daily.  BPH: on tamsulosin 0.4 mg 2 before bed.  Neuropathy: on lyrica 50 mg three times a day. MMA high consistent with mild b12 deficiency. Patient on b12 daily. Patient has torn rotator cuff in left shoulder.  Right shoulder hurts also and aches down his arms. He feels  pain runs down arms.          10/30/2022    4:14 PM 10/17/2022   10:53 AM 04/17/2022    1:57 PM 02/16/2022   11:21 AM 10/27/2021    9:00 AM  Depression screen PHQ 2/9  Decreased Interest 0 0 0 0 0  Down, Depressed, Hopeless 0 0 0 0 0  PHQ - 2 Score 0 0 0 0 0         04/17/2022    1:58 PM 06/12/2022    9:48 AM 10/17/2022   10:53 AM 10/30/2022    3:56 PM 12/28/2022    8:40 AM  Fall Risk  Falls in the past year? 0 0 0 0 0  Was there an injury with Fall? 0 0 0 0 0  Fall Risk Category Calculator 0 0 0 0 0  Fall Risk Category (Retired) Low Low Low Low   (RETIRED) Patient Fall Risk Level Moderate fall risk Low fall risk Moderate fall risk Low fall risk   Patient at Risk for Falls Due to Impaired balance/gait;Medication side effect Impaired balance/gait;Impaired mobility Impaired mobility;Impaired balance/gait Impaired balance/gait;Impaired mobility No Fall Risks  Fall risk Follow up Falls evaluation completed;Education provided;Falls prevention discussed Falls evaluation completed Falls evaluation completed Falls evaluation completed;Education provided;Falls prevention discussed Falls evaluation completed  Review of Systems  Constitutional:  Negative for appetite change, fatigue and fever.  HENT:  Negative for congestion, ear pain, sinus pressure and sore throat.   Respiratory:  Negative for cough, chest tightness, shortness of breath and wheezing.   Cardiovascular:  Positive for leg swelling (Bilateral). Negative for chest pain and palpitations.  Gastrointestinal:  Negative for abdominal pain, constipation, diarrhea, nausea and vomiting.  Genitourinary:  Negative for dysuria and hematuria.  Musculoskeletal:  Negative for arthralgias, back pain, joint swelling and myalgias.  Skin:  Positive for wound (Left foot 2nd toe hanging off- patient states he cut it over the weekend.). Negative for rash.  Neurological:  Negative for dizziness, weakness and headaches.  Psychiatric/Behavioral:   Negative for dysphoric mood. The patient is not nervous/anxious.     Current Outpatient Medications on File Prior to Visit  Medication Sig Dispense Refill   albuterol (PROVENTIL) (2.5 MG/3ML) 0.083% nebulizer solution Take 3 mLs (2.5 mg total) by nebulization every 6 (six) hours as needed for wheezing or shortness of breath. 120 mL 12   albuterol (VENTOLIN HFA) 108 (90 Base) MCG/ACT inhaler Inhale 2 puffs into the lungs every 6 (six) hours as needed for wheezing or shortness of breath. 8 g 3   allopurinol (ZYLOPRIM) 300 MG tablet TAKE ONE TABLET BY MOUTH EVERY DAY at 1pm 30 tablet 2   donepezil (ARICEPT) 10 MG tablet TAKE ONE TABLET BY MOUTH EVERY EVENING 90 tablet 3   ferrous sulfate 324 MG TBEC Take 324 mg by mouth daily with breakfast. Take 1 every other day     hydrocortisone 2.5%-Eucerin equivalent 1:1 cream mixture Apply topically 2 (two) times daily. 120 g 0   hydrOXYzine (ATARAX) 50 MG tablet TAKE ONE TABLET BY MOUTH AT BEDTIME 30 tablet 2   ipratropium-albuterol (DUONEB) 0.5-2.5 (3) MG/3ML SOLN Take 3 mLs by nebulization every 4 (four) hours as needed. 360 mL 2   memantine (NAMENDA XR) 28 MG CP24 24 hr capsule TAKE ONE CAPSULE BY MOUTH AT BEDTIME 90 capsule 1   omega-3 acid ethyl esters (LOVAZA) 1 g capsule TAKE 2 CAPSULES BY MOUTH TWICE DAILY 360 capsule 0   omeprazole (PRILOSEC) 40 MG capsule TAKE ONE CAPSULE BY MOUTH EVERY DAY 30 capsule 2   pregabalin (LYRICA) 50 MG capsule TAKE ONE CAPSULE BY MOUTH EVERY DAY and TAKE ONE CAPSULE BY MOUTH EVERY DAY at 1pm and TAKE ONE CAPSULE BY MOUTH AT BEDTIME 90 capsule 2   propranolol ER (INDERAL LA) 60 MG 24 hr capsule TAKE ONE CAPSULE BY MOUTH EVERY DAY 30 capsule 2   QUEtiapine (SEROQUEL) 50 MG tablet TAKE ONE TABLET BY MOUTH AT BEDTIME 30 tablet 2   ramelteon (ROZEREM) 8 MG tablet TAKE ONE TABLET BY MOUTH AT BEDTIME 30 tablet 11   rosuvastatin (CRESTOR) 5 MG tablet TAKE ONE TABLET BY MOUTH AT BEDTIME 30 tablet 2   sertraline (ZOLOFT) 100 MG  tablet TAKE ONE TABLET BY MOUTH AT BEDTIME 30 tablet 2   tamsulosin (FLOMAX) 0.4 MG CAPS capsule TAKE 2 CAPSULES BY MOUTH EVERY EVENING 60 capsule 3   triamcinolone cream (KENALOG) 0.1 % Apply 1 application. topically 2 (two) times daily as needed (to ears for itching). 30 g 2   Vitamin D, Ergocalciferol, (DRISDOL) 1.25 MG (50000 UNIT) CAPS capsule TAKE ONE CAPSULE BY MOUTH EVERY WEEK 15 capsule 3   No current facility-administered medications on file prior to visit.   Past Medical History:  Diagnosis Date   Acute combined systolic and diastolic CHF,  NYHA class 3 (HCC) 10/08/2019   Acute delirium 10/08/2019   Acute exacerbation of CHF (congestive heart failure) ([place]) 10/08/2019   Acute gout of multiple sites 07/01/2016   Acute respiratory failure with hypoxia and hypercapnia (HCC) 10/08/2019   Allergy    Anxiety    Arthritis    Gout, osteoarthritis in Back and Knees   Auditory hallucinations 05/01/2022   Cataract    CHF (congestive heart failure) ([place])    Chronic bronchitis    Community acquired pneumonia 10/08/2019   COPD (chronic obstructive pulmonary disease) ([place]) 10/08/2019   Dementia ([place]) 10/08/2019   Elevated troponin 10/08/2019   Emphysema of lung ([place])    Essential hypertension 07/03/2016   GERD (gastroesophageal reflux disease)    History of kidney stones    HLD (hyperlipidemia) 10/08/2019   Hypercapnic respiratory failure ([place]) 10/08/2019   Hyperlipidemia    Hypertension    Hypertensive heart disease 10/08/2019   Morbid obesity due to excess calories ([place]) 10/08/2019   Narcotic overdose ([place]) 10/08/2019   Neuropathy    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   NSTEMI (non-ST elevated myocardial infarction) ([place]) 10/08/2019   Obstructive sleep apnea    Occlusion and stenosis of carotid artery without mention of cerebral infarction 12/13/2011   OSA on CPAP 10/08/2019   Pain in joint of left shoulder 01/12/2019   Paranoia (psychosis) ([place]) 07/01/2016    Poorly-controlled hypertension 10/08/2019   Prediabetes    Psychotic disorder with delusions ([place]) 07/01/2016   Respiratory failure with hypoxia and hypercapnia ([place]) 10/08/2019   Restless leg syndrome 07/03/2016   Rheumatoid arthritis(714.0)    RLS (restless legs syndrome)    Sepsis ([place]) 10/08/2019   Sleep apnea    Spinal stenosis    Syncope 04/03/2019   Tear of left rotator cuff [id number]   Toxic metabolic encephalopathy [id number]   UTI (urinary tract infection) 10/08/2019   Vascular dementia with behavioral disturbance ([place]) 07/06/2016   Visual hallucinations 05/01/2022   Past Surgical History:  Procedure Laterality Date   APPENDECTOMY     CARPAL TUNNEL RELEASE     EYE SURGERY     INTRAVASCULAR PRESSURE WIRE/FFR STUDY N/A 10/20/2019   Procedure: INTRAVASCULAR PRESSURE WIRE/FFR STUDY;  Surgeon: Belva Crome, MD;  Location: [facility];  Service: Cardiovascular;  Laterality: N/A;   LEFT HEART CATH AND CORONARY ANGIOGRAPHY N/A 10/20/2019   Procedure: LEFT HEART CATH AND CORONARY ANGIOGRAPHY;  Surgeon: Belva Crome, MD;  Location: [facility];  Service: Cardiovascular;  Laterality: N/A;   [place], 2003, 2012   Laminectomy X 3    Family History  Problem Relation Age of Onset   Heart disease Mother    Hypertension Mother    Social History   Socioeconomic History   Marital status: Widowed    Spouse name: Not on file   Number of children: 4   Years of education: Not on file   Highest education level: Not on file  Occupational History   Not on file  Tobacco Use   Smoking status: Every Day    Packs/day: 0.75    Years: 53.00    Total pack years: 39.75    Types: Cigarettes   Smokeless tobacco: Current    Types: Chew  Vaping Use   Vaping Use: Never used  Substance and Sexual Activity   Alcohol use: No   Drug use: No   Sexual activity: Not Currently  Other Topics Concern   Not on  file  Social History Narrative   Not on file   Social  Determinants of Health   Financial Resource Strain: Low Risk  (10/30/2022)   Overall Financial Resource Strain (CARDIA)    Difficulty of Paying Living Expenses: Not very hard  Food Insecurity: No Food Insecurity (10/30/2022)   Hunger Vital Sign    Worried About Running Out of Food in the Last Year: Never true    Ran Out of Food in the Last Year: Never true  Transportation Needs: No Transportation Needs (10/30/2022)   PRAPARE - Hydrologist (Medical): No    Lack of Transportation (Non-Medical): No  Physical Activity: Inactive (10/30/2022)   Exercise Vital Sign    Days of Exercise per Week: 0 days    Minutes of Exercise per Session: 0 min  Stress: No Stress Concern Present (10/30/2022)   [place]    Feeling of Stress : Not at all  Social Connections: Moderately Isolated (10/30/2022)   Social Connection and Isolation Panel [NHANES]    Frequency of Communication with Friends and Family: More than three times a week    Frequency of Social Gatherings with Friends and Family: More than three times a week    Attends Religious Services: More than 4 times per year    Active Member of [employer] or Organizations: No    Attends Archivist Meetings: Never    Marital Status: Widowed    Objective:  BP (!) 158/62 (BP Location: Left Arm, Patient Position: Sitting)   Pulse (!) 57   Temp (!) 97 F (36.1 C) (Temporal)   Ht 5' 10"$  (1.778 m)   Wt 297 lb (134.7 kg)   SpO2 99%   BMI 42.62 kg/m      12/28/2022    8:35 AM 10/17/2022   10:47 AM 07/03/2022   10:24 AM  BP/Weight  Systolic BP 0000000 123456 123456  Diastolic BP 62 64 58  Wt. (Lbs) 297 287 278  BMI 42.62 kg/m2 41.18 kg/m2 38.77 kg/m2    Physical Exam Vitals reviewed.  Constitutional:      Appearance: He is obese.     Comments: Poor hygiene. Disheveled.   Cardiovascular:     Rate and Rhythm: Normal rate and regular rhythm.     Heart  sounds: Normal heart sounds.  Pulmonary:     Effort: Pulmonary effort is normal.     Breath sounds: Normal breath sounds.  Abdominal:     General: Abdomen is flat.  Musculoskeletal:       Feet:  Feet:     Comments: Left second toe partial amputation (end hanging off) with black eschar on bottom and end. Foul odor.   Skin:         Comments: Area weeping, macerated tissue.   Neurological:     Mental Status: He is alert and oriented to person, place, and time.  Psychiatric:        Mood and Affect: Mood normal.        Behavior: Behavior normal.     Diabetic Foot Exam - Simple   No data filed      Lab Results  Component Value Date   WBC 11.3 (H) 10/17/2022   HGB 12.5 (L) 10/17/2022   HCT 39.3 10/17/2022   PLT 255 10/17/2022   GLUCOSE 89 10/17/2022   CHOL 139 10/17/2022   TRIG 127 10/17/2022   HDL 54 10/17/2022   LDLCALC 63 10/17/2022  ALT 10 10/17/2022   AST 16 10/17/2022   NA 145 (H) 10/17/2022   K 5.1 10/17/2022   CL 104 10/17/2022   CREATININE 0.70 (L) 10/17/2022   BUN 19 10/17/2022   CO2 26 10/17/2022   TSH 3.200 05/01/2022   INR 0.9 08/30/2020   HGBA1C 5.7 (H) 07/03/2022      Assessment & Plan:    Partial traumatic amputation of one lesser toe, left, initial encounter [facility]) Assessment & Plan: Partial amputation with toe hanging. Concerning for gangrene.  Patient has no sensation and so has no pain associated.  Will need complete amputation and likely IV antibiotic.  Sent to ED. Discussed with charge nurse and sent photos to charge phone at [facility] ED.   Open leg wound, right, initial encounter Assessment & Plan: Left lower leg weeping and macerated posteriorly. He will need wound consultation while in hospital and will need referral to wound care upon discharge as I anticipate he will require hospitalization for his toe.    Mixed hyperlipidemia Assessment & Plan: Well controlled.  No changes to medicines. Rosuvastatin 5 mg daily. Continue to work  on eating a healthy diet and exercise.     Hypertensive heart disease with chronic combined systolic and diastolic congestive heart failure (HCC) Assessment & Plan:   OSA on CPAP Assessment & Plan: Continue cpap with oxygen 2 L   Moderate early onset Alzheimer's dementia with mood disturbance (HCC) Assessment & Plan: Continue aricept 10 mg before bed and memantine xr 28 mg daily    Chronic pain syndrome Assessment & Plan: Poorly controlled, but has history of overuse. He does have real sources of pain, but he is high risk for abuse/overuse.    Prediabetes Assessment & Plan: Hemoglobin A1c 5.7%, 3 month avg of blood sugars, is in prediabetic range.  In order to prevent progression to diabetes, recommend low carb diet and regular exercise    Chronic combined systolic and diastolic congestive heart failure (HCC) Assessment & Plan: The current medical regimen has been fairly effective;  although it appears his lasix was stopped. He will need some diuresis due to swelling and weeping of right leg On propranolol ER 60 mg daily    Neuropathy Assessment & Plan: NO SENSATION in left foot/leg due to nerve impingement from back.      Follow-up: No follow-ups on file.  An After Visit Summary was printed and given to the patient.  Geralynn Ochs I Leal-Borjas,acting as a scribe for Rochel Brome, MD.,have documented all relevant documentation on the behalf of Rochel Brome, MD,as directed by  Rochel Brome, MD while in the presence of Rochel Brome, MD.   Total time spent on today's visit was greater than 40 minutes, including both face-to-face time and nonface-to-face time personally spent on review of chart (labs and imaging), discussing labs and goals, discussing further work-up, treatment options, referrals to specialist if needed, reviewing outside records of pertinent, answering patient's questions, and coordinating care.   Rochel Brome, MD [facility] [phone]

## 2022-12-28 NOTE — Assessment & Plan Note (Signed)
Left lower leg weeping and macerated posteriorly. He will need wound consultation while in hospital and will need referral to wound care upon discharge as I anticipate he will require hospitalization for his toe.

## 2022-12-28 NOTE — Assessment & Plan Note (Signed)
Hemoglobin A1c 5.7%, 3 month avg of blood sugars, is in prediabetic range.  In order to prevent progression to diabetes, recommend low carb diet and regular exercise

## 2022-12-30 DIAGNOSIS — N400 Enlarged prostate without lower urinary tract symptoms: Secondary | ICD-10-CM | POA: Diagnosis not present

## 2022-12-30 DIAGNOSIS — G629 Polyneuropathy, unspecified: Secondary | ICD-10-CM | POA: Diagnosis not present

## 2022-12-30 DIAGNOSIS — I739 Peripheral vascular disease, unspecified: Secondary | ICD-10-CM | POA: Diagnosis not present

## 2022-12-30 DIAGNOSIS — Z89422 Acquired absence of other left toe(s): Secondary | ICD-10-CM | POA: Diagnosis not present

## 2022-12-30 DIAGNOSIS — Z993 Dependence on wheelchair: Secondary | ICD-10-CM | POA: Diagnosis not present

## 2022-12-30 DIAGNOSIS — I4510 Unspecified right bundle-branch block: Secondary | ICD-10-CM | POA: Diagnosis not present

## 2022-12-30 DIAGNOSIS — I5032 Chronic diastolic (congestive) heart failure: Secondary | ICD-10-CM | POA: Diagnosis not present

## 2022-12-30 DIAGNOSIS — M109 Gout, unspecified: Secondary | ICD-10-CM | POA: Diagnosis not present

## 2022-12-30 DIAGNOSIS — L03116 Cellulitis of left lower limb: Secondary | ICD-10-CM | POA: Diagnosis not present

## 2022-12-30 DIAGNOSIS — L02612 Cutaneous abscess of left foot: Secondary | ICD-10-CM | POA: Diagnosis not present

## 2022-12-30 DIAGNOSIS — M868X7 Other osteomyelitis, ankle and foot: Secondary | ICD-10-CM | POA: Diagnosis not present

## 2022-12-30 DIAGNOSIS — I96 Gangrene, not elsewhere classified: Secondary | ICD-10-CM | POA: Diagnosis not present

## 2022-12-30 DIAGNOSIS — M87278 Osteonecrosis due to previous trauma, left toe(s): Secondary | ICD-10-CM | POA: Diagnosis not present

## 2022-12-30 DIAGNOSIS — G309 Alzheimer's disease, unspecified: Secondary | ICD-10-CM | POA: Diagnosis not present

## 2022-12-30 DIAGNOSIS — R600 Localized edema: Secondary | ICD-10-CM | POA: Diagnosis not present

## 2022-12-30 DIAGNOSIS — I491 Atrial premature depolarization: Secondary | ICD-10-CM | POA: Diagnosis not present

## 2022-12-30 DIAGNOSIS — S99922A Unspecified injury of left foot, initial encounter: Secondary | ICD-10-CM | POA: Diagnosis not present

## 2022-12-30 DIAGNOSIS — M86672 Other chronic osteomyelitis, left ankle and foot: Secondary | ICD-10-CM | POA: Diagnosis not present

## 2022-12-30 DIAGNOSIS — L03032 Cellulitis of left toe: Secondary | ICD-10-CM | POA: Diagnosis not present

## 2022-12-30 DIAGNOSIS — S98132A Complete traumatic amputation of one left lesser toe, initial encounter: Secondary | ICD-10-CM | POA: Diagnosis not present

## 2022-12-30 DIAGNOSIS — F0280 Dementia in other diseases classified elsewhere without behavioral disturbance: Secondary | ICD-10-CM | POA: Diagnosis not present

## 2022-12-30 DIAGNOSIS — M869 Osteomyelitis, unspecified: Secondary | ICD-10-CM | POA: Diagnosis not present

## 2022-12-30 DIAGNOSIS — Z8249 Family history of ischemic heart disease and other diseases of the circulatory system: Secondary | ICD-10-CM | POA: Diagnosis not present

## 2022-12-30 DIAGNOSIS — I440 Atrioventricular block, first degree: Secondary | ICD-10-CM | POA: Diagnosis not present

## 2022-12-30 DIAGNOSIS — E785 Hyperlipidemia, unspecified: Secondary | ICD-10-CM | POA: Diagnosis not present

## 2022-12-30 DIAGNOSIS — F0283 Dementia in other diseases classified elsewhere, unspecified severity, with mood disturbance: Secondary | ICD-10-CM | POA: Diagnosis not present

## 2022-12-30 DIAGNOSIS — M86172 Other acute osteomyelitis, left ankle and foot: Secondary | ICD-10-CM | POA: Diagnosis not present

## 2022-12-30 DIAGNOSIS — J449 Chronic obstructive pulmonary disease, unspecified: Secondary | ICD-10-CM | POA: Diagnosis not present

## 2022-12-30 DIAGNOSIS — L03115 Cellulitis of right lower limb: Secondary | ICD-10-CM | POA: Diagnosis not present

## 2022-12-30 DIAGNOSIS — F17210 Nicotine dependence, cigarettes, uncomplicated: Secondary | ICD-10-CM | POA: Diagnosis not present

## 2023-01-01 HISTORY — PX: AMPUTATION TOE: SHX6595

## 2023-01-02 NOTE — Progress Notes (Signed)
  Chronic Care Management Note  01/02/2023 Name: Shawn Osborn MRN: [id number] DOB: Jul 07, 1951  Shawn Osborn is a 72 y.o. year old male who is a primary care patient of Cox, Kirsten, MD and is actively engaged with the Chronic Care Management team. I reached out to [name] by phone today to assist with re-scheduling a follow up visit with the RN Case Manager  Follow up plan: Unable to make contact on outreach attempts x 3. PCP Cox, Kirsten, MD notified via routed documentation in medical record.   Noreene Larsson, [place], [place] [address] Direct Dial: [phone] [email or web address]

## 2023-01-06 DIAGNOSIS — N400 Enlarged prostate without lower urinary tract symptoms: Secondary | ICD-10-CM | POA: Diagnosis not present

## 2023-01-06 DIAGNOSIS — Z993 Dependence on wheelchair: Secondary | ICD-10-CM | POA: Diagnosis not present

## 2023-01-06 DIAGNOSIS — F0280 Dementia in other diseases classified elsewhere without behavioral disturbance: Secondary | ICD-10-CM | POA: Diagnosis not present

## 2023-01-06 DIAGNOSIS — I5030 Unspecified diastolic (congestive) heart failure: Secondary | ICD-10-CM | POA: Diagnosis not present

## 2023-01-06 DIAGNOSIS — F17220 Nicotine dependence, chewing tobacco, uncomplicated: Secondary | ICD-10-CM | POA: Diagnosis not present

## 2023-01-06 DIAGNOSIS — Z7951 Long term (current) use of inhaled steroids: Secondary | ICD-10-CM | POA: Diagnosis not present

## 2023-01-06 DIAGNOSIS — Z4781 Encounter for orthopedic aftercare following surgical amputation: Secondary | ICD-10-CM | POA: Diagnosis not present

## 2023-01-06 DIAGNOSIS — R7303 Prediabetes: Secondary | ICD-10-CM | POA: Diagnosis not present

## 2023-01-06 DIAGNOSIS — M109 Gout, unspecified: Secondary | ICD-10-CM | POA: Diagnosis not present

## 2023-01-06 DIAGNOSIS — Z89422 Acquired absence of other left toe(s): Secondary | ICD-10-CM | POA: Diagnosis not present

## 2023-01-06 DIAGNOSIS — G629 Polyneuropathy, unspecified: Secondary | ICD-10-CM | POA: Diagnosis not present

## 2023-01-06 DIAGNOSIS — M86672 Other chronic osteomyelitis, left ankle and foot: Secondary | ICD-10-CM | POA: Diagnosis not present

## 2023-01-06 DIAGNOSIS — Z79899 Other long term (current) drug therapy: Secondary | ICD-10-CM | POA: Diagnosis not present

## 2023-01-06 DIAGNOSIS — E785 Hyperlipidemia, unspecified: Secondary | ICD-10-CM | POA: Diagnosis not present

## 2023-01-06 DIAGNOSIS — Z9181 History of falling: Secondary | ICD-10-CM | POA: Diagnosis not present

## 2023-01-06 DIAGNOSIS — Z9981 Dependence on supplemental oxygen: Secondary | ICD-10-CM | POA: Diagnosis not present

## 2023-01-06 DIAGNOSIS — L03116 Cellulitis of left lower limb: Secondary | ICD-10-CM | POA: Diagnosis not present

## 2023-01-06 DIAGNOSIS — J449 Chronic obstructive pulmonary disease, unspecified: Secondary | ICD-10-CM | POA: Diagnosis not present

## 2023-01-06 DIAGNOSIS — G309 Alzheimer's disease, unspecified: Secondary | ICD-10-CM | POA: Diagnosis not present

## 2023-01-06 DIAGNOSIS — F17210 Nicotine dependence, cigarettes, uncomplicated: Secondary | ICD-10-CM | POA: Diagnosis not present

## 2023-01-07 ENCOUNTER — Telehealth: Payer: Self-pay

## 2023-01-07 NOTE — Transitions of Care (Post Inpatient/ED Visit) (Unsigned)
   01/07/2023  Name: NYKEL WIBBENMEYER MRN: [id number] DOB: 1950/12/29  Today's TOC FU Call Status: Today's TOC FU Call Status:: Unsuccessul Call (1st Attempt) Unsuccessful Call (1st Attempt) Date: 01/07/23  Attempted to reach the patient regarding the most recent Inpatient/ED visit.  Follow Up Plan: Additional outreach attempts will be made to reach the patient to complete the Transitions of Care (Post Inpatient/ED visit) call.   Signature Juanda Crumble, [place] Direct Dial [phone]

## 2023-01-08 NOTE — Transitions of Care (Post Inpatient/ED Visit) (Signed)
   01/08/2023  Name: ABDALLA ADORNETTO MRN: [id number] DOB: 09-05-1951  Today's TOC FU Call Status: Today's TOC FU Call Status:: Successful TOC FU Call Competed Unsuccessful Call (1st Attempt) Date: 01/07/23 [facility] FU Call Complete Date: 01/08/23  Transition Care Management Follow-up Telephone Call Date of Discharge: 01/04/23 Discharge Facility: [facility] Type of Discharge: Inpatient Admission Primary Inpatient Discharge Diagnosis:: left foot injury How have you been since you were released from the hospital?: Better Any questions or concerns?: No  Items Reviewed: Did you receive and understand the discharge instructions provided?: Yes Medications obtained and verified?: Yes (Medications Reviewed) Any new allergies since your discharge?: No Dietary orders reviewed?: NA Do you have support at home?: Yes People in Home: child(ren), adult  Home Care and Equipment/Supplies: [place] Ordered?: Yes Name of [place]:: unknown Has Agency set up a time to come to your home?: Yes [place] Visit Date: 01/07/23 Any new equipment or medical supplies ordered?: NA  Functional Questionnaire: Do you need assistance with bathing/showering or dressing?: No Do you need assistance with meal preparation?: No Do you need assistance with eating?: No Do you have difficulty maintaining continence: No Do you need assistance with getting out of bed/getting out of a chair/moving?: No Do you have difficulty managing or taking your medications?: No  Folllow up appointments reviewed: PCP Follow-up appointment confirmed?: [facility] Follow-up appointment confirmed?: Yes Date of Specialist follow-up appointment?: 01/15/23 Follow-Up Specialty Provider:: Dr Gean Quint Do you need transportation to your follow-up appointment?: No Do you understand care options if your condition(s) worsen?: Yes-patient verbalized understanding    [place], [place] Nurse Health Advisor Direct Dial [phone]

## 2023-01-09 ENCOUNTER — Other Ambulatory Visit: Payer: Self-pay | Admitting: Family Medicine

## 2023-01-09 DIAGNOSIS — G629 Polyneuropathy, unspecified: Secondary | ICD-10-CM | POA: Diagnosis not present

## 2023-01-09 DIAGNOSIS — Z4781 Encounter for orthopedic aftercare following surgical amputation: Secondary | ICD-10-CM | POA: Diagnosis not present

## 2023-01-09 DIAGNOSIS — F17220 Nicotine dependence, chewing tobacco, uncomplicated: Secondary | ICD-10-CM | POA: Diagnosis not present

## 2023-01-09 DIAGNOSIS — Z89422 Acquired absence of other left toe(s): Secondary | ICD-10-CM | POA: Diagnosis not present

## 2023-01-09 DIAGNOSIS — F17210 Nicotine dependence, cigarettes, uncomplicated: Secondary | ICD-10-CM | POA: Diagnosis not present

## 2023-01-09 DIAGNOSIS — G309 Alzheimer's disease, unspecified: Secondary | ICD-10-CM | POA: Diagnosis not present

## 2023-01-09 DIAGNOSIS — L03116 Cellulitis of left lower limb: Secondary | ICD-10-CM | POA: Diagnosis not present

## 2023-01-09 DIAGNOSIS — F0280 Dementia in other diseases classified elsewhere without behavioral disturbance: Secondary | ICD-10-CM | POA: Diagnosis not present

## 2023-01-09 DIAGNOSIS — M109 Gout, unspecified: Secondary | ICD-10-CM | POA: Diagnosis not present

## 2023-01-09 DIAGNOSIS — M86672 Other chronic osteomyelitis, left ankle and foot: Secondary | ICD-10-CM | POA: Diagnosis not present

## 2023-01-09 DIAGNOSIS — R7303 Prediabetes: Secondary | ICD-10-CM | POA: Diagnosis not present

## 2023-01-09 DIAGNOSIS — E785 Hyperlipidemia, unspecified: Secondary | ICD-10-CM | POA: Diagnosis not present

## 2023-01-09 DIAGNOSIS — I5030 Unspecified diastolic (congestive) heart failure: Secondary | ICD-10-CM | POA: Diagnosis not present

## 2023-01-09 DIAGNOSIS — J449 Chronic obstructive pulmonary disease, unspecified: Secondary | ICD-10-CM | POA: Diagnosis not present

## 2023-01-09 DIAGNOSIS — N400 Enlarged prostate without lower urinary tract symptoms: Secondary | ICD-10-CM | POA: Diagnosis not present

## 2023-01-09 DIAGNOSIS — Z993 Dependence on wheelchair: Secondary | ICD-10-CM | POA: Diagnosis not present

## 2023-01-14 DIAGNOSIS — Z993 Dependence on wheelchair: Secondary | ICD-10-CM | POA: Diagnosis not present

## 2023-01-14 DIAGNOSIS — Z89422 Acquired absence of other left toe(s): Secondary | ICD-10-CM | POA: Diagnosis not present

## 2023-01-14 DIAGNOSIS — N400 Enlarged prostate without lower urinary tract symptoms: Secondary | ICD-10-CM | POA: Diagnosis not present

## 2023-01-14 DIAGNOSIS — M86672 Other chronic osteomyelitis, left ankle and foot: Secondary | ICD-10-CM | POA: Diagnosis not present

## 2023-01-14 DIAGNOSIS — E785 Hyperlipidemia, unspecified: Secondary | ICD-10-CM | POA: Diagnosis not present

## 2023-01-14 DIAGNOSIS — F0280 Dementia in other diseases classified elsewhere without behavioral disturbance: Secondary | ICD-10-CM | POA: Diagnosis not present

## 2023-01-14 DIAGNOSIS — M109 Gout, unspecified: Secondary | ICD-10-CM | POA: Diagnosis not present

## 2023-01-14 DIAGNOSIS — I5030 Unspecified diastolic (congestive) heart failure: Secondary | ICD-10-CM | POA: Diagnosis not present

## 2023-01-14 DIAGNOSIS — J449 Chronic obstructive pulmonary disease, unspecified: Secondary | ICD-10-CM | POA: Diagnosis not present

## 2023-01-14 DIAGNOSIS — G309 Alzheimer's disease, unspecified: Secondary | ICD-10-CM | POA: Diagnosis not present

## 2023-01-14 DIAGNOSIS — F17220 Nicotine dependence, chewing tobacco, uncomplicated: Secondary | ICD-10-CM | POA: Diagnosis not present

## 2023-01-14 DIAGNOSIS — F17210 Nicotine dependence, cigarettes, uncomplicated: Secondary | ICD-10-CM | POA: Diagnosis not present

## 2023-01-14 DIAGNOSIS — R7303 Prediabetes: Secondary | ICD-10-CM | POA: Diagnosis not present

## 2023-01-14 DIAGNOSIS — G629 Polyneuropathy, unspecified: Secondary | ICD-10-CM | POA: Diagnosis not present

## 2023-01-14 DIAGNOSIS — Z4781 Encounter for orthopedic aftercare following surgical amputation: Secondary | ICD-10-CM | POA: Diagnosis not present

## 2023-01-14 DIAGNOSIS — L03116 Cellulitis of left lower limb: Secondary | ICD-10-CM | POA: Diagnosis not present

## 2023-01-16 DIAGNOSIS — M86672 Other chronic osteomyelitis, left ankle and foot: Secondary | ICD-10-CM

## 2023-01-16 DIAGNOSIS — M109 Gout, unspecified: Secondary | ICD-10-CM

## 2023-01-16 DIAGNOSIS — F17210 Nicotine dependence, cigarettes, uncomplicated: Secondary | ICD-10-CM

## 2023-01-16 DIAGNOSIS — E785 Hyperlipidemia, unspecified: Secondary | ICD-10-CM

## 2023-01-16 DIAGNOSIS — L03116 Cellulitis of left lower limb: Secondary | ICD-10-CM

## 2023-01-16 DIAGNOSIS — G629 Polyneuropathy, unspecified: Secondary | ICD-10-CM

## 2023-01-16 DIAGNOSIS — Z89422 Acquired absence of other left toe(s): Secondary | ICD-10-CM

## 2023-01-16 DIAGNOSIS — I5030 Unspecified diastolic (congestive) heart failure: Secondary | ICD-10-CM

## 2023-01-16 DIAGNOSIS — F17220 Nicotine dependence, chewing tobacco, uncomplicated: Secondary | ICD-10-CM

## 2023-01-16 DIAGNOSIS — F0280 Dementia in other diseases classified elsewhere without behavioral disturbance: Secondary | ICD-10-CM

## 2023-01-16 DIAGNOSIS — N400 Enlarged prostate without lower urinary tract symptoms: Secondary | ICD-10-CM

## 2023-01-16 DIAGNOSIS — J449 Chronic obstructive pulmonary disease, unspecified: Secondary | ICD-10-CM

## 2023-01-16 DIAGNOSIS — Z4781 Encounter for orthopedic aftercare following surgical amputation: Secondary | ICD-10-CM

## 2023-01-16 DIAGNOSIS — R7303 Prediabetes: Secondary | ICD-10-CM

## 2023-01-16 DIAGNOSIS — G309 Alzheimer's disease, unspecified: Secondary | ICD-10-CM

## 2023-01-17 DIAGNOSIS — Z4781 Encounter for orthopedic aftercare following surgical amputation: Secondary | ICD-10-CM | POA: Diagnosis not present

## 2023-01-17 DIAGNOSIS — M86672 Other chronic osteomyelitis, left ankle and foot: Secondary | ICD-10-CM | POA: Diagnosis not present

## 2023-01-17 DIAGNOSIS — E785 Hyperlipidemia, unspecified: Secondary | ICD-10-CM | POA: Diagnosis not present

## 2023-01-17 DIAGNOSIS — G309 Alzheimer's disease, unspecified: Secondary | ICD-10-CM | POA: Diagnosis not present

## 2023-01-17 DIAGNOSIS — N400 Enlarged prostate without lower urinary tract symptoms: Secondary | ICD-10-CM | POA: Diagnosis not present

## 2023-01-17 DIAGNOSIS — F17220 Nicotine dependence, chewing tobacco, uncomplicated: Secondary | ICD-10-CM | POA: Diagnosis not present

## 2023-01-17 DIAGNOSIS — M109 Gout, unspecified: Secondary | ICD-10-CM | POA: Diagnosis not present

## 2023-01-17 DIAGNOSIS — F0280 Dementia in other diseases classified elsewhere without behavioral disturbance: Secondary | ICD-10-CM | POA: Diagnosis not present

## 2023-01-17 DIAGNOSIS — R7303 Prediabetes: Secondary | ICD-10-CM | POA: Diagnosis not present

## 2023-01-17 DIAGNOSIS — J449 Chronic obstructive pulmonary disease, unspecified: Secondary | ICD-10-CM | POA: Diagnosis not present

## 2023-01-17 DIAGNOSIS — Z993 Dependence on wheelchair: Secondary | ICD-10-CM | POA: Diagnosis not present

## 2023-01-17 DIAGNOSIS — I5030 Unspecified diastolic (congestive) heart failure: Secondary | ICD-10-CM | POA: Diagnosis not present

## 2023-01-17 DIAGNOSIS — L03116 Cellulitis of left lower limb: Secondary | ICD-10-CM | POA: Diagnosis not present

## 2023-01-17 DIAGNOSIS — Z89422 Acquired absence of other left toe(s): Secondary | ICD-10-CM | POA: Diagnosis not present

## 2023-01-17 DIAGNOSIS — F17210 Nicotine dependence, cigarettes, uncomplicated: Secondary | ICD-10-CM | POA: Diagnosis not present

## 2023-01-17 DIAGNOSIS — G629 Polyneuropathy, unspecified: Secondary | ICD-10-CM | POA: Diagnosis not present

## 2023-01-22 ENCOUNTER — Ambulatory Visit: Payer: Medicare Other | Admitting: Family Medicine

## 2023-01-22 DIAGNOSIS — M86672 Other chronic osteomyelitis, left ankle and foot: Secondary | ICD-10-CM | POA: Diagnosis not present

## 2023-01-22 DIAGNOSIS — G309 Alzheimer's disease, unspecified: Secondary | ICD-10-CM | POA: Diagnosis not present

## 2023-01-22 DIAGNOSIS — Z993 Dependence on wheelchair: Secondary | ICD-10-CM | POA: Diagnosis not present

## 2023-01-22 DIAGNOSIS — Z4781 Encounter for orthopedic aftercare following surgical amputation: Secondary | ICD-10-CM | POA: Diagnosis not present

## 2023-01-22 DIAGNOSIS — N400 Enlarged prostate without lower urinary tract symptoms: Secondary | ICD-10-CM | POA: Diagnosis not present

## 2023-01-22 DIAGNOSIS — F0280 Dementia in other diseases classified elsewhere without behavioral disturbance: Secondary | ICD-10-CM | POA: Diagnosis not present

## 2023-01-22 DIAGNOSIS — M109 Gout, unspecified: Secondary | ICD-10-CM | POA: Diagnosis not present

## 2023-01-22 DIAGNOSIS — J449 Chronic obstructive pulmonary disease, unspecified: Secondary | ICD-10-CM | POA: Diagnosis not present

## 2023-01-22 DIAGNOSIS — G629 Polyneuropathy, unspecified: Secondary | ICD-10-CM | POA: Diagnosis not present

## 2023-01-22 DIAGNOSIS — Z89422 Acquired absence of other left toe(s): Secondary | ICD-10-CM | POA: Diagnosis not present

## 2023-01-22 DIAGNOSIS — F17220 Nicotine dependence, chewing tobacco, uncomplicated: Secondary | ICD-10-CM | POA: Diagnosis not present

## 2023-01-22 DIAGNOSIS — F17210 Nicotine dependence, cigarettes, uncomplicated: Secondary | ICD-10-CM | POA: Diagnosis not present

## 2023-01-22 DIAGNOSIS — R7303 Prediabetes: Secondary | ICD-10-CM | POA: Diagnosis not present

## 2023-01-22 DIAGNOSIS — L03116 Cellulitis of left lower limb: Secondary | ICD-10-CM | POA: Diagnosis not present

## 2023-01-22 DIAGNOSIS — I5030 Unspecified diastolic (congestive) heart failure: Secondary | ICD-10-CM | POA: Diagnosis not present

## 2023-01-22 DIAGNOSIS — E785 Hyperlipidemia, unspecified: Secondary | ICD-10-CM | POA: Diagnosis not present

## 2023-01-24 ENCOUNTER — Other Ambulatory Visit: Payer: Self-pay | Admitting: Family Medicine

## 2023-01-25 DIAGNOSIS — Z993 Dependence on wheelchair: Secondary | ICD-10-CM | POA: Diagnosis not present

## 2023-01-25 DIAGNOSIS — M109 Gout, unspecified: Secondary | ICD-10-CM | POA: Diagnosis not present

## 2023-01-25 DIAGNOSIS — I5030 Unspecified diastolic (congestive) heart failure: Secondary | ICD-10-CM | POA: Diagnosis not present

## 2023-01-25 DIAGNOSIS — F17220 Nicotine dependence, chewing tobacco, uncomplicated: Secondary | ICD-10-CM | POA: Diagnosis not present

## 2023-01-25 DIAGNOSIS — E785 Hyperlipidemia, unspecified: Secondary | ICD-10-CM | POA: Diagnosis not present

## 2023-01-25 DIAGNOSIS — F17210 Nicotine dependence, cigarettes, uncomplicated: Secondary | ICD-10-CM | POA: Diagnosis not present

## 2023-01-25 DIAGNOSIS — G309 Alzheimer's disease, unspecified: Secondary | ICD-10-CM | POA: Diagnosis not present

## 2023-01-25 DIAGNOSIS — Z4781 Encounter for orthopedic aftercare following surgical amputation: Secondary | ICD-10-CM | POA: Diagnosis not present

## 2023-01-25 DIAGNOSIS — F0280 Dementia in other diseases classified elsewhere without behavioral disturbance: Secondary | ICD-10-CM | POA: Diagnosis not present

## 2023-01-25 DIAGNOSIS — J449 Chronic obstructive pulmonary disease, unspecified: Secondary | ICD-10-CM | POA: Diagnosis not present

## 2023-01-25 DIAGNOSIS — Z89422 Acquired absence of other left toe(s): Secondary | ICD-10-CM | POA: Diagnosis not present

## 2023-01-25 DIAGNOSIS — G629 Polyneuropathy, unspecified: Secondary | ICD-10-CM | POA: Diagnosis not present

## 2023-01-25 DIAGNOSIS — R7303 Prediabetes: Secondary | ICD-10-CM | POA: Diagnosis not present

## 2023-01-25 DIAGNOSIS — L03116 Cellulitis of left lower limb: Secondary | ICD-10-CM | POA: Diagnosis not present

## 2023-01-25 DIAGNOSIS — M86672 Other chronic osteomyelitis, left ankle and foot: Secondary | ICD-10-CM | POA: Diagnosis not present

## 2023-01-25 DIAGNOSIS — N400 Enlarged prostate without lower urinary tract symptoms: Secondary | ICD-10-CM | POA: Diagnosis not present

## 2023-01-28 DIAGNOSIS — Z993 Dependence on wheelchair: Secondary | ICD-10-CM | POA: Diagnosis not present

## 2023-01-28 DIAGNOSIS — G309 Alzheimer's disease, unspecified: Secondary | ICD-10-CM | POA: Diagnosis not present

## 2023-01-28 DIAGNOSIS — F17210 Nicotine dependence, cigarettes, uncomplicated: Secondary | ICD-10-CM | POA: Diagnosis not present

## 2023-01-28 DIAGNOSIS — G629 Polyneuropathy, unspecified: Secondary | ICD-10-CM | POA: Diagnosis not present

## 2023-01-28 DIAGNOSIS — R7303 Prediabetes: Secondary | ICD-10-CM | POA: Diagnosis not present

## 2023-01-28 DIAGNOSIS — F17220 Nicotine dependence, chewing tobacco, uncomplicated: Secondary | ICD-10-CM | POA: Diagnosis not present

## 2023-01-28 DIAGNOSIS — I5030 Unspecified diastolic (congestive) heart failure: Secondary | ICD-10-CM | POA: Diagnosis not present

## 2023-01-28 DIAGNOSIS — M109 Gout, unspecified: Secondary | ICD-10-CM | POA: Diagnosis not present

## 2023-01-28 DIAGNOSIS — M86672 Other chronic osteomyelitis, left ankle and foot: Secondary | ICD-10-CM | POA: Diagnosis not present

## 2023-01-28 DIAGNOSIS — L03116 Cellulitis of left lower limb: Secondary | ICD-10-CM | POA: Diagnosis not present

## 2023-01-28 DIAGNOSIS — F0280 Dementia in other diseases classified elsewhere without behavioral disturbance: Secondary | ICD-10-CM | POA: Diagnosis not present

## 2023-01-28 DIAGNOSIS — J449 Chronic obstructive pulmonary disease, unspecified: Secondary | ICD-10-CM | POA: Diagnosis not present

## 2023-01-28 DIAGNOSIS — Z89422 Acquired absence of other left toe(s): Secondary | ICD-10-CM | POA: Diagnosis not present

## 2023-01-28 DIAGNOSIS — Z4781 Encounter for orthopedic aftercare following surgical amputation: Secondary | ICD-10-CM | POA: Diagnosis not present

## 2023-01-28 DIAGNOSIS — E785 Hyperlipidemia, unspecified: Secondary | ICD-10-CM | POA: Diagnosis not present

## 2023-01-28 DIAGNOSIS — N400 Enlarged prostate without lower urinary tract symptoms: Secondary | ICD-10-CM | POA: Diagnosis not present

## 2023-01-30 DIAGNOSIS — M109 Gout, unspecified: Secondary | ICD-10-CM | POA: Diagnosis not present

## 2023-01-30 DIAGNOSIS — G629 Polyneuropathy, unspecified: Secondary | ICD-10-CM | POA: Diagnosis not present

## 2023-01-30 DIAGNOSIS — Z89422 Acquired absence of other left toe(s): Secondary | ICD-10-CM | POA: Diagnosis not present

## 2023-01-30 DIAGNOSIS — M86672 Other chronic osteomyelitis, left ankle and foot: Secondary | ICD-10-CM | POA: Diagnosis not present

## 2023-01-30 DIAGNOSIS — Z4781 Encounter for orthopedic aftercare following surgical amputation: Secondary | ICD-10-CM | POA: Diagnosis not present

## 2023-01-30 DIAGNOSIS — N400 Enlarged prostate without lower urinary tract symptoms: Secondary | ICD-10-CM | POA: Diagnosis not present

## 2023-01-30 DIAGNOSIS — J449 Chronic obstructive pulmonary disease, unspecified: Secondary | ICD-10-CM | POA: Diagnosis not present

## 2023-01-30 DIAGNOSIS — Z993 Dependence on wheelchair: Secondary | ICD-10-CM | POA: Diagnosis not present

## 2023-01-30 DIAGNOSIS — L03116 Cellulitis of left lower limb: Secondary | ICD-10-CM | POA: Diagnosis not present

## 2023-01-30 DIAGNOSIS — F17220 Nicotine dependence, chewing tobacco, uncomplicated: Secondary | ICD-10-CM | POA: Diagnosis not present

## 2023-01-30 DIAGNOSIS — G309 Alzheimer's disease, unspecified: Secondary | ICD-10-CM | POA: Diagnosis not present

## 2023-01-30 DIAGNOSIS — E785 Hyperlipidemia, unspecified: Secondary | ICD-10-CM | POA: Diagnosis not present

## 2023-01-30 DIAGNOSIS — I5030 Unspecified diastolic (congestive) heart failure: Secondary | ICD-10-CM | POA: Diagnosis not present

## 2023-01-30 DIAGNOSIS — R7303 Prediabetes: Secondary | ICD-10-CM | POA: Diagnosis not present

## 2023-01-30 DIAGNOSIS — F0280 Dementia in other diseases classified elsewhere without behavioral disturbance: Secondary | ICD-10-CM | POA: Diagnosis not present

## 2023-01-30 DIAGNOSIS — F17210 Nicotine dependence, cigarettes, uncomplicated: Secondary | ICD-10-CM | POA: Diagnosis not present

## 2023-01-31 ENCOUNTER — Other Ambulatory Visit: Payer: Self-pay | Admitting: Family Medicine

## 2023-01-31 DIAGNOSIS — J441 Chronic obstructive pulmonary disease with (acute) exacerbation: Secondary | ICD-10-CM

## 2023-02-05 ENCOUNTER — Other Ambulatory Visit: Payer: Self-pay | Admitting: Family Medicine

## 2023-02-05 DIAGNOSIS — I5030 Unspecified diastolic (congestive) heart failure: Secondary | ICD-10-CM | POA: Diagnosis not present

## 2023-02-05 DIAGNOSIS — Z7951 Long term (current) use of inhaled steroids: Secondary | ICD-10-CM | POA: Diagnosis not present

## 2023-02-05 DIAGNOSIS — M86672 Other chronic osteomyelitis, left ankle and foot: Secondary | ICD-10-CM | POA: Diagnosis not present

## 2023-02-05 DIAGNOSIS — F17220 Nicotine dependence, chewing tobacco, uncomplicated: Secondary | ICD-10-CM | POA: Diagnosis not present

## 2023-02-05 DIAGNOSIS — J449 Chronic obstructive pulmonary disease, unspecified: Secondary | ICD-10-CM | POA: Diagnosis not present

## 2023-02-05 DIAGNOSIS — Z9181 History of falling: Secondary | ICD-10-CM | POA: Diagnosis not present

## 2023-02-05 DIAGNOSIS — Z993 Dependence on wheelchair: Secondary | ICD-10-CM | POA: Diagnosis not present

## 2023-02-05 DIAGNOSIS — Z9981 Dependence on supplemental oxygen: Secondary | ICD-10-CM | POA: Diagnosis not present

## 2023-02-05 DIAGNOSIS — Z89422 Acquired absence of other left toe(s): Secondary | ICD-10-CM | POA: Diagnosis not present

## 2023-02-05 DIAGNOSIS — N400 Enlarged prostate without lower urinary tract symptoms: Secondary | ICD-10-CM | POA: Diagnosis not present

## 2023-02-05 DIAGNOSIS — Z79899 Other long term (current) drug therapy: Secondary | ICD-10-CM | POA: Diagnosis not present

## 2023-02-05 DIAGNOSIS — E785 Hyperlipidemia, unspecified: Secondary | ICD-10-CM | POA: Diagnosis not present

## 2023-02-05 DIAGNOSIS — G309 Alzheimer's disease, unspecified: Secondary | ICD-10-CM | POA: Diagnosis not present

## 2023-02-05 DIAGNOSIS — L03116 Cellulitis of left lower limb: Secondary | ICD-10-CM | POA: Diagnosis not present

## 2023-02-05 DIAGNOSIS — G629 Polyneuropathy, unspecified: Secondary | ICD-10-CM | POA: Diagnosis not present

## 2023-02-05 DIAGNOSIS — M109 Gout, unspecified: Secondary | ICD-10-CM | POA: Diagnosis not present

## 2023-02-05 DIAGNOSIS — Z4781 Encounter for orthopedic aftercare following surgical amputation: Secondary | ICD-10-CM | POA: Diagnosis not present

## 2023-02-05 DIAGNOSIS — F0280 Dementia in other diseases classified elsewhere without behavioral disturbance: Secondary | ICD-10-CM | POA: Diagnosis not present

## 2023-02-05 DIAGNOSIS — F17210 Nicotine dependence, cigarettes, uncomplicated: Secondary | ICD-10-CM | POA: Diagnosis not present

## 2023-02-05 DIAGNOSIS — R7303 Prediabetes: Secondary | ICD-10-CM | POA: Diagnosis not present

## 2023-02-05 MED ORDER — TRELEGY ELLIPTA 200-62.5-25 MCG/ACT IN AEPB: 1.0000 | INHALATION_SPRAY | Freq: Every day | RESPIRATORY_TRACT | 3 refills | Status: DC

## 2023-02-06 DIAGNOSIS — G309 Alzheimer's disease, unspecified: Secondary | ICD-10-CM | POA: Diagnosis not present

## 2023-02-06 DIAGNOSIS — F17210 Nicotine dependence, cigarettes, uncomplicated: Secondary | ICD-10-CM | POA: Diagnosis not present

## 2023-02-06 DIAGNOSIS — N400 Enlarged prostate without lower urinary tract symptoms: Secondary | ICD-10-CM | POA: Diagnosis not present

## 2023-02-06 DIAGNOSIS — Z4781 Encounter for orthopedic aftercare following surgical amputation: Secondary | ICD-10-CM | POA: Diagnosis not present

## 2023-02-06 DIAGNOSIS — E785 Hyperlipidemia, unspecified: Secondary | ICD-10-CM | POA: Diagnosis not present

## 2023-02-06 DIAGNOSIS — F0280 Dementia in other diseases classified elsewhere without behavioral disturbance: Secondary | ICD-10-CM | POA: Diagnosis not present

## 2023-02-06 DIAGNOSIS — F17220 Nicotine dependence, chewing tobacco, uncomplicated: Secondary | ICD-10-CM | POA: Diagnosis not present

## 2023-02-06 DIAGNOSIS — J449 Chronic obstructive pulmonary disease, unspecified: Secondary | ICD-10-CM | POA: Diagnosis not present

## 2023-02-06 DIAGNOSIS — R7303 Prediabetes: Secondary | ICD-10-CM | POA: Diagnosis not present

## 2023-02-06 DIAGNOSIS — Z89422 Acquired absence of other left toe(s): Secondary | ICD-10-CM | POA: Diagnosis not present

## 2023-02-06 DIAGNOSIS — I5030 Unspecified diastolic (congestive) heart failure: Secondary | ICD-10-CM | POA: Diagnosis not present

## 2023-02-06 DIAGNOSIS — Z993 Dependence on wheelchair: Secondary | ICD-10-CM | POA: Diagnosis not present

## 2023-02-06 DIAGNOSIS — M86672 Other chronic osteomyelitis, left ankle and foot: Secondary | ICD-10-CM | POA: Diagnosis not present

## 2023-02-06 DIAGNOSIS — G629 Polyneuropathy, unspecified: Secondary | ICD-10-CM | POA: Diagnosis not present

## 2023-02-06 DIAGNOSIS — L03116 Cellulitis of left lower limb: Secondary | ICD-10-CM | POA: Diagnosis not present

## 2023-02-06 DIAGNOSIS — M109 Gout, unspecified: Secondary | ICD-10-CM | POA: Diagnosis not present

## 2023-02-13 ENCOUNTER — Other Ambulatory Visit: Payer: Self-pay | Admitting: Family Medicine

## 2023-02-13 DIAGNOSIS — M1A09X0 Idiopathic chronic gout, multiple sites, without tophus (tophi): Secondary | ICD-10-CM

## 2023-02-14 DIAGNOSIS — F0280 Dementia in other diseases classified elsewhere without behavioral disturbance: Secondary | ICD-10-CM | POA: Diagnosis not present

## 2023-02-14 DIAGNOSIS — E785 Hyperlipidemia, unspecified: Secondary | ICD-10-CM | POA: Diagnosis not present

## 2023-02-14 DIAGNOSIS — R7303 Prediabetes: Secondary | ICD-10-CM | POA: Diagnosis not present

## 2023-02-14 DIAGNOSIS — J449 Chronic obstructive pulmonary disease, unspecified: Secondary | ICD-10-CM | POA: Diagnosis not present

## 2023-02-14 DIAGNOSIS — F17210 Nicotine dependence, cigarettes, uncomplicated: Secondary | ICD-10-CM | POA: Diagnosis not present

## 2023-02-14 DIAGNOSIS — I5030 Unspecified diastolic (congestive) heart failure: Secondary | ICD-10-CM | POA: Diagnosis not present

## 2023-02-14 DIAGNOSIS — G629 Polyneuropathy, unspecified: Secondary | ICD-10-CM | POA: Diagnosis not present

## 2023-02-14 DIAGNOSIS — Z4781 Encounter for orthopedic aftercare following surgical amputation: Secondary | ICD-10-CM | POA: Diagnosis not present

## 2023-02-14 DIAGNOSIS — M109 Gout, unspecified: Secondary | ICD-10-CM | POA: Diagnosis not present

## 2023-02-14 DIAGNOSIS — N400 Enlarged prostate without lower urinary tract symptoms: Secondary | ICD-10-CM | POA: Diagnosis not present

## 2023-02-14 DIAGNOSIS — Z993 Dependence on wheelchair: Secondary | ICD-10-CM | POA: Diagnosis not present

## 2023-02-14 DIAGNOSIS — G309 Alzheimer's disease, unspecified: Secondary | ICD-10-CM | POA: Diagnosis not present

## 2023-02-14 DIAGNOSIS — L03116 Cellulitis of left lower limb: Secondary | ICD-10-CM | POA: Diagnosis not present

## 2023-02-14 DIAGNOSIS — F17220 Nicotine dependence, chewing tobacco, uncomplicated: Secondary | ICD-10-CM | POA: Diagnosis not present

## 2023-02-14 DIAGNOSIS — M86672 Other chronic osteomyelitis, left ankle and foot: Secondary | ICD-10-CM | POA: Diagnosis not present

## 2023-02-14 DIAGNOSIS — Z89422 Acquired absence of other left toe(s): Secondary | ICD-10-CM | POA: Diagnosis not present

## 2023-02-23 DIAGNOSIS — G309 Alzheimer's disease, unspecified: Secondary | ICD-10-CM | POA: Diagnosis not present

## 2023-02-23 DIAGNOSIS — G629 Polyneuropathy, unspecified: Secondary | ICD-10-CM | POA: Diagnosis not present

## 2023-02-23 DIAGNOSIS — F17210 Nicotine dependence, cigarettes, uncomplicated: Secondary | ICD-10-CM | POA: Diagnosis not present

## 2023-02-23 DIAGNOSIS — E785 Hyperlipidemia, unspecified: Secondary | ICD-10-CM | POA: Diagnosis not present

## 2023-02-23 DIAGNOSIS — F17220 Nicotine dependence, chewing tobacco, uncomplicated: Secondary | ICD-10-CM | POA: Diagnosis not present

## 2023-02-23 DIAGNOSIS — Z4781 Encounter for orthopedic aftercare following surgical amputation: Secondary | ICD-10-CM | POA: Diagnosis not present

## 2023-02-23 DIAGNOSIS — N400 Enlarged prostate without lower urinary tract symptoms: Secondary | ICD-10-CM | POA: Diagnosis not present

## 2023-02-23 DIAGNOSIS — Z89422 Acquired absence of other left toe(s): Secondary | ICD-10-CM | POA: Diagnosis not present

## 2023-02-23 DIAGNOSIS — I5030 Unspecified diastolic (congestive) heart failure: Secondary | ICD-10-CM | POA: Diagnosis not present

## 2023-02-23 DIAGNOSIS — L03116 Cellulitis of left lower limb: Secondary | ICD-10-CM | POA: Diagnosis not present

## 2023-02-23 DIAGNOSIS — M86672 Other chronic osteomyelitis, left ankle and foot: Secondary | ICD-10-CM | POA: Diagnosis not present

## 2023-02-23 DIAGNOSIS — Z993 Dependence on wheelchair: Secondary | ICD-10-CM | POA: Diagnosis not present

## 2023-02-23 DIAGNOSIS — M109 Gout, unspecified: Secondary | ICD-10-CM | POA: Diagnosis not present

## 2023-02-23 DIAGNOSIS — F0280 Dementia in other diseases classified elsewhere without behavioral disturbance: Secondary | ICD-10-CM | POA: Diagnosis not present

## 2023-02-23 DIAGNOSIS — R7303 Prediabetes: Secondary | ICD-10-CM | POA: Diagnosis not present

## 2023-02-23 DIAGNOSIS — J449 Chronic obstructive pulmonary disease, unspecified: Secondary | ICD-10-CM | POA: Diagnosis not present

## 2023-02-25 DIAGNOSIS — M86672 Other chronic osteomyelitis, left ankle and foot: Secondary | ICD-10-CM | POA: Diagnosis not present

## 2023-02-25 DIAGNOSIS — Z4781 Encounter for orthopedic aftercare following surgical amputation: Secondary | ICD-10-CM | POA: Diagnosis not present

## 2023-02-25 DIAGNOSIS — F17210 Nicotine dependence, cigarettes, uncomplicated: Secondary | ICD-10-CM | POA: Diagnosis not present

## 2023-02-25 DIAGNOSIS — F0280 Dementia in other diseases classified elsewhere without behavioral disturbance: Secondary | ICD-10-CM | POA: Diagnosis not present

## 2023-02-25 DIAGNOSIS — Z89422 Acquired absence of other left toe(s): Secondary | ICD-10-CM | POA: Diagnosis not present

## 2023-02-25 DIAGNOSIS — L03116 Cellulitis of left lower limb: Secondary | ICD-10-CM | POA: Diagnosis not present

## 2023-02-25 DIAGNOSIS — N400 Enlarged prostate without lower urinary tract symptoms: Secondary | ICD-10-CM | POA: Diagnosis not present

## 2023-02-25 DIAGNOSIS — I5030 Unspecified diastolic (congestive) heart failure: Secondary | ICD-10-CM | POA: Diagnosis not present

## 2023-02-25 DIAGNOSIS — G629 Polyneuropathy, unspecified: Secondary | ICD-10-CM | POA: Diagnosis not present

## 2023-02-25 DIAGNOSIS — M109 Gout, unspecified: Secondary | ICD-10-CM | POA: Diagnosis not present

## 2023-02-25 DIAGNOSIS — G309 Alzheimer's disease, unspecified: Secondary | ICD-10-CM | POA: Diagnosis not present

## 2023-02-25 DIAGNOSIS — E785 Hyperlipidemia, unspecified: Secondary | ICD-10-CM | POA: Diagnosis not present

## 2023-02-25 DIAGNOSIS — J449 Chronic obstructive pulmonary disease, unspecified: Secondary | ICD-10-CM | POA: Diagnosis not present

## 2023-02-25 DIAGNOSIS — F17220 Nicotine dependence, chewing tobacco, uncomplicated: Secondary | ICD-10-CM | POA: Diagnosis not present

## 2023-02-25 DIAGNOSIS — Z993 Dependence on wheelchair: Secondary | ICD-10-CM | POA: Diagnosis not present

## 2023-02-25 DIAGNOSIS — R7303 Prediabetes: Secondary | ICD-10-CM | POA: Diagnosis not present

## 2023-02-27 ENCOUNTER — Telehealth: Payer: Self-pay | Admitting: Family Medicine

## 2023-02-27 ENCOUNTER — Ambulatory Visit: Payer: Medicare Other | Admitting: Family Medicine

## 2023-02-27 NOTE — Telephone Encounter (Signed)
TRIED CALLING PT WITH CONCERNS ABOUT MISSING HIS APPT TODAY. VOICEMAIL IS FULL. I CALLED DAUGHTER JULIE SHE SAID HE TURNS HIS PHONE ON VIBRATED AND FORGETS TO TURN IT BACK ON LOUD. I STATED TO JULIE THAT THE PT HAS BEEN TOLD HE IS UP FOR POSSIBLE DISCHARGE FOR NO SHOWS THAT WE ARE TRYING TO FOLLOW UP WITH HIM.

## 2023-02-28 ENCOUNTER — Other Ambulatory Visit: Payer: Self-pay | Admitting: Family Medicine

## 2023-03-01 NOTE — Telephone Encounter (Signed)
Patient called and made an appointment, explained to patient that it is important he does not miss this appointment due to his no show he is at risk for discharge. Patient verbalized understanding. Also called daughter and left her a voicemail to make her aware of appointment, so she will be able to remind patient .

## 2023-03-04 ENCOUNTER — Telehealth: Payer: Self-pay

## 2023-03-04 DIAGNOSIS — F17210 Nicotine dependence, cigarettes, uncomplicated: Secondary | ICD-10-CM | POA: Diagnosis not present

## 2023-03-04 DIAGNOSIS — N400 Enlarged prostate without lower urinary tract symptoms: Secondary | ICD-10-CM | POA: Diagnosis not present

## 2023-03-04 DIAGNOSIS — G629 Polyneuropathy, unspecified: Secondary | ICD-10-CM | POA: Diagnosis not present

## 2023-03-04 DIAGNOSIS — Z993 Dependence on wheelchair: Secondary | ICD-10-CM | POA: Diagnosis not present

## 2023-03-04 DIAGNOSIS — I5030 Unspecified diastolic (congestive) heart failure: Secondary | ICD-10-CM | POA: Diagnosis not present

## 2023-03-04 DIAGNOSIS — Z89422 Acquired absence of other left toe(s): Secondary | ICD-10-CM | POA: Diagnosis not present

## 2023-03-04 DIAGNOSIS — M86672 Other chronic osteomyelitis, left ankle and foot: Secondary | ICD-10-CM | POA: Diagnosis not present

## 2023-03-04 DIAGNOSIS — F17220 Nicotine dependence, chewing tobacco, uncomplicated: Secondary | ICD-10-CM | POA: Diagnosis not present

## 2023-03-04 DIAGNOSIS — G309 Alzheimer's disease, unspecified: Secondary | ICD-10-CM | POA: Diagnosis not present

## 2023-03-04 DIAGNOSIS — E785 Hyperlipidemia, unspecified: Secondary | ICD-10-CM | POA: Diagnosis not present

## 2023-03-04 DIAGNOSIS — Z4781 Encounter for orthopedic aftercare following surgical amputation: Secondary | ICD-10-CM | POA: Diagnosis not present

## 2023-03-04 DIAGNOSIS — J449 Chronic obstructive pulmonary disease, unspecified: Secondary | ICD-10-CM | POA: Diagnosis not present

## 2023-03-04 DIAGNOSIS — L03116 Cellulitis of left lower limb: Secondary | ICD-10-CM | POA: Diagnosis not present

## 2023-03-04 DIAGNOSIS — F0280 Dementia in other diseases classified elsewhere without behavioral disturbance: Secondary | ICD-10-CM | POA: Diagnosis not present

## 2023-03-04 DIAGNOSIS — R7303 Prediabetes: Secondary | ICD-10-CM | POA: Diagnosis not present

## 2023-03-04 DIAGNOSIS — M109 Gout, unspecified: Secondary | ICD-10-CM | POA: Diagnosis not present

## 2023-03-04 NOTE — Telephone Encounter (Signed)
PT Shawn Osborn called to get verbal orders to extend therapy and continue PT with Shawn Osborn.  Verbal orders given.

## 2023-03-07 DIAGNOSIS — Z89422 Acquired absence of other left toe(s): Secondary | ICD-10-CM | POA: Diagnosis not present

## 2023-03-07 DIAGNOSIS — F0280 Dementia in other diseases classified elsewhere without behavioral disturbance: Secondary | ICD-10-CM | POA: Diagnosis not present

## 2023-03-07 DIAGNOSIS — Z4781 Encounter for orthopedic aftercare following surgical amputation: Secondary | ICD-10-CM | POA: Diagnosis not present

## 2023-03-07 DIAGNOSIS — Z7951 Long term (current) use of inhaled steroids: Secondary | ICD-10-CM | POA: Diagnosis not present

## 2023-03-07 DIAGNOSIS — M109 Gout, unspecified: Secondary | ICD-10-CM | POA: Diagnosis not present

## 2023-03-07 DIAGNOSIS — E785 Hyperlipidemia, unspecified: Secondary | ICD-10-CM | POA: Diagnosis not present

## 2023-03-07 DIAGNOSIS — G629 Polyneuropathy, unspecified: Secondary | ICD-10-CM | POA: Diagnosis not present

## 2023-03-07 DIAGNOSIS — I5030 Unspecified diastolic (congestive) heart failure: Secondary | ICD-10-CM | POA: Diagnosis not present

## 2023-03-07 DIAGNOSIS — F17210 Nicotine dependence, cigarettes, uncomplicated: Secondary | ICD-10-CM | POA: Diagnosis not present

## 2023-03-07 DIAGNOSIS — R7303 Prediabetes: Secondary | ICD-10-CM | POA: Diagnosis not present

## 2023-03-07 DIAGNOSIS — N400 Enlarged prostate without lower urinary tract symptoms: Secondary | ICD-10-CM | POA: Diagnosis not present

## 2023-03-07 DIAGNOSIS — G309 Alzheimer's disease, unspecified: Secondary | ICD-10-CM | POA: Diagnosis not present

## 2023-03-07 DIAGNOSIS — Z79899 Other long term (current) drug therapy: Secondary | ICD-10-CM | POA: Diagnosis not present

## 2023-03-07 DIAGNOSIS — J449 Chronic obstructive pulmonary disease, unspecified: Secondary | ICD-10-CM | POA: Diagnosis not present

## 2023-03-07 DIAGNOSIS — Z9981 Dependence on supplemental oxygen: Secondary | ICD-10-CM | POA: Diagnosis not present

## 2023-03-11 DIAGNOSIS — N400 Enlarged prostate without lower urinary tract symptoms: Secondary | ICD-10-CM

## 2023-03-11 DIAGNOSIS — M109 Gout, unspecified: Secondary | ICD-10-CM

## 2023-03-11 DIAGNOSIS — Z7951 Long term (current) use of inhaled steroids: Secondary | ICD-10-CM | POA: Diagnosis not present

## 2023-03-11 DIAGNOSIS — E785 Hyperlipidemia, unspecified: Secondary | ICD-10-CM

## 2023-03-11 DIAGNOSIS — G629 Polyneuropathy, unspecified: Secondary | ICD-10-CM

## 2023-03-11 DIAGNOSIS — Z89422 Acquired absence of other left toe(s): Secondary | ICD-10-CM | POA: Diagnosis not present

## 2023-03-11 DIAGNOSIS — J449 Chronic obstructive pulmonary disease, unspecified: Secondary | ICD-10-CM

## 2023-03-11 DIAGNOSIS — Z9981 Dependence on supplemental oxygen: Secondary | ICD-10-CM | POA: Diagnosis not present

## 2023-03-11 DIAGNOSIS — I5030 Unspecified diastolic (congestive) heart failure: Secondary | ICD-10-CM

## 2023-03-11 DIAGNOSIS — R7303 Prediabetes: Secondary | ICD-10-CM

## 2023-03-11 DIAGNOSIS — Z4781 Encounter for orthopedic aftercare following surgical amputation: Secondary | ICD-10-CM | POA: Diagnosis not present

## 2023-03-11 DIAGNOSIS — F17210 Nicotine dependence, cigarettes, uncomplicated: Secondary | ICD-10-CM

## 2023-03-11 DIAGNOSIS — G309 Alzheimer's disease, unspecified: Secondary | ICD-10-CM

## 2023-03-11 DIAGNOSIS — F0280 Dementia in other diseases classified elsewhere without behavioral disturbance: Secondary | ICD-10-CM

## 2023-03-13 ENCOUNTER — Other Ambulatory Visit: Payer: Self-pay | Admitting: Family Medicine

## 2023-03-23 DIAGNOSIS — J449 Chronic obstructive pulmonary disease, unspecified: Secondary | ICD-10-CM | POA: Diagnosis not present

## 2023-03-23 DIAGNOSIS — E785 Hyperlipidemia, unspecified: Secondary | ICD-10-CM | POA: Diagnosis not present

## 2023-03-23 DIAGNOSIS — M109 Gout, unspecified: Secondary | ICD-10-CM | POA: Diagnosis not present

## 2023-03-23 DIAGNOSIS — G629 Polyneuropathy, unspecified: Secondary | ICD-10-CM | POA: Diagnosis not present

## 2023-03-23 DIAGNOSIS — Z4781 Encounter for orthopedic aftercare following surgical amputation: Secondary | ICD-10-CM | POA: Diagnosis not present

## 2023-03-23 DIAGNOSIS — G309 Alzheimer's disease, unspecified: Secondary | ICD-10-CM | POA: Diagnosis not present

## 2023-03-23 DIAGNOSIS — Z7951 Long term (current) use of inhaled steroids: Secondary | ICD-10-CM | POA: Diagnosis not present

## 2023-03-23 DIAGNOSIS — Z79899 Other long term (current) drug therapy: Secondary | ICD-10-CM | POA: Diagnosis not present

## 2023-03-23 DIAGNOSIS — Z9981 Dependence on supplemental oxygen: Secondary | ICD-10-CM | POA: Diagnosis not present

## 2023-03-23 DIAGNOSIS — I5030 Unspecified diastolic (congestive) heart failure: Secondary | ICD-10-CM | POA: Diagnosis not present

## 2023-03-23 DIAGNOSIS — F0280 Dementia in other diseases classified elsewhere without behavioral disturbance: Secondary | ICD-10-CM | POA: Diagnosis not present

## 2023-03-23 DIAGNOSIS — N400 Enlarged prostate without lower urinary tract symptoms: Secondary | ICD-10-CM | POA: Diagnosis not present

## 2023-03-23 DIAGNOSIS — F17210 Nicotine dependence, cigarettes, uncomplicated: Secondary | ICD-10-CM | POA: Diagnosis not present

## 2023-03-23 DIAGNOSIS — R7303 Prediabetes: Secondary | ICD-10-CM | POA: Diagnosis not present

## 2023-03-23 DIAGNOSIS — Z89422 Acquired absence of other left toe(s): Secondary | ICD-10-CM | POA: Diagnosis not present

## 2023-03-25 ENCOUNTER — Telehealth: Payer: Self-pay

## 2023-03-25 ENCOUNTER — Other Ambulatory Visit: Payer: Self-pay | Admitting: Family Medicine

## 2023-03-25 DIAGNOSIS — R351 Nocturia: Secondary | ICD-10-CM

## 2023-03-25 NOTE — Telephone Encounter (Signed)
Home physical therapy called requesting an order for Nursing evaluation for wounds LLE.  Order given per Dr. Sedalia Muta.

## 2023-03-26 DIAGNOSIS — J449 Chronic obstructive pulmonary disease, unspecified: Secondary | ICD-10-CM | POA: Diagnosis not present

## 2023-03-26 DIAGNOSIS — E785 Hyperlipidemia, unspecified: Secondary | ICD-10-CM | POA: Diagnosis not present

## 2023-03-26 DIAGNOSIS — Z89422 Acquired absence of other left toe(s): Secondary | ICD-10-CM | POA: Diagnosis not present

## 2023-03-26 DIAGNOSIS — Z9981 Dependence on supplemental oxygen: Secondary | ICD-10-CM | POA: Diagnosis not present

## 2023-03-26 DIAGNOSIS — Z79899 Other long term (current) drug therapy: Secondary | ICD-10-CM | POA: Diagnosis not present

## 2023-03-26 DIAGNOSIS — F17210 Nicotine dependence, cigarettes, uncomplicated: Secondary | ICD-10-CM | POA: Diagnosis not present

## 2023-03-26 DIAGNOSIS — G309 Alzheimer's disease, unspecified: Secondary | ICD-10-CM | POA: Diagnosis not present

## 2023-03-26 DIAGNOSIS — I5030 Unspecified diastolic (congestive) heart failure: Secondary | ICD-10-CM | POA: Diagnosis not present

## 2023-03-26 DIAGNOSIS — G629 Polyneuropathy, unspecified: Secondary | ICD-10-CM | POA: Diagnosis not present

## 2023-03-26 DIAGNOSIS — N400 Enlarged prostate without lower urinary tract symptoms: Secondary | ICD-10-CM | POA: Diagnosis not present

## 2023-03-26 DIAGNOSIS — M109 Gout, unspecified: Secondary | ICD-10-CM | POA: Diagnosis not present

## 2023-03-26 DIAGNOSIS — Z7951 Long term (current) use of inhaled steroids: Secondary | ICD-10-CM | POA: Diagnosis not present

## 2023-03-26 DIAGNOSIS — F0280 Dementia in other diseases classified elsewhere without behavioral disturbance: Secondary | ICD-10-CM | POA: Diagnosis not present

## 2023-03-26 DIAGNOSIS — R7303 Prediabetes: Secondary | ICD-10-CM | POA: Diagnosis not present

## 2023-03-26 DIAGNOSIS — Z4781 Encounter for orthopedic aftercare following surgical amputation: Secondary | ICD-10-CM | POA: Diagnosis not present

## 2023-03-27 ENCOUNTER — Ambulatory Visit (INDEPENDENT_AMBULATORY_CARE_PROVIDER_SITE_OTHER): Payer: Medicare Other | Admitting: Family Medicine

## 2023-03-27 ENCOUNTER — Encounter: Payer: Self-pay | Admitting: Family Medicine

## 2023-03-27 VITALS — BP 120/80 | HR 54 | Temp 96.7°F | Ht 71.0 in | Wt 225.0 lb

## 2023-03-27 DIAGNOSIS — G894 Chronic pain syndrome: Secondary | ICD-10-CM

## 2023-03-27 DIAGNOSIS — J449 Chronic obstructive pulmonary disease, unspecified: Secondary | ICD-10-CM | POA: Diagnosis not present

## 2023-03-27 DIAGNOSIS — S81801A Unspecified open wound, right lower leg, initial encounter: Secondary | ICD-10-CM

## 2023-03-27 DIAGNOSIS — J9611 Chronic respiratory failure with hypoxia: Secondary | ICD-10-CM

## 2023-03-27 DIAGNOSIS — R8290 Unspecified abnormal findings in urine: Secondary | ICD-10-CM

## 2023-03-27 DIAGNOSIS — I110 Hypertensive heart disease with heart failure: Secondary | ICD-10-CM

## 2023-03-27 DIAGNOSIS — E6609 Other obesity due to excess calories: Secondary | ICD-10-CM

## 2023-03-27 DIAGNOSIS — I5042 Chronic combined systolic (congestive) and diastolic (congestive) heart failure: Secondary | ICD-10-CM | POA: Diagnosis not present

## 2023-03-27 DIAGNOSIS — R7303 Prediabetes: Secondary | ICD-10-CM

## 2023-03-27 DIAGNOSIS — F1120 Opioid dependence, uncomplicated: Secondary | ICD-10-CM

## 2023-03-27 DIAGNOSIS — J441 Chronic obstructive pulmonary disease with (acute) exacerbation: Secondary | ICD-10-CM

## 2023-03-27 DIAGNOSIS — G2581 Restless legs syndrome: Secondary | ICD-10-CM

## 2023-03-27 DIAGNOSIS — L97221 Non-pressure chronic ulcer of left calf limited to breakdown of skin: Secondary | ICD-10-CM

## 2023-03-27 DIAGNOSIS — E782 Mixed hyperlipidemia: Secondary | ICD-10-CM

## 2023-03-27 DIAGNOSIS — G4733 Obstructive sleep apnea (adult) (pediatric): Secondary | ICD-10-CM

## 2023-03-27 DIAGNOSIS — F02B3 Dementia in other diseases classified elsewhere, moderate, with mood disturbance: Secondary | ICD-10-CM

## 2023-03-27 DIAGNOSIS — G300 Alzheimer's disease with early onset: Secondary | ICD-10-CM

## 2023-03-27 DIAGNOSIS — Z89422 Acquired absence of other left toe(s): Secondary | ICD-10-CM

## 2023-03-27 LAB — POCT URINALYSIS DIP (CLINITEK)
Bilirubin, UA: NEGATIVE
Blood, UA: NEGATIVE
Glucose, UA: NEGATIVE mg/dL
Ketones, POC UA: NEGATIVE mg/dL
Nitrite, UA: NEGATIVE
POC PROTEIN,UA: NEGATIVE
Spec Grav, UA: 1.025 (ref 1.010–1.025)
Urobilinogen, UA: 0.2 E.U./dL
pH, UA: 5.5 (ref 5.0–8.0)

## 2023-03-27 MED ORDER — TRIAMCINOLONE ACETONIDE 40 MG/ML IJ SUSP
80.0000 mg | Freq: Once | INTRAMUSCULAR | Status: AC
Start: 2023-03-27 — End: 2023-03-27
  Administered 2023-03-27: 80 mg via INTRAMUSCULAR

## 2023-03-27 MED ORDER — ROPINIROLE HCL 0.5 MG PO TABS
0.5000 mg | ORAL_TABLET | Freq: Every day | ORAL | 2 refills | Status: DC
Start: 2023-03-27 — End: 2023-05-20

## 2023-03-27 MED ORDER — IPRATROPIUM-ALBUTEROL 0.5-2.5 (3) MG/3ML IN SOLN: 3.0000 mL | RESPIRATORY_TRACT | 2 refills | Status: DC | PRN

## 2023-03-27 MED ORDER — CEFTRIAXONE SODIUM 1 G IJ SOLR
1.0000 g | Freq: Once | INTRAMUSCULAR | Status: AC
Start: 2023-03-27 — End: 2023-03-27
  Administered 2023-03-27: 1 g via INTRAMUSCULAR

## 2023-03-27 MED ORDER — PREDNISONE 10 MG PO TABS
ORAL_TABLET | ORAL | 0 refills | Status: AC
Start: 2023-03-27 — End: 2023-04-01

## 2023-03-27 MED ORDER — FUROSEMIDE 40 MG PO TABS
40.0000 mg | ORAL_TABLET | Freq: Every day | ORAL | 3 refills | Status: DC
Start: 2023-03-27 — End: 2023-06-10

## 2023-03-27 MED ORDER — TRELEGY ELLIPTA 100-62.5-25 MCG/ACT IN AEPB
1.0000 | INHALATION_SPRAY | Freq: Every day | RESPIRATORY_TRACT | 11 refills | Status: DC
Start: 2023-03-27 — End: 2023-05-20

## 2023-03-27 MED ORDER — CEFDINIR 300 MG PO CAPS
300.0000 mg | ORAL_CAPSULE | Freq: Two times a day (BID) | ORAL | 0 refills | Status: DC
Start: 2023-03-27 — End: 2023-04-04

## 2023-03-27 NOTE — Assessment & Plan Note (Addendum)
Fair control Continue aricept 10 mg before bed and memantine xr 28 mg daily

## 2023-03-27 NOTE — Progress Notes (Signed)
Subjective:  Patient ID: Shawn Osborn, male    DOB: 29-Sep-1951  Age: 72 y.o. MRN: [id number]  Chief Complaint  Patient presents with   Medical Management of Chronic Issues    HPI COPD: Patient is supposed to be on trelegy once daily, but he is out. On albuterol HFA 2 puffs four times a day as needed or nebulizer.  Chronic Pain syndrome: Lyrica 50 mg three times a day, and allopurinol 300 mg daily. Patient has chronic back pain, gout, knee pain.    Alzheimer's Dementia: aricept 10 mg before bed and Namenda xr 28 mg once daily.   Depression: zoloft 100 mg once daily. ON seroquel 50 mg before bed, rozerem 8 mg before bed, and hydroxyzine 50 mg before bed.   Hypertensive heart disease with CORONARY ARTERY DISEASE/CONGESTIVE HEART FAILURE: On propranolol ER 60 mg daily (also for tremor.), crestor 5 mg before bed, Lovaza 2 g BID  Vitamin D deficiency: on Vitamin D 50K weekly.   GERD: well controlled on omeprazole 40 mg daily.   BPH: on tamsulosin 0.4 mg before bed.   Neuropathy: on lyrica 50 mg three times a day.   Declined CT of Lungs and Colonoscopy. Declined Shingrix and Tdap     03/27/2023   11:25 AM 10/30/2022    4:14 PM 10/17/2022   10:53 AM 04/17/2022    1:57 PM 02/16/2022   11:21 AM  Depression screen PHQ 2/9  Decreased Interest 0 0 0 0 0  Down, Depressed, Hopeless 0 0 0 0 0  PHQ - 2 Score 0 0 0 0 0  Altered sleeping 0      Tired, decreased energy 2      Change in appetite 0      Feeling bad or failure about yourself  0      Trouble concentrating 0      Moving slowly or fidgety/restless 0      Suicidal thoughts 0      PHQ-9 Score 2      Difficult doing work/chores Not difficult at all            03/27/2023   11:25 AM  Fall Risk   Falls in the past year? 0  Number falls in past yr: 0  Injury with Fall? 0  Risk for fall due to : No Fall Risks  Follow up Falls evaluation completed    Patient Care Team: Blane Ohara, MD as PCP - General (Internal  Medicine) Thomasene Ripple, DO as PCP - Cardiology (Cardiology) Marlowe Sax, RN as Case Manager (General Practice)   Review of Systems  Constitutional:  Positive for fatigue. Negative for chills, diaphoresis and fever.  HENT:  Negative for congestion, ear pain and sore throat.   Respiratory:  Positive for shortness of breath. Negative for cough.   Cardiovascular:  Positive for leg swelling. Negative for chest pain.  Gastrointestinal:  Negative for abdominal pain, constipation, diarrhea, nausea and vomiting.  Genitourinary:  Positive for frequency. Negative for dysuria and urgency.  Musculoskeletal:  Positive for arthralgias and back pain. Negative for myalgias.  Neurological:  Negative for dizziness and headaches.  Psychiatric/Behavioral:  Negative for dysphoric mood.     Current Outpatient Medications on File Prior to Visit  Medication Sig Dispense Refill   albuterol (PROVENTIL) (2.5 MG/3ML) 0.083% nebulizer solution Take 3 mLs (2.5 mg total) by nebulization every 6 (six) hours as needed for wheezing or shortness of breath. 120 mL 12   allopurinol (ZYLOPRIM) 300 MG  tablet TAKE ONE TABLET BY MOUTH EVERY DAY at 1pm 30 tablet 2   donepezil (ARICEPT) 10 MG tablet TAKE ONE TABLET BY MOUTH EVERY EVENING 90 tablet 3   ferrous sulfate 324 MG TBEC Take 324 mg by mouth daily with breakfast. Take 1 every other day     Fluticasone-Umeclidin-Vilant (TRELEGY ELLIPTA) 200-62.5-25 MCG/ACT AEPB Inhale 1 Inhalation into the lungs daily. 60 each 3   hydrocortisone 2.5%-Eucerin equivalent 1:1 cream mixture Apply topically 2 (two) times daily. 120 g 0   hydrOXYzine (ATARAX) 50 MG tablet TAKE ONE TABLET BY MOUTH AT BEDTIME 30 tablet 1   memantine (NAMENDA XR) 28 MG CP24 24 hr capsule TAKE ONE CAPSULE BY MOUTH AT BEDTIME 90 capsule 0   omega-3 acid ethyl esters (LOVAZA) 1 g capsule TAKE 2 CAPSULES BY MOUTH TWICE DAILY 360 capsule 0   omeprazole (PRILOSEC) 40 MG capsule TAKE ONE CAPSULE BY MOUTH EVERY DAY 30  capsule 2   propranolol ER (INDERAL LA) 60 MG 24 hr capsule TAKE ONE CAPSULE BY MOUTH EVERY DAY 30 capsule 2   QUEtiapine (SEROQUEL) 50 MG tablet TAKE ONE TABLET BY MOUTH AT BEDTIME 30 tablet 2   ramelteon (ROZEREM) 8 MG tablet TAKE ONE TABLET BY MOUTH AT BEDTIME 30 tablet 11   rosuvastatin (CRESTOR) 5 MG tablet TAKE ONE TABLET BY MOUTH AT BEDTIME 30 tablet 2   sertraline (ZOLOFT) 100 MG tablet TAKE ONE TABLET BY MOUTH AT BEDTIME 30 tablet 2   tamsulosin (FLOMAX) 0.4 MG CAPS capsule TAKE 2 CAPSULES BY MOUTH EVERY EVENING 60 capsule 2   triamcinolone cream (KENALOG) 0.1 % Apply 1 application. topically 2 (two) times daily as needed (to ears for itching). 30 g 2   VENTOLIN HFA 108 (90 Base) MCG/ACT inhaler Inhale 2 puffs into the lungs every 6 hours as needed for wheezing or shortness of breath. 18 g 3   Vitamin D, Ergocalciferol, (DRISDOL) 1.25 MG (50000 UNIT) CAPS capsule TAKE ONE CAPSULE BY MOUTH EVERY WEEK 15 capsule 3   No current facility-administered medications on file prior to visit.   Past Medical History:  Diagnosis Date   Acute combined systolic and diastolic CHF, NYHA class 3 (HCC) 10/08/2019   Acute delirium 10/08/2019   Acute exacerbation of CHF (congestive heart failure) (HCC) 10/08/2019   Acute gout of multiple sites 07/01/2016   Acute respiratory failure with hypoxia and hypercapnia (HCC) 10/08/2019   Allergy    Anxiety    Arthritis    Gout, osteoarthritis in Back and Knees   Auditory hallucinations 05/01/2022   Cataract    CHF (congestive heart failure) (HCC)    Chronic bronchitis    Community acquired pneumonia 10/08/2019   COPD (chronic obstructive pulmonary disease) (HCC) 10/08/2019   Dementia (HCC) 10/08/2019   Elevated troponin 10/08/2019   Emphysema of lung (HCC)    Essential hypertension 07/03/2016   GERD (gastroesophageal reflux disease)    History of kidney stones    HLD (hyperlipidemia) 10/08/2019   Hypercapnic respiratory failure (HCC) 10/08/2019    Hyperlipidemia    Hypertension    Hypertensive heart disease 10/08/2019   Morbid obesity due to excess calories (HCC) 10/08/2019   Narcotic overdose (HCC) 10/08/2019   Neuropathy    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   NSTEMI (non-ST elevated myocardial infarction) (HCC) 10/08/2019   Obstructive sleep apnea    Occlusion and stenosis of carotid artery without mention of cerebral infarction 12/13/2011   OSA on CPAP 10/08/2019   Pain in  joint of left shoulder 01/12/2019   Paranoia (psychosis) (HCC) 07/01/2016   Poorly-controlled hypertension 10/08/2019   Prediabetes    Psychotic disorder with delusions (HCC) 07/01/2016   Respiratory failure with hypoxia and hypercapnia (HCC) 10/08/2019   Restless leg syndrome 07/03/2016   Rheumatoid arthritis(714.0)    RLS (restless legs syndrome)    Sepsis (HCC) 10/08/2019   Sleep apnea    Spinal stenosis    Syncope 04/03/2019   Tear of left rotator cuff 01/12/2019   Toxic metabolic encephalopathy 10/08/2019   UTI (urinary tract infection) 10/08/2019   Vascular dementia with behavioral disturbance (HCC) 07/06/2016   Visual hallucinations 05/01/2022   Past Surgical History:  Procedure Laterality Date   AMPUTATION TOE Left    APPENDECTOMY     CARPAL TUNNEL RELEASE     CORONARY PRESSURE/FFR STUDY N/A 10/20/2019   Procedure: INTRAVASCULAR PRESSURE WIRE/FFR STUDY;  Surgeon: Lyn Records, MD;  Location: [facility];  Service: Cardiovascular;  Laterality: N/A;   EYE SURGERY     LEFT HEART CATH AND CORONARY ANGIOGRAPHY N/A 10/20/2019   Procedure: LEFT HEART CATH AND CORONARY ANGIOGRAPHY;  Surgeon: Lyn Records, MD;  Location: [facility];  Service: Cardiovascular;  Laterality: N/A;   SPINE SURGERY  1998, 2003, 2012   Laminectomy X 3    Family History  Problem Relation Age of Onset   Heart disease Mother    Hypertension Mother    Social History   Socioeconomic History   Marital status: Widowed    Spouse name: Not on file    Number of children: 4   Years of education: Not on file   Highest education level: Not on file  Occupational History   Not on file  Tobacco Use   Smoking status: Every Day    Packs/day: 0.75    Years: 53.00    Additional pack years: 0.00    Total pack years: 39.75    Types: Cigarettes   Smokeless tobacco: Current    Types: Chew  Vaping Use   Vaping Use: Never used  Substance and Sexual Activity   Alcohol use: No   Drug use: No   Sexual activity: Not Currently  Other Topics Concern   Not on file  Social History Narrative   Not on file   Social Determinants of Health   Financial Resource Strain: Low Risk  (10/30/2022)   Overall Financial Resource Strain (CARDIA)    Difficulty of Paying Living Expenses: Not very hard  Food Insecurity: No Food Insecurity (10/30/2022)   Hunger Vital Sign    Worried About Running Out of Food in the Last Year: Never true    Ran Out of Food in the Last Year: Never true  Transportation Needs: No Transportation Needs (10/30/2022)   PRAPARE - Administrator, Civil Service (Medical): No    Lack of Transportation (Non-Medical): No  Physical Activity: Inactive (10/30/2022)   Exercise Vital Sign    Days of Exercise per Week: 0 days    Minutes of Exercise per Session: 0 min  Stress: No Stress Concern Present (10/30/2022)   [employer] of Occupational Health - Occupational Stress Questionnaire    Feeling of Stress : Not at all  Social Connections: Moderately Isolated (10/30/2022)   Social Connection and Isolation Panel [NHANES]    Frequency of Communication with Friends and Family: More than three times a week    Frequency of Social Gatherings with Friends and Family: More than three times a  week    Attends Religious Services: More than 4 times per year    Active Member of Clubs or Organizations: No    Attends Banker Meetings: Never    Marital Status: Widowed    Objective:  BP 120/80   Pulse (!) 54   Temp (!)  96.7 F (35.9 C)   Ht 5\' 11"  (1.803 m)   Wt 225 lb (102.1 kg) Comment: Per patient. Unable to weigh today.  SpO2 96%   BMI 31.38 kg/m      03/27/2023   11:17 AM 12/28/2022    8:35 AM 10/17/2022   10:47 AM  BP/Weight  Systolic BP 120 158 118  Diastolic BP 80 62 64  Wt. (Lbs) 225 297 287  BMI 31.38 kg/m2 42.62 kg/m2 41.18 kg/m2    Physical Exam Vitals reviewed.  Constitutional:      Comments: Disheveled. Urine odor.   Neck:     Vascular: No carotid bruit.  Cardiovascular:     Rate and Rhythm: Normal rate and regular rhythm.     Heart sounds: Normal heart sounds. No murmur heard. Pulmonary:     Effort: Pulmonary effort is normal.     Breath sounds: Wheezing present.  Abdominal:     General: Abdomen is flat. Bowel sounds are normal.     Palpations: Abdomen is soft.     Tenderness: There is no abdominal tenderness.  Skin:    Comments: Ulcer on left lateral leg 1.5" in diameter and 1" in diameter x 1 month  Neurological:     Mental Status: He is alert and oriented to person, place, and time.  Psychiatric:        Mood and Affect: Mood normal.        Behavior: Behavior normal.     Diabetic Foot Exam - Simple   No data filed      Lab Results  Component Value Date   WBC 11.4 (H) 03/27/2023   HGB 12.6 (L) 03/27/2023   HCT 41.7 03/27/2023   PLT 337 03/27/2023   GLUCOSE 84 03/27/2023   CHOL 143 03/27/2023   TRIG 140 03/27/2023   HDL 44 03/27/2023   LDLCALC 74 03/27/2023   ALT 9 03/27/2023   AST 10 03/27/2023   NA 147 (H) 03/27/2023   K 4.7 03/27/2023   CL 110 (H) 03/27/2023   CREATININE 0.62 (L) 03/27/2023   BUN 10 03/27/2023   CO2 22 03/27/2023   TSH 1.880 03/27/2023   INR 0.9 08/30/2020   HGBA1C 5.9 (H) 03/27/2023      Assessment & Plan:    Mixed hyperlipidemia Assessment & Plan: Well controlled.  No changes to medicines. Rosuvastatin 5 mg daily. Continue to work on eating a healthy diet and exercise.    Orders: -     Lipid panel  Hypertensive  heart disease with chronic combined systolic and diastolic congestive heart failure (HCC) Assessment & Plan: Management per specialist. Dr. Servando Salina Continue propranolol er 60 mg daily  Increase lasix 40 mg daily as needed     Orders: -     TSH  OSA on CPAP Assessment & Plan: Continue cpap with oxygen 2 L   Moderate early onset Alzheimer's dementia with mood disturbance (HCC) Assessment & Plan: Fair control Continue aricept 10 mg before bed and memantine xr 28 mg daily    Chronic pain syndrome Assessment & Plan: Poorly controlled, but has history of overuse. He does have real sources of pain, but he is high  risk for abuse/overuse.    Prediabetes Assessment & Plan: Hemoglobin A1c 5.8%, 3 month avg of blood sugars, is in prediabetic range.  In order to prevent progression to diabetes, recommend low carb diet and regular exercise   Orders: -     Hemoglobin A1c  Chronic combined systolic and diastolic congestive heart failure (HCC) Assessment & Plan: The current medical regimen has been fairly effective;  On propranolol ER 60 mg daily also for tremor.   Orders: -     CBC with Differential/Platelet -     Comprehensive metabolic panel -     Furosemide; Take 1 tablet (40 mg total) by mouth daily.  Dispense: 30 tablet; Refill: 3  COPD with acute exacerbation (HCC) -     Triamcinolone Acetonide -     cefTRIAXone Sodium -     predniSONE; Take 6 tablets (60 mg total) by mouth daily with breakfast for 1 day, THEN 5 tablets (50 mg total) daily with breakfast for 1 day, THEN 4 tablets (40 mg total) daily with breakfast for 1 day, THEN 3 tablets (30 mg total) daily with breakfast for 1 day, THEN 2 tablets (20 mg total) daily with breakfast for 1 day, THEN 1 tablet (10 mg total) daily with breakfast for 1 day.  Dispense: 21 tablet; Refill: 0 -     Cefdinir; Take 1 capsule (300 mg total) by mouth 2 (two) times daily.  Dispense: 20 capsule; Refill: 0 -     Trelegy Ellipta; Inhale 1 puff  into the lungs daily.  Dispense: 1 each; Refill: 11 -     Ipratropium-Albuterol; Take 3 mLs by nebulization every 4 (four) hours as needed.  Dispense: 360 mL; Refill: 2  H/O amputation of lesser toe, left (HCC)  Skin ulcer of left calf, limited to breakdown of skin (HCC) Assessment & Plan: BL unna boots applied in office Follow up 5/13 for removal.  Refer to wound care.    Orders: -     AMB referral to wound care center  Malodorous urine -     POCT URINALYSIS DIP (CLINITEK) -     Urine Culture  Class 1 obesity due to excess calories with serious comorbidity and body mass index (BMI) of 31.0 to 31.9 in adult Assessment & Plan: Recommend continue to work on eating healthy diet and exercise.    RLS (restless legs syndrome) Assessment & Plan: Start ropinerole 0.5 mg before bed.   Orders: -     rOPINIRole HCl; Take 1 tablet (0.5 mg total) by mouth at bedtime.  Dispense: 30 tablet; Refill: 2  Chronic respiratory failure with hypoxia (HCC) Assessment & Plan: Uses oxygen with cpap.    Open leg wound, right, initial encounter Assessment & Plan: Unna boot applied Increase lasix 40 mg daily.  Return Monday for unna boot removal.  Refer to wound care.   Orders: -     AMB referral to wound care center  Other orders -     Cardiovascular Risk Assessment     Meds ordered this encounter  Medications   triamcinolone acetonide (KENALOG-40) injection 80 mg   cefTRIAXone (ROCEPHIN) injection 1 g   predniSONE (DELTASONE) 10 MG tablet    Sig: Take 6 tablets (60 mg total) by mouth daily with breakfast for 1 day, THEN 5 tablets (50 mg total) daily with breakfast for 1 day, THEN 4 tablets (40 mg total) daily with breakfast for 1 day, THEN 3 tablets (30 mg total) daily with breakfast for 1 day,  THEN 2 tablets (20 mg total) daily with breakfast for 1 day, THEN 1 tablet (10 mg total) daily with breakfast for 1 day.    Dispense:  21 tablet    Refill:  0   cefdinir (OMNICEF) 300 MG  capsule    Sig: Take 1 capsule (300 mg total) by mouth 2 (two) times daily.    Dispense:  20 capsule    Refill:  0   Fluticasone-Umeclidin-Vilant (TRELEGY ELLIPTA) 100-62.5-25 MCG/ACT AEPB    Sig: Inhale 1 puff into the lungs daily.    Dispense:  1 each    Refill:  11   rOPINIRole (REQUIP) 0.5 MG tablet    Sig: Take 1 tablet (0.5 mg total) by mouth at bedtime.    Dispense:  30 tablet    Refill:  2   ipratropium-albuterol (DUONEB) 0.5-2.5 (3) MG/3ML SOLN    Sig: Take 3 mLs by nebulization every 4 (four) hours as needed.    Dispense:  360 mL    Refill:  2   furosemide (LASIX) 40 MG tablet    Sig: Take 1 tablet (40 mg total) by mouth daily.    Dispense:  30 tablet    Refill:  3    Orders Placed This Encounter  Procedures   Urine Culture   CBC with Differential/Platelet   Comprehensive metabolic panel   Hemoglobin A1c   Lipid panel   TSH   Cardiovascular Risk Assessment   AMB referral to wound care center   POCT URINALYSIS DIP (CLINITEK)     Follow-up: Return in about 5 days (around 04/01/2023) for  Nurse visit, lab visit.   I,Katherina A Bramblett,acting as a scribe for Blane Ohara, MD.,have documented all relevant documentation on the behalf of Blane Ohara, MD,as directed by  Blane Ohara, MD while in the presence of Blane Ohara, MD.   An After Visit Summary was printed and given to the patient.  I attest that I have reviewed this visit and agree with the plan scribed by my staff.   Blane Ohara, MD [facility] [phone]

## 2023-03-27 NOTE — Assessment & Plan Note (Signed)
Poorly controlled, but has history of overuse. He does have real sources of pain, but he is high risk for abuse/overuse.  

## 2023-03-27 NOTE — Assessment & Plan Note (Signed)
Well controlled.  No changes to medicines. Rosuvastatin 5 mg daily. Continue to work on eating a healthy diet and exercise.   

## 2023-03-27 NOTE — Assessment & Plan Note (Addendum)
BL unna boots applied in office Follow up 5/13 for removal.  Refer to wound care.

## 2023-03-27 NOTE — Assessment & Plan Note (Addendum)
The current medical regimen has been fairly effective;  On propranolol ER 60 mg daily also for tremor.

## 2023-03-27 NOTE — Patient Instructions (Addendum)
Restart Trelegy 1 inhalation daily. Start prednisone pack. Start antibiotic, cefdinir, 300 mg twice daily x 10 days.  Follow-up on Monday for legs. Since Lasix 40 mg daily.  Start on Mirapex 0.5 mg 1 p.o. nightly for restless legs.

## 2023-03-27 NOTE — Assessment & Plan Note (Signed)
Hemoglobin A1c 5.8%, 3 month avg of blood sugars, is in prediabetic range.  In order to prevent progression to diabetes, recommend low carb diet and regular exercise  

## 2023-03-27 NOTE — Assessment & Plan Note (Signed)
Continue cpap with oxygen 2 L 

## 2023-03-27 NOTE — Assessment & Plan Note (Addendum)
Management per specialist. Dr. Servando Salina Continue propranolol er 60 mg daily  Increase lasix 40 mg daily as needed

## 2023-03-28 ENCOUNTER — Other Ambulatory Visit: Payer: Self-pay | Admitting: Family Medicine

## 2023-03-28 LAB — HEMOGLOBIN A1C
Est. average glucose Bld gHb Est-mCnc: 123 mg/dL
Hgb A1c MFr Bld: 5.9 % — ABNORMAL HIGH (ref 4.8–5.6)

## 2023-03-28 LAB — LIPID PANEL
Chol/HDL Ratio: 3.3 ratio (ref 0.0–5.0)
Cholesterol, Total: 143 mg/dL (ref 100–199)
HDL: 44 mg/dL (ref >39)
LDL Chol Calc (NIH): 74 mg/dL (ref 0–99)
Triglycerides: 140 mg/dL (ref 0–149)
VLDL Cholesterol Cal: 25 mg/dL (ref 5–40)

## 2023-03-28 LAB — CBC WITH DIFFERENTIAL/PLATELET
Basophils Absolute: 0.1 10*3/uL (ref 0.0–0.2)
Basos: 1 %
EOS (ABSOLUTE): 0.3 10*3/uL (ref 0.0–0.4)
Eos: 3 %
Hematocrit: 41.7 % (ref 37.5–51.0)
Hemoglobin: 12.6 g/dL — ABNORMAL LOW (ref 13.0–17.7)
Immature Grans (Abs): 0 10*3/uL (ref 0.0–0.1)
Immature Granulocytes: 0 %
Lymphocytes Absolute: 2.9 10*3/uL (ref 0.7–3.1)
Lymphs: 25 %
MCH: 26.7 pg (ref 26.6–33.0)
MCHC: 30.2 g/dL — ABNORMAL LOW (ref 31.5–35.7)
MCV: 88 fL (ref 79–97)
Monocytes Absolute: 0.6 10*3/uL (ref 0.1–0.9)
Monocytes: 5 %
Neutrophils Absolute: 7.4 10*3/uL — ABNORMAL HIGH (ref 1.4–7.0)
Neutrophils: 66 %
Platelets: 337 10*3/uL (ref 150–450)
RBC: 4.72 x10E6/uL (ref 4.14–5.80)
RDW: 15.8 % — ABNORMAL HIGH (ref 11.6–15.4)
WBC: 11.4 10*3/uL — ABNORMAL HIGH (ref 3.4–10.8)

## 2023-03-28 LAB — COMPREHENSIVE METABOLIC PANEL
ALT: 9 IU/L (ref 0–44)
AST: 10 IU/L (ref 0–40)
Albumin/Globulin Ratio: 1.6 (ref 1.2–2.2)
Albumin: 3.9 g/dL (ref 3.8–4.8)
Alkaline Phosphatase: 99 IU/L (ref 44–121)
BUN/Creatinine Ratio: 16 (ref 10–24)
BUN: 10 mg/dL (ref 8–27)
Bilirubin Total: <0.2 mg/dL (ref 0.0–1.2)
CO2: 22 mmol/L (ref 20–29)
Calcium: 8.7 mg/dL (ref 8.6–10.2)
Chloride: 110 mmol/L — ABNORMAL HIGH (ref 96–106)
Creatinine, Ser: 0.62 mg/dL — ABNORMAL LOW (ref 0.76–1.27)
Globulin, Total: 2.4 g/dL (ref 1.5–4.5)
Glucose: 84 mg/dL (ref 70–99)
Potassium: 4.7 mmol/L (ref 3.5–5.2)
Sodium: 147 mmol/L — ABNORMAL HIGH (ref 134–144)
Total Protein: 6.3 g/dL (ref 6.0–8.5)
eGFR: 102 mL/min/{1.73_m2} (ref >59)

## 2023-03-28 LAB — TSH: TSH: 1.88 u[IU]/mL (ref 0.450–4.500)

## 2023-03-28 LAB — CARDIOVASCULAR RISK ASSESSMENT

## 2023-03-31 ENCOUNTER — Encounter: Payer: Self-pay | Admitting: Family Medicine

## 2023-03-31 DIAGNOSIS — E6609 Other obesity due to excess calories: Secondary | ICD-10-CM | POA: Insufficient documentation

## 2023-03-31 DIAGNOSIS — Z89422 Acquired absence of other left toe(s): Secondary | ICD-10-CM | POA: Insufficient documentation

## 2023-03-31 NOTE — Assessment & Plan Note (Signed)
Recommend continue to work on eating healthy diet and exercise.  

## 2023-03-31 NOTE — Assessment & Plan Note (Signed)
Uses oxygen with cpap.

## 2023-03-31 NOTE — Assessment & Plan Note (Signed)
Unna boot applied Increase lasix 40 mg daily.  Return Monday for unna boot removal.  Refer to wound care.

## 2023-03-31 NOTE — Assessment & Plan Note (Signed)
Start ropinerole 0.5 mg before bed.

## 2023-04-01 ENCOUNTER — Telehealth: Payer: Self-pay

## 2023-04-01 ENCOUNTER — Other Ambulatory Visit (INDEPENDENT_AMBULATORY_CARE_PROVIDER_SITE_OTHER): Payer: Medicare Other

## 2023-04-01 ENCOUNTER — Other Ambulatory Visit: Payer: Self-pay | Admitting: Family Medicine

## 2023-04-01 DIAGNOSIS — L97221 Non-pressure chronic ulcer of left calf limited to breakdown of skin: Secondary | ICD-10-CM

## 2023-04-01 DIAGNOSIS — L97911 Non-pressure chronic ulcer of unspecified part of right lower leg limited to breakdown of skin: Secondary | ICD-10-CM

## 2023-04-01 MED ORDER — PREGABALIN 75 MG PO CAPS: 75.0000 mg | ORAL_CAPSULE | Freq: Three times a day (TID) | ORAL | 2 refills | Status: DC

## 2023-04-01 NOTE — Telephone Encounter (Signed)
[facility]: 86/57/8469 TO 05/05/2023

## 2023-04-01 NOTE — Progress Notes (Signed)
Patient presents today for BL unna boots to be removed, lower legs to be evaluated and unna boots possibly reapplied. Per Dr. Sedalia Muta reapplied BL unna boots, patient to follow up for removal and eval 04/05/23.

## 2023-04-02 LAB — URINE CULTURE

## 2023-04-04 ENCOUNTER — Other Ambulatory Visit: Payer: Self-pay

## 2023-04-04 MED ORDER — SULFAMETHOXAZOLE-TRIMETHOPRIM 800-160 MG PO TABS: 1.0000 | ORAL_TABLET | Freq: Two times a day (BID) | ORAL | 0 refills | Status: DC

## 2023-04-05 ENCOUNTER — Ambulatory Visit (INDEPENDENT_AMBULATORY_CARE_PROVIDER_SITE_OTHER): Payer: Medicare Other

## 2023-04-05 DIAGNOSIS — L97221 Non-pressure chronic ulcer of left calf limited to breakdown of skin: Secondary | ICD-10-CM | POA: Diagnosis not present

## 2023-04-05 NOTE — Progress Notes (Signed)
APPLIED UNNA BOOT TO LEFT LEG.

## 2023-04-06 DIAGNOSIS — E785 Hyperlipidemia, unspecified: Secondary | ICD-10-CM | POA: Diagnosis not present

## 2023-04-06 DIAGNOSIS — J449 Chronic obstructive pulmonary disease, unspecified: Secondary | ICD-10-CM | POA: Diagnosis not present

## 2023-04-06 DIAGNOSIS — G629 Polyneuropathy, unspecified: Secondary | ICD-10-CM | POA: Diagnosis not present

## 2023-04-06 DIAGNOSIS — G309 Alzheimer's disease, unspecified: Secondary | ICD-10-CM | POA: Diagnosis not present

## 2023-04-06 DIAGNOSIS — I5030 Unspecified diastolic (congestive) heart failure: Secondary | ICD-10-CM | POA: Diagnosis not present

## 2023-04-06 DIAGNOSIS — N400 Enlarged prostate without lower urinary tract symptoms: Secondary | ICD-10-CM | POA: Diagnosis not present

## 2023-04-06 DIAGNOSIS — R7303 Prediabetes: Secondary | ICD-10-CM | POA: Diagnosis not present

## 2023-04-06 DIAGNOSIS — F0280 Dementia in other diseases classified elsewhere without behavioral disturbance: Secondary | ICD-10-CM | POA: Diagnosis not present

## 2023-04-06 DIAGNOSIS — M109 Gout, unspecified: Secondary | ICD-10-CM | POA: Diagnosis not present

## 2023-04-06 DIAGNOSIS — Z9981 Dependence on supplemental oxygen: Secondary | ICD-10-CM | POA: Diagnosis not present

## 2023-04-06 DIAGNOSIS — Z7951 Long term (current) use of inhaled steroids: Secondary | ICD-10-CM | POA: Diagnosis not present

## 2023-04-06 DIAGNOSIS — Z4781 Encounter for orthopedic aftercare following surgical amputation: Secondary | ICD-10-CM | POA: Diagnosis not present

## 2023-04-06 DIAGNOSIS — Z89422 Acquired absence of other left toe(s): Secondary | ICD-10-CM | POA: Diagnosis not present

## 2023-04-06 DIAGNOSIS — Z79899 Other long term (current) drug therapy: Secondary | ICD-10-CM | POA: Diagnosis not present

## 2023-04-06 DIAGNOSIS — F17210 Nicotine dependence, cigarettes, uncomplicated: Secondary | ICD-10-CM | POA: Diagnosis not present

## 2023-04-08 ENCOUNTER — Ambulatory Visit (INDEPENDENT_AMBULATORY_CARE_PROVIDER_SITE_OTHER): Payer: Medicare Other | Admitting: Family Medicine

## 2023-04-08 ENCOUNTER — Encounter: Payer: Self-pay | Admitting: Family Medicine

## 2023-04-08 VITALS — BP 180/100 | HR 53 | Temp 96.0°F | Resp 18

## 2023-04-08 DIAGNOSIS — L97909 Non-pressure chronic ulcer of unspecified part of unspecified lower leg with unspecified severity: Secondary | ICD-10-CM

## 2023-04-08 DIAGNOSIS — R030 Elevated blood-pressure reading, without diagnosis of hypertension: Secondary | ICD-10-CM | POA: Insufficient documentation

## 2023-04-08 DIAGNOSIS — I872 Venous insufficiency (chronic) (peripheral): Secondary | ICD-10-CM | POA: Insufficient documentation

## 2023-04-08 NOTE — Assessment & Plan Note (Signed)
Patient has a history of hypertension but has not been on medicines for over a year.  He was actually having problems with hypotension.  Today's blood pressure is been 180/100.  We have been redressing his wound.  I would recommend he check his blood pressure daily at home and call back to let us know how they are doing

## 2023-04-08 NOTE — Progress Notes (Signed)
Acute Office Visit  Subjective:    Patient ID: Shawn Osborn, male    DOB: 07-06-1951, 72 y.o.   MRN: [id number]  Chief Complaint  Patient presents with   Venous Stasis Ulcer    HPI: Patient is in today for reassessing ulcerations of his leg.  Right leg had a small ulceration which has resolved.  Left leg has a large ulcer which is very shallow and then a smaller ulcer again fairly shallow posterior left leg.  Does have a significantly foul odor.  Denies fevers or chills.  Patient is currently taking an antibiotic, Bactrim DS 1 p.o. twice daily for 1 week.  Past Medical History:  Diagnosis Date   Acute combined systolic and diastolic CHF, NYHA class 3 (HCC) 10/08/2019   Acute delirium 10/08/2019   Acute exacerbation of CHF (congestive heart failure) (HCC) 10/08/2019   Acute gout of multiple sites 07/01/2016   Acute respiratory failure with hypoxia and hypercapnia (HCC) 10/08/2019   Allergy    Anxiety    Arthritis    Gout, osteoarthritis in Back and Knees   Auditory hallucinations 05/01/2022   Cataract    CHF (congestive heart failure) (HCC)    Chronic bronchitis    Community acquired pneumonia 10/08/2019   COPD (chronic obstructive pulmonary disease) (HCC) 10/08/2019   Dementia (HCC) 10/08/2019   Elevated troponin 10/08/2019   Emphysema of lung (HCC)    Essential hypertension 07/03/2016   GERD (gastroesophageal reflux disease)    History of kidney stones    HLD (hyperlipidemia) 10/08/2019   Hypercapnic respiratory failure (HCC) 10/08/2019   Hyperlipidemia    Hypertension    Hypertensive heart disease 10/08/2019   Morbid obesity due to excess calories (HCC) 10/08/2019   Narcotic overdose (HCC) 10/08/2019   Neuropathy    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   NSTEMI (non-ST elevated myocardial infarction) (HCC) 10/08/2019   Obstructive sleep apnea    Occlusion and stenosis of carotid artery without mention of cerebral infarction 12/13/2011   OSA on CPAP  10/08/2019   Pain in joint of left shoulder 01/12/2019   Paranoia (psychosis) (HCC) 07/01/2016   Poorly-controlled hypertension 10/08/2019   Prediabetes    Psychotic disorder with delusions (HCC) 07/01/2016   Respiratory failure with hypoxia and hypercapnia (HCC) 10/08/2019   Restless leg syndrome 07/03/2016   Rheumatoid arthritis(714.0)    RLS (restless legs syndrome)    Sepsis (HCC) 10/08/2019   Sleep apnea    Spinal stenosis    Syncope 04/03/2019   Tear of left rotator cuff 01/12/2019   Toxic metabolic encephalopathy 10/08/2019   UTI (urinary tract infection) 10/08/2019   Vascular dementia with behavioral disturbance (HCC) 07/06/2016   Visual hallucinations 05/01/2022    Past Surgical History:  Procedure Laterality Date   AMPUTATION TOE Left    APPENDECTOMY     CARPAL TUNNEL RELEASE     CORONARY PRESSURE/FFR STUDY N/A 10/20/2019   Procedure: INTRAVASCULAR PRESSURE WIRE/FFR STUDY;  Surgeon: Lyn Records, MD;  Location: [facility];  Service: Cardiovascular;  Laterality: N/A;   EYE SURGERY     LEFT HEART CATH AND CORONARY ANGIOGRAPHY N/A 10/20/2019   Procedure: LEFT HEART CATH AND CORONARY ANGIOGRAPHY;  Surgeon: Lyn Records, MD;  Location: [facility];  Service: Cardiovascular;  Laterality: N/A;   SPINE SURGERY  1998, 2003, 2012   Laminectomy X 3    Family History  Problem Relation Age of Onset   Heart disease Mother  Hypertension Mother     Social History   Socioeconomic History   Marital status: Widowed    Spouse name: Not on file   Number of children: 4   Years of education: Not on file   Highest education level: Not on file  Occupational History   Not on file  Tobacco Use   Smoking status: Every Day    Packs/day: 0.75    Years: 53.00    Additional pack years: 0.00    Total pack years: 39.75    Types: Cigarettes   Smokeless tobacco: Current    Types: Chew  Vaping Use   Vaping Use: Never used  Substance and Sexual Activity   Alcohol use:  No   Drug use: No   Sexual activity: Not Currently  Other Topics Concern   Not on file  Social History Narrative   Not on file   Social Determinants of Health   Financial Resource Strain: Low Risk  (10/30/2022)   Overall Financial Resource Strain (CARDIA)    Difficulty of Paying Living Expenses: Not very hard  Food Insecurity: No Food Insecurity (10/30/2022)   Hunger Vital Sign    Worried About Running Out of Food in the Last Year: Never true    Ran Out of Food in the Last Year: Never true  Transportation Needs: No Transportation Needs (10/30/2022)   PRAPARE - Administrator, Civil Service (Medical): No    Lack of Transportation (Non-Medical): No  Physical Activity: Inactive (10/30/2022)   Exercise Vital Sign    Days of Exercise per Week: 0 days    Minutes of Exercise per Session: 0 min  Stress: No Stress Concern Present (10/30/2022)   [employer] of Occupational Health - Occupational Stress Questionnaire    Feeling of Stress : Not at all  Social Connections: Moderately Isolated (10/30/2022)   Social Connection and Isolation Panel [NHANES]    Frequency of Communication with Friends and Family: More than three times a week    Frequency of Social Gatherings with Friends and Family: More than three times a week    Attends Religious Services: More than 4 times per year    Active Member of [employer] or Organizations: No    Attends Banker Meetings: Never    Marital Status: Widowed  Intimate Partner Violence: Not At Risk (10/30/2022)   Humiliation, Afraid, Rape, and Kick questionnaire    Fear of Current or Ex-Partner: No    Emotionally Abused: No    Physically Abused: No    Sexually Abused: No    Outpatient Medications Prior to Visit  Medication Sig Dispense Refill   albuterol (PROVENTIL) (2.5 MG/3ML) 0.083% nebulizer solution Take 3 mLs (2.5 mg total) by nebulization every 6 (six) hours as needed for wheezing or shortness of breath. 120 mL 12    allopurinol (ZYLOPRIM) 300 MG tablet TAKE ONE TABLET BY MOUTH EVERY DAY at 1pm 30 tablet 2   donepezil (ARICEPT) 10 MG tablet TAKE ONE TABLET BY MOUTH EVERY EVENING 90 tablet 3   ferrous sulfate 324 MG TBEC Take 324 mg by mouth daily with breakfast. Take 1 every other day     Fluticasone-Umeclidin-Vilant (TRELEGY ELLIPTA) 100-62.5-25 MCG/ACT AEPB Inhale 1 puff into the lungs daily. 1 each 11   Fluticasone-Umeclidin-Vilant (TRELEGY ELLIPTA) 200-62.5-25 MCG/ACT AEPB Inhale 1 Inhalation into the lungs daily. 60 each 3   furosemide (LASIX) 40 MG tablet Take 1 tablet (40 mg total) by mouth daily. 30 tablet 3   hydrocortisone  2.5%-Eucerin equivalent 1:1 cream mixture Apply topically 2 (two) times daily. 120 g 0   hydrOXYzine (ATARAX) 50 MG tablet TAKE ONE TABLET BY MOUTH AT BEDTIME 30 tablet 1   ipratropium-albuterol (DUONEB) 0.5-2.5 (3) MG/3ML SOLN Take 3 mLs by nebulization every 4 (four) hours as needed. 360 mL 2   memantine (NAMENDA XR) 28 MG CP24 24 hr capsule TAKE ONE CAPSULE BY MOUTH AT BEDTIME 90 capsule 0   omega-3 acid ethyl esters (LOVAZA) 1 g capsule TAKE 2 CAPSULES BY MOUTH TWICE DAILY 360 capsule 0   omeprazole (PRILOSEC) 40 MG capsule TAKE ONE CAPSULE BY MOUTH EVERY DAY 30 capsule 2   pregabalin (LYRICA) 75 MG capsule Take 1 capsule (75 mg total) by mouth 3 (three) times daily. 90 capsule 2   propranolol ER (INDERAL LA) 60 MG 24 hr capsule TAKE ONE CAPSULE BY MOUTH EVERY DAY 30 capsule 2   QUEtiapine (SEROQUEL) 50 MG tablet TAKE ONE TABLET BY MOUTH AT BEDTIME 30 tablet 2   ramelteon (ROZEREM) 8 MG tablet TAKE ONE TABLET BY MOUTH AT BEDTIME 30 tablet 11   rOPINIRole (REQUIP) 0.5 MG tablet Take 1 tablet (0.5 mg total) by mouth at bedtime. 30 tablet 2   rosuvastatin (CRESTOR) 5 MG tablet TAKE ONE TABLET BY MOUTH AT BEDTIME 30 tablet 2   sertraline (ZOLOFT) 100 MG tablet TAKE ONE TABLET BY MOUTH AT BEDTIME 30 tablet 2   sulfamethoxazole-trimethoprim (BACTRIM DS) 800-160 MG tablet Take 1  tablet by mouth 2 (two) times daily. 14 tablet 0   tamsulosin (FLOMAX) 0.4 MG CAPS capsule TAKE 2 CAPSULES BY MOUTH EVERY EVENING 60 capsule 2   triamcinolone cream (KENALOG) 0.1 % Apply 1 application. topically 2 (two) times daily as needed (to ears for itching). 30 g 2   VENTOLIN HFA 108 (90 Base) MCG/ACT inhaler Inhale 2 puffs into the lungs every 6 hours as needed for wheezing or shortness of breath. 18 g 3   Vitamin D, Ergocalciferol, (DRISDOL) 1.25 MG (50000 UNIT) CAPS capsule TAKE ONE CAPSULE BY MOUTH EVERY WEEK 15 capsule 3   No facility-administered medications prior to visit.    Allergies  Allergen Reactions   Avelox [Moxifloxacin Hcl In Nacl] Swelling   Codeine Phosphate Itching   Colesevelam Other (See Comments)   Cozaar     Unknown    Cymbalta [Duloxetine Hcl]     Twitching   Ezetimibe Nausea Only   Methocarbamol Other (See Comments)   Ropinirole     Weakness, dizziness, felt sick   Statins     myalgia   Welchol [Colesevelam Hcl]    Penicillins Rash    Did it involve swelling of the face/tongue/throat, SOB, or low BP? No Did it involve sudden or severe rash/hives, skin peeling, or any reaction on the inside of your mouth or nose? Yes Did you need to seek medical attention at a hospital or doctor's office? Yes When did it last happen?      15 + years If all above answers are "NO", may proceed with cephalosporin use.     Review of Systems  Constitutional:  Negative for chills and fever.       Objective:        04/08/2023    3:11 PM 03/27/2023   11:17 AM 12/28/2022    8:35 AM  Vitals with BMI  Height  5\' 11"  5\' 10"   Weight  225 lbs 297 lbs  BMI  31.39 42.61  Systolic 180 120 161  Diastolic 100  80 62  Pulse 53 54 57    No data found.   Physical Exam Vitals reviewed.  Constitutional:      Appearance: Normal appearance. He is obese.  Skin:    Findings: Lesion (Large, shallow odorous ulcer on posterior left leg,  1/2 size of larger ulcer shallow odor.)  present.  Neurological:     Mental Status: He is alert.     Health Maintenance Due  Topic Date Due   Hepatitis C Screening  Never done   Medicare Annual Wellness (AWV)  04/18/2023    There are no preventive care reminders to display for this patient.   Lab Results  Component Value Date   TSH 1.880 03/27/2023   Lab Results  Component Value Date   WBC 11.4 (H) 03/27/2023   HGB 12.6 (L) 03/27/2023   HCT 41.7 03/27/2023   MCV 88 03/27/2023   PLT 337 03/27/2023   Lab Results  Component Value Date   NA 147 (H) 03/27/2023   K 4.7 03/27/2023   CO2 22 03/27/2023   GLUCOSE 84 03/27/2023   BUN 10 03/27/2023   CREATININE 0.62 (L) 03/27/2023   BILITOT <0.2 03/27/2023   ALKPHOS 99 03/27/2023   AST 10 03/27/2023   ALT 9 03/27/2023   PROT 6.3 03/27/2023   ALBUMIN 3.9 03/27/2023   CALCIUM 8.7 03/27/2023   ANIONGAP 11 09/01/2020   EGFR 102 03/27/2023   Lab Results  Component Value Date   CHOL 143 03/27/2023   Lab Results  Component Value Date   HDL 44 03/27/2023   Lab Results  Component Value Date   LDLCALC 74 03/27/2023   Lab Results  Component Value Date   TRIG 140 03/27/2023   Lab Results  Component Value Date   CHOLHDL 3.3 03/27/2023   Lab Results  Component Value Date   HGBA1C 5.9 (H) 03/27/2023       Assessment & Plan:  Venous ulcer of lower leg without varicose veins (HCC) Assessment & Plan: Refer to wound care center  Wrap loosely, rather than a full unna boot.   Orders: -     AMB referral to wound care center  Elevated BP without diagnosis of hypertension Assessment & Plan: Patient has a history of hypertension but has not been on medicines for over a year.  He was actually having problems with hypotension.  Today's blood pressure is been 180/100.  We have been redressing his wound.  I would recommend he check his blood pressure daily at home and call back to let us know how they are doing      No orders of the defined types were placed in  this encounter.   Orders Placed This Encounter  Procedures   AMB referral to wound care center     Follow-up: No follow-ups on file.  An After Visit Summary was printed and given to the patient.  Blane Ohara, MD [facility] [phone]

## 2023-04-08 NOTE — Patient Instructions (Signed)
Check blood pressure daily.  Let us know how they are running after approximately a week. Referring to wound care center.  If patient is not seen there in the next 4 days, he will need to return on Friday to have his Unna boot removed. Complete Bactrim (antibiotic)

## 2023-04-08 NOTE — Assessment & Plan Note (Signed)
Refer to wound care center  Wrap loosely, rather than a full unna boot.

## 2023-04-09 DIAGNOSIS — Z89422 Acquired absence of other left toe(s): Secondary | ICD-10-CM | POA: Diagnosis not present

## 2023-04-09 DIAGNOSIS — F0280 Dementia in other diseases classified elsewhere without behavioral disturbance: Secondary | ICD-10-CM | POA: Diagnosis not present

## 2023-04-09 DIAGNOSIS — G629 Polyneuropathy, unspecified: Secondary | ICD-10-CM | POA: Diagnosis not present

## 2023-04-09 DIAGNOSIS — Z9981 Dependence on supplemental oxygen: Secondary | ICD-10-CM | POA: Diagnosis not present

## 2023-04-09 DIAGNOSIS — Z4781 Encounter for orthopedic aftercare following surgical amputation: Secondary | ICD-10-CM | POA: Diagnosis not present

## 2023-04-09 DIAGNOSIS — Z79899 Other long term (current) drug therapy: Secondary | ICD-10-CM | POA: Diagnosis not present

## 2023-04-09 DIAGNOSIS — R7303 Prediabetes: Secondary | ICD-10-CM | POA: Diagnosis not present

## 2023-04-09 DIAGNOSIS — N400 Enlarged prostate without lower urinary tract symptoms: Secondary | ICD-10-CM | POA: Diagnosis not present

## 2023-04-09 DIAGNOSIS — M109 Gout, unspecified: Secondary | ICD-10-CM | POA: Diagnosis not present

## 2023-04-09 DIAGNOSIS — J449 Chronic obstructive pulmonary disease, unspecified: Secondary | ICD-10-CM | POA: Diagnosis not present

## 2023-04-09 DIAGNOSIS — E785 Hyperlipidemia, unspecified: Secondary | ICD-10-CM | POA: Diagnosis not present

## 2023-04-09 DIAGNOSIS — F17210 Nicotine dependence, cigarettes, uncomplicated: Secondary | ICD-10-CM | POA: Diagnosis not present

## 2023-04-09 DIAGNOSIS — I5030 Unspecified diastolic (congestive) heart failure: Secondary | ICD-10-CM | POA: Diagnosis not present

## 2023-04-09 DIAGNOSIS — Z7951 Long term (current) use of inhaled steroids: Secondary | ICD-10-CM | POA: Diagnosis not present

## 2023-04-09 DIAGNOSIS — G309 Alzheimer's disease, unspecified: Secondary | ICD-10-CM | POA: Diagnosis not present

## 2023-04-10 ENCOUNTER — Other Ambulatory Visit: Payer: Self-pay | Admitting: Family Medicine

## 2023-04-10 DIAGNOSIS — I5042 Chronic combined systolic (congestive) and diastolic (congestive) heart failure: Secondary | ICD-10-CM

## 2023-04-11 ENCOUNTER — Telehealth: Payer: Self-pay

## 2023-04-11 NOTE — Telephone Encounter (Signed)
[employer] medicare part D called to inform us that patient's Hydroxyzine has been approved for 1 year starting 04/11/23 to 04/10/24.

## 2023-04-12 ENCOUNTER — Telehealth: Payer: Self-pay

## 2023-04-12 NOTE — Telephone Encounter (Signed)
error 

## 2023-04-18 DIAGNOSIS — I83029 Varicose veins of left lower extremity with ulcer of unspecified site: Secondary | ICD-10-CM | POA: Diagnosis not present

## 2023-04-18 DIAGNOSIS — L97929 Non-pressure chronic ulcer of unspecified part of left lower leg with unspecified severity: Secondary | ICD-10-CM | POA: Diagnosis not present

## 2023-04-18 DIAGNOSIS — L89893 Pressure ulcer of other site, stage 3: Secondary | ICD-10-CM | POA: Diagnosis not present

## 2023-04-18 DIAGNOSIS — I872 Venous insufficiency (chronic) (peripheral): Secondary | ICD-10-CM | POA: Diagnosis not present

## 2023-04-18 DIAGNOSIS — I890 Lymphedema, not elsewhere classified: Secondary | ICD-10-CM | POA: Diagnosis not present

## 2023-04-20 DIAGNOSIS — Z89422 Acquired absence of other left toe(s): Secondary | ICD-10-CM | POA: Diagnosis not present

## 2023-04-20 DIAGNOSIS — Z7951 Long term (current) use of inhaled steroids: Secondary | ICD-10-CM | POA: Diagnosis not present

## 2023-04-20 DIAGNOSIS — I5030 Unspecified diastolic (congestive) heart failure: Secondary | ICD-10-CM | POA: Diagnosis not present

## 2023-04-20 DIAGNOSIS — J449 Chronic obstructive pulmonary disease, unspecified: Secondary | ICD-10-CM | POA: Diagnosis not present

## 2023-04-20 DIAGNOSIS — R7303 Prediabetes: Secondary | ICD-10-CM | POA: Diagnosis not present

## 2023-04-20 DIAGNOSIS — Z4781 Encounter for orthopedic aftercare following surgical amputation: Secondary | ICD-10-CM | POA: Diagnosis not present

## 2023-04-20 DIAGNOSIS — G629 Polyneuropathy, unspecified: Secondary | ICD-10-CM | POA: Diagnosis not present

## 2023-04-20 DIAGNOSIS — F17210 Nicotine dependence, cigarettes, uncomplicated: Secondary | ICD-10-CM | POA: Diagnosis not present

## 2023-04-20 DIAGNOSIS — Z9981 Dependence on supplemental oxygen: Secondary | ICD-10-CM | POA: Diagnosis not present

## 2023-04-20 DIAGNOSIS — F0280 Dementia in other diseases classified elsewhere without behavioral disturbance: Secondary | ICD-10-CM | POA: Diagnosis not present

## 2023-04-20 DIAGNOSIS — E785 Hyperlipidemia, unspecified: Secondary | ICD-10-CM | POA: Diagnosis not present

## 2023-04-20 DIAGNOSIS — N400 Enlarged prostate without lower urinary tract symptoms: Secondary | ICD-10-CM | POA: Diagnosis not present

## 2023-04-20 DIAGNOSIS — Z79899 Other long term (current) drug therapy: Secondary | ICD-10-CM | POA: Diagnosis not present

## 2023-04-20 DIAGNOSIS — G309 Alzheimer's disease, unspecified: Secondary | ICD-10-CM | POA: Diagnosis not present

## 2023-04-20 DIAGNOSIS — M109 Gout, unspecified: Secondary | ICD-10-CM | POA: Diagnosis not present

## 2023-04-20 NOTE — Addendum Note (Signed)
Addended byBlane Ohara on: 04/20/2023 07:18 PM   Modules accepted: Level of Service

## 2023-04-26 DIAGNOSIS — I872 Venous insufficiency (chronic) (peripheral): Secondary | ICD-10-CM | POA: Diagnosis not present

## 2023-04-26 DIAGNOSIS — L89893 Pressure ulcer of other site, stage 3: Secondary | ICD-10-CM | POA: Diagnosis not present

## 2023-04-26 DIAGNOSIS — I890 Lymphedema, not elsewhere classified: Secondary | ICD-10-CM | POA: Diagnosis not present

## 2023-04-26 DIAGNOSIS — L97929 Non-pressure chronic ulcer of unspecified part of left lower leg with unspecified severity: Secondary | ICD-10-CM | POA: Diagnosis not present

## 2023-04-26 DIAGNOSIS — I83029 Varicose veins of left lower extremity with ulcer of unspecified site: Secondary | ICD-10-CM | POA: Diagnosis not present

## 2023-04-27 DIAGNOSIS — J449 Chronic obstructive pulmonary disease, unspecified: Secondary | ICD-10-CM | POA: Diagnosis not present

## 2023-05-01 DIAGNOSIS — Z9981 Dependence on supplemental oxygen: Secondary | ICD-10-CM | POA: Diagnosis not present

## 2023-05-01 DIAGNOSIS — M109 Gout, unspecified: Secondary | ICD-10-CM | POA: Diagnosis not present

## 2023-05-01 DIAGNOSIS — I5030 Unspecified diastolic (congestive) heart failure: Secondary | ICD-10-CM | POA: Diagnosis not present

## 2023-05-01 DIAGNOSIS — G629 Polyneuropathy, unspecified: Secondary | ICD-10-CM | POA: Diagnosis not present

## 2023-05-01 DIAGNOSIS — Z4781 Encounter for orthopedic aftercare following surgical amputation: Secondary | ICD-10-CM | POA: Diagnosis not present

## 2023-05-01 DIAGNOSIS — Z7951 Long term (current) use of inhaled steroids: Secondary | ICD-10-CM | POA: Diagnosis not present

## 2023-05-01 DIAGNOSIS — F17210 Nicotine dependence, cigarettes, uncomplicated: Secondary | ICD-10-CM | POA: Diagnosis not present

## 2023-05-01 DIAGNOSIS — Z89422 Acquired absence of other left toe(s): Secondary | ICD-10-CM | POA: Diagnosis not present

## 2023-05-01 DIAGNOSIS — N400 Enlarged prostate without lower urinary tract symptoms: Secondary | ICD-10-CM | POA: Diagnosis not present

## 2023-05-01 DIAGNOSIS — R7303 Prediabetes: Secondary | ICD-10-CM | POA: Diagnosis not present

## 2023-05-01 DIAGNOSIS — G309 Alzheimer's disease, unspecified: Secondary | ICD-10-CM | POA: Diagnosis not present

## 2023-05-01 DIAGNOSIS — E785 Hyperlipidemia, unspecified: Secondary | ICD-10-CM | POA: Diagnosis not present

## 2023-05-01 DIAGNOSIS — Z79899 Other long term (current) drug therapy: Secondary | ICD-10-CM | POA: Diagnosis not present

## 2023-05-01 DIAGNOSIS — F0280 Dementia in other diseases classified elsewhere without behavioral disturbance: Secondary | ICD-10-CM | POA: Diagnosis not present

## 2023-05-01 DIAGNOSIS — J449 Chronic obstructive pulmonary disease, unspecified: Secondary | ICD-10-CM | POA: Diagnosis not present

## 2023-05-06 DIAGNOSIS — F17210 Nicotine dependence, cigarettes, uncomplicated: Secondary | ICD-10-CM | POA: Diagnosis not present

## 2023-05-06 DIAGNOSIS — J449 Chronic obstructive pulmonary disease, unspecified: Secondary | ICD-10-CM | POA: Diagnosis not present

## 2023-05-06 DIAGNOSIS — Z7951 Long term (current) use of inhaled steroids: Secondary | ICD-10-CM | POA: Diagnosis not present

## 2023-05-06 DIAGNOSIS — R7303 Prediabetes: Secondary | ICD-10-CM | POA: Diagnosis not present

## 2023-05-06 DIAGNOSIS — I890 Lymphedema, not elsewhere classified: Secondary | ICD-10-CM | POA: Diagnosis not present

## 2023-05-06 DIAGNOSIS — M109 Gout, unspecified: Secondary | ICD-10-CM | POA: Diagnosis not present

## 2023-05-06 DIAGNOSIS — G309 Alzheimer's disease, unspecified: Secondary | ICD-10-CM | POA: Diagnosis not present

## 2023-05-06 DIAGNOSIS — F0280 Dementia in other diseases classified elsewhere without behavioral disturbance: Secondary | ICD-10-CM | POA: Diagnosis not present

## 2023-05-06 DIAGNOSIS — E785 Hyperlipidemia, unspecified: Secondary | ICD-10-CM | POA: Diagnosis not present

## 2023-05-06 DIAGNOSIS — G629 Polyneuropathy, unspecified: Secondary | ICD-10-CM | POA: Diagnosis not present

## 2023-05-06 DIAGNOSIS — Z9981 Dependence on supplemental oxygen: Secondary | ICD-10-CM | POA: Diagnosis not present

## 2023-05-06 DIAGNOSIS — I5030 Unspecified diastolic (congestive) heart failure: Secondary | ICD-10-CM | POA: Diagnosis not present

## 2023-05-06 DIAGNOSIS — Z89422 Acquired absence of other left toe(s): Secondary | ICD-10-CM | POA: Diagnosis not present

## 2023-05-06 DIAGNOSIS — N400 Enlarged prostate without lower urinary tract symptoms: Secondary | ICD-10-CM | POA: Diagnosis not present

## 2023-05-06 DIAGNOSIS — Z9181 History of falling: Secondary | ICD-10-CM | POA: Diagnosis not present

## 2023-05-06 DIAGNOSIS — Z4781 Encounter for orthopedic aftercare following surgical amputation: Secondary | ICD-10-CM | POA: Diagnosis not present

## 2023-05-07 DIAGNOSIS — I5030 Unspecified diastolic (congestive) heart failure: Secondary | ICD-10-CM

## 2023-05-07 DIAGNOSIS — J449 Chronic obstructive pulmonary disease, unspecified: Secondary | ICD-10-CM | POA: Diagnosis not present

## 2023-05-07 DIAGNOSIS — N400 Enlarged prostate without lower urinary tract symptoms: Secondary | ICD-10-CM

## 2023-05-07 DIAGNOSIS — R7303 Prediabetes: Secondary | ICD-10-CM

## 2023-05-07 DIAGNOSIS — I890 Lymphedema, not elsewhere classified: Secondary | ICD-10-CM | POA: Diagnosis not present

## 2023-05-07 DIAGNOSIS — G629 Polyneuropathy, unspecified: Secondary | ICD-10-CM

## 2023-05-07 DIAGNOSIS — Z4781 Encounter for orthopedic aftercare following surgical amputation: Secondary | ICD-10-CM | POA: Diagnosis not present

## 2023-05-07 DIAGNOSIS — F0280 Dementia in other diseases classified elsewhere without behavioral disturbance: Secondary | ICD-10-CM

## 2023-05-07 DIAGNOSIS — M109 Gout, unspecified: Secondary | ICD-10-CM

## 2023-05-07 DIAGNOSIS — E785 Hyperlipidemia, unspecified: Secondary | ICD-10-CM

## 2023-05-07 DIAGNOSIS — Z89422 Acquired absence of other left toe(s): Secondary | ICD-10-CM | POA: Diagnosis not present

## 2023-05-07 DIAGNOSIS — G309 Alzheimer's disease, unspecified: Secondary | ICD-10-CM

## 2023-05-10 ENCOUNTER — Other Ambulatory Visit: Payer: Self-pay | Admitting: Family Medicine

## 2023-05-10 DIAGNOSIS — F0280 Dementia in other diseases classified elsewhere without behavioral disturbance: Secondary | ICD-10-CM | POA: Diagnosis not present

## 2023-05-10 DIAGNOSIS — M109 Gout, unspecified: Secondary | ICD-10-CM | POA: Diagnosis not present

## 2023-05-10 DIAGNOSIS — I890 Lymphedema, not elsewhere classified: Secondary | ICD-10-CM | POA: Diagnosis not present

## 2023-05-10 DIAGNOSIS — N400 Enlarged prostate without lower urinary tract symptoms: Secondary | ICD-10-CM | POA: Diagnosis not present

## 2023-05-10 DIAGNOSIS — E785 Hyperlipidemia, unspecified: Secondary | ICD-10-CM | POA: Diagnosis not present

## 2023-05-10 DIAGNOSIS — R7303 Prediabetes: Secondary | ICD-10-CM | POA: Diagnosis not present

## 2023-05-10 DIAGNOSIS — G629 Polyneuropathy, unspecified: Secondary | ICD-10-CM | POA: Diagnosis not present

## 2023-05-10 DIAGNOSIS — F17210 Nicotine dependence, cigarettes, uncomplicated: Secondary | ICD-10-CM | POA: Diagnosis not present

## 2023-05-10 DIAGNOSIS — M1A09X0 Idiopathic chronic gout, multiple sites, without tophus (tophi): Secondary | ICD-10-CM

## 2023-05-10 DIAGNOSIS — J441 Chronic obstructive pulmonary disease with (acute) exacerbation: Secondary | ICD-10-CM

## 2023-05-10 DIAGNOSIS — Z7951 Long term (current) use of inhaled steroids: Secondary | ICD-10-CM | POA: Diagnosis not present

## 2023-05-10 DIAGNOSIS — Z9181 History of falling: Secondary | ICD-10-CM | POA: Diagnosis not present

## 2023-05-10 DIAGNOSIS — Z9981 Dependence on supplemental oxygen: Secondary | ICD-10-CM | POA: Diagnosis not present

## 2023-05-10 DIAGNOSIS — I5030 Unspecified diastolic (congestive) heart failure: Secondary | ICD-10-CM | POA: Diagnosis not present

## 2023-05-10 DIAGNOSIS — I5042 Chronic combined systolic (congestive) and diastolic (congestive) heart failure: Secondary | ICD-10-CM

## 2023-05-10 DIAGNOSIS — G309 Alzheimer's disease, unspecified: Secondary | ICD-10-CM | POA: Diagnosis not present

## 2023-05-10 DIAGNOSIS — Z4781 Encounter for orthopedic aftercare following surgical amputation: Secondary | ICD-10-CM | POA: Diagnosis not present

## 2023-05-10 DIAGNOSIS — Z89422 Acquired absence of other left toe(s): Secondary | ICD-10-CM | POA: Diagnosis not present

## 2023-05-10 DIAGNOSIS — J449 Chronic obstructive pulmonary disease, unspecified: Secondary | ICD-10-CM | POA: Diagnosis not present

## 2023-05-13 ENCOUNTER — Other Ambulatory Visit: Payer: Self-pay | Admitting: Family Medicine

## 2023-05-13 DIAGNOSIS — I890 Lymphedema, not elsewhere classified: Secondary | ICD-10-CM | POA: Diagnosis not present

## 2023-05-17 DIAGNOSIS — L89893 Pressure ulcer of other site, stage 3: Secondary | ICD-10-CM | POA: Diagnosis not present

## 2023-05-17 DIAGNOSIS — I4891 Unspecified atrial fibrillation: Secondary | ICD-10-CM | POA: Diagnosis not present

## 2023-05-17 DIAGNOSIS — I4510 Unspecified right bundle-branch block: Secondary | ICD-10-CM | POA: Diagnosis not present

## 2023-05-17 DIAGNOSIS — I872 Venous insufficiency (chronic) (peripheral): Secondary | ICD-10-CM | POA: Diagnosis not present

## 2023-05-17 DIAGNOSIS — L97929 Non-pressure chronic ulcer of unspecified part of left lower leg with unspecified severity: Secondary | ICD-10-CM | POA: Diagnosis not present

## 2023-05-17 DIAGNOSIS — I890 Lymphedema, not elsewhere classified: Secondary | ICD-10-CM | POA: Diagnosis not present

## 2023-05-17 DIAGNOSIS — I83029 Varicose veins of left lower extremity with ulcer of unspecified site: Secondary | ICD-10-CM | POA: Diagnosis not present

## 2023-05-20 ENCOUNTER — Other Ambulatory Visit: Payer: Self-pay | Admitting: Family Medicine

## 2023-05-20 DIAGNOSIS — M1A09X0 Idiopathic chronic gout, multiple sites, without tophus (tophi): Secondary | ICD-10-CM

## 2023-05-20 DIAGNOSIS — G2581 Restless legs syndrome: Secondary | ICD-10-CM

## 2023-05-20 DIAGNOSIS — J441 Chronic obstructive pulmonary disease with (acute) exacerbation: Secondary | ICD-10-CM

## 2023-05-20 DIAGNOSIS — R351 Nocturia: Secondary | ICD-10-CM

## 2023-05-20 MED ORDER — PROPRANOLOL HCL ER 60 MG PO CP24: 60.0000 mg | ORAL_CAPSULE | Freq: Every day | ORAL | 5 refills | Status: DC

## 2023-05-20 MED ORDER — HYDROXYZINE HCL 50 MG PO TABS: 50.0000 mg | ORAL_TABLET | Freq: Every day | ORAL | 5 refills | Status: DC

## 2023-05-20 MED ORDER — PREGABALIN 75 MG PO CAPS: 75.0000 mg | ORAL_CAPSULE | Freq: Three times a day (TID) | ORAL | 2 refills | Status: DC

## 2023-05-20 MED ORDER — VITAMIN D (ERGOCALCIFEROL) 1.25 MG (50000 UNIT) PO CAPS: 50000.0000 [IU] | ORAL_CAPSULE | ORAL | 3 refills | Status: DC

## 2023-05-20 MED ORDER — SERTRALINE HCL 100 MG PO TABS: 100.0000 mg | ORAL_TABLET | Freq: Every day | ORAL | 5 refills | Status: DC

## 2023-05-20 MED ORDER — TRELEGY ELLIPTA 100-62.5-25 MCG/ACT IN AEPB: 1.0000 | INHALATION_SPRAY | Freq: Every day | RESPIRATORY_TRACT | 11 refills | Status: DC

## 2023-05-20 MED ORDER — OMEPRAZOLE 40 MG PO CPDR: 40.0000 mg | DELAYED_RELEASE_CAPSULE | Freq: Every day | ORAL | 11 refills | Status: DC

## 2023-05-20 MED ORDER — TRELEGY ELLIPTA 200-62.5-25 MCG/ACT IN AEPB: 1.0000 | INHALATION_SPRAY | Freq: Every day | RESPIRATORY_TRACT | 3 refills | Status: DC

## 2023-05-20 MED ORDER — QUETIAPINE FUMARATE 50 MG PO TABS: 50.0000 mg | ORAL_TABLET | Freq: Every day | ORAL | 3 refills | Status: DC

## 2023-05-20 MED ORDER — TAMSULOSIN HCL 0.4 MG PO CAPS
ORAL_CAPSULE | ORAL | 2 refills | Status: DC
Start: 2023-05-20 — End: 2023-09-21

## 2023-05-20 MED ORDER — ROSUVASTATIN CALCIUM 5 MG PO TABS: 5.0000 mg | ORAL_TABLET | Freq: Every day | ORAL | 5 refills | Status: DC

## 2023-05-20 MED ORDER — MEMANTINE HCL ER 28 MG PO CP24: 28.0000 mg | ORAL_CAPSULE | Freq: Every day | ORAL | 0 refills | Status: DC

## 2023-05-20 MED ORDER — ROPINIROLE HCL 0.5 MG PO TABS: 0.5000 mg | ORAL_TABLET | Freq: Every day | ORAL | 2 refills | Status: DC

## 2023-05-20 MED ORDER — ALLOPURINOL 300 MG PO TABS
ORAL_TABLET | ORAL | 2 refills | Status: DC
Start: 2023-05-20 — End: 2023-06-10

## 2023-05-21 DIAGNOSIS — Z9181 History of falling: Secondary | ICD-10-CM | POA: Diagnosis not present

## 2023-05-21 DIAGNOSIS — Z89422 Acquired absence of other left toe(s): Secondary | ICD-10-CM | POA: Diagnosis not present

## 2023-05-21 DIAGNOSIS — Z4781 Encounter for orthopedic aftercare following surgical amputation: Secondary | ICD-10-CM | POA: Diagnosis not present

## 2023-05-21 DIAGNOSIS — N400 Enlarged prostate without lower urinary tract symptoms: Secondary | ICD-10-CM | POA: Diagnosis not present

## 2023-05-21 DIAGNOSIS — F17210 Nicotine dependence, cigarettes, uncomplicated: Secondary | ICD-10-CM | POA: Diagnosis not present

## 2023-05-21 DIAGNOSIS — R7303 Prediabetes: Secondary | ICD-10-CM | POA: Diagnosis not present

## 2023-05-21 DIAGNOSIS — I890 Lymphedema, not elsewhere classified: Secondary | ICD-10-CM | POA: Diagnosis not present

## 2023-05-21 DIAGNOSIS — J449 Chronic obstructive pulmonary disease, unspecified: Secondary | ICD-10-CM | POA: Diagnosis not present

## 2023-05-21 DIAGNOSIS — E785 Hyperlipidemia, unspecified: Secondary | ICD-10-CM | POA: Diagnosis not present

## 2023-05-21 DIAGNOSIS — G309 Alzheimer's disease, unspecified: Secondary | ICD-10-CM | POA: Diagnosis not present

## 2023-05-21 DIAGNOSIS — I5030 Unspecified diastolic (congestive) heart failure: Secondary | ICD-10-CM | POA: Diagnosis not present

## 2023-05-21 DIAGNOSIS — Z9981 Dependence on supplemental oxygen: Secondary | ICD-10-CM | POA: Diagnosis not present

## 2023-05-21 DIAGNOSIS — M109 Gout, unspecified: Secondary | ICD-10-CM | POA: Diagnosis not present

## 2023-05-21 DIAGNOSIS — Z7951 Long term (current) use of inhaled steroids: Secondary | ICD-10-CM | POA: Diagnosis not present

## 2023-05-21 DIAGNOSIS — G629 Polyneuropathy, unspecified: Secondary | ICD-10-CM | POA: Diagnosis not present

## 2023-05-21 DIAGNOSIS — F0280 Dementia in other diseases classified elsewhere without behavioral disturbance: Secondary | ICD-10-CM | POA: Diagnosis not present

## 2023-05-22 ENCOUNTER — Encounter: Payer: Self-pay | Admitting: Family Medicine

## 2023-05-22 ENCOUNTER — Ambulatory Visit (INDEPENDENT_AMBULATORY_CARE_PROVIDER_SITE_OTHER): Payer: Medicare Other | Admitting: Family Medicine

## 2023-05-22 VITALS — BP 124/68 | HR 79 | Temp 97.4°F | Ht 71.0 in | Wt 230.0 lb

## 2023-05-22 DIAGNOSIS — I4891 Unspecified atrial fibrillation: Secondary | ICD-10-CM

## 2023-05-22 MED ORDER — APIXABAN 5 MG PO TABS: 5.0000 mg | ORAL_TABLET | Freq: Two times a day (BID) | ORAL | 0 refills | Status: DC

## 2023-05-22 MED ORDER — APIXABAN 5 MG PO TABS
5.0000 mg | ORAL_TABLET | Freq: Two times a day (BID) | ORAL | 0 refills | Status: DC
Start: 2023-05-22 — End: 2023-07-11

## 2023-05-22 NOTE — Assessment & Plan Note (Addendum)
EKG ordered.  Consistent with atrial fibrillation. Restarted propranolol last week.  Rate is controlled. Start eliquis 5 mg twice daily.  Informed patient that should he fall while on blood thinner and hits his head he must be evaluated at ED. Order labs Refer to cardiology.

## 2023-05-22 NOTE — Patient Instructions (Signed)
Start eliquis 5 mg twice daily.   Referred to cardiology.

## 2023-05-22 NOTE — Progress Notes (Unsigned)
Acute Office Visit  Subjective:    Patient ID: Shawn Osborn, male    DOB: 1951/05/20, 72 y.o.   MRN: [id number]  Chief Complaint  Patient presents with   Atrial Fibrillation    HPI: Patient is in today for shob. States he has not had chest pain but did feel "flutters" in his chest. States he had been out of his medications for 5 days before getting a refill. Patient found to have new onset atrial fibrillation which began at least last Friday. It was found when patient was seen at the wound care center and he had RVR at that time. He refused AMA to go to ED. He was off his meds for 5 days last week including propranolol prior to the appointment with wound care. He is now back on his meds and rate is 80s, but still atrial fibrillation. Patient saw Tobb when she was in [place]. No other symptoms.   Past Medical History:  Diagnosis Date   Acute combined systolic and diastolic CHF, NYHA class 3 (HCC) 10/08/2019   Acute delirium 10/08/2019   Acute exacerbation of CHF (congestive heart failure) (HCC) 10/08/2019   Acute gout of multiple sites 07/01/2016   Acute respiratory failure with hypoxia and hypercapnia (HCC) 10/08/2019   Allergy    Anxiety    Arthritis    Gout, osteoarthritis in Back and Knees   Auditory hallucinations 05/01/2022   Cataract    CHF (congestive heart failure) (HCC)    Chronic bronchitis    Community acquired pneumonia 10/08/2019   COPD (chronic obstructive pulmonary disease) (HCC) 10/08/2019   Dementia (HCC) 10/08/2019   Elevated troponin 10/08/2019   Emphysema of lung (HCC)    Essential hypertension 07/03/2016   GERD (gastroesophageal reflux disease)    History of kidney stones    HLD (hyperlipidemia) 10/08/2019   Hypercapnic respiratory failure (HCC) 10/08/2019   Hyperlipidemia    Hypertension    Hypertensive heart disease 10/08/2019   Morbid obesity due to excess calories (HCC) 10/08/2019   Narcotic overdose (HCC) 10/08/2019   Neuropathy    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   NSTEMI (non-ST elevated myocardial infarction) (HCC) 10/08/2019   Obstructive sleep apnea    Occlusion and stenosis of carotid artery without mention of cerebral infarction 12/13/2011   OSA on CPAP 10/08/2019   Pain in joint of left shoulder 01/12/2019   Paranoia (psychosis) (HCC) 07/01/2016   Poorly-controlled hypertension 10/08/2019   Prediabetes    Psychotic disorder with delusions (HCC) 07/01/2016   Respiratory failure with hypoxia and hypercapnia (HCC) 10/08/2019   Restless leg syndrome 07/03/2016   Rheumatoid arthritis(714.0)    RLS (restless legs syndrome)    Sepsis (HCC) 10/08/2019   Sleep apnea    Spinal stenosis    Syncope 04/03/2019   Tear of left rotator cuff 01/12/2019   Toxic metabolic encephalopathy 10/08/2019   UTI (urinary tract infection) 10/08/2019   Vascular dementia with behavioral disturbance (HCC) 07/06/2016   Visual hallucinations 05/01/2022    Past Surgical History:  Procedure Laterality Date   AMPUTATION TOE Left    APPENDECTOMY     CARPAL TUNNEL RELEASE     CORONARY PRESSURE/FFR STUDY N/A 10/20/2019   Procedure: INTRAVASCULAR PRESSURE WIRE/FFR STUDY;  Surgeon: Lyn Records, MD;  Location: [facility] INVASIVE CV LAB;  Service: Cardiovascular;  Laterality: N/A;   EYE SURGERY     LEFT HEART CATH AND CORONARY ANGIOGRAPHY N/A 10/20/2019   Procedure: LEFT HEART CATH AND CORONARY ANGIOGRAPHY;  Surgeon: Lyn Records, MD;  Location: [facility] INVASIVE CV LAB;  Service: Cardiovascular;  Laterality: N/A;   SPINE SURGERY  1998, 2003, 2012   Laminectomy X 3    Family History  Problem Relation Age of Onset   Heart disease Mother    Hypertension Mother     Social History   Socioeconomic History   Marital status: Widowed    Spouse name: Not on file   Number of children: 4   Years of education: Not on file   Highest education level: Not on file  Occupational History   Not on file  Tobacco Use   Smoking status: Every Day     Packs/day: 0.75    Years: 53.00    Additional pack years: 0.00    Total pack years: 39.75    Types: Cigarettes   Smokeless tobacco: Current    Types: Chew  Vaping Use   Vaping Use: Never used  Substance and Sexual Activity   Alcohol use: No   Drug use: No   Sexual activity: Not Currently  Other Topics Concern   Not on file  Social History Narrative   Not on file   Social Determinants of Health   Financial Resource Strain: Low Risk  (10/30/2022)   Overall Financial Resource Strain (CARDIA)    Difficulty of Paying Living Expenses: Not very hard  Food Insecurity: No Food Insecurity (10/30/2022)   Hunger Vital Sign    Worried About Running Out of Food in the Last Year: Never true    Ran Out of Food in the Last Year: Never true  Transportation Needs: No Transportation Needs (10/30/2022)   PRAPARE - Administrator, Civil Service (Medical): No    Lack of Transportation (Non-Medical): No  Physical Activity: Inactive (10/30/2022)   Exercise Vital Sign    Days of Exercise per Week: 0 days    Minutes of Exercise per Session: 0 min  Stress: No Stress Concern Present (10/30/2022)   [employer] of Occupational Health - Occupational Stress Questionnaire    Feeling of Stress : Not at all  Social Connections: Moderately Isolated (10/30/2022)   Social Connection and Isolation Panel [NHANES]    Frequency of Communication with Friends and Family: More than three times a week    Frequency of Social Gatherings with Friends and Family: More than three times a week    Attends Religious Services: More than 4 times per year    Active Member of [employer] or Organizations: No    Attends Banker Meetings: Never    Marital Status: Widowed  Intimate Partner Violence: Not At Risk (10/30/2022)   Humiliation, Afraid, Rape, and Kick questionnaire    Fear of Current or Ex-Partner: No    Emotionally Abused: No    Physically Abused: No    Sexually Abused: No    Outpatient  Medications Prior to Visit  Medication Sig Dispense Refill   albuterol (PROVENTIL) (2.5 MG/3ML) 0.083% nebulizer solution Take 3 mLs (2.5 mg total) by nebulization every 6 (six) hours as needed for wheezing or shortness of breath. 120 mL 12   allopurinol (ZYLOPRIM) 300 MG tablet TAKE ONE TABLET BY MOUTH EVERY DAY at 1pm 30 tablet 2   donepezil (ARICEPT) 10 MG tablet TAKE ONE TABLET BY MOUTH EVERY EVENING 90 tablet 3   Fluticasone-Umeclidin-Vilant (TRELEGY ELLIPTA) 100-62.5-25 MCG/ACT AEPB Inhale 1 puff into the lungs daily. 1 each 11   Fluticasone-Umeclidin-Vilant (TRELEGY ELLIPTA) 200-62.5-25 MCG/ACT AEPB Inhale 1 Inhalation into the  lungs daily. 60 each 3   furosemide (LASIX) 40 MG tablet Take 1 tablet (40 mg total) by mouth daily. 30 tablet 3   hydrocortisone 2.5%-Eucerin equivalent 1:1 cream mixture Apply topically 2 (two) times daily. 120 g 0   hydrOXYzine (ATARAX) 50 MG tablet Take 1 tablet (50 mg total) by mouth at bedtime. 30 tablet 5   ipratropium-albuterol (DUONEB) 0.5-2.5 (3) MG/3ML SOLN Take 3 mLs by nebulization every 4 (four) hours as needed. 360 mL 2   memantine (NAMENDA XR) 28 MG CP24 24 hr capsule Take 1 capsule (28 mg total) by mouth at bedtime. 90 capsule 0   omega-3 acid ethyl esters (LOVAZA) 1 g capsule TAKE 2 CAPSULES BY MOUTH TWICE DAILY 360 capsule 1   omeprazole (PRILOSEC) 40 MG capsule Take 1 capsule (40 mg total) by mouth daily. 30 capsule 11   pregabalin (LYRICA) 75 MG capsule Take 1 capsule (75 mg total) by mouth 3 (three) times daily. 90 capsule 2   propranolol ER (INDERAL LA) 60 MG 24 hr capsule Take 1 capsule (60 mg total) by mouth daily. 30 capsule 5   QUEtiapine (SEROQUEL) 50 MG tablet Take 1 tablet (50 mg total) by mouth at bedtime. 30 tablet 3   ramelteon (ROZEREM) 8 MG tablet TAKE ONE TABLET BY MOUTH AT BEDTIME 30 tablet 11   rOPINIRole (REQUIP) 0.5 MG tablet Take 1 tablet (0.5 mg total) by mouth at bedtime. 30 tablet 2   rosuvastatin (CRESTOR) 5 MG tablet Take  1 tablet (5 mg total) by mouth at bedtime. 30 tablet 5   sertraline (ZOLOFT) 100 MG tablet Take 1 tablet (100 mg total) by mouth at bedtime. 30 tablet 5   tamsulosin (FLOMAX) 0.4 MG CAPS capsule TAKE 2 CAPSULES BY MOUTH EVERY EVENING 60 capsule 2   triamcinolone cream (KENALOG) 0.1 % Apply 1 application. topically 2 (two) times daily as needed (to ears for itching). 30 g 2   VENTOLIN HFA 108 (90 Base) MCG/ACT inhaler Inhale 2 puffs into the lungs every 6 hours as needed for wheezing or shortness of breath. 18 g 3   Vitamin D, Ergocalciferol, (DRISDOL) 1.25 MG (50000 UNIT) CAPS capsule Take 1 capsule (50,000 Units total) by mouth once a week. 15 capsule 3   No facility-administered medications prior to visit.    Allergies  Allergen Reactions   Avelox [Moxifloxacin Hcl In Nacl] Swelling   Codeine Phosphate Itching   Colesevelam Other (See Comments)   Cozaar     Unknown    Cymbalta [Duloxetine Hcl]     Twitching   Ezetimibe Nausea Only   Methocarbamol Other (See Comments)   Ropinirole     Weakness, dizziness, felt sick   Statins     myalgia   Welchol [Colesevelam Hcl]    Penicillins Rash    Did it involve swelling of the face/tongue/throat, SOB, or low BP? No Did it involve sudden or severe rash/hives, skin peeling, or any reaction on the inside of your mouth or nose? Yes Did you need to seek medical attention at a hospital or doctor's office? Yes When did it last happen?      15 + years If all above answers are "NO", may proceed with cephalosporin use.     Review of Systems  Constitutional:  Negative for chills, diaphoresis, fatigue and fever.  HENT:  Negative for congestion, ear pain and sore throat.   Respiratory:  Negative for cough and shortness of breath.   Cardiovascular:  Negative for chest pain  and leg swelling.  Gastrointestinal:  Negative for abdominal pain, constipation, diarrhea, nausea and vomiting.  Genitourinary:  Negative for dysuria and urgency.        Complaining of bladder incontinence due to "turtleing" of penis.   Musculoskeletal:  Negative for arthralgias and myalgias.  Neurological:  Negative for dizziness and headaches.  Psychiatric/Behavioral:  Negative for dysphoric mood.        Objective:        05/22/2023    2:39 PM 04/08/2023    3:11 PM 03/27/2023   11:17 AM  Vitals with BMI  Height 5\' 11"   5\' 11"   Weight 230 lbs  225 lbs  BMI 32.09  31.39  Systolic 124 180 161  Diastolic 68 100 80  Pulse 79 53 54    No data found.   Physical Exam Vitals reviewed.  Constitutional:      Appearance: Normal appearance. He is obese.  Neck:     Vascular: No carotid bruit.  Cardiovascular:     Rate and Rhythm: Normal rate. Rhythm irregular.     Heart sounds: No murmur heard. Pulmonary:     Effort: Pulmonary effort is normal.     Breath sounds: Normal breath sounds.  Abdominal:     General: Abdomen is flat. Bowel sounds are normal.     Palpations: Abdomen is soft.     Tenderness: There is no abdominal tenderness.  Neurological:     Mental Status: He is alert and oriented to person, place, and time.  Psychiatric:        Mood and Affect: Mood normal.        Behavior: Behavior normal.     Health Maintenance Due  Topic Date Due   Hepatitis C Screening  Never done   Medicare Annual Wellness (AWV)  04/18/2023    There are no preventive care reminders to display for this patient.   Lab Results  Component Value Date   TSH 1.850 05/22/2023   Lab Results  Component Value Date   WBC 10.5 05/22/2023   HGB 12.8 (L) 05/22/2023   HCT 40.7 05/22/2023   MCV 87 05/22/2023   PLT 230 05/22/2023   Lab Results  Component Value Date   NA 147 (H) 05/22/2023   K 4.0 05/22/2023   CO2 22 05/22/2023   GLUCOSE 85 05/22/2023   BUN 13 05/22/2023   CREATININE 0.55 (L) 05/22/2023   BILITOT <0.2 05/22/2023   ALKPHOS 82 05/22/2023   AST 15 05/22/2023   ALT 18 05/22/2023   PROT 5.8 (L) 05/22/2023   ALBUMIN 3.8 05/22/2023   CALCIUM  8.8 05/22/2023   ANIONGAP 11 09/01/2020   EGFR 105 05/22/2023   Lab Results  Component Value Date   CHOL 143 03/27/2023   Lab Results  Component Value Date   HDL 44 03/27/2023   Lab Results  Component Value Date   LDLCALC 74 03/27/2023   Lab Results  Component Value Date   TRIG 140 03/27/2023   Lab Results  Component Value Date   CHOLHDL 3.3 03/27/2023   Lab Results  Component Value Date   HGBA1C 5.9 (H) 03/27/2023       Assessment & Plan:  Atrial fibrillation, unspecified type (HCC) Assessment & Plan: EKG ordered.  Consistent with atrial fibrillation. Restarted propranolol last week.  Rate is controlled. Start eliquis 5 mg twice daily.  Informed patient that should he fall while on blood thinner and hits his head he must be evaluated at ED. Order labs Refer  to cardiology.   Orders: -     EKG 12-Lead -     CBC with Differential/Platelet -     Comprehensive metabolic panel -     TSH -     Apixaban; Take 1 tablet (5 mg total) by mouth 2 (two) times daily.  Dispense: 60 tablet; Refill: 0 -     Apixaban; Take 1 tablet (5 mg total) by mouth 2 (two) times daily.  Dispense: 28 tablet; Refill: 0 -     Ambulatory referral to Cardiology     Meds ordered this encounter  Medications   apixaban (ELIQUIS) 5 MG TABS tablet    Sig: Take 1 tablet (5 mg total) by mouth 2 (two) times daily.    Dispense:  60 tablet    Refill:  0    [id number] RXPCN: [id number] GRP: [id number] ID: [id number] Free 30 day trial.   apixaban (ELIQUIS) 5 MG TABS tablet    Sig: Take 1 tablet (5 mg total) by mouth 2 (two) times daily.    Dispense:  28 tablet    Refill:  0    Orders Placed This Encounter  Procedures   CBC with Differential/Platelet   Comprehensive metabolic panel   TSH   Ambulatory referral to Cardiology   EKG 12-Lead     Follow-up: No follow-ups on file.  An After Visit Summary was printed and given to the patient.   Clayborn Bigness I Leal-Borjas,acting as a scribe for Blane Ohara, MD.,have documented all relevant documentation on the behalf of Blane Ohara, MD,as directed by  Blane Ohara, MD while in the presence of Blane Ohara, MD.   Blane Ohara, MD [facility] [phone]

## 2023-05-23 LAB — CBC WITH DIFFERENTIAL/PLATELET
Basophils Absolute: 0.1 10*3/uL (ref 0.0–0.2)
Basos: 1 %
EOS (ABSOLUTE): 0.3 10*3/uL (ref 0.0–0.4)
Eos: 3 %
Hematocrit: 40.7 % (ref 37.5–51.0)
Hemoglobin: 12.8 g/dL — ABNORMAL LOW (ref 13.0–17.7)
Immature Grans (Abs): 0 10*3/uL (ref 0.0–0.1)
Immature Granulocytes: 0 %
Lymphocytes Absolute: 2.9 10*3/uL (ref 0.7–3.1)
Lymphs: 28 %
MCH: 27.4 pg (ref 26.6–33.0)
MCHC: 31.4 g/dL — ABNORMAL LOW (ref 31.5–35.7)
MCV: 87 fL (ref 79–97)
Monocytes Absolute: 0.8 10*3/uL (ref 0.1–0.9)
Monocytes: 7 %
Neutrophils Absolute: 6.5 10*3/uL (ref 1.4–7.0)
Neutrophils: 61 %
Platelets: 230 10*3/uL (ref 150–450)
RBC: 4.67 x10E6/uL (ref 4.14–5.80)
RDW: 16.4 % — ABNORMAL HIGH (ref 11.6–15.4)
WBC: 10.5 10*3/uL (ref 3.4–10.8)

## 2023-05-23 LAB — COMPREHENSIVE METABOLIC PANEL
ALT: 18 IU/L (ref 0–44)
AST: 15 IU/L (ref 0–40)
Albumin: 3.8 g/dL (ref 3.8–4.8)
Alkaline Phosphatase: 82 IU/L (ref 44–121)
BUN/Creatinine Ratio: 24 (ref 10–24)
BUN: 13 mg/dL (ref 8–27)
Bilirubin Total: <0.2 mg/dL (ref 0.0–1.2)
CO2: 22 mmol/L (ref 20–29)
Calcium: 8.8 mg/dL (ref 8.6–10.2)
Chloride: 110 mmol/L — ABNORMAL HIGH (ref 96–106)
Creatinine, Ser: 0.55 mg/dL — ABNORMAL LOW (ref 0.76–1.27)
Globulin, Total: 2 g/dL (ref 1.5–4.5)
Glucose: 85 mg/dL (ref 70–99)
Potassium: 4 mmol/L (ref 3.5–5.2)
Sodium: 147 mmol/L — ABNORMAL HIGH (ref 134–144)
Total Protein: 5.8 g/dL — ABNORMAL LOW (ref 6.0–8.5)
eGFR: 105 mL/min/{1.73_m2} (ref >59)

## 2023-05-23 LAB — TSH: TSH: 1.85 u[IU]/mL (ref 0.450–4.500)

## 2023-05-25 ENCOUNTER — Other Ambulatory Visit: Payer: Self-pay | Admitting: Family Medicine

## 2023-05-25 DIAGNOSIS — G2581 Restless legs syndrome: Secondary | ICD-10-CM

## 2023-05-27 DIAGNOSIS — I890 Lymphedema, not elsewhere classified: Secondary | ICD-10-CM | POA: Diagnosis not present

## 2023-05-27 DIAGNOSIS — Z7951 Long term (current) use of inhaled steroids: Secondary | ICD-10-CM | POA: Diagnosis not present

## 2023-05-27 DIAGNOSIS — M109 Gout, unspecified: Secondary | ICD-10-CM | POA: Diagnosis not present

## 2023-05-27 DIAGNOSIS — R7303 Prediabetes: Secondary | ICD-10-CM | POA: Diagnosis not present

## 2023-05-27 DIAGNOSIS — Z9981 Dependence on supplemental oxygen: Secondary | ICD-10-CM | POA: Diagnosis not present

## 2023-05-27 DIAGNOSIS — G629 Polyneuropathy, unspecified: Secondary | ICD-10-CM | POA: Diagnosis not present

## 2023-05-27 DIAGNOSIS — I5030 Unspecified diastolic (congestive) heart failure: Secondary | ICD-10-CM | POA: Diagnosis not present

## 2023-05-27 DIAGNOSIS — Z9181 History of falling: Secondary | ICD-10-CM | POA: Diagnosis not present

## 2023-05-27 DIAGNOSIS — F0280 Dementia in other diseases classified elsewhere without behavioral disturbance: Secondary | ICD-10-CM | POA: Diagnosis not present

## 2023-05-27 DIAGNOSIS — G309 Alzheimer's disease, unspecified: Secondary | ICD-10-CM | POA: Diagnosis not present

## 2023-05-27 DIAGNOSIS — Z89422 Acquired absence of other left toe(s): Secondary | ICD-10-CM | POA: Diagnosis not present

## 2023-05-27 DIAGNOSIS — F17210 Nicotine dependence, cigarettes, uncomplicated: Secondary | ICD-10-CM | POA: Diagnosis not present

## 2023-05-27 DIAGNOSIS — N400 Enlarged prostate without lower urinary tract symptoms: Secondary | ICD-10-CM | POA: Diagnosis not present

## 2023-05-27 DIAGNOSIS — E785 Hyperlipidemia, unspecified: Secondary | ICD-10-CM | POA: Diagnosis not present

## 2023-05-27 DIAGNOSIS — J449 Chronic obstructive pulmonary disease, unspecified: Secondary | ICD-10-CM | POA: Diagnosis not present

## 2023-05-27 DIAGNOSIS — Z4781 Encounter for orthopedic aftercare following surgical amputation: Secondary | ICD-10-CM | POA: Diagnosis not present

## 2023-05-29 ENCOUNTER — Encounter: Payer: Self-pay | Admitting: Family Medicine

## 2023-06-05 ENCOUNTER — Telehealth: Payer: Self-pay

## 2023-06-05 DIAGNOSIS — Z7951 Long term (current) use of inhaled steroids: Secondary | ICD-10-CM | POA: Diagnosis not present

## 2023-06-05 DIAGNOSIS — Z9981 Dependence on supplemental oxygen: Secondary | ICD-10-CM | POA: Diagnosis not present

## 2023-06-05 DIAGNOSIS — F17210 Nicotine dependence, cigarettes, uncomplicated: Secondary | ICD-10-CM | POA: Diagnosis not present

## 2023-06-05 DIAGNOSIS — Z89422 Acquired absence of other left toe(s): Secondary | ICD-10-CM | POA: Diagnosis not present

## 2023-06-05 DIAGNOSIS — M109 Gout, unspecified: Secondary | ICD-10-CM | POA: Diagnosis not present

## 2023-06-05 DIAGNOSIS — J449 Chronic obstructive pulmonary disease, unspecified: Secondary | ICD-10-CM | POA: Diagnosis not present

## 2023-06-05 DIAGNOSIS — I5030 Unspecified diastolic (congestive) heart failure: Secondary | ICD-10-CM | POA: Diagnosis not present

## 2023-06-05 DIAGNOSIS — E785 Hyperlipidemia, unspecified: Secondary | ICD-10-CM | POA: Diagnosis not present

## 2023-06-05 DIAGNOSIS — I890 Lymphedema, not elsewhere classified: Secondary | ICD-10-CM | POA: Diagnosis not present

## 2023-06-05 DIAGNOSIS — G629 Polyneuropathy, unspecified: Secondary | ICD-10-CM | POA: Diagnosis not present

## 2023-06-05 DIAGNOSIS — N400 Enlarged prostate without lower urinary tract symptoms: Secondary | ICD-10-CM | POA: Diagnosis not present

## 2023-06-05 DIAGNOSIS — G309 Alzheimer's disease, unspecified: Secondary | ICD-10-CM | POA: Diagnosis not present

## 2023-06-05 DIAGNOSIS — R7303 Prediabetes: Secondary | ICD-10-CM | POA: Diagnosis not present

## 2023-06-05 DIAGNOSIS — Z4781 Encounter for orthopedic aftercare following surgical amputation: Secondary | ICD-10-CM | POA: Diagnosis not present

## 2023-06-05 DIAGNOSIS — Z9181 History of falling: Secondary | ICD-10-CM | POA: Diagnosis not present

## 2023-06-05 DIAGNOSIS — F0280 Dementia in other diseases classified elsewhere without behavioral disturbance: Secondary | ICD-10-CM | POA: Diagnosis not present

## 2023-06-05 NOTE — Telephone Encounter (Signed)
Agree 

## 2023-06-05 NOTE — Telephone Encounter (Signed)
Patient called ad stated that he has had diarrhea for 5 days and started a fever today. Denies any vomiting, nausea, SOB, chest pain.   Recommend patient go be seen at the nearest ER or urgent care due to office not having appointment available  today and he has started having a fever with it, made him aware after he is seen today he can follow up with provider if he needs to.  Patient Made Aware, Verbalized Understanding. Stated he will go to [facility] in [place]

## 2023-06-10 ENCOUNTER — Other Ambulatory Visit: Payer: Self-pay | Admitting: Family Medicine

## 2023-06-10 DIAGNOSIS — M1A09X0 Idiopathic chronic gout, multiple sites, without tophus (tophi): Secondary | ICD-10-CM

## 2023-06-10 DIAGNOSIS — I5042 Chronic combined systolic (congestive) and diastolic (congestive) heart failure: Secondary | ICD-10-CM

## 2023-06-12 ENCOUNTER — Telehealth: Payer: Self-pay

## 2023-06-12 NOTE — Telephone Encounter (Signed)
Physical therapist from [facility] called and stated that she went out to see the patient today and patient's temp was 99.9 and both his legs had edema with blistering and weaping of both legs and said he also had a open wound of the distal aspect of the right big toe too as well. She advised that the patient go to the ER and he stated that he would call [facility] which is who he is seeing. She states she called him back after lunch to check to see if he had gotten in touch with wound care and she stated that he said he has a scheduled appointment with them on Friday to see them for his wounds and will see them then. She states that she still advised him to go to the ER but still wanted to inform you of what was going on.

## 2023-06-19 ENCOUNTER — Ambulatory Visit (INDEPENDENT_AMBULATORY_CARE_PROVIDER_SITE_OTHER): Payer: Medicare Other

## 2023-06-19 DIAGNOSIS — Z0000 Encounter for general adult medical examination without abnormal findings: Secondary | ICD-10-CM | POA: Diagnosis not present

## 2023-06-19 NOTE — Progress Notes (Signed)
Subjective:   Shawn Osborn is a 72 y.o. male who presents for Medicare Annual/Subsequent preventive examination.  This wellness visit is conducted by a nurse.  The patient's medications were reviewed and reconciled since the patient's last visit.  History details were provided by the patient.    Medical History: Patient history and Family history was reviewed  Medications, Allergies, and preventative health maintenance was reviewed and updated.   Visit Complete: Virtual  I connected with  Jannet Mantis on 06/19/23 by a audio enabled telemedicine application and verified that I am speaking with the correct person using two identifiers.  Patient Location: Home  Provider Location: Office/Clinic  I discussed the limitations of evaluation and management by telemedicine. The patient expressed understanding and agreed to proceed.  Cardiac Risk Factors include: sedentary lifestyle;advanced age (>3men, >72 women);diabetes mellitus;dyslipidemia;male gender;obesity (BMI >30kg/m2);smoking/ tobacco exposure     Objective:    Today's Vitals   06/19/23 1403  PainSc: 8   Patient was unable to self-report due to a lack of equipment at home via telehealth  There is no height or weight on file to calculate BMI.     08/31/2020   12:00 PM 10/20/2019    7:47 AM 12/04/2018    2:15 PM  Advanced Directives  Does Patient Have a Medical Advance Directive? No No No  Would patient like information on creating a medical advance directive? No - Patient declined No - Patient declined Yes (MAU/Ambulatory/Procedural Areas - Information given)    Current Medications (verified) Outpatient Encounter Medications as of 06/19/2023  Medication Sig   albuterol (PROVENTIL) (2.5 MG/3ML) 0.083% nebulizer solution Take 3 mLs (2.5 mg total) by nebulization every 6 (six) hours as needed for wheezing or shortness of breath.   allopurinol (ZYLOPRIM) 300 MG tablet TAKE ONE TABLET BY MOUTH EVERY DAY at 1pm    apixaban (ELIQUIS) 5 MG TABS tablet Take 1 tablet (5 mg total) by mouth 2 (two) times daily.   apixaban (ELIQUIS) 5 MG TABS tablet Take 1 tablet (5 mg total) by mouth 2 (two) times daily.   donepezil (ARICEPT) 10 MG tablet TAKE ONE TABLET BY MOUTH EVERY EVENING   Fluticasone-Umeclidin-Vilant (TRELEGY ELLIPTA) 100-62.5-25 MCG/ACT AEPB Inhale 1 puff into the lungs daily.   Fluticasone-Umeclidin-Vilant (TRELEGY ELLIPTA) 200-62.5-25 MCG/ACT AEPB Inhale 1 Inhalation into the lungs daily.   furosemide (LASIX) 40 MG tablet Take 1 tablet (40 mg total) by mouth 2 (two) times daily.   hydrocortisone 2.5%-Eucerin equivalent 1:1 cream mixture Apply topically 2 (two) times daily.   hydrOXYzine (ATARAX) 50 MG tablet TAKE ONE TABLET BY MOUTH AT BEDTIME   ipratropium-albuterol (DUONEB) 0.5-2.5 (3) MG/3ML SOLN Take 3 mLs by nebulization every 4 (four) hours as needed.   memantine (NAMENDA XR) 28 MG CP24 24 hr capsule TAKE ONE CAPSULE BY MOUTH AT BEDTIME   omega-3 acid ethyl esters (LOVAZA) 1 g capsule TAKE 2 CAPSULES BY MOUTH TWICE DAILY   omeprazole (PRILOSEC) 40 MG capsule TAKE ONE CAPSULE BY MOUTH EVERY DAY   pregabalin (LYRICA) 75 MG capsule Take 1 capsule (75 mg total) by mouth 3 (three) times daily.   propranolol ER (INDERAL LA) 60 MG 24 hr capsule Take 1 capsule (60 mg total) by mouth daily.   QUEtiapine (SEROQUEL) 50 MG tablet Take 1 tablet (50 mg total) by mouth at bedtime.   ramelteon (ROZEREM) 8 MG tablet TAKE ONE TABLET BY MOUTH AT BEDTIME   rOPINIRole (REQUIP) 0.5 MG tablet Take 1 tablet (0.5 mg total) by  mouth at bedtime.   rosuvastatin (CRESTOR) 5 MG tablet TAKE ONE TABLET BY MOUTH AT BEDTIME   sertraline (ZOLOFT) 100 MG tablet TAKE ONE TABLET BY MOUTH AT BEDTIME   tamsulosin (FLOMAX) 0.4 MG CAPS capsule TAKE 2 CAPSULES BY MOUTH EVERY EVENING   triamcinolone cream (KENALOG) 0.1 % Apply 1 application. topically 2 (two) times daily as needed (to ears for itching).   VENTOLIN HFA 108 (90 Base)  MCG/ACT inhaler Inhale 2 puffs into the lungs every 6 hours as needed for wheezing or shortness of breath.   Vitamin D, Ergocalciferol, (DRISDOL) 1.25 MG (50000 UNIT) CAPS capsule Take 1 capsule (50,000 Units total) by mouth once a week.   No facility-administered encounter medications on file as of 06/19/2023.    Allergies (verified) Avelox [moxifloxacin hcl in nacl], Codeine phosphate, Colesevelam, Cozaar, Cymbalta [duloxetine hcl], Ezetimibe, Methocarbamol, Ropinirole, Statins, Welchol [colesevelam hcl], and Penicillins   History: Past Medical History:  Diagnosis Date   Acute combined systolic and diastolic CHF, NYHA class 3 (HCC) 10/08/2019   Acute delirium 10/08/2019   Acute exacerbation of CHF (congestive heart failure) (HCC) 10/08/2019   Acute gout of multiple sites 07/01/2016   Acute respiratory failure with hypoxia and hypercapnia (HCC) 10/08/2019   Allergy    Anxiety    Arthritis    Gout, osteoarthritis in Back and Knees   Auditory hallucinations 05/01/2022   Cataract    CHF (congestive heart failure) (HCC)    Chronic bronchitis    Community acquired pneumonia 10/08/2019   COPD (chronic obstructive pulmonary disease) (HCC) 10/08/2019   Dementia (HCC) 10/08/2019   Elevated troponin 10/08/2019   Emphysema of lung (HCC)    Essential hypertension 07/03/2016   GERD (gastroesophageal reflux disease)    History of kidney stones    HLD (hyperlipidemia) 10/08/2019   Hypercapnic respiratory failure (HCC) 10/08/2019   Hyperlipidemia    Hypertension    Hypertensive heart disease 10/08/2019   Morbid obesity due to excess calories (HCC) 10/08/2019   Narcotic overdose (HCC) 10/08/2019   Neuropathy    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   NSTEMI (non-ST elevated myocardial infarction) (HCC) 10/08/2019   Obstructive sleep apnea    Occlusion and stenosis of carotid artery without mention of cerebral infarction 12/13/2011   OSA on CPAP 10/08/2019   Pain in joint of left  shoulder 01/12/2019   Paranoia (psychosis) (HCC) 07/01/2016   Poorly-controlled hypertension 10/08/2019   Prediabetes    Psychotic disorder with delusions (HCC) 07/01/2016   Respiratory failure with hypoxia and hypercapnia (HCC) 10/08/2019   Restless leg syndrome 07/03/2016   Rheumatoid arthritis(714.0)    RLS (restless legs syndrome)    Sepsis (HCC) 10/08/2019   Sleep apnea    Spinal stenosis    Syncope 04/03/2019   Tear of left rotator cuff 01/12/2019   Toxic metabolic encephalopathy 10/08/2019   UTI (urinary tract infection) 10/08/2019   Vascular dementia with behavioral disturbance (HCC) 07/06/2016   Visual hallucinations 05/01/2022   Past Surgical History:  Procedure Laterality Date   AMPUTATION TOE Left 01/01/2023   APPENDECTOMY     CARPAL TUNNEL RELEASE     CORONARY PRESSURE/FFR STUDY N/A 10/20/2019   Procedure: INTRAVASCULAR PRESSURE WIRE/FFR STUDY;  Surgeon: Lyn Records, MD;  Location: [facility];  Service: Cardiovascular;  Laterality: N/A;   EYE SURGERY     LEFT HEART CATH AND CORONARY ANGIOGRAPHY N/A 10/20/2019   Procedure: LEFT HEART CATH AND CORONARY ANGIOGRAPHY;  Surgeon: Lyn Records, MD;  Location: [facility];  Service: Cardiovascular;  Laterality: N/A;   SPINE SURGERY  1998, 2003, 2012   Laminectomy X 3   Family History  Problem Relation Age of Onset   Heart disease Mother    Hypertension Mother    Social History   Socioeconomic History   Marital status: Widowed    Spouse name: Not on file   Number of children: 4   Years of education: Not on file   Highest education level: 7th grade  Occupational History   Not on file  Tobacco Use   Smoking status: Every Day    Current packs/day: 0.75    Average packs/day: 0.8 packs/day for 57.6 years (43.2 ttl pk-yrs)    Types: Cigarettes    Start date: 35   Smokeless tobacco: Current    Types: Chew  Vaping Use   Vaping status: Never Used  Substance and Sexual Activity   Alcohol use: No    Drug use: No   Sexual activity: Not Currently  Other Topics Concern   Not on file  Social History Narrative   Not on file   Social Determinants of Health   Financial Resource Strain: Low Risk  (06/19/2023)   Overall Financial Resource Strain (CARDIA)    Difficulty of Paying Living Expenses: Not very hard  Food Insecurity: No Food Insecurity (06/19/2023)   Hunger Vital Sign    Worried About Running Out of Food in the Last Year: Never true    Ran Out of Food in the Last Year: Never true  Transportation Needs: No Transportation Needs (06/19/2023)   PRAPARE - Administrator, Civil Service (Medical): No    Lack of Transportation (Non-Medical): No  Physical Activity: Inactive (06/19/2023)   Exercise Vital Sign    Days of Exercise per Week: 0 days    Minutes of Exercise per Session: 0 min  Stress: No Stress Concern Present (06/19/2023)   [employer] of Occupational Health - Occupational Stress Questionnaire    Feeling of Stress : Not at all  Social Connections: Moderately Isolated (10/30/2022)   Social Connection and Isolation Panel [NHANES]    Frequency of Communication with Friends and Family: More than three times a week    Frequency of Social Gatherings with Friends and Family: More than three times a week    Attends Religious Services: More than 4 times per year    Active Member of [employer] or Organizations: No    Attends Banker Meetings: Never    Marital Status: Widowed    Tobacco Counseling Ready to quit: No Counseling given: Not Answered   Clinical Intake:  Pre-visit preparation completed: Yes Pain : 0-10 Pain Score: 8  Pain Type: Acute pain Pain Location: Leg Pain Orientation: Right Pain Descriptors / Indicators: Aching Pain Frequency: Intermittent   BMI - recorded: 32.09 Nutritional Status: BMI > 30  Obese Nutritional Risks: None Diabetes: Yes CBG done?: No How often do you need to have someone help you when you read instructions,  pamphlets, or other written materials from your doctor or pharmacy?: 2 - Rarely Interpreter Needed?: No     Activities of Daily Living    06/19/2023    2:06 PM 03/27/2023   11:26 AM  In your present state of health, do you have any difficulty performing the following activities:  Hearing? 0 0  Vision? 0 0  Difficulty concentrating or making decisions? 1 1  Walking or climbing stairs? 1 1  Dressing or bathing?  0 1  Doing errands, shopping? 0 1  Preparing Food and eating ? N   Using the Toilet? N   In the past six months, have you accidently leaked urine? N   Do you have problems with loss of bowel control? N   Managing your Medications? N   Managing your Finances? N   Housekeeping or managing your Housekeeping? Y     Patient Care Team: Blane Ohara, MD as PCP - General (Family Medicine) Thomasene Ripple, DO as PCP - Cardiology (Cardiology) Marlowe Sax, RN as Case Manager (General Practice) [facility] - patient was recently discharged due to noncompliance     Assessment:   This is a routine wellness examination for Dougal.  Hearing/Vision screen No results found.     Depression Screen    06/19/2023    2:06 PM 03/27/2023   11:25 AM 10/30/2022    4:14 PM 10/17/2022   10:53 AM 04/17/2022    1:57 PM 02/16/2022   11:21 AM 10/27/2021    9:00 AM  PHQ 2/9 Scores  PHQ - 2 Score 0 0 0 0 0 0 0  PHQ- 9 Score  2         Fall Risk    06/19/2023    2:05 PM 03/27/2023   11:25 AM 12/28/2022    8:40 AM 10/30/2022    3:56 PM 10/17/2022   10:53 AM  Fall Risk   Falls in the past year? 0 0 0 0 0  Number falls in past yr: 0 0 0 0 0  Injury with Fall? 0 0 0 0 0  Risk for fall due to :  No Fall Risks No Fall Risks Impaired balance/gait;Impaired mobility Impaired mobility;Impaired balance/gait  Follow up  Falls evaluation completed Falls evaluation completed Falls evaluation completed;Education provided;Falls prevention discussed Falls evaluation completed    MEDICARE RISK AT  HOME:  Medicare Risk at Home - 06/19/23 1405     Any stairs in or around the home? Yes   uses ramp   If so, are there any without handrails? No    Home free of loose throw rugs in walkways, pet beds, electrical cords, etc? Yes    Adequate lighting in your home to reduce risk of falls? Yes    Life alert? No    Use of a cane, walker or w/c? Yes    Grab bars in the bathroom? No    Shower chair or bench in shower? No    Elevated toilet seat or a handicapped toilet? No             TIMED UP AND GO:  Was the test performed?  No    Cognitive Function:    05/01/2022    4:26 PM 03/27/2021   11:32 AM  MMSE - Mini Mental State Exam  Orientation to time 2 4  Orientation to Place 5 5  Registration 3 3  Attention/ Calculation 0 0  Recall 3 0  Language- name 2 objects 2 2  Language- repeat 1 1  Language- follow 3 step command 3 3  Language- read & follow direction 1 1  Write a sentence 1 1  Copy design 1 0  Total score 22 20        06/19/2023    2:30 PM 04/17/2022   10:19 AM  6CIT Screen  What Year? 0 points 0 points  What month? 0 points 0 points  What time? 0 points 0 points  Count back from  20 2 points 2 points  Months in reverse 4 points 4 points  Repeat phrase 4 points 2 points  Total Score 10 points 8 points    Immunizations Immunization History  Administered Date(s) Administered   Fluad Quad(high Dose 65+) 08/15/2020, 10/17/2022   Influenza, Seasonal, Injecte, Preservative Fre 08/19/2018   Influenza-Unspecified 08/19/2018   Pneumococcal Conjugate,unspecified 08/19/2017   Pneumococcal Conjugate-13 08/19/2017   Pneumococcal Polysaccharide-23 08/20/2015    TDAP status: Due, Education has been provided regarding the importance of this vaccine. Advised may receive this vaccine at local pharmacy or Health Dept. Aware to provide a copy of the vaccination record if obtained from local pharmacy or Health Dept. Verbalized acceptance and understanding.  Flu Vaccine  status: Declined, Education has been provided regarding the importance of this vaccine but patient still declined. Advised may receive this vaccine at local pharmacy or Health Dept. Aware to provide a copy of the vaccination record if obtained from local pharmacy or Health Dept. Verbalized acceptance and understanding.  Pneumococcal vaccine status: Up to date  Covid-19 vaccine status: Declined, Education has been provided regarding the importance of this vaccine but patient still declined. Advised may receive this vaccine at local pharmacy or Health Dept.or vaccine clinic. Aware to provide a copy of the vaccination record if obtained from local pharmacy or Health Dept. Verbalized acceptance and understanding.  Qualifies for Shingles Vaccine? Yes   Zostavax completed No   Shingrix Completed?: No.    Education has been provided regarding the importance of this vaccine. Patient has been advised to call insurance company to determine out of pocket expense if they have not yet received this vaccine. Advised may also receive vaccine at local pharmacy or Health Dept. Verbalized acceptance and understanding.  Screening Tests Health Maintenance  Topic Date Due   Hepatitis C Screening  Never done   Medicare Annual Wellness (AWV)  04/18/2023   COVID-19 Vaccine (1) 06/20/2023 (Originally 08/15/1956)   Zoster Vaccines- Shingrix (1 of 2) 06/27/2023 (Originally 08/14/1970)   Lung Cancer Screening  03/26/2024 (Originally 08/31/2021)   Colonoscopy  03/26/2024 (Originally 09/16/2015)   INFLUENZA VACCINE  06/20/2023   Pneumonia Vaccine 90+ Years old  Completed   HPV VACCINES  Aged Out   DTaP/Tdap/Td  Discontinued    Health Maintenance  Health Maintenance Due  Topic Date Due   Hepatitis C Screening  Never done   Medicare Annual Wellness (AWV)  04/18/2023    Colorectal cancer screening: Type of screening: Colonoscopy. Completed 2015. Repeat every 10 years, patient declined  Lung Cancer Screening: (Low  Dose CT Chest recommended if Age 21-80 years, 20 pack-year currently smoking OR have quit w/in 15years.) does qualify.   Lung Cancer Screening Referral: Declined by patient  Additional Screening:  Vision Screening: Recommended annual ophthalmology exams for early detection of glaucoma and other disorders of the eye. Is the patient up to date with their annual eye exam?  No   Dental Screening: Recommended annual dental exams for proper oral hygiene   Community Resource Referral / Chronic Care Management: CRR required this visit?  No   CCM required this visit?  No     Plan:    1- Counseling was provided today regarding the following topics: healthy eating habits, home safety, vitamin and mineral supplementation, regular exercise, tobacco avoidance, limitation of alcohol intake, use of seat belts, firearm safety, and fall prevention.  Annual recommendations include: influenza vaccine, dental cleanings, and eye exams.  Patient declined colorectal cancer screening and lung cancer screening  I have personally reviewed and noted the following in the patient's chart:   Medical and social history Use of alcohol, tobacco or illicit drugs  Current medications and supplements including opioid prescriptions.  Functional ability and status Nutritional status Physical activity Advanced directives List of other physicians Hospitalizations, surgeries, and ER visits in previous 12 months Historical Vitals Screenings to include cognitive, depression, and falls Referrals and appointments  In addition, I have reviewed and discussed with patient certain preventive protocols, quality metrics, and best practice recommendations. A written personalized care plan for preventive services as well as general preventive health recommendations were provided to patient.     Jacklynn Bue, LPN   [id number]/62/[id number]   After Visit Summary: (Mail) Due to this being a telephonic visit, the after visit summary  with patients personalized plan was offered to patient via mail

## 2023-06-19 NOTE — Patient Instructions (Signed)
[name] , Thank you for taking time to come for your Medicare Wellness Visit. I appreciate your ongoing commitment to your health goals. Please review the following plan we discussed and let me know if I can assist you in the future.    This is a list of the screening recommended for you and due dates:  Health Maintenance  Topic Date Due   Hepatitis C Screening  Never done   COVID-19 Vaccine (1) 06/20/2023*   Zoster (Shingles) Vaccine (1 of 2) 06/27/2023*   Screening for Lung Cancer  03/26/2024*   Colon Cancer Screening  03/26/2024*   Flu Shot  06/20/2023   Medicare Annual Wellness Visit  06/18/2024   Pneumonia Vaccine  Completed   HPV Vaccine  Aged Out   DTaP/Tdap/Td vaccine  Discontinued  *Topic was postponed. The date shown is not the original due date.    Preventive Care 73 Years and Older, Male  Preventive care refers to lifestyle choices and visits with your health care provider that can promote health and wellness. What does preventive care include? A yearly physical exam. This is also called an annual well check. Dental exams once or twice a year. Routine eye exams. Ask your health care provider how often you should have your eyes checked. Personal lifestyle choices, including: Daily care of your teeth and gums. Regular physical activity. Eating a healthy diet. Avoiding tobacco and drug use. Limiting alcohol use. Practicing safe sex. Taking low doses of aspirin every day. Taking vitamin and mineral supplements as recommended by your health care provider. What happens during an annual well check? The services and screenings done by your health care provider during your annual well check will depend on your age, overall health, lifestyle risk factors, and family history of disease. Counseling  Your health care provider may ask you questions about your: Alcohol use. Tobacco use. Drug use. Emotional well-being. Home and relationship well-being. Sexual  activity. Eating habits. History of falls. Memory and ability to understand (cognition). Work and work Astronomer. Screening  You may have the following tests or measurements: Height, weight, and BMI. Blood pressure. Lipid and cholesterol levels. These may be checked every 5 years, or more frequently if you are over 18 years old. Skin check. Lung cancer screening. You may have this screening every year starting at age 35 if you have a 30-pack-year history of smoking and currently smoke or have quit within the past 15 years. Fecal occult blood test (FOBT) of the stool. You may have this test every year starting at age 18. Flexible sigmoidoscopy or colonoscopy. You may have a sigmoidoscopy every 5 years or a colonoscopy every 10 years starting at age 72. Prostate cancer screening. Recommendations will vary depending on your family history and other risks. Hepatitis C blood test. Hepatitis B blood test. Sexually transmitted disease (STD) testing. Diabetes screening. This is done by checking your blood sugar (glucose) after you have not eaten for a while (fasting). You may have this done every 1-3 years. Abdominal aortic aneurysm (AAA) screening. You may need this if you are a current or former smoker. Osteoporosis. You may be screened starting at age 27 if you are at high risk. Talk with your health care provider about your test results, treatment options, and if necessary, the need for more tests. Vaccines  Your health care provider may recommend certain vaccines, such as: Influenza vaccine. This is recommended every year. Tetanus, diphtheria, and acellular pertussis (Tdap, Td) vaccine. You may need a Td booster  every 10 years. Zoster vaccine. You may need this after age 19. Pneumococcal 13-valent conjugate (PCV13) vaccine. One dose is recommended after age 2. Pneumococcal polysaccharide (PPSV23) vaccine. One dose is recommended after age 77. Talk to your health care provider about which  screenings and vaccines you need and how often you need them. This information is not intended to replace advice given to you by your health care provider. Make sure you discuss any questions you have with your health care provider. Document Released: 12/02/2015 Document Revised: 07/25/2016 Document Reviewed: 09/06/2015 Elsevier Interactive Patient Education  2017 [employer].  Fall Prevention in the Home Falls can cause injuries. They can happen to people of all ages. There are many things you can do to make your home safe and to help prevent falls. What can I do on the outside of my home? Regularly fix the edges of walkways and driveways and fix any cracks. Remove anything that might make you trip as you walk through a door, such as a raised step or threshold. Trim any bushes or trees on the path to your home. Use bright outdoor lighting. Clear any walking paths of anything that might make someone trip, such as rocks or tools. Regularly check to see if handrails are loose or broken. Make sure that both sides of any steps have handrails. Any raised decks and porches should have guardrails on the edges. Have any leaves, snow, or ice cleared regularly. Use sand or salt on walking paths during winter. Clean up any spills in your garage right away. This includes oil or grease spills. What can I do in the bathroom? Use night lights. Install grab bars by the toilet and in the tub and shower. Do not use towel bars as grab bars. Use non-skid mats or decals in the tub or shower. If you need to sit down in the shower, use a plastic, non-slip stool. Keep the floor dry. Clean up any water that spills on the floor as soon as it happens. Remove soap buildup in the tub or shower regularly. Attach bath mats securely with double-sided non-slip rug tape. Do not have throw rugs and other things on the floor that can make you trip. What can I do in the bedroom? Use night lights. Make sure that you have a  light by your bed that is easy to reach. Do not use any sheets or blankets that are too big for your bed. They should not hang down onto the floor. Have a firm chair that has side arms. You can use this for support while you get dressed. Do not have throw rugs and other things on the floor that can make you trip. What can I do in the kitchen? Clean up any spills right away. Avoid walking on wet floors. Keep items that you use a lot in easy-to-reach places. If you need to reach something above you, use a strong step stool that has a grab bar. Keep electrical cords out of the way. Do not use floor polish or wax that makes floors slippery. If you must use wax, use non-skid floor wax. Do not have throw rugs and other things on the floor that can make you trip. What can I do with my stairs? Do not leave any items on the stairs. Make sure that there are handrails on both sides of the stairs and use them. Fix handrails that are broken or loose. Make sure that handrails are as long as the stairways. Check any carpeting to  make sure that it is firmly attached to the stairs. Fix any carpet that is loose or worn. Avoid having throw rugs at the top or bottom of the stairs. If you do have throw rugs, attach them to the floor with carpet tape. Make sure that you have a light switch at the top of the stairs and the bottom of the stairs. If you do not have them, ask someone to add them for you. What else can I do to help prevent falls? Wear shoes that: Do not have high heels. Have rubber bottoms. Are comfortable and fit you well. Are closed at the toe. Do not wear sandals. If you use a stepladder: Make sure that it is fully opened. Do not climb a closed stepladder. Make sure that both sides of the stepladder are locked into place. Ask someone to hold it for you, if possible. Clearly mark and make sure that you can see: Any grab bars or handrails. First and last steps. Where the edge of each step  is. Use tools that help you move around (mobility aids) if they are needed. These include: Canes. Walkers. Scooters. Crutches. Turn on the lights when you go into a dark area. Replace any light bulbs as soon as they burn out. Set up your furniture so you have a clear path. Avoid moving your furniture around. If any of your floors are uneven, fix them. If there are any pets around you, be aware of where they are. Review your medicines with your doctor. Some medicines can make you feel dizzy. This can increase your chance of falling. Ask your doctor what other things that you can do to help prevent falls. This information is not intended to replace advice given to you by your health care provider. Make sure you discuss any questions you have with your health care provider. Document Released: 09/01/2009 Document Revised: 04/12/2016 Document Reviewed: 12/10/2014 Elsevier Interactive Patient Education  2017 [employer].

## 2023-06-24 ENCOUNTER — Other Ambulatory Visit: Payer: Self-pay

## 2023-06-24 ENCOUNTER — Ambulatory Visit: Payer: Medicare Other

## 2023-06-24 DIAGNOSIS — I890 Lymphedema, not elsewhere classified: Secondary | ICD-10-CM | POA: Diagnosis not present

## 2023-06-24 DIAGNOSIS — Z993 Dependence on wheelchair: Secondary | ICD-10-CM | POA: Diagnosis not present

## 2023-06-24 DIAGNOSIS — I872 Venous insufficiency (chronic) (peripheral): Secondary | ICD-10-CM | POA: Diagnosis not present

## 2023-06-24 DIAGNOSIS — R296 Repeated falls: Secondary | ICD-10-CM | POA: Diagnosis not present

## 2023-06-24 DIAGNOSIS — L97919 Non-pressure chronic ulcer of unspecified part of right lower leg with unspecified severity: Secondary | ICD-10-CM | POA: Diagnosis not present

## 2023-06-24 DIAGNOSIS — L89893 Pressure ulcer of other site, stage 3: Secondary | ICD-10-CM | POA: Diagnosis not present

## 2023-06-24 DIAGNOSIS — I83018 Varicose veins of right lower extremity with ulcer other part of lower leg: Secondary | ICD-10-CM | POA: Diagnosis not present

## 2023-06-25 ENCOUNTER — Other Ambulatory Visit: Payer: Self-pay

## 2023-06-26 ENCOUNTER — Other Ambulatory Visit: Payer: Self-pay | Admitting: Family Medicine

## 2023-06-27 ENCOUNTER — Other Ambulatory Visit: Payer: Self-pay

## 2023-06-27 DIAGNOSIS — J449 Chronic obstructive pulmonary disease, unspecified: Secondary | ICD-10-CM | POA: Diagnosis not present

## 2023-07-01 ENCOUNTER — Other Ambulatory Visit: Payer: Self-pay

## 2023-07-02 ENCOUNTER — Other Ambulatory Visit: Payer: Self-pay

## 2023-07-02 DIAGNOSIS — J441 Chronic obstructive pulmonary disease with (acute) exacerbation: Secondary | ICD-10-CM

## 2023-07-02 MED ORDER — TRELEGY ELLIPTA 100-62.5-25 MCG/ACT IN AEPB
1.0000 | INHALATION_SPRAY | Freq: Every day | RESPIRATORY_TRACT | 11 refills | Status: DC
Start: 2023-07-02 — End: 2023-11-23

## 2023-07-05 DIAGNOSIS — I83018 Varicose veins of right lower extremity with ulcer other part of lower leg: Secondary | ICD-10-CM | POA: Diagnosis not present

## 2023-07-05 DIAGNOSIS — R296 Repeated falls: Secondary | ICD-10-CM | POA: Diagnosis not present

## 2023-07-05 DIAGNOSIS — L89893 Pressure ulcer of other site, stage 3: Secondary | ICD-10-CM | POA: Diagnosis not present

## 2023-07-05 DIAGNOSIS — L97919 Non-pressure chronic ulcer of unspecified part of right lower leg with unspecified severity: Secondary | ICD-10-CM | POA: Diagnosis not present

## 2023-07-05 DIAGNOSIS — Z993 Dependence on wheelchair: Secondary | ICD-10-CM | POA: Diagnosis not present

## 2023-07-05 DIAGNOSIS — I872 Venous insufficiency (chronic) (peripheral): Secondary | ICD-10-CM | POA: Diagnosis not present

## 2023-07-05 DIAGNOSIS — I890 Lymphedema, not elsewhere classified: Secondary | ICD-10-CM | POA: Diagnosis not present

## 2023-07-08 ENCOUNTER — Other Ambulatory Visit: Payer: Self-pay | Admitting: Family Medicine

## 2023-07-12 ENCOUNTER — Ambulatory Visit: Payer: Medicare Other

## 2023-07-12 NOTE — Progress Notes (Deleted)
72 year old male patient, last visit at our office was March 2021 with Dr. Servando Salina.   History of CHF mildly reduced EF, CAD two-vessel disease, atrial fibrillation, hypertension, hyperlipidemia, smoking, obstructive sleep apnea, obesity,  History of recurrent syncope in October 2020 was admitted at [facility] and noted to have cardiomyopathy with LVEF reduced to 40 to 45% by echocardiogram with moderate hypokinesis globally and apical akinesis, underwent ischemic workup with December 2020 that showed moderate two-vessel disease DFR of mid LAD lesion not significant, mid PDA 70% stenosis and with LVEDP elevated at 22 mmHg.  Atrial fibrillation appears to have been incidentally diagnosed at a recent outpatient visit with foot ulcer clinic where his heart rate was elevated and EKG confirmed atrial fibrillation on May 17, 2023 .  20-day event monitor November 2020 was unremarkable study with sinus rhythm, no significant PACs or PVCs and saved tracings noted PACs].  Last echocardiogram to review is from April 2021 EF 45 to 50%, mildly decreased, akinetic interventricular septum, normal RV function,  Recent lab work May 22, 2023 with TSH normal at 1.85 CMP showing BUN 13, creatinine 0.55, EGFR 105, sodium 147, potassium 4 Normal transaminases and alkaline phosphatase Hemoglobin 12.8, hematocrit 40.7, WBC 10.5, platelets 230 Last lipid panel available to review from Mar 27, 2023 total cholesterol 143, triglycerides 140, HDL 44, LDL 74, suggest near optimal control Hemoglobin A1c 5.9 on Mar 27, 2023, borderline   Prior EKG May 22, 2023 at PCPs office noted atrial fibrillation with heart rate in 80s.

## 2023-07-15 DIAGNOSIS — I83018 Varicose veins of right lower extremity with ulcer other part of lower leg: Secondary | ICD-10-CM | POA: Diagnosis not present

## 2023-07-15 DIAGNOSIS — L89893 Pressure ulcer of other site, stage 3: Secondary | ICD-10-CM | POA: Diagnosis not present

## 2023-07-15 DIAGNOSIS — I890 Lymphedema, not elsewhere classified: Secondary | ICD-10-CM | POA: Diagnosis not present

## 2023-07-15 DIAGNOSIS — L97919 Non-pressure chronic ulcer of unspecified part of right lower leg with unspecified severity: Secondary | ICD-10-CM | POA: Diagnosis not present

## 2023-07-15 DIAGNOSIS — I872 Venous insufficiency (chronic) (peripheral): Secondary | ICD-10-CM | POA: Diagnosis not present

## 2023-07-15 DIAGNOSIS — Z993 Dependence on wheelchair: Secondary | ICD-10-CM | POA: Diagnosis not present

## 2023-07-15 DIAGNOSIS — R296 Repeated falls: Secondary | ICD-10-CM | POA: Diagnosis not present

## 2023-07-23 ENCOUNTER — Other Ambulatory Visit: Payer: Self-pay | Admitting: Family Medicine

## 2023-07-23 DIAGNOSIS — I5042 Chronic combined systolic (congestive) and diastolic (congestive) heart failure: Secondary | ICD-10-CM

## 2023-07-23 DIAGNOSIS — I83018 Varicose veins of right lower extremity with ulcer other part of lower leg: Secondary | ICD-10-CM | POA: Diagnosis not present

## 2023-07-23 DIAGNOSIS — L97919 Non-pressure chronic ulcer of unspecified part of right lower leg with unspecified severity: Secondary | ICD-10-CM | POA: Diagnosis not present

## 2023-07-23 DIAGNOSIS — I872 Venous insufficiency (chronic) (peripheral): Secondary | ICD-10-CM | POA: Diagnosis not present

## 2023-07-23 DIAGNOSIS — L89893 Pressure ulcer of other site, stage 3: Secondary | ICD-10-CM | POA: Diagnosis not present

## 2023-07-23 DIAGNOSIS — Z993 Dependence on wheelchair: Secondary | ICD-10-CM | POA: Diagnosis not present

## 2023-07-28 DIAGNOSIS — J449 Chronic obstructive pulmonary disease, unspecified: Secondary | ICD-10-CM | POA: Diagnosis not present

## 2023-07-30 DIAGNOSIS — Z993 Dependence on wheelchair: Secondary | ICD-10-CM | POA: Diagnosis not present

## 2023-07-30 DIAGNOSIS — I83018 Varicose veins of right lower extremity with ulcer other part of lower leg: Secondary | ICD-10-CM | POA: Diagnosis not present

## 2023-07-30 DIAGNOSIS — L89893 Pressure ulcer of other site, stage 3: Secondary | ICD-10-CM | POA: Diagnosis not present

## 2023-07-30 DIAGNOSIS — L97919 Non-pressure chronic ulcer of unspecified part of right lower leg with unspecified severity: Secondary | ICD-10-CM | POA: Diagnosis not present

## 2023-07-30 DIAGNOSIS — I872 Venous insufficiency (chronic) (peripheral): Secondary | ICD-10-CM | POA: Diagnosis not present

## 2023-08-01 DIAGNOSIS — I890 Lymphedema, not elsewhere classified: Secondary | ICD-10-CM | POA: Diagnosis not present

## 2023-08-06 ENCOUNTER — Other Ambulatory Visit: Payer: Self-pay | Admitting: Family Medicine

## 2023-08-06 DIAGNOSIS — L89899 Pressure ulcer of other site, unspecified stage: Secondary | ICD-10-CM | POA: Diagnosis not present

## 2023-08-06 DIAGNOSIS — R296 Repeated falls: Secondary | ICD-10-CM | POA: Diagnosis not present

## 2023-08-06 DIAGNOSIS — L97919 Non-pressure chronic ulcer of unspecified part of right lower leg with unspecified severity: Secondary | ICD-10-CM | POA: Diagnosis not present

## 2023-08-06 DIAGNOSIS — L89893 Pressure ulcer of other site, stage 3: Secondary | ICD-10-CM | POA: Diagnosis not present

## 2023-08-06 DIAGNOSIS — I83018 Varicose veins of right lower extremity with ulcer other part of lower leg: Secondary | ICD-10-CM | POA: Diagnosis not present

## 2023-08-06 DIAGNOSIS — Z993 Dependence on wheelchair: Secondary | ICD-10-CM | POA: Diagnosis not present

## 2023-08-13 DIAGNOSIS — L89899 Pressure ulcer of other site, unspecified stage: Secondary | ICD-10-CM | POA: Diagnosis not present

## 2023-08-13 DIAGNOSIS — R296 Repeated falls: Secondary | ICD-10-CM | POA: Diagnosis not present

## 2023-08-13 DIAGNOSIS — L89893 Pressure ulcer of other site, stage 3: Secondary | ICD-10-CM | POA: Diagnosis not present

## 2023-08-13 DIAGNOSIS — L97919 Non-pressure chronic ulcer of unspecified part of right lower leg with unspecified severity: Secondary | ICD-10-CM | POA: Diagnosis not present

## 2023-08-13 DIAGNOSIS — Z993 Dependence on wheelchair: Secondary | ICD-10-CM | POA: Diagnosis not present

## 2023-08-13 DIAGNOSIS — I83018 Varicose veins of right lower extremity with ulcer other part of lower leg: Secondary | ICD-10-CM | POA: Diagnosis not present

## 2023-08-21 ENCOUNTER — Other Ambulatory Visit: Payer: Self-pay | Admitting: Family Medicine

## 2023-08-21 DIAGNOSIS — G2581 Restless legs syndrome: Secondary | ICD-10-CM

## 2023-08-22 ENCOUNTER — Other Ambulatory Visit: Payer: Self-pay | Admitting: Family Medicine

## 2023-08-22 DIAGNOSIS — J441 Chronic obstructive pulmonary disease with (acute) exacerbation: Secondary | ICD-10-CM

## 2023-08-27 DIAGNOSIS — J449 Chronic obstructive pulmonary disease, unspecified: Secondary | ICD-10-CM | POA: Diagnosis not present

## 2023-08-31 DIAGNOSIS — I890 Lymphedema, not elsewhere classified: Secondary | ICD-10-CM | POA: Diagnosis not present

## 2023-09-09 ENCOUNTER — Ambulatory Visit: Payer: Medicare Other | Admitting: Family Medicine

## 2023-09-09 ENCOUNTER — Encounter: Payer: Self-pay | Admitting: Family Medicine

## 2023-09-09 VITALS — BP 110/60 | HR 76 | Temp 95.3°F | Resp 18 | Ht 70.0 in | Wt 325.0 lb

## 2023-09-09 DIAGNOSIS — J441 Chronic obstructive pulmonary disease with (acute) exacerbation: Secondary | ICD-10-CM

## 2023-09-09 DIAGNOSIS — E782 Mixed hyperlipidemia: Secondary | ICD-10-CM

## 2023-09-09 DIAGNOSIS — I4891 Unspecified atrial fibrillation: Secondary | ICD-10-CM

## 2023-09-09 DIAGNOSIS — I110 Hypertensive heart disease with heart failure: Secondary | ICD-10-CM | POA: Diagnosis not present

## 2023-09-09 DIAGNOSIS — I5042 Chronic combined systolic (congestive) and diastolic (congestive) heart failure: Secondary | ICD-10-CM | POA: Diagnosis not present

## 2023-09-09 DIAGNOSIS — R7303 Prediabetes: Secondary | ICD-10-CM | POA: Diagnosis not present

## 2023-09-09 DIAGNOSIS — R296 Repeated falls: Secondary | ICD-10-CM

## 2023-09-09 DIAGNOSIS — E66813 Obesity, class 3: Secondary | ICD-10-CM

## 2023-09-09 DIAGNOSIS — Z6841 Body Mass Index (BMI) 40.0 and over, adult: Secondary | ICD-10-CM | POA: Insufficient documentation

## 2023-09-09 DIAGNOSIS — S81812A Laceration without foreign body, left lower leg, initial encounter: Secondary | ICD-10-CM

## 2023-09-09 MED ORDER — CEFTRIAXONE SODIUM 1 G IJ SOLR
1.0000 g | Freq: Once | INTRAMUSCULAR | Status: AC
Start: 2023-09-09 — End: 2023-09-09
  Administered 2023-09-09: 1 g via INTRAMUSCULAR

## 2023-09-09 MED ORDER — DOXYCYCLINE HYCLATE 100 MG PO TABS: 100.0000 mg | ORAL_TABLET | Freq: Two times a day (BID) | ORAL | 0 refills | Status: DC

## 2023-09-09 MED ORDER — IPRATROPIUM-ALBUTEROL 0.5-2.5 (3) MG/3ML IN SOLN
3.0000 mL | Freq: Once | RESPIRATORY_TRACT | Status: AC
Start: 2023-09-09 — End: 2023-09-09
  Administered 2023-09-09: 3 mL via RESPIRATORY_TRACT

## 2023-09-09 MED ORDER — PREDNISONE 20 MG PO TABS
ORAL_TABLET | ORAL | 0 refills | Status: AC
Start: 2023-09-09 — End: 2023-09-17

## 2023-09-09 MED ORDER — TRIAMCINOLONE ACETONIDE 40 MG/ML IJ SUSP
80.0000 mg | Freq: Once | INTRAMUSCULAR | Status: AC
Start: 2023-09-09 — End: 2023-09-09
  Administered 2023-09-09: 80 mg via INTRAMUSCULAR
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
## 2023-09-10 ENCOUNTER — Other Ambulatory Visit: Payer: Self-pay | Admitting: Family Medicine

## 2023-09-10 LAB — CBC WITH DIFFERENTIAL/PLATELET
Basophils Absolute: 0.1 10*3/uL (ref 0.0–0.2)
Basos: 1 %
EOS (ABSOLUTE): 0.2 10*3/uL (ref 0.0–0.4)
Eos: 2 %
Hematocrit: 41.1 % (ref 37.5–51.0)
Hemoglobin: 12.6 g/dL — ABNORMAL LOW (ref 13.0–17.7)
Immature Grans (Abs): 0 10*3/uL (ref 0.0–0.1)
Immature Granulocytes: 0 %
Lymphocytes Absolute: 2.7 10*3/uL (ref 0.7–3.1)
Lymphs: 22 %
MCH: 27.1 pg (ref 26.6–33.0)
MCHC: 30.7 g/dL — ABNORMAL LOW (ref 31.5–35.7)
MCV: 88 fL (ref 79–97)
Monocytes Absolute: 0.6 10*3/uL (ref 0.1–0.9)
Monocytes: 5 %
Neutrophils Absolute: 8.3 10*3/uL — ABNORMAL HIGH (ref 1.4–7.0)
Neutrophils: 70 %
Platelets: 283 10*3/uL (ref 150–450)
RBC: 4.65 x10E6/uL (ref 4.14–5.80)
RDW: 14.5 % (ref 11.6–15.4)
WBC: 11.9 10*3/uL — ABNORMAL HIGH (ref 3.4–10.8)

## 2023-09-10 LAB — COMPREHENSIVE METABOLIC PANEL
ALT: 9 [IU]/L (ref 0–44)
AST: 12 [IU]/L (ref 0–40)
Albumin: 3.2 g/dL — ABNORMAL LOW (ref 3.8–4.8)
Alkaline Phosphatase: 105 [IU]/L (ref 44–121)
BUN/Creatinine Ratio: 15 (ref 10–24)
BUN: 8 mg/dL (ref 8–27)
Bilirubin Total: 0.3 mg/dL (ref 0.0–1.2)
CO2: 32 mmol/L — ABNORMAL HIGH (ref 20–29)
Calcium: 8.1 mg/dL — ABNORMAL LOW (ref 8.6–10.2)
Chloride: 101 mmol/L (ref 96–106)
Creatinine, Ser: 0.54 mg/dL — ABNORMAL LOW (ref 0.76–1.27)
Globulin, Total: 2.6 g/dL (ref 1.5–4.5)
Glucose: 76 mg/dL (ref 70–99)
Potassium: 3.1 mmol/L — ABNORMAL LOW (ref 3.5–5.2)
Sodium: 150 mmol/L — ABNORMAL HIGH (ref 134–144)
Total Protein: 5.8 g/dL — ABNORMAL LOW (ref 6.0–8.5)
eGFR: 105 mL/min/{1.73_m2} (ref >59)

## 2023-09-10 LAB — HEMOGLOBIN A1C
Est. average glucose Bld gHb Est-mCnc: 117 mg/dL
Hgb A1c MFr Bld: 5.7 % — ABNORMAL HIGH (ref 4.8–5.6)

## 2023-09-10 MED ORDER — POTASSIUM CHLORIDE CRYS ER 20 MEQ PO TBCR: 20.0000 meq | EXTENDED_RELEASE_TABLET | Freq: Every day | ORAL | 0 refills | Status: DC

## 2023-09-14 DIAGNOSIS — R296 Repeated falls: Secondary | ICD-10-CM | POA: Insufficient documentation

## 2023-09-14 NOTE — Assessment & Plan Note (Signed)
Management per specialist. 

## 2023-09-14 NOTE — Assessment & Plan Note (Signed)
Hemoglobin A1c 5.8%, 3 month avg of blood sugars, is in prediabetic range.  In order to prevent progression to diabetes, recommend low carb diet and regular exercise  

## 2023-09-14 NOTE — Assessment & Plan Note (Signed)
Management per specialist. Dr. Servando Salina Continue propranolol er 60 mg daily and lasix 40 mg daily as needed

## 2023-09-14 NOTE — Assessment & Plan Note (Signed)
Recommend continue to work on eating healthy diet and exercise.  

## 2023-09-14 NOTE — Assessment & Plan Note (Signed)
Well controlled.  No changes to medicines. Rosuvastatin 5 mg daily, lovaza 2g BID. Continue to work on eating a healthy diet and exercise.

## 2023-09-14 NOTE — Assessment & Plan Note (Signed)
Ordered nebulizer with duoneb. Given kenalog 80 mg IM #1 and rocephin 1 g IM #1 at the office.

## 2023-09-14 NOTE — Assessment & Plan Note (Signed)
Education provided

## 2023-09-15 DIAGNOSIS — J441 Chronic obstructive pulmonary disease with (acute) exacerbation: Secondary | ICD-10-CM | POA: Insufficient documentation

## 2023-09-15 NOTE — Assessment & Plan Note (Signed)
Bandaged.

## 2023-09-19 ENCOUNTER — Other Ambulatory Visit: Payer: Self-pay | Admitting: Family Medicine

## 2023-09-19 DIAGNOSIS — S81812D Laceration without foreign body, left lower leg, subsequent encounter: Secondary | ICD-10-CM | POA: Diagnosis not present

## 2023-09-19 DIAGNOSIS — K219 Gastro-esophageal reflux disease without esophagitis: Secondary | ICD-10-CM | POA: Diagnosis not present

## 2023-09-19 DIAGNOSIS — F32A Depression, unspecified: Secondary | ICD-10-CM | POA: Diagnosis not present

## 2023-09-19 DIAGNOSIS — I110 Hypertensive heart disease with heart failure: Secondary | ICD-10-CM | POA: Diagnosis not present

## 2023-09-19 DIAGNOSIS — E66813 Obesity, class 3: Secondary | ICD-10-CM | POA: Diagnosis not present

## 2023-09-19 DIAGNOSIS — I5042 Chronic combined systolic (congestive) and diastolic (congestive) heart failure: Secondary | ICD-10-CM | POA: Diagnosis not present

## 2023-09-19 DIAGNOSIS — G309 Alzheimer's disease, unspecified: Secondary | ICD-10-CM | POA: Diagnosis not present

## 2023-09-19 DIAGNOSIS — E782 Mixed hyperlipidemia: Secondary | ICD-10-CM | POA: Diagnosis not present

## 2023-09-19 DIAGNOSIS — E559 Vitamin D deficiency, unspecified: Secondary | ICD-10-CM | POA: Diagnosis not present

## 2023-09-19 DIAGNOSIS — G629 Polyneuropathy, unspecified: Secondary | ICD-10-CM | POA: Diagnosis not present

## 2023-09-19 DIAGNOSIS — Z6841 Body Mass Index (BMI) 40.0 and over, adult: Secondary | ICD-10-CM | POA: Diagnosis not present

## 2023-09-19 DIAGNOSIS — M109 Gout, unspecified: Secondary | ICD-10-CM | POA: Diagnosis not present

## 2023-09-19 DIAGNOSIS — I2510 Atherosclerotic heart disease of native coronary artery without angina pectoris: Secondary | ICD-10-CM | POA: Diagnosis not present

## 2023-09-19 DIAGNOSIS — Z7951 Long term (current) use of inhaled steroids: Secondary | ICD-10-CM | POA: Diagnosis not present

## 2023-09-19 DIAGNOSIS — M069 Rheumatoid arthritis, unspecified: Secondary | ICD-10-CM | POA: Diagnosis not present

## 2023-09-19 DIAGNOSIS — G894 Chronic pain syndrome: Secondary | ICD-10-CM | POA: Diagnosis not present

## 2023-09-19 DIAGNOSIS — M1990 Unspecified osteoarthritis, unspecified site: Secondary | ICD-10-CM | POA: Diagnosis not present

## 2023-09-19 DIAGNOSIS — J441 Chronic obstructive pulmonary disease with (acute) exacerbation: Secondary | ICD-10-CM | POA: Diagnosis not present

## 2023-09-19 DIAGNOSIS — F0283 Dementia in other diseases classified elsewhere, unspecified severity, with mood disturbance: Secondary | ICD-10-CM | POA: Diagnosis not present

## 2023-09-19 DIAGNOSIS — R7303 Prediabetes: Secondary | ICD-10-CM | POA: Diagnosis not present

## 2023-09-19 DIAGNOSIS — Z9981 Dependence on supplemental oxygen: Secondary | ICD-10-CM | POA: Diagnosis not present

## 2023-09-19 DIAGNOSIS — I4891 Unspecified atrial fibrillation: Secondary | ICD-10-CM | POA: Diagnosis not present

## 2023-09-21 ENCOUNTER — Other Ambulatory Visit: Payer: Self-pay | Admitting: Family Medicine

## 2023-09-21 DIAGNOSIS — R351 Nocturia: Secondary | ICD-10-CM

## 2023-09-24 DIAGNOSIS — R7303 Prediabetes: Secondary | ICD-10-CM | POA: Diagnosis not present

## 2023-09-24 DIAGNOSIS — K219 Gastro-esophageal reflux disease without esophagitis: Secondary | ICD-10-CM | POA: Diagnosis not present

## 2023-09-24 DIAGNOSIS — I110 Hypertensive heart disease with heart failure: Secondary | ICD-10-CM | POA: Diagnosis not present

## 2023-09-24 DIAGNOSIS — I4891 Unspecified atrial fibrillation: Secondary | ICD-10-CM | POA: Diagnosis not present

## 2023-09-24 DIAGNOSIS — E782 Mixed hyperlipidemia: Secondary | ICD-10-CM | POA: Diagnosis not present

## 2023-09-24 DIAGNOSIS — G894 Chronic pain syndrome: Secondary | ICD-10-CM | POA: Diagnosis not present

## 2023-09-24 DIAGNOSIS — Z6841 Body Mass Index (BMI) 40.0 and over, adult: Secondary | ICD-10-CM | POA: Diagnosis not present

## 2023-09-24 DIAGNOSIS — I5042 Chronic combined systolic (congestive) and diastolic (congestive) heart failure: Secondary | ICD-10-CM | POA: Diagnosis not present

## 2023-09-24 DIAGNOSIS — E559 Vitamin D deficiency, unspecified: Secondary | ICD-10-CM | POA: Diagnosis not present

## 2023-09-24 DIAGNOSIS — J441 Chronic obstructive pulmonary disease with (acute) exacerbation: Secondary | ICD-10-CM | POA: Diagnosis not present

## 2023-09-24 DIAGNOSIS — I2510 Atherosclerotic heart disease of native coronary artery without angina pectoris: Secondary | ICD-10-CM | POA: Diagnosis not present

## 2023-09-24 DIAGNOSIS — M1990 Unspecified osteoarthritis, unspecified site: Secondary | ICD-10-CM | POA: Diagnosis not present

## 2023-09-24 DIAGNOSIS — M109 Gout, unspecified: Secondary | ICD-10-CM | POA: Diagnosis not present

## 2023-09-24 DIAGNOSIS — G309 Alzheimer's disease, unspecified: Secondary | ICD-10-CM | POA: Diagnosis not present

## 2023-09-24 DIAGNOSIS — Z9981 Dependence on supplemental oxygen: Secondary | ICD-10-CM | POA: Diagnosis not present

## 2023-09-24 DIAGNOSIS — F0283 Dementia in other diseases classified elsewhere, unspecified severity, with mood disturbance: Secondary | ICD-10-CM | POA: Diagnosis not present

## 2023-09-24 DIAGNOSIS — M069 Rheumatoid arthritis, unspecified: Secondary | ICD-10-CM | POA: Diagnosis not present

## 2023-09-24 DIAGNOSIS — F32A Depression, unspecified: Secondary | ICD-10-CM | POA: Diagnosis not present

## 2023-09-24 DIAGNOSIS — E66813 Obesity, class 3: Secondary | ICD-10-CM | POA: Diagnosis not present

## 2023-09-24 DIAGNOSIS — G629 Polyneuropathy, unspecified: Secondary | ICD-10-CM | POA: Diagnosis not present

## 2023-09-24 DIAGNOSIS — S81812D Laceration without foreign body, left lower leg, subsequent encounter: Secondary | ICD-10-CM | POA: Diagnosis not present

## 2023-09-24 DIAGNOSIS — Z7951 Long term (current) use of inhaled steroids: Secondary | ICD-10-CM | POA: Diagnosis not present

## 2023-09-25 DIAGNOSIS — G629 Polyneuropathy, unspecified: Secondary | ICD-10-CM | POA: Diagnosis not present

## 2023-09-25 DIAGNOSIS — E559 Vitamin D deficiency, unspecified: Secondary | ICD-10-CM | POA: Diagnosis not present

## 2023-09-25 DIAGNOSIS — E66813 Obesity, class 3: Secondary | ICD-10-CM | POA: Diagnosis not present

## 2023-09-25 DIAGNOSIS — I4891 Unspecified atrial fibrillation: Secondary | ICD-10-CM | POA: Diagnosis not present

## 2023-09-25 DIAGNOSIS — M1990 Unspecified osteoarthritis, unspecified site: Secondary | ICD-10-CM | POA: Diagnosis not present

## 2023-09-25 DIAGNOSIS — R7303 Prediabetes: Secondary | ICD-10-CM | POA: Diagnosis not present

## 2023-09-25 DIAGNOSIS — S81812D Laceration without foreign body, left lower leg, subsequent encounter: Secondary | ICD-10-CM | POA: Diagnosis not present

## 2023-09-25 DIAGNOSIS — I2510 Atherosclerotic heart disease of native coronary artery without angina pectoris: Secondary | ICD-10-CM | POA: Diagnosis not present

## 2023-09-25 DIAGNOSIS — G309 Alzheimer's disease, unspecified: Secondary | ICD-10-CM | POA: Diagnosis not present

## 2023-09-25 DIAGNOSIS — Z9981 Dependence on supplemental oxygen: Secondary | ICD-10-CM | POA: Diagnosis not present

## 2023-09-25 DIAGNOSIS — I5042 Chronic combined systolic (congestive) and diastolic (congestive) heart failure: Secondary | ICD-10-CM | POA: Diagnosis not present

## 2023-09-25 DIAGNOSIS — E782 Mixed hyperlipidemia: Secondary | ICD-10-CM | POA: Diagnosis not present

## 2023-09-25 DIAGNOSIS — Z7951 Long term (current) use of inhaled steroids: Secondary | ICD-10-CM | POA: Diagnosis not present

## 2023-09-25 DIAGNOSIS — F0283 Dementia in other diseases classified elsewhere, unspecified severity, with mood disturbance: Secondary | ICD-10-CM | POA: Diagnosis not present

## 2023-09-25 DIAGNOSIS — I110 Hypertensive heart disease with heart failure: Secondary | ICD-10-CM | POA: Diagnosis not present

## 2023-09-25 DIAGNOSIS — G894 Chronic pain syndrome: Secondary | ICD-10-CM | POA: Diagnosis not present

## 2023-09-25 DIAGNOSIS — J441 Chronic obstructive pulmonary disease with (acute) exacerbation: Secondary | ICD-10-CM | POA: Diagnosis not present

## 2023-09-25 DIAGNOSIS — F32A Depression, unspecified: Secondary | ICD-10-CM | POA: Diagnosis not present

## 2023-09-25 DIAGNOSIS — Z6841 Body Mass Index (BMI) 40.0 and over, adult: Secondary | ICD-10-CM | POA: Diagnosis not present

## 2023-09-25 DIAGNOSIS — M109 Gout, unspecified: Secondary | ICD-10-CM | POA: Diagnosis not present

## 2023-09-25 DIAGNOSIS — K219 Gastro-esophageal reflux disease without esophagitis: Secondary | ICD-10-CM | POA: Diagnosis not present

## 2023-09-25 DIAGNOSIS — M069 Rheumatoid arthritis, unspecified: Secondary | ICD-10-CM | POA: Diagnosis not present

## 2023-09-27 DIAGNOSIS — I4891 Unspecified atrial fibrillation: Secondary | ICD-10-CM | POA: Diagnosis not present

## 2023-09-27 DIAGNOSIS — E782 Mixed hyperlipidemia: Secondary | ICD-10-CM | POA: Diagnosis not present

## 2023-09-27 DIAGNOSIS — I2510 Atherosclerotic heart disease of native coronary artery without angina pectoris: Secondary | ICD-10-CM | POA: Diagnosis not present

## 2023-09-27 DIAGNOSIS — G629 Polyneuropathy, unspecified: Secondary | ICD-10-CM | POA: Diagnosis not present

## 2023-09-27 DIAGNOSIS — K219 Gastro-esophageal reflux disease without esophagitis: Secondary | ICD-10-CM | POA: Diagnosis not present

## 2023-09-27 DIAGNOSIS — M1990 Unspecified osteoarthritis, unspecified site: Secondary | ICD-10-CM | POA: Diagnosis not present

## 2023-09-27 DIAGNOSIS — G309 Alzheimer's disease, unspecified: Secondary | ICD-10-CM | POA: Diagnosis not present

## 2023-09-27 DIAGNOSIS — F32A Depression, unspecified: Secondary | ICD-10-CM | POA: Diagnosis not present

## 2023-09-27 DIAGNOSIS — I110 Hypertensive heart disease with heart failure: Secondary | ICD-10-CM | POA: Diagnosis not present

## 2023-09-27 DIAGNOSIS — E559 Vitamin D deficiency, unspecified: Secondary | ICD-10-CM | POA: Diagnosis not present

## 2023-09-27 DIAGNOSIS — J449 Chronic obstructive pulmonary disease, unspecified: Secondary | ICD-10-CM | POA: Diagnosis not present

## 2023-09-27 DIAGNOSIS — G894 Chronic pain syndrome: Secondary | ICD-10-CM | POA: Diagnosis not present

## 2023-09-27 DIAGNOSIS — R7303 Prediabetes: Secondary | ICD-10-CM | POA: Diagnosis not present

## 2023-09-27 DIAGNOSIS — E66813 Obesity, class 3: Secondary | ICD-10-CM | POA: Diagnosis not present

## 2023-09-27 DIAGNOSIS — Z9981 Dependence on supplemental oxygen: Secondary | ICD-10-CM | POA: Diagnosis not present

## 2023-09-27 DIAGNOSIS — Z6841 Body Mass Index (BMI) 40.0 and over, adult: Secondary | ICD-10-CM | POA: Diagnosis not present

## 2023-09-27 DIAGNOSIS — I5042 Chronic combined systolic (congestive) and diastolic (congestive) heart failure: Secondary | ICD-10-CM | POA: Diagnosis not present

## 2023-09-27 DIAGNOSIS — M109 Gout, unspecified: Secondary | ICD-10-CM | POA: Diagnosis not present

## 2023-09-27 DIAGNOSIS — S81812D Laceration without foreign body, left lower leg, subsequent encounter: Secondary | ICD-10-CM | POA: Diagnosis not present

## 2023-09-27 DIAGNOSIS — F0283 Dementia in other diseases classified elsewhere, unspecified severity, with mood disturbance: Secondary | ICD-10-CM | POA: Diagnosis not present

## 2023-09-27 DIAGNOSIS — M069 Rheumatoid arthritis, unspecified: Secondary | ICD-10-CM | POA: Diagnosis not present

## 2023-09-27 DIAGNOSIS — J441 Chronic obstructive pulmonary disease with (acute) exacerbation: Secondary | ICD-10-CM | POA: Diagnosis not present

## 2023-09-27 DIAGNOSIS — Z7951 Long term (current) use of inhaled steroids: Secondary | ICD-10-CM | POA: Diagnosis not present

## 2023-10-01 ENCOUNTER — Telehealth: Payer: Self-pay

## 2023-10-01 DIAGNOSIS — F32A Depression, unspecified: Secondary | ICD-10-CM | POA: Diagnosis not present

## 2023-10-01 DIAGNOSIS — M109 Gout, unspecified: Secondary | ICD-10-CM | POA: Diagnosis not present

## 2023-10-01 DIAGNOSIS — F0283 Dementia in other diseases classified elsewhere, unspecified severity, with mood disturbance: Secondary | ICD-10-CM | POA: Diagnosis not present

## 2023-10-01 DIAGNOSIS — K219 Gastro-esophageal reflux disease without esophagitis: Secondary | ICD-10-CM | POA: Diagnosis not present

## 2023-10-01 DIAGNOSIS — R7303 Prediabetes: Secondary | ICD-10-CM | POA: Diagnosis not present

## 2023-10-01 DIAGNOSIS — Z7951 Long term (current) use of inhaled steroids: Secondary | ICD-10-CM | POA: Diagnosis not present

## 2023-10-01 DIAGNOSIS — J441 Chronic obstructive pulmonary disease with (acute) exacerbation: Secondary | ICD-10-CM | POA: Diagnosis not present

## 2023-10-01 DIAGNOSIS — M1990 Unspecified osteoarthritis, unspecified site: Secondary | ICD-10-CM | POA: Diagnosis not present

## 2023-10-01 DIAGNOSIS — G309 Alzheimer's disease, unspecified: Secondary | ICD-10-CM | POA: Diagnosis not present

## 2023-10-01 DIAGNOSIS — G894 Chronic pain syndrome: Secondary | ICD-10-CM | POA: Diagnosis not present

## 2023-10-01 DIAGNOSIS — I4891 Unspecified atrial fibrillation: Secondary | ICD-10-CM | POA: Diagnosis not present

## 2023-10-01 DIAGNOSIS — G629 Polyneuropathy, unspecified: Secondary | ICD-10-CM | POA: Diagnosis not present

## 2023-10-01 DIAGNOSIS — I2510 Atherosclerotic heart disease of native coronary artery without angina pectoris: Secondary | ICD-10-CM | POA: Diagnosis not present

## 2023-10-01 DIAGNOSIS — M069 Rheumatoid arthritis, unspecified: Secondary | ICD-10-CM | POA: Diagnosis not present

## 2023-10-01 DIAGNOSIS — I110 Hypertensive heart disease with heart failure: Secondary | ICD-10-CM | POA: Diagnosis not present

## 2023-10-01 DIAGNOSIS — Z9981 Dependence on supplemental oxygen: Secondary | ICD-10-CM | POA: Diagnosis not present

## 2023-10-01 DIAGNOSIS — I5042 Chronic combined systolic (congestive) and diastolic (congestive) heart failure: Secondary | ICD-10-CM | POA: Diagnosis not present

## 2023-10-01 DIAGNOSIS — E559 Vitamin D deficiency, unspecified: Secondary | ICD-10-CM | POA: Diagnosis not present

## 2023-10-01 DIAGNOSIS — E782 Mixed hyperlipidemia: Secondary | ICD-10-CM | POA: Diagnosis not present

## 2023-10-01 DIAGNOSIS — I890 Lymphedema, not elsewhere classified: Secondary | ICD-10-CM | POA: Diagnosis not present

## 2023-10-01 DIAGNOSIS — Z6841 Body Mass Index (BMI) 40.0 and over, adult: Secondary | ICD-10-CM | POA: Diagnosis not present

## 2023-10-01 DIAGNOSIS — E66813 Obesity, class 3: Secondary | ICD-10-CM | POA: Diagnosis not present

## 2023-10-01 DIAGNOSIS — S81812D Laceration without foreign body, left lower leg, subsequent encounter: Secondary | ICD-10-CM | POA: Diagnosis not present

## 2023-10-01 NOTE — Telephone Encounter (Signed)
Copied from CRM [id number]. Topic: General - Other >> Oct 01, 2023  4:43 PM Lorin Glass B wrote: Reason for CRM: Home health [facility], nurse Lowella Bandy called to give Dr. Sedalia Muta message regarding patient missing appt today with them and attempts being made to knock on his door but no compliance. Stated that they will try to see him again and that Dr Sedalia Muta can call her if she needs any info or wants to discuss. Nikki's direct number is [phone]

## 2023-10-02 ENCOUNTER — Telehealth: Payer: Self-pay | Admitting: Family Medicine

## 2023-10-02 DIAGNOSIS — R062 Wheezing: Secondary | ICD-10-CM | POA: Diagnosis not present

## 2023-10-02 DIAGNOSIS — I480 Paroxysmal atrial fibrillation: Secondary | ICD-10-CM | POA: Diagnosis not present

## 2023-10-02 DIAGNOSIS — J441 Chronic obstructive pulmonary disease with (acute) exacerbation: Secondary | ICD-10-CM | POA: Diagnosis not present

## 2023-10-02 DIAGNOSIS — G2581 Restless legs syndrome: Secondary | ICD-10-CM | POA: Diagnosis not present

## 2023-10-02 DIAGNOSIS — R269 Unspecified abnormalities of gait and mobility: Secondary | ICD-10-CM | POA: Diagnosis not present

## 2023-10-02 DIAGNOSIS — I5023 Acute on chronic systolic (congestive) heart failure: Secondary | ICD-10-CM | POA: Diagnosis not present

## 2023-10-02 DIAGNOSIS — J440 Chronic obstructive pulmonary disease with acute lower respiratory infection: Secondary | ICD-10-CM | POA: Diagnosis not present

## 2023-10-02 DIAGNOSIS — I5022 Chronic systolic (congestive) heart failure: Secondary | ICD-10-CM | POA: Diagnosis not present

## 2023-10-02 DIAGNOSIS — F40240 Claustrophobia: Secondary | ICD-10-CM | POA: Diagnosis not present

## 2023-10-02 DIAGNOSIS — N400 Enlarged prostate without lower urinary tract symptoms: Secondary | ICD-10-CM | POA: Diagnosis not present

## 2023-10-02 DIAGNOSIS — R0602 Shortness of breath: Secondary | ICD-10-CM | POA: Diagnosis not present

## 2023-10-02 DIAGNOSIS — Z993 Dependence on wheelchair: Secondary | ICD-10-CM | POA: Diagnosis not present

## 2023-10-02 DIAGNOSIS — J9621 Acute and chronic respiratory failure with hypoxia: Secondary | ICD-10-CM | POA: Diagnosis not present

## 2023-10-02 DIAGNOSIS — Z6841 Body Mass Index (BMI) 40.0 and over, adult: Secondary | ICD-10-CM | POA: Diagnosis not present

## 2023-10-02 DIAGNOSIS — L97228 Non-pressure chronic ulcer of left calf with other specified severity: Secondary | ICD-10-CM | POA: Diagnosis not present

## 2023-10-02 DIAGNOSIS — Z7901 Long term (current) use of anticoagulants: Secondary | ICD-10-CM | POA: Diagnosis not present

## 2023-10-02 DIAGNOSIS — I110 Hypertensive heart disease with heart failure: Secondary | ICD-10-CM | POA: Diagnosis not present

## 2023-10-02 DIAGNOSIS — J918 Pleural effusion in other conditions classified elsewhere: Secondary | ICD-10-CM | POA: Diagnosis not present

## 2023-10-02 DIAGNOSIS — J189 Pneumonia, unspecified organism: Secondary | ICD-10-CM | POA: Diagnosis not present

## 2023-10-02 DIAGNOSIS — Z743 Need for continuous supervision: Secondary | ICD-10-CM | POA: Diagnosis not present

## 2023-10-02 DIAGNOSIS — Z79899 Other long term (current) drug therapy: Secondary | ICD-10-CM | POA: Diagnosis not present

## 2023-10-02 DIAGNOSIS — I4891 Unspecified atrial fibrillation: Secondary | ICD-10-CM | POA: Diagnosis not present

## 2023-10-02 DIAGNOSIS — J449 Chronic obstructive pulmonary disease, unspecified: Secondary | ICD-10-CM | POA: Diagnosis not present

## 2023-10-02 DIAGNOSIS — G4700 Insomnia, unspecified: Secondary | ICD-10-CM | POA: Diagnosis not present

## 2023-10-02 DIAGNOSIS — F17210 Nicotine dependence, cigarettes, uncomplicated: Secondary | ICD-10-CM | POA: Diagnosis not present

## 2023-10-02 DIAGNOSIS — M109 Gout, unspecified: Secondary | ICD-10-CM | POA: Diagnosis not present

## 2023-10-02 DIAGNOSIS — S81812D Laceration without foreign body, left lower leg, subsequent encounter: Secondary | ICD-10-CM | POA: Diagnosis not present

## 2023-10-02 DIAGNOSIS — I4819 Other persistent atrial fibrillation: Secondary | ICD-10-CM | POA: Diagnosis not present

## 2023-10-02 DIAGNOSIS — R279 Unspecified lack of coordination: Secondary | ICD-10-CM | POA: Diagnosis not present

## 2023-10-02 DIAGNOSIS — I5021 Acute systolic (congestive) heart failure: Secondary | ICD-10-CM | POA: Diagnosis not present

## 2023-10-02 DIAGNOSIS — J90 Pleural effusion, not elsewhere classified: Secondary | ICD-10-CM | POA: Diagnosis not present

## 2023-10-02 DIAGNOSIS — I429 Cardiomyopathy, unspecified: Secondary | ICD-10-CM | POA: Diagnosis not present

## 2023-10-02 DIAGNOSIS — I5020 Unspecified systolic (congestive) heart failure: Secondary | ICD-10-CM | POA: Diagnosis not present

## 2023-10-02 DIAGNOSIS — Z9181 History of falling: Secondary | ICD-10-CM | POA: Diagnosis not present

## 2023-10-02 DIAGNOSIS — I428 Other cardiomyopathies: Secondary | ICD-10-CM | POA: Diagnosis not present

## 2023-10-02 DIAGNOSIS — R079 Chest pain, unspecified: Secondary | ICD-10-CM | POA: Diagnosis not present

## 2023-10-02 DIAGNOSIS — R918 Other nonspecific abnormal finding of lung field: Secondary | ICD-10-CM | POA: Diagnosis not present

## 2023-10-02 DIAGNOSIS — J9600 Acute respiratory failure, unspecified whether with hypoxia or hypercapnia: Secondary | ICD-10-CM | POA: Diagnosis not present

## 2023-10-02 DIAGNOSIS — M6281 Muscle weakness (generalized): Secondary | ICD-10-CM | POA: Diagnosis not present

## 2023-10-02 DIAGNOSIS — E874 Mixed disorder of acid-base balance: Secondary | ICD-10-CM | POA: Diagnosis not present

## 2023-10-02 DIAGNOSIS — R293 Abnormal posture: Secondary | ICD-10-CM | POA: Diagnosis not present

## 2023-10-02 DIAGNOSIS — Z7401 Bed confinement status: Secondary | ICD-10-CM | POA: Diagnosis not present

## 2023-10-02 DIAGNOSIS — E8770 Fluid overload, unspecified: Secondary | ICD-10-CM | POA: Diagnosis not present

## 2023-10-02 DIAGNOSIS — I517 Cardiomegaly: Secondary | ICD-10-CM | POA: Diagnosis not present

## 2023-10-02 DIAGNOSIS — Z9981 Dependence on supplemental oxygen: Secondary | ICD-10-CM | POA: Diagnosis not present

## 2023-10-02 DIAGNOSIS — J9622 Acute and chronic respiratory failure with hypercapnia: Secondary | ICD-10-CM | POA: Diagnosis not present
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
## 2023-10-02 NOTE — Telephone Encounter (Signed)
[facility]  order #[id number]

## 2023-10-02 NOTE — Telephone Encounter (Signed)
I called [facility]. See my telephone note. Dr. Sedalia Muta

## 2023-10-03 DIAGNOSIS — M069 Rheumatoid arthritis, unspecified: Secondary | ICD-10-CM

## 2023-10-03 DIAGNOSIS — F32A Depression, unspecified: Secondary | ICD-10-CM

## 2023-10-03 DIAGNOSIS — Z789 Other specified health status: Secondary | ICD-10-CM | POA: Insufficient documentation

## 2023-10-03 DIAGNOSIS — F0283 Dementia in other diseases classified elsewhere, unspecified severity, with mood disturbance: Secondary | ICD-10-CM

## 2023-10-03 DIAGNOSIS — I5042 Chronic combined systolic (congestive) and diastolic (congestive) heart failure: Secondary | ICD-10-CM

## 2023-10-03 DIAGNOSIS — S81812D Laceration without foreign body, left lower leg, subsequent encounter: Secondary | ICD-10-CM | POA: Diagnosis not present

## 2023-10-03 DIAGNOSIS — J441 Chronic obstructive pulmonary disease with (acute) exacerbation: Secondary | ICD-10-CM | POA: Diagnosis not present

## 2023-10-03 DIAGNOSIS — M1990 Unspecified osteoarthritis, unspecified site: Secondary | ICD-10-CM

## 2023-10-03 DIAGNOSIS — I110 Hypertensive heart disease with heart failure: Secondary | ICD-10-CM | POA: Diagnosis not present

## 2023-10-03 DIAGNOSIS — G894 Chronic pain syndrome: Secondary | ICD-10-CM

## 2023-10-03 DIAGNOSIS — I4891 Unspecified atrial fibrillation: Secondary | ICD-10-CM | POA: Diagnosis not present

## 2023-10-03 DIAGNOSIS — M109 Gout, unspecified: Secondary | ICD-10-CM

## 2023-10-03 DIAGNOSIS — G309 Alzheimer's disease, unspecified: Secondary | ICD-10-CM

## 2023-10-06 DIAGNOSIS — R9430 Abnormal result of cardiovascular function study, unspecified: Secondary | ICD-10-CM | POA: Insufficient documentation

## 2023-10-07 ENCOUNTER — Telehealth: Payer: Self-pay | Admitting: Family Medicine

## 2023-10-07 ENCOUNTER — Other Ambulatory Visit: Payer: Self-pay | Admitting: Family Medicine

## 2023-10-07 DIAGNOSIS — R531 Weakness: Secondary | ICD-10-CM | POA: Diagnosis not present

## 2023-10-07 DIAGNOSIS — G2581 Restless legs syndrome: Secondary | ICD-10-CM | POA: Diagnosis not present

## 2023-10-07 DIAGNOSIS — M6281 Muscle weakness (generalized): Secondary | ICD-10-CM | POA: Diagnosis not present

## 2023-10-07 DIAGNOSIS — R293 Abnormal posture: Secondary | ICD-10-CM | POA: Diagnosis not present

## 2023-10-07 DIAGNOSIS — K219 Gastro-esophageal reflux disease without esophagitis: Secondary | ICD-10-CM | POA: Diagnosis not present

## 2023-10-07 DIAGNOSIS — R55 Syncope and collapse: Secondary | ICD-10-CM | POA: Diagnosis not present

## 2023-10-07 DIAGNOSIS — I429 Cardiomyopathy, unspecified: Secondary | ICD-10-CM | POA: Diagnosis not present

## 2023-10-07 DIAGNOSIS — I110 Hypertensive heart disease with heart failure: Secondary | ICD-10-CM | POA: Diagnosis not present

## 2023-10-07 DIAGNOSIS — J9622 Acute and chronic respiratory failure with hypercapnia: Secondary | ICD-10-CM | POA: Diagnosis not present

## 2023-10-07 DIAGNOSIS — J9600 Acute respiratory failure, unspecified whether with hypoxia or hypercapnia: Secondary | ICD-10-CM | POA: Diagnosis not present

## 2023-10-07 DIAGNOSIS — I9589 Other hypotension: Secondary | ICD-10-CM | POA: Diagnosis not present

## 2023-10-07 DIAGNOSIS — Z7901 Long term (current) use of anticoagulants: Secondary | ICD-10-CM | POA: Diagnosis not present

## 2023-10-07 DIAGNOSIS — G4700 Insomnia, unspecified: Secondary | ICD-10-CM | POA: Diagnosis not present

## 2023-10-07 DIAGNOSIS — E8770 Fluid overload, unspecified: Secondary | ICD-10-CM | POA: Diagnosis not present

## 2023-10-07 DIAGNOSIS — T447X5A Adverse effect of beta-adrenoreceptor antagonists, initial encounter: Secondary | ICD-10-CM | POA: Diagnosis not present

## 2023-10-07 DIAGNOSIS — I5021 Acute systolic (congestive) heart failure: Secondary | ICD-10-CM | POA: Diagnosis not present

## 2023-10-07 DIAGNOSIS — T465X5A Adverse effect of other antihypertensive drugs, initial encounter: Secondary | ICD-10-CM | POA: Diagnosis not present

## 2023-10-07 DIAGNOSIS — N179 Acute kidney failure, unspecified: Secondary | ICD-10-CM | POA: Diagnosis not present

## 2023-10-07 DIAGNOSIS — M109 Gout, unspecified: Secondary | ICD-10-CM | POA: Diagnosis not present

## 2023-10-07 DIAGNOSIS — F0390 Unspecified dementia without behavioral disturbance: Secondary | ICD-10-CM | POA: Diagnosis not present

## 2023-10-07 DIAGNOSIS — F32A Depression, unspecified: Secondary | ICD-10-CM | POA: Diagnosis not present

## 2023-10-07 DIAGNOSIS — R918 Other nonspecific abnormal finding of lung field: Secondary | ICD-10-CM | POA: Diagnosis not present

## 2023-10-07 DIAGNOSIS — R000 Tachycardia, unspecified: Secondary | ICD-10-CM | POA: Diagnosis not present

## 2023-10-07 DIAGNOSIS — I10 Essential (primary) hypertension: Secondary | ICD-10-CM | POA: Diagnosis not present

## 2023-10-07 DIAGNOSIS — R269 Unspecified abnormalities of gait and mobility: Secondary | ICD-10-CM | POA: Diagnosis not present

## 2023-10-07 DIAGNOSIS — R062 Wheezing: Secondary | ICD-10-CM | POA: Diagnosis not present

## 2023-10-07 DIAGNOSIS — R0902 Hypoxemia: Secondary | ICD-10-CM | POA: Diagnosis not present

## 2023-10-07 DIAGNOSIS — Z79899 Other long term (current) drug therapy: Secondary | ICD-10-CM | POA: Diagnosis not present

## 2023-10-07 DIAGNOSIS — Z743 Need for continuous supervision: Secondary | ICD-10-CM | POA: Diagnosis not present

## 2023-10-07 DIAGNOSIS — Z9981 Dependence on supplemental oxygen: Secondary | ICD-10-CM | POA: Diagnosis not present

## 2023-10-07 DIAGNOSIS — Z66 Do not resuscitate: Secondary | ICD-10-CM | POA: Diagnosis not present

## 2023-10-07 DIAGNOSIS — Z6841 Body Mass Index (BMI) 40.0 and over, adult: Secondary | ICD-10-CM | POA: Diagnosis not present

## 2023-10-07 DIAGNOSIS — J441 Chronic obstructive pulmonary disease with (acute) exacerbation: Secondary | ICD-10-CM | POA: Diagnosis not present

## 2023-10-07 DIAGNOSIS — J449 Chronic obstructive pulmonary disease, unspecified: Secondary | ICD-10-CM | POA: Diagnosis not present

## 2023-10-07 DIAGNOSIS — J9621 Acute and chronic respiratory failure with hypoxia: Secondary | ICD-10-CM | POA: Diagnosis not present

## 2023-10-07 DIAGNOSIS — J90 Pleural effusion, not elsewhere classified: Secondary | ICD-10-CM | POA: Diagnosis not present

## 2023-10-07 DIAGNOSIS — I4891 Unspecified atrial fibrillation: Secondary | ICD-10-CM | POA: Diagnosis not present

## 2023-10-07 DIAGNOSIS — E1140 Type 2 diabetes mellitus with diabetic neuropathy, unspecified: Secondary | ICD-10-CM | POA: Diagnosis not present

## 2023-10-07 DIAGNOSIS — I5022 Chronic systolic (congestive) heart failure: Secondary | ICD-10-CM | POA: Diagnosis not present

## 2023-10-07 DIAGNOSIS — R279 Unspecified lack of coordination: Secondary | ICD-10-CM | POA: Diagnosis not present

## 2023-10-07 DIAGNOSIS — N400 Enlarged prostate without lower urinary tract symptoms: Secondary | ICD-10-CM | POA: Diagnosis not present

## 2023-10-07 DIAGNOSIS — N289 Disorder of kidney and ureter, unspecified: Secondary | ICD-10-CM | POA: Diagnosis not present

## 2023-10-07 DIAGNOSIS — G8929 Other chronic pain: Secondary | ICD-10-CM | POA: Diagnosis not present

## 2023-10-07 DIAGNOSIS — E861 Hypovolemia: Secondary | ICD-10-CM | POA: Diagnosis not present

## 2023-10-07 DIAGNOSIS — R4182 Altered mental status, unspecified: Secondary | ICD-10-CM | POA: Diagnosis not present

## 2023-10-07 NOTE — Telephone Encounter (Signed)
[facility] - order #[id number]

## 2023-10-09 DIAGNOSIS — G2581 Restless legs syndrome: Secondary | ICD-10-CM | POA: Diagnosis not present

## 2023-10-09 DIAGNOSIS — J9621 Acute and chronic respiratory failure with hypoxia: Secondary | ICD-10-CM | POA: Diagnosis not present

## 2023-10-09 DIAGNOSIS — I10 Essential (primary) hypertension: Secondary | ICD-10-CM | POA: Diagnosis not present

## 2023-10-09 DIAGNOSIS — Z9981 Dependence on supplemental oxygen: Secondary | ICD-10-CM | POA: Diagnosis not present

## 2023-10-10 DIAGNOSIS — R262 Difficulty in walking, not elsewhere classified: Secondary | ICD-10-CM | POA: Diagnosis not present

## 2023-10-10 DIAGNOSIS — E8770 Fluid overload, unspecified: Secondary | ICD-10-CM | POA: Diagnosis not present

## 2023-10-10 DIAGNOSIS — R531 Weakness: Secondary | ICD-10-CM | POA: Diagnosis not present

## 2023-10-10 DIAGNOSIS — E861 Hypovolemia: Secondary | ICD-10-CM | POA: Diagnosis not present

## 2023-10-10 DIAGNOSIS — J449 Chronic obstructive pulmonary disease, unspecified: Secondary | ICD-10-CM | POA: Diagnosis not present

## 2023-10-10 DIAGNOSIS — F0390 Unspecified dementia without behavioral disturbance: Secondary | ICD-10-CM | POA: Diagnosis not present

## 2023-10-10 DIAGNOSIS — F32A Depression, unspecified: Secondary | ICD-10-CM | POA: Diagnosis not present

## 2023-10-10 DIAGNOSIS — T447X5A Adverse effect of beta-adrenoreceptor antagonists, initial encounter: Secondary | ICD-10-CM | POA: Diagnosis not present

## 2023-10-10 DIAGNOSIS — I110 Hypertensive heart disease with heart failure: Secondary | ICD-10-CM | POA: Diagnosis not present

## 2023-10-10 DIAGNOSIS — R269 Unspecified abnormalities of gait and mobility: Secondary | ICD-10-CM | POA: Diagnosis not present

## 2023-10-10 DIAGNOSIS — Z6841 Body Mass Index (BMI) 40.0 and over, adult: Secondary | ICD-10-CM | POA: Diagnosis not present

## 2023-10-10 DIAGNOSIS — R000 Tachycardia, unspecified: Secondary | ICD-10-CM | POA: Diagnosis not present

## 2023-10-10 DIAGNOSIS — Z66 Do not resuscitate: Secondary | ICD-10-CM | POA: Diagnosis not present

## 2023-10-10 DIAGNOSIS — J90 Pleural effusion, not elsewhere classified: Secondary | ICD-10-CM | POA: Diagnosis not present

## 2023-10-10 DIAGNOSIS — Z7901 Long term (current) use of anticoagulants: Secondary | ICD-10-CM | POA: Diagnosis not present

## 2023-10-10 DIAGNOSIS — E1140 Type 2 diabetes mellitus with diabetic neuropathy, unspecified: Secondary | ICD-10-CM | POA: Diagnosis not present

## 2023-10-10 DIAGNOSIS — R0902 Hypoxemia: Secondary | ICD-10-CM | POA: Diagnosis not present

## 2023-10-10 DIAGNOSIS — G2581 Restless legs syndrome: Secondary | ICD-10-CM | POA: Diagnosis not present

## 2023-10-10 DIAGNOSIS — J9621 Acute and chronic respiratory failure with hypoxia: Secondary | ICD-10-CM | POA: Diagnosis not present

## 2023-10-10 DIAGNOSIS — I10 Essential (primary) hypertension: Secondary | ICD-10-CM | POA: Diagnosis not present

## 2023-10-10 DIAGNOSIS — I4891 Unspecified atrial fibrillation: Secondary | ICD-10-CM | POA: Diagnosis not present

## 2023-10-10 DIAGNOSIS — N289 Disorder of kidney and ureter, unspecified: Secondary | ICD-10-CM | POA: Diagnosis not present

## 2023-10-10 DIAGNOSIS — R55 Syncope and collapse: Secondary | ICD-10-CM | POA: Diagnosis not present

## 2023-10-10 DIAGNOSIS — G4700 Insomnia, unspecified: Secondary | ICD-10-CM | POA: Diagnosis not present

## 2023-10-10 DIAGNOSIS — Z7401 Bed confinement status: Secondary | ICD-10-CM | POA: Diagnosis not present

## 2023-10-10 DIAGNOSIS — I429 Cardiomyopathy, unspecified: Secondary | ICD-10-CM | POA: Diagnosis not present

## 2023-10-10 DIAGNOSIS — G8929 Other chronic pain: Secondary | ICD-10-CM | POA: Diagnosis not present

## 2023-10-10 DIAGNOSIS — T465X5A Adverse effect of other antihypertensive drugs, initial encounter: Secondary | ICD-10-CM | POA: Diagnosis not present

## 2023-10-10 DIAGNOSIS — J9622 Acute and chronic respiratory failure with hypercapnia: Secondary | ICD-10-CM | POA: Diagnosis not present

## 2023-10-10 DIAGNOSIS — I5022 Chronic systolic (congestive) heart failure: Secondary | ICD-10-CM | POA: Diagnosis not present

## 2023-10-10 DIAGNOSIS — M109 Gout, unspecified: Secondary | ICD-10-CM | POA: Diagnosis not present

## 2023-10-10 DIAGNOSIS — R279 Unspecified lack of coordination: Secondary | ICD-10-CM | POA: Diagnosis not present

## 2023-10-10 DIAGNOSIS — Z79899 Other long term (current) drug therapy: Secondary | ICD-10-CM | POA: Diagnosis not present

## 2023-10-10 DIAGNOSIS — N179 Acute kidney failure, unspecified: Secondary | ICD-10-CM | POA: Diagnosis not present

## 2023-10-10 DIAGNOSIS — K219 Gastro-esophageal reflux disease without esophagitis: Secondary | ICD-10-CM | POA: Diagnosis not present

## 2023-10-10 DIAGNOSIS — R293 Abnormal posture: Secondary | ICD-10-CM | POA: Diagnosis not present

## 2023-10-10 DIAGNOSIS — N400 Enlarged prostate without lower urinary tract symptoms: Secondary | ICD-10-CM | POA: Diagnosis not present

## 2023-10-10 DIAGNOSIS — I9589 Other hypotension: Secondary | ICD-10-CM | POA: Diagnosis not present

## 2023-10-10 DIAGNOSIS — R4182 Altered mental status, unspecified: Secondary | ICD-10-CM | POA: Diagnosis not present

## 2023-10-10 DIAGNOSIS — R062 Wheezing: Secondary | ICD-10-CM | POA: Diagnosis not present

## 2023-10-10 DIAGNOSIS — R918 Other nonspecific abnormal finding of lung field: Secondary | ICD-10-CM | POA: Diagnosis not present

## 2023-10-10 DIAGNOSIS — M6281 Muscle weakness (generalized): Secondary | ICD-10-CM | POA: Diagnosis not present

## 2023-10-15 DIAGNOSIS — I4891 Unspecified atrial fibrillation: Secondary | ICD-10-CM | POA: Diagnosis not present

## 2023-10-15 DIAGNOSIS — R279 Unspecified lack of coordination: Secondary | ICD-10-CM | POA: Diagnosis not present

## 2023-10-15 DIAGNOSIS — J90 Pleural effusion, not elsewhere classified: Secondary | ICD-10-CM | POA: Diagnosis not present

## 2023-10-15 DIAGNOSIS — R262 Difficulty in walking, not elsewhere classified: Secondary | ICD-10-CM | POA: Diagnosis not present

## 2023-10-15 DIAGNOSIS — M6281 Muscle weakness (generalized): Secondary | ICD-10-CM | POA: Diagnosis not present

## 2023-10-15 DIAGNOSIS — I429 Cardiomyopathy, unspecified: Secondary | ICD-10-CM | POA: Diagnosis not present

## 2023-10-15 DIAGNOSIS — J449 Chronic obstructive pulmonary disease, unspecified: Secondary | ICD-10-CM | POA: Diagnosis not present

## 2023-10-15 DIAGNOSIS — I890 Lymphedema, not elsewhere classified: Secondary | ICD-10-CM | POA: Diagnosis not present

## 2023-10-15 DIAGNOSIS — J9622 Acute and chronic respiratory failure with hypercapnia: Secondary | ICD-10-CM | POA: Diagnosis not present

## 2023-10-15 DIAGNOSIS — J9621 Acute and chronic respiratory failure with hypoxia: Secondary | ICD-10-CM | POA: Diagnosis not present

## 2023-10-15 DIAGNOSIS — N179 Acute kidney failure, unspecified: Secondary | ICD-10-CM | POA: Diagnosis not present

## 2023-10-15 DIAGNOSIS — G4700 Insomnia, unspecified: Secondary | ICD-10-CM | POA: Diagnosis not present

## 2023-10-15 DIAGNOSIS — I5022 Chronic systolic (congestive) heart failure: Secondary | ICD-10-CM | POA: Diagnosis not present

## 2023-10-15 DIAGNOSIS — E8770 Fluid overload, unspecified: Secondary | ICD-10-CM | POA: Diagnosis not present

## 2023-10-15 DIAGNOSIS — Z6841 Body Mass Index (BMI) 40.0 and over, adult: Secondary | ICD-10-CM | POA: Diagnosis not present

## 2023-10-15 DIAGNOSIS — Z7401 Bed confinement status: Secondary | ICD-10-CM | POA: Diagnosis not present

## 2023-10-15 DIAGNOSIS — M109 Gout, unspecified: Secondary | ICD-10-CM | POA: Diagnosis not present

## 2023-10-15 DIAGNOSIS — R293 Abnormal posture: Secondary | ICD-10-CM | POA: Diagnosis not present

## 2023-10-15 DIAGNOSIS — R269 Unspecified abnormalities of gait and mobility: Secondary | ICD-10-CM | POA: Diagnosis not present

## 2023-10-15 DIAGNOSIS — R55 Syncope and collapse: Secondary | ICD-10-CM | POA: Diagnosis not present

## 2023-10-15 DIAGNOSIS — N400 Enlarged prostate without lower urinary tract symptoms: Secondary | ICD-10-CM | POA: Diagnosis not present

## 2023-10-15 DIAGNOSIS — G2581 Restless legs syndrome: Secondary | ICD-10-CM | POA: Diagnosis not present

## 2023-10-22 ENCOUNTER — Ambulatory Visit: Payer: Medicare Other | Admitting: Family Medicine

## 2023-10-22 ENCOUNTER — Other Ambulatory Visit: Payer: Self-pay | Admitting: Family Medicine

## 2023-10-22 DIAGNOSIS — I5042 Chronic combined systolic (congestive) and diastolic (congestive) heart failure: Secondary | ICD-10-CM

## 2023-10-23 ENCOUNTER — Other Ambulatory Visit (HOSPITAL_COMMUNITY): Payer: Self-pay

## 2023-10-27 DIAGNOSIS — J449 Chronic obstructive pulmonary disease, unspecified: Secondary | ICD-10-CM | POA: Diagnosis not present

## 2023-10-31 ENCOUNTER — Telehealth: Payer: Self-pay

## 2023-10-31 DIAGNOSIS — I890 Lymphedema, not elsewhere classified: Secondary | ICD-10-CM | POA: Diagnosis not present

## 2023-11-05 ENCOUNTER — Telehealth: Payer: Self-pay

## 2023-11-05 NOTE — Transitions of Care (Post Inpatient/ED Visit) (Signed)
11/05/2023  Name: Shawn Osborn MRN: [id number] DOB: 1951-08-07  Today's TOC FU Call Status: Today's TOC FU Call Status:: Successful TOC FU Call Completed TOC FU Call Complete Date: 11/05/23 Patient's Name and Date of Birth confirmed.  Transition Care Management Follow-up Telephone Call Date of Discharge: 11/04/23 Discharge Facility: Other [facility]) Name of Other (Non[facility]) Discharge Facility: [facility] Type of Discharge: Inpatient Admission How have you been since you were released from the hospital?: Better Any questions or concerns?: No  Items Reviewed: Did you receive and understand the discharge instructions provided?: Yes Medications obtained,verified, and reconciled?: Yes (Medications Reviewed) Any new allergies since your discharge?: No Dietary orders reviewed?: Yes Do you have support at home?: Yes People in Home: child(ren), adult  Medications Reviewed Today: Medications Reviewed Today     Reviewed by Karena Addison, LPN (Licensed Practical Nurse) on 11/05/23 at 1026  Med List Status: <None>   Medication Order Taking? Sig Documenting Provider Last Dose Status Informant  albuterol (PROVENTIL) (2.5 MG/3ML) 0.083% nebulizer solution [id number]  Take 3 mLs (2.5 mg total) by nebulization every 6 (six) hours as needed for wheezing or shortness of breath. Windell Moment, MD  Active   albuterol (VENTOLIN HFA) 108 (90 Base) MCG/ACT inhaler [id number]  Inhale 2 puffs into the lungs every 6 (six) hours as needed for wheezing or shortness of breath. Windell Moment, MD  Active   allopurinol (ZYLOPRIM) 300 MG tablet [id number] No TAKE ONE TABLET BY MOUTH EVERY DAY at 1pm  Patient taking differently: Take 150 mg by mouth daily. TAKE ONE TABLET BY MOUTH EVERY DAY at 1pm   Blane Ohara, MD Taking Active   apixaban (ELIQUIS) 5 MG TABS tablet [id number] No Take 1 tablet (5 mg total) by mouth 2 (two) times daily. Cox, Kirsten, MD Taking Active   donepezil (ARICEPT) 10 MG  tablet [id number] No TAKE ONE TABLET BY MOUTH EVERY EVENING Cox, Kirsten, MD Taking Active   doxycycline (VIBRA-TABS) 100 MG tablet [id number]  Take 1 tablet (100 mg total) by mouth 2 (two) times daily. Cox, Kirsten, MD  Active   Fluticasone-Umeclidin-Vilant (TRELEGY ELLIPTA) 100-62.5-25 MCG/ACT AEPB [id number] No Inhale 1 puff into the lungs daily. Blane Ohara, MD Taking Active   Fluticasone-Umeclidin-Vilant [facility] ELLIPTA) 200-62.5-25 MCG/ACT AEPB [id number] No Inhale 1 Inhalation into the lungs daily. Cox, Kirsten, MD Taking Active   furosemide (LASIX) 40 MG tablet [id number]  TAKE ONE TABLET BY MOUTH TWICE DAILY Cox, Kirsten, MD  Active   hydrocortisone 2.5%-Eucerin equivalent 1:1 cream mixture [id number] No Apply topically 2 (two) times daily.  Patient taking differently: Apply 1 Application topically 2 (two) times daily.   Cox, Kirsten, MD Taking Active   hydrOXYzine (ATARAX) 50 MG tablet [id number] No TAKE ONE TABLET BY MOUTH AT BEDTIME Cox, Kirsten, MD Taking Active   ipratropium-albuterol (DUONEB) 0.5-2.5 (3) MG/3ML SOLN [id number] No Take 3 mLs by nebulization every 4 (four) hours as needed.  Patient taking differently: Take 3 mLs by nebulization every 4 (four) hours as needed (SOB).   Cox, Kirsten, MD Taking Active   memantine (NAMENDA XR) 28 MG CP24 24 hr capsule [id number] No TAKE ONE CAPSULE BY MOUTH AT BEDTIME Cox, Kirsten, MD Taking Active   omega-3 acid ethyl esters (LOVAZA) 1 g capsule [id number]  TAKE 2 CAPSULES BY MOUTH TWICE DAILY Cox, Kirsten, MD  Active   omeprazole (PRILOSEC) 40 MG capsule [id number] No TAKE ONE CAPSULE BY MOUTH EVERY DAY Cox, Kirsten, MD Taking Active   potassium chloride SA (KLOR-CON M) 20  MEQ tablet [id number]  Take 1 tablet (20 mEq total) by mouth daily. Cox, Kirsten, MD  Active   pregabalin (LYRICA) 75 MG capsule [id number]  TAKE ONE CAPSULE BY MOUTH EVERY DAY and TAKE ONE CAPSULE BY MOUTH EVERY DAY at 1pm and TAKE ONE CAPSULE BY MOUTH AT BEDTIME Cox, Kirsten, MD   Active   propranolol ER (INDERAL LA) 60 MG 24 hr capsule [id number] No Take 1 capsule (60 mg total) by mouth daily. Cox, Kirsten, MD Taking Active   QUEtiapine (SEROQUEL) 50 MG tablet [id number]  TAKE ONE TABLET BY MOUTH AT BEDTIME Cox, Kirsten, MD  Active   ramelteon (ROZEREM) 8 MG tablet [id number]  TAKE ONE TABLET BY MOUTH AT BEDTIME Cox, Kirsten, MD  Active   rOPINIRole (REQUIP) 0.5 MG tablet [id number]  TAKE ONE TABLET BY MOUTH AT BEDTIME Cox, Kirsten, MD  Active   rosuvastatin (CRESTOR) 5 MG tablet [id number] No TAKE ONE TABLET BY MOUTH AT BEDTIME Cox, Kirsten, MD Taking Active   sertraline (ZOLOFT) 100 MG tablet [id number]  TAKE ONE TABLET BY MOUTH AT BEDTIME Windell Moment, MD  Active   tamsulosin (FLOMAX) 0.4 MG CAPS capsule [id number]  TAKE 2 CAPSULES BY MOUTH EVERY EVENING Cox, Kirsten, MD  Active   triamcinolone cream (KENALOG) 0.1 % [id number] No Apply 1 application. topically 2 (two) times daily as needed (to ears for itching). Cox, Kirsten, MD Taking Active   Vitamin D, Ergocalciferol, (DRISDOL) 1.25 MG (50000 UNIT) CAPS capsule [id number] No Take 1 capsule (50,000 Units total) by mouth once a week. Cox, Kirsten, MD Taking Active             Home Care and Equipment/Supplies: Were Home Health Services Ordered?: Yes Name of Home Health Agency:: unknown Has Agency set up a time to come to your home?: No Any new equipment or medical supplies ordered?: Yes Name of Medical supply agency?: unknown Were you able to get the equipment/medical supplies?: No Do you have any questions related to the use of the equipment/supplies?: No  Functional Questionnaire: Do you need assistance with bathing/showering or dressing?: Yes Do you need assistance with meal preparation?: Yes Do you need assistance with eating?: No Do you have difficulty maintaining continence: No Do you need assistance with getting out of bed/getting out of a chair/moving?: Yes Do you have difficulty managing or taking your  medications?: No  Follow up appointments reviewed: PCP Follow-up appointment confirmed?: Yes Date of PCP follow-up appointment?: 11/19/23 Follow-up Provider: [facility] Follow-up appointment confirmed?: NA Do you need transportation to your follow-up appointment?: No Do you understand care options if your condition(s) worsen?: Yes-patient verbalized understanding    SIGNATURE Karena Addison, LPN [facility] Nurse Health Advisor Direct Dial [phone]

## 2023-11-07 DIAGNOSIS — I5022 Chronic systolic (congestive) heart failure: Secondary | ICD-10-CM | POA: Diagnosis not present

## 2023-11-07 DIAGNOSIS — K219 Gastro-esophageal reflux disease without esophagitis: Secondary | ICD-10-CM | POA: Diagnosis not present

## 2023-11-07 DIAGNOSIS — F32A Depression, unspecified: Secondary | ICD-10-CM | POA: Diagnosis not present

## 2023-11-07 DIAGNOSIS — F03A3 Unspecified dementia, mild, with mood disturbance: Secondary | ICD-10-CM | POA: Diagnosis not present

## 2023-11-07 DIAGNOSIS — N400 Enlarged prostate without lower urinary tract symptoms: Secondary | ICD-10-CM | POA: Diagnosis not present

## 2023-11-07 DIAGNOSIS — Z993 Dependence on wheelchair: Secondary | ICD-10-CM | POA: Diagnosis not present

## 2023-11-07 DIAGNOSIS — I429 Cardiomyopathy, unspecified: Secondary | ICD-10-CM | POA: Diagnosis not present

## 2023-11-07 DIAGNOSIS — Z720 Tobacco use: Secondary | ICD-10-CM | POA: Diagnosis not present

## 2023-11-07 DIAGNOSIS — I4891 Unspecified atrial fibrillation: Secondary | ICD-10-CM | POA: Diagnosis not present

## 2023-11-07 DIAGNOSIS — J9621 Acute and chronic respiratory failure with hypoxia: Secondary | ICD-10-CM | POA: Diagnosis not present

## 2023-11-07 DIAGNOSIS — F03A18 Unspecified dementia, mild, with other behavioral disturbance: Secondary | ICD-10-CM | POA: Diagnosis not present

## 2023-11-07 DIAGNOSIS — Z556 Problems related to health literacy: Secondary | ICD-10-CM | POA: Diagnosis not present

## 2023-11-07 DIAGNOSIS — G2581 Restless legs syndrome: Secondary | ICD-10-CM | POA: Diagnosis not present

## 2023-11-07 DIAGNOSIS — G4700 Insomnia, unspecified: Secondary | ICD-10-CM | POA: Diagnosis not present

## 2023-11-07 DIAGNOSIS — I5042 Chronic combined systolic (congestive) and diastolic (congestive) heart failure: Secondary | ICD-10-CM | POA: Diagnosis not present

## 2023-11-07 DIAGNOSIS — N179 Acute kidney failure, unspecified: Secondary | ICD-10-CM | POA: Diagnosis not present

## 2023-11-07 DIAGNOSIS — Z9981 Dependence on supplemental oxygen: Secondary | ICD-10-CM | POA: Diagnosis not present

## 2023-11-07 DIAGNOSIS — J449 Chronic obstructive pulmonary disease, unspecified: Secondary | ICD-10-CM | POA: Diagnosis not present

## 2023-11-07 DIAGNOSIS — S91102D Unspecified open wound of left great toe without damage to nail, subsequent encounter: Secondary | ICD-10-CM | POA: Diagnosis not present

## 2023-11-07 DIAGNOSIS — Z7901 Long term (current) use of anticoagulants: Secondary | ICD-10-CM | POA: Diagnosis not present

## 2023-11-07 DIAGNOSIS — S81802D Unspecified open wound, left lower leg, subsequent encounter: Secondary | ICD-10-CM | POA: Diagnosis not present

## 2023-11-07 DIAGNOSIS — J9622 Acute and chronic respiratory failure with hypercapnia: Secondary | ICD-10-CM | POA: Diagnosis not present

## 2023-11-07 DIAGNOSIS — I110 Hypertensive heart disease with heart failure: Secondary | ICD-10-CM | POA: Diagnosis not present

## 2023-11-08 ENCOUNTER — Other Ambulatory Visit: Payer: Self-pay | Admitting: Family Medicine

## 2023-11-08 ENCOUNTER — Telehealth: Payer: Self-pay

## 2023-11-08 DIAGNOSIS — M1A09X0 Idiopathic chronic gout, multiple sites, without tophus (tophi): Secondary | ICD-10-CM

## 2023-11-08 NOTE — Telephone Encounter (Signed)
Copied from CRM [id number]. Topic: Clinical - Home Health Verbal Orders >> Nov 07, 2023  4:12 PM Shelah Lewandowsky wrote: Caller/Agency: Jeral[facility] Callback Number: [phone] Service Requested: Physical Therapy, Skilled Nursing, Home Health Aide and equipment needs Frequency: Physical Therapy 2 times a week for 4 weeks and then 1 time per week for 4 weeks Any new concerns about the patient? Yes  New wounds on leg and needs med management help

## 2023-11-08 NOTE — Telephone Encounter (Signed)
Verbal given. Also stated she faxed order for home health aide and nurse for wound care

## 2023-11-09 DIAGNOSIS — N179 Acute kidney failure, unspecified: Secondary | ICD-10-CM | POA: Diagnosis not present

## 2023-11-09 DIAGNOSIS — I429 Cardiomyopathy, unspecified: Secondary | ICD-10-CM | POA: Diagnosis not present

## 2023-11-09 DIAGNOSIS — S91102D Unspecified open wound of left great toe without damage to nail, subsequent encounter: Secondary | ICD-10-CM | POA: Diagnosis not present

## 2023-11-09 DIAGNOSIS — N400 Enlarged prostate without lower urinary tract symptoms: Secondary | ICD-10-CM | POA: Diagnosis not present

## 2023-11-09 DIAGNOSIS — K219 Gastro-esophageal reflux disease without esophagitis: Secondary | ICD-10-CM | POA: Diagnosis not present

## 2023-11-09 DIAGNOSIS — F03A18 Unspecified dementia, mild, with other behavioral disturbance: Secondary | ICD-10-CM | POA: Diagnosis not present

## 2023-11-09 DIAGNOSIS — I5022 Chronic systolic (congestive) heart failure: Secondary | ICD-10-CM | POA: Diagnosis not present

## 2023-11-09 DIAGNOSIS — I4891 Unspecified atrial fibrillation: Secondary | ICD-10-CM | POA: Diagnosis not present

## 2023-11-09 DIAGNOSIS — J9621 Acute and chronic respiratory failure with hypoxia: Secondary | ICD-10-CM | POA: Diagnosis not present

## 2023-11-09 DIAGNOSIS — F03A3 Unspecified dementia, mild, with mood disturbance: Secondary | ICD-10-CM | POA: Diagnosis not present

## 2023-11-09 DIAGNOSIS — I110 Hypertensive heart disease with heart failure: Secondary | ICD-10-CM | POA: Diagnosis not present

## 2023-11-09 DIAGNOSIS — S81802D Unspecified open wound, left lower leg, subsequent encounter: Secondary | ICD-10-CM | POA: Diagnosis not present

## 2023-11-09 DIAGNOSIS — F32A Depression, unspecified: Secondary | ICD-10-CM | POA: Diagnosis not present

## 2023-11-09 DIAGNOSIS — J9622 Acute and chronic respiratory failure with hypercapnia: Secondary | ICD-10-CM | POA: Diagnosis not present

## 2023-11-09 DIAGNOSIS — J449 Chronic obstructive pulmonary disease, unspecified: Secondary | ICD-10-CM | POA: Diagnosis not present

## 2023-11-09 DIAGNOSIS — G4700 Insomnia, unspecified: Secondary | ICD-10-CM | POA: Diagnosis not present

## 2023-11-11 ENCOUNTER — Telehealth: Payer: Self-pay | Admitting: Family Medicine

## 2023-11-11 DIAGNOSIS — I4891 Unspecified atrial fibrillation: Secondary | ICD-10-CM | POA: Diagnosis not present

## 2023-11-11 DIAGNOSIS — I429 Cardiomyopathy, unspecified: Secondary | ICD-10-CM | POA: Diagnosis not present

## 2023-11-11 DIAGNOSIS — K219 Gastro-esophageal reflux disease without esophagitis: Secondary | ICD-10-CM | POA: Diagnosis not present

## 2023-11-11 DIAGNOSIS — S91102D Unspecified open wound of left great toe without damage to nail, subsequent encounter: Secondary | ICD-10-CM | POA: Diagnosis not present

## 2023-11-11 DIAGNOSIS — J449 Chronic obstructive pulmonary disease, unspecified: Secondary | ICD-10-CM | POA: Diagnosis not present

## 2023-11-11 DIAGNOSIS — N400 Enlarged prostate without lower urinary tract symptoms: Secondary | ICD-10-CM | POA: Diagnosis not present

## 2023-11-11 DIAGNOSIS — F03A18 Unspecified dementia, mild, with other behavioral disturbance: Secondary | ICD-10-CM | POA: Diagnosis not present

## 2023-11-11 DIAGNOSIS — J9621 Acute and chronic respiratory failure with hypoxia: Secondary | ICD-10-CM | POA: Diagnosis not present

## 2023-11-11 DIAGNOSIS — F03A3 Unspecified dementia, mild, with mood disturbance: Secondary | ICD-10-CM | POA: Diagnosis not present

## 2023-11-11 DIAGNOSIS — N179 Acute kidney failure, unspecified: Secondary | ICD-10-CM | POA: Diagnosis not present

## 2023-11-11 DIAGNOSIS — I5022 Chronic systolic (congestive) heart failure: Secondary | ICD-10-CM | POA: Diagnosis not present

## 2023-11-11 DIAGNOSIS — F32A Depression, unspecified: Secondary | ICD-10-CM | POA: Diagnosis not present

## 2023-11-11 DIAGNOSIS — S81802D Unspecified open wound, left lower leg, subsequent encounter: Secondary | ICD-10-CM | POA: Diagnosis not present

## 2023-11-11 DIAGNOSIS — I110 Hypertensive heart disease with heart failure: Secondary | ICD-10-CM | POA: Diagnosis not present

## 2023-11-11 DIAGNOSIS — G4700 Insomnia, unspecified: Secondary | ICD-10-CM | POA: Diagnosis not present

## 2023-11-11 DIAGNOSIS — J9622 Acute and chronic respiratory failure with hypercapnia: Secondary | ICD-10-CM | POA: Diagnosis not present

## 2023-11-11 NOTE — Telephone Encounter (Signed)
[facility]  11/10/23

## 2023-11-11 NOTE — Telephone Encounter (Signed)
[facility] 11/08/23

## 2023-11-14 DIAGNOSIS — G4700 Insomnia, unspecified: Secondary | ICD-10-CM | POA: Diagnosis not present

## 2023-11-14 DIAGNOSIS — I5022 Chronic systolic (congestive) heart failure: Secondary | ICD-10-CM | POA: Diagnosis not present

## 2023-11-14 DIAGNOSIS — J9621 Acute and chronic respiratory failure with hypoxia: Secondary | ICD-10-CM | POA: Diagnosis not present

## 2023-11-14 DIAGNOSIS — J449 Chronic obstructive pulmonary disease, unspecified: Secondary | ICD-10-CM | POA: Diagnosis not present

## 2023-11-14 DIAGNOSIS — F32A Depression, unspecified: Secondary | ICD-10-CM | POA: Diagnosis not present

## 2023-11-14 DIAGNOSIS — F03A3 Unspecified dementia, mild, with mood disturbance: Secondary | ICD-10-CM | POA: Diagnosis not present

## 2023-11-14 DIAGNOSIS — F03A18 Unspecified dementia, mild, with other behavioral disturbance: Secondary | ICD-10-CM | POA: Diagnosis not present

## 2023-11-14 DIAGNOSIS — N400 Enlarged prostate without lower urinary tract symptoms: Secondary | ICD-10-CM | POA: Diagnosis not present

## 2023-11-14 DIAGNOSIS — N179 Acute kidney failure, unspecified: Secondary | ICD-10-CM | POA: Diagnosis not present

## 2023-11-14 DIAGNOSIS — I110 Hypertensive heart disease with heart failure: Secondary | ICD-10-CM | POA: Diagnosis not present

## 2023-11-14 DIAGNOSIS — J9622 Acute and chronic respiratory failure with hypercapnia: Secondary | ICD-10-CM | POA: Diagnosis not present

## 2023-11-14 DIAGNOSIS — S91102D Unspecified open wound of left great toe without damage to nail, subsequent encounter: Secondary | ICD-10-CM | POA: Diagnosis not present

## 2023-11-14 DIAGNOSIS — K219 Gastro-esophageal reflux disease without esophagitis: Secondary | ICD-10-CM | POA: Diagnosis not present

## 2023-11-14 DIAGNOSIS — I429 Cardiomyopathy, unspecified: Secondary | ICD-10-CM | POA: Diagnosis not present

## 2023-11-14 DIAGNOSIS — I4891 Unspecified atrial fibrillation: Secondary | ICD-10-CM | POA: Diagnosis not present

## 2023-11-14 DIAGNOSIS — S81802D Unspecified open wound, left lower leg, subsequent encounter: Secondary | ICD-10-CM | POA: Diagnosis not present

## 2023-11-15 DIAGNOSIS — F32A Depression, unspecified: Secondary | ICD-10-CM | POA: Diagnosis not present

## 2023-11-15 DIAGNOSIS — N400 Enlarged prostate without lower urinary tract symptoms: Secondary | ICD-10-CM | POA: Diagnosis not present

## 2023-11-15 DIAGNOSIS — F03A3 Unspecified dementia, mild, with mood disturbance: Secondary | ICD-10-CM | POA: Diagnosis not present

## 2023-11-15 DIAGNOSIS — F03A18 Unspecified dementia, mild, with other behavioral disturbance: Secondary | ICD-10-CM | POA: Diagnosis not present

## 2023-11-15 DIAGNOSIS — I110 Hypertensive heart disease with heart failure: Secondary | ICD-10-CM | POA: Diagnosis not present

## 2023-11-15 DIAGNOSIS — Z6841 Body Mass Index (BMI) 40.0 and over, adult: Secondary | ICD-10-CM | POA: Diagnosis not present

## 2023-11-15 DIAGNOSIS — K219 Gastro-esophageal reflux disease without esophagitis: Secondary | ICD-10-CM | POA: Diagnosis not present

## 2023-11-15 DIAGNOSIS — G4700 Insomnia, unspecified: Secondary | ICD-10-CM | POA: Diagnosis not present

## 2023-11-15 DIAGNOSIS — I5022 Chronic systolic (congestive) heart failure: Secondary | ICD-10-CM | POA: Diagnosis not present

## 2023-11-15 DIAGNOSIS — I429 Cardiomyopathy, unspecified: Secondary | ICD-10-CM | POA: Diagnosis not present

## 2023-11-15 DIAGNOSIS — S81802D Unspecified open wound, left lower leg, subsequent encounter: Secondary | ICD-10-CM | POA: Diagnosis not present

## 2023-11-15 DIAGNOSIS — J449 Chronic obstructive pulmonary disease, unspecified: Secondary | ICD-10-CM | POA: Diagnosis not present

## 2023-11-15 DIAGNOSIS — N179 Acute kidney failure, unspecified: Secondary | ICD-10-CM | POA: Diagnosis not present

## 2023-11-15 DIAGNOSIS — S91102D Unspecified open wound of left great toe without damage to nail, subsequent encounter: Secondary | ICD-10-CM | POA: Diagnosis not present

## 2023-11-15 DIAGNOSIS — J9622 Acute and chronic respiratory failure with hypercapnia: Secondary | ICD-10-CM | POA: Diagnosis not present

## 2023-11-15 DIAGNOSIS — I4891 Unspecified atrial fibrillation: Secondary | ICD-10-CM | POA: Diagnosis not present

## 2023-11-15 DIAGNOSIS — J9621 Acute and chronic respiratory failure with hypoxia: Secondary | ICD-10-CM | POA: Diagnosis not present

## 2023-11-18 ENCOUNTER — Telehealth: Payer: Self-pay

## 2023-11-18 DIAGNOSIS — I110 Hypertensive heart disease with heart failure: Secondary | ICD-10-CM | POA: Diagnosis not present

## 2023-11-18 DIAGNOSIS — S81802D Unspecified open wound, left lower leg, subsequent encounter: Secondary | ICD-10-CM | POA: Diagnosis not present

## 2023-11-18 DIAGNOSIS — F32A Depression, unspecified: Secondary | ICD-10-CM | POA: Diagnosis not present

## 2023-11-18 DIAGNOSIS — K219 Gastro-esophageal reflux disease without esophagitis: Secondary | ICD-10-CM | POA: Diagnosis not present

## 2023-11-18 DIAGNOSIS — J9622 Acute and chronic respiratory failure with hypercapnia: Secondary | ICD-10-CM | POA: Diagnosis not present

## 2023-11-18 DIAGNOSIS — N400 Enlarged prostate without lower urinary tract symptoms: Secondary | ICD-10-CM | POA: Diagnosis not present

## 2023-11-18 DIAGNOSIS — N179 Acute kidney failure, unspecified: Secondary | ICD-10-CM | POA: Diagnosis not present

## 2023-11-18 DIAGNOSIS — J9621 Acute and chronic respiratory failure with hypoxia: Secondary | ICD-10-CM | POA: Diagnosis not present

## 2023-11-18 DIAGNOSIS — G4700 Insomnia, unspecified: Secondary | ICD-10-CM | POA: Diagnosis not present

## 2023-11-18 DIAGNOSIS — I429 Cardiomyopathy, unspecified: Secondary | ICD-10-CM | POA: Diagnosis not present

## 2023-11-18 DIAGNOSIS — S91102D Unspecified open wound of left great toe without damage to nail, subsequent encounter: Secondary | ICD-10-CM | POA: Diagnosis not present

## 2023-11-18 DIAGNOSIS — F03A18 Unspecified dementia, mild, with other behavioral disturbance: Secondary | ICD-10-CM | POA: Diagnosis not present

## 2023-11-18 DIAGNOSIS — J449 Chronic obstructive pulmonary disease, unspecified: Secondary | ICD-10-CM | POA: Diagnosis not present

## 2023-11-18 DIAGNOSIS — I4891 Unspecified atrial fibrillation: Secondary | ICD-10-CM | POA: Diagnosis not present

## 2023-11-18 DIAGNOSIS — I5022 Chronic systolic (congestive) heart failure: Secondary | ICD-10-CM | POA: Diagnosis not present

## 2023-11-18 DIAGNOSIS — F03A3 Unspecified dementia, mild, with mood disturbance: Secondary | ICD-10-CM | POA: Diagnosis not present

## 2023-11-18 NOTE — Progress Notes (Signed)
 Subjective:  Patient ID: Shawn Osborn, male    DOB: 1951/03/06  Age: 72 y.o. MRN: [id number]  Chief Complaint  Patient presents with   Medical Management of Chronic Issues    HPI Presents for hospital follow-up.  He was admitted from November 21 through November 26 to [facility].  He was initially admitted from November 13 through November 18 for COPD and CHF exacerbation and hypotension.  When he came back on November 21st he was found to have a right pleural effusion and acute kidney injury with a creatinine of 1.91.  BUN was 50.  Patient was treated with IV fluids.  Lisinopril and spironolactone were stopped.  During the planning for discharge he was found to be in atrial fibrillation with RVR.  Patient was started on Toprol 75 XL mg nightly.  Which he was discharged on.  He was discharged to a skilled nursing facility [facility]) for rehab.  Final diagnoses: acute kidney injury, hypotension, and syncope. Patient also suffers from heart failure with reduced ejection fraction of 20 to 30%.  He was discharged on his home Lasix .  Remained off lisinopril and spironolactone.  Atrial fibrillation with RVR: restarted Eliquis . In discharge summary it states his dose of Toprol-XL was increased to 75 mg nightly. I do not see this in his med list and have requested the discharge summary from [facility]. Previously has been on propranolol  for tremor.   Dementia continue Namenda  and Aricept .  Neuropathy continue Lyrica .  BPH continued home Flomax .  In addition he had wounds of his lower legs.  He had a distal right pretibial wound.  Pressure injury to his buttocks on the left.  Has a posterior proximal right thigh wound.  Patient was discharged from skilled nursing facility on December 16.  He is doing well with his home health care.  He presents for transition of care today.   COPD: Patient is on trelegy once daily, but he is out. On albuterol  HFA 2 puffs four times a day as needed or  nebulizer. Trelegy 1 puff daily.  Chronic Pain syndrome: Lyrica  50 mg three times a day, and allopurinol  300 mg daily. Patient has chronic back pain, gout, knee pain.     Alzheimer's Dementia: aricept  10 mg before bed and Namenda  xr 28 mg once daily.   Depression: zoloft  100 mg once daily. ON seroquel  50 mg before bed, rozerem  8 mg before bed, and hydroxyzine  50 mg before bed.    Hypertensive heart disease with CORONARY ARTERY DISEASE/CONGESTIVE HEART FAILURE: On propranolol  ER 60 mg daily (also for tremor.), crestor  5 mg before bed, Lovaza  2 g BID   Vitamin D  deficiency: on Vitamin D  50K weekly.    GERD: well controlled on omeprazole  40 mg daily.    BPH: on tamsulosin  0.4 mg before bed.    Neuropathy: on lyrica  75 mg three times a day.   States his children have taken his car keys (is not upset about this). Enjoying home health nurses coming in to assist him, says Jori is SOCIAL RESEARCH OFFICER, GOVERNMENT.  Dizzy when laying down and turning over.     09/09/2023    1:50 PM 06/19/2023    2:06 PM 03/27/2023   11:25 AM 10/30/2022    4:14 PM 10/17/2022   10:53 AM  Depression screen PHQ 2/9  Decreased Interest 0 0 0 0 0  Down, Depressed, Hopeless 0 0 0 0 0  PHQ - 2 Score 0 0 0 0 0  Altered sleeping   0  Tired, decreased energy   2    Change in appetite   0    Feeling bad or failure about yourself    0    Trouble concentrating   0    Moving slowly or fidgety/restless   0    Suicidal thoughts   0    PHQ-9 Score   2    Difficult doing work/chores   Not difficult at all          09/09/2023    1:50 PM  Fall Risk   Falls in the past year? 1  Injury with Fall? 1  Risk for fall due to : Impaired mobility;Impaired balance/gait  Follow up Falls evaluation completed;Falls prevention discussed    Patient Care Team: Meckenzie Balsley, Abigail, MD as PCP - General (Family Medicine) Tobb, Kardie, DO as PCP - Cardiology (Cardiology)   Review of Systems  Constitutional:  Negative for chills, diaphoresis, fatigue and  fever.  HENT:  Negative for congestion, ear pain and sore throat.   Respiratory:  Negative for cough and shortness of breath.   Cardiovascular:  Negative for chest pain and leg swelling.  Gastrointestinal:  Negative for abdominal pain, constipation, diarrhea, nausea and vomiting.  Genitourinary:  Negative for dysuria and urgency.  Musculoskeletal:  Negative for arthralgias and myalgias.  Neurological:  Negative for dizziness and headaches.  Psychiatric/Behavioral:  Negative for dysphoric mood.     Current Outpatient Medications on File Prior to Visit  Medication Sig Dispense Refill   albuterol  (PROVENTIL ) (2.5 MG/3ML) 0.083% nebulizer solution Take 3 mLs (2.5 mg total) by nebulization every 6 (six) hours as needed for wheezing or shortness of breath. 360 mL 12   albuterol  (VENTOLIN  HFA) 108 (90 Base) MCG/ACT inhaler Inhale 2 puffs into the lungs every 6 (six) hours as needed for wheezing or shortness of breath. 6.7 g 12   allopurinol  (ZYLOPRIM ) 300 MG tablet TAKE ONE TABLET BY MOUTH EVERY DAY at 1pm 30 tablet 3   apixaban  (ELIQUIS ) 5 MG TABS tablet Take 1 tablet (5 mg total) by mouth 2 (two) times daily. 60 tablet 0   donepezil  (ARICEPT ) 10 MG tablet TAKE ONE TABLET BY MOUTH EVERY EVENING 90 tablet 3   furosemide  (LASIX ) 40 MG tablet TAKE ONE TABLET BY MOUTH TWICE DAILY 60 tablet 2   hydrOXYzine  (ATARAX ) 50 MG tablet TAKE ONE TABLET BY MOUTH AT BEDTIME 30 tablet 2   ipratropium-albuterol  (DUONEB) 0.5-2.5 (3) MG/3ML SOLN Take 3 mLs by nebulization every 4 (four) hours as needed. (Patient taking differently: Take 3 mLs by nebulization every 4 (four) hours as needed (SOB).) 360 mL 2   memantine  (NAMENDA  XR) 28 MG CP24 24 hr capsule TAKE ONE CAPSULE BY MOUTH AT BEDTIME 90 capsule 1   omega-3 acid ethyl esters (LOVAZA ) 1 g capsule TAKE 2 CAPSULES BY MOUTH TWICE DAILY 360 capsule 1   omeprazole  (PRILOSEC) 40 MG capsule TAKE ONE CAPSULE BY MOUTH EVERY DAY 30 capsule 1   potassium chloride  SA  (KLOR-CON  M) 20 MEQ tablet Take 1 tablet (20 mEq total) by mouth daily. 30 tablet 0   pregabalin  (LYRICA ) 75 MG capsule TAKE ONE CAPSULE BY MOUTH EVERY DAY and TAKE ONE CAPSULE BY MOUTH EVERY DAY at 1pm and TAKE ONE CAPSULE BY MOUTH AT BEDTIME 90 capsule 2   propranolol  ER (INDERAL  LA) 60 MG 24 hr capsule Take 1 capsule (60 mg total) by mouth daily. 30 capsule 5   QUEtiapine  (SEROQUEL ) 50 MG tablet TAKE ONE TABLET BY MOUTH AT  BEDTIME 30 tablet 11   ramelteon  (ROZEREM ) 8 MG tablet TAKE ONE TABLET BY MOUTH AT BEDTIME 30 tablet 11   rOPINIRole  (REQUIP ) 0.5 MG tablet TAKE ONE TABLET BY MOUTH AT BEDTIME 30 tablet 2   sertraline  (ZOLOFT ) 100 MG tablet TAKE ONE TABLET BY MOUTH AT BEDTIME 30 tablet 2   tamsulosin  (FLOMAX ) 0.4 MG CAPS capsule TAKE 2 CAPSULES BY MOUTH EVERY EVENING 60 capsule 2   Vitamin D , Ergocalciferol , (DRISDOL ) 1.25 MG (50000 UNIT) CAPS capsule TAKE ONE CAPSULE BY MOUTH EVERY WEEK 15 capsule 3   No current facility-administered medications on file prior to visit.   Past Medical History:  Diagnosis Date   Acute combined systolic and diastolic CHF, NYHA class 3 (HCC) 10/08/2019   Acute delirium 10/08/2019   Acute exacerbation of CHF (congestive heart failure) (HCC) 10/08/2019   Acute gout of multiple sites 07/01/2016   Acute respiratory failure with hypoxia and hypercapnia (HCC) 10/08/2019   Allergy    Anxiety    Arthritis    Gout, osteoarthritis in Back and Knees   Auditory hallucinations 05/01/2022   Cataract    CHF (congestive heart failure) (HCC)    Chronic bronchitis    Community acquired pneumonia 10/08/2019   COPD (chronic obstructive pulmonary disease) (HCC) 10/08/2019   Dementia (HCC) 10/08/2019   Elevated troponin 10/08/2019   Emphysema of lung (HCC)    Essential hypertension 07/03/2016   GERD (gastroesophageal reflux disease)    History of kidney stones    HLD (hyperlipidemia) 10/08/2019   Hypercapnic respiratory failure (HCC) 10/08/2019   Hyperlipidemia     Hypertension    Hypertensive heart disease 10/08/2019   Morbid obesity due to excess calories (HCC) 10/08/2019   Narcotic overdose (HCC) 10/08/2019   Neuropathy    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   NSTEMI (non-ST elevated myocardial infarction) (HCC) 10/08/2019   Obstructive sleep apnea    Occlusion and stenosis of carotid artery without mention of cerebral infarction 12/13/2011   OSA on CPAP 10/08/2019   Pain in joint of left shoulder 01/12/2019   Paranoia (psychosis) (HCC) 07/01/2016   Poorly-controlled hypertension 10/08/2019   Prediabetes    Psychotic disorder with delusions (HCC) 07/01/2016   Respiratory failure with hypoxia and hypercapnia (HCC) 10/08/2019   Restless leg syndrome 07/03/2016   Rheumatoid arthritis(714.0)    RLS (restless legs syndrome)    Sepsis (HCC) 10/08/2019   Sleep apnea    Spinal stenosis    Syncope 04/03/2019   Tear of left rotator cuff 01/12/2019   Toxic metabolic encephalopathy 10/08/2019   UTI (urinary tract infection) 10/08/2019   Vascular dementia with behavioral disturbance (HCC) 07/06/2016   Visual hallucinations 05/01/2022   Past Surgical History:  Procedure Laterality Date   AMPUTATION TOE Left 01/01/2023   APPENDECTOMY     CARPAL TUNNEL RELEASE     CORONARY PRESSURE/FFR STUDY N/A 10/20/2019   Procedure: INTRAVASCULAR PRESSURE WIRE/FFR STUDY;  Surgeon: Claudene Victory ORN, MD;  Location: [facility] INVASIVE CV LAB;  Service: Cardiovascular;  Laterality: N/A;   EYE SURGERY     LEFT HEART CATH AND CORONARY ANGIOGRAPHY N/A 10/20/2019   Procedure: LEFT HEART CATH AND CORONARY ANGIOGRAPHY;  Surgeon: Claudene Victory ORN, MD;  Location: [facility] INVASIVE CV LAB;  Service: Cardiovascular;  Laterality: N/A;   SPINE SURGERY  1998, 2003, 2012   Laminectomy X 3    Family History  Problem Relation Age of Onset   Heart disease Mother    Hypertension Mother    Social  History   Socioeconomic History   Marital status: Widowed    Spouse name: Not on file   Number  of children: 4   Years of education: Not on file   Highest education level: 7th grade  Occupational History   Not on file  Tobacco Use   Smoking status: Former    Current packs/day: 0.75    Average packs/day: 0.8 packs/day for 58.0 years (43.5 ttl pk-yrs)    Types: Cigarettes    Start date: 33   Smokeless tobacco: Current    Types: Chew  Vaping Use   Vaping status: Never Used  Substance and Sexual Activity   Alcohol use: No   Drug use: No   Sexual activity: Not Currently  Other Topics Concern   Not on file  Social History Narrative   Not on file   Social Drivers of Health   Financial Resource Strain: Low Risk  (06/19/2023)   Overall Financial Resource Strain (CARDIA)    Difficulty of Paying Living Expenses: Not very hard  Food Insecurity: No Food Insecurity (06/19/2023)   Hunger Vital Sign    Worried About Running Out of Food in the Last Year: Never true    Ran Out of Food in the Last Year: Never true  Transportation Needs: No Transportation Needs (06/19/2023)   PRAPARE - Administrator, Civil Service (Medical): No    Lack of Transportation (Non-Medical): No  Physical Activity: Inactive (06/19/2023)   Exercise Vital Sign    Days of Exercise per Week: 0 days    Minutes of Exercise per Session: 0 min  Stress: No Stress Concern Present (06/19/2023)   [employer] of Occupational Health - Occupational Stress Questionnaire    Feeling of Stress : Not at all  Social Connections: Moderately Isolated (11/19/2023)   Social Connection and Isolation Panel [NHANES]    Frequency of Communication with Friends and Family: Three times a week    Frequency of Social Gatherings with Friends and Family: Three times a week    Attends Religious Services: More than 4 times per year    Active Member of Clubs or Organizations: No    Attends Banker Meetings: Never    Marital Status: Widowed    Objective:  BP 128/64   Pulse 75   Temp 97.6 F (36.4 C)   Ht 5'  10 (1.778 m)   Wt 243 lb (110.2 kg) Comment: Patient repoted, unable to weight due to inability to stand.  SpO2 100%   BMI 34.87 kg/m      11/19/2023   10:08 AM 09/09/2023    1:44 PM 05/22/2023    2:39 PM  BP/Weight  Systolic BP 128 110 124  Diastolic BP 64 60 68  Wt. (Lbs) 243 325 230  BMI 34.87 kg/m2 46.63 kg/m2 32.08 kg/m2    Physical Exam Vitals reviewed.  Constitutional:      Appearance: Normal appearance. He is obese.  Neck:     Vascular: No carotid bruit.  Cardiovascular:     Rate and Rhythm: Normal rate. Rhythm irregular.     Heart sounds: No murmur heard. Pulmonary:     Effort: Pulmonary effort is normal.     Breath sounds: Wheezing present.  Abdominal:     General: Abdomen is flat. Bowel sounds are normal.     Palpations: Abdomen is soft.     Tenderness: There is no abdominal tenderness.  Musculoskeletal:     Comments: In a wheelchair.   Neurological:  Mental Status: He is alert and oriented to person, place, and time.  Psychiatric:        Mood and Affect: Mood normal.        Behavior: Behavior normal.     Diabetic Foot Exam - Simple   Simple Foot Form Diabetic Foot exam was performed with the following findings: Yes 12/19/2023  4:30 PM  Visual Inspection See comments: Yes Sensation Testing See comments: Yes Pulse Check Posterior Tibialis and Dorsalis pulse intact bilaterally: Yes Comments Poor sensation.  Thickened nails.  Toe amputation.  FEET ARE CLEAN! SHOES ARE BETTER FITTED AND CLEAN ALSO.       Lab Results  Component Value Date   WBC 11.9 (H) 11/19/2023   HGB 13.3 11/19/2023   HCT 42.8 11/19/2023   PLT 272 11/19/2023   GLUCOSE 91 11/19/2023   CHOL 184 11/19/2023   TRIG 192 (H) 11/19/2023   HDL 45 11/19/2023   LDLCALC 106 (H) 11/19/2023   ALT 13 11/19/2023   AST 15 11/19/2023   NA 147 (H) 11/19/2023   K 5.2 11/19/2023   CL 107 (H) 11/19/2023   CREATININE 0.72 (L) 11/19/2023   BUN 11 11/19/2023   CO2 24 11/19/2023   TSH  1.850 05/22/2023   INR 0.9 08/30/2020   HGBA1C 5.7 (H) 09/09/2023      Assessment & Plan:    Longstanding persistent atrial fibrillation (HCC) Assessment & Plan: Management per specialist.  Continue eliquis .    Hypertensive heart disease with chronic combined systolic and diastolic congestive heart failure [facility]) Assessment & Plan: Management per specialist. Dr. Sheena  I have requested records because I am confused about his medicines.    Mixed hyperlipidemia Assessment & Plan: Well controlled.  No changes to medicines. Rosuvastatin  5 mg daily, lovaza  2g BID. Continue to work on eating a healthy diet and exercise.  Orders: -     CBC with Differential/Platelet -     Comprehensive metabolic panel -     Lipid panel  Prediabetes Assessment & Plan: Recommend low carb diet ad regular exercise. Labs drawn   COPD exacerbation (HCC) -     Doxycycline  Hyclate; Take 1 tablet (100 mg total) by mouth 2 (two) times daily.  Dispense: 14 tablet; Refill: 0  Impaired mobility and ADLs Assessment & Plan: Continue home health care to work on mobility, gait, and balance issues.    Chronic respiratory failure with hypoxia (HCC) Assessment & Plan: Continue oxygen  2 L.    Moderate early onset Alzheimer's dementia with mood disturbance (HCC) Assessment & Plan: Continue namenda  and aricept .  Fairly stable. Seems little better today.       Meds ordered this encounter  Medications   doxycycline  (VIBRA -TABS) 100 MG tablet    Sig: Take 1 tablet (100 mg total) by mouth 2 (two) times daily.    Dispense:  14 tablet    Refill:  0    Orders Placed This Encounter  Procedures   CBC with Differential/Platelet   Comprehensive metabolic panel   Lipid panel     Follow-up: Return in about 14 weeks (around 02/25/2024) for chronic follow up.  Total time spent on today's visit was 45 minutes, including both face-to-face time and nonface-to-face time personally spent on review of chart  (labs and imaging), discussing labs and goals, discussing further work-up, treatment options, referrals to specialist if needed, reviewing outside records of pertinent, answering patient's questions, and coordinating care.   I,Katherina A Bramblett,acting as a scribe for Abigail Free, MD.,have  documented all relevant documentation on the behalf of Abigail Free, MD,as directed by  Abigail Free, MD while in the presence of Abigail Free, MD.   An After Visit Summary was printed and given to the patient.  I attest that I have reviewed this visit and agree with the plan scribed by my staff.   Abigail Free, MD [facility] [phone]

## 2023-11-18 NOTE — Telephone Encounter (Signed)
[facility]: CLIENT COORDINATION NOTE REPORT: NOTE DATE: 11/15/2023

## 2023-11-18 NOTE — Telephone Encounter (Signed)
I left a message wanting to confirm his appointment for tomorrow 11/19/2023. I stated that appointment time and arrival time on the VM. I asked for the patient to bring his insurance card and a current medication list. Also, I asked for the patient to call the office back to confirm his appointment

## 2023-11-18 NOTE — Telephone Encounter (Signed)
[facility]: ORDER#: [id number]

## 2023-11-19 ENCOUNTER — Encounter: Payer: Self-pay | Admitting: Family Medicine

## 2023-11-19 ENCOUNTER — Ambulatory Visit (INDEPENDENT_AMBULATORY_CARE_PROVIDER_SITE_OTHER): Payer: Medicare Other | Admitting: Family Medicine

## 2023-11-19 VITALS — BP 128/64 | HR 75 | Temp 97.6°F | Ht 70.0 in | Wt 243.0 lb

## 2023-11-19 DIAGNOSIS — R7303 Prediabetes: Secondary | ICD-10-CM

## 2023-11-19 DIAGNOSIS — I5042 Chronic combined systolic (congestive) and diastolic (congestive) heart failure: Secondary | ICD-10-CM

## 2023-11-19 DIAGNOSIS — Z789 Other specified health status: Secondary | ICD-10-CM

## 2023-11-19 DIAGNOSIS — I4891 Unspecified atrial fibrillation: Secondary | ICD-10-CM

## 2023-11-19 DIAGNOSIS — Z7409 Other reduced mobility: Secondary | ICD-10-CM

## 2023-11-19 DIAGNOSIS — I110 Hypertensive heart disease with heart failure: Secondary | ICD-10-CM | POA: Diagnosis not present

## 2023-11-19 DIAGNOSIS — J441 Chronic obstructive pulmonary disease with (acute) exacerbation: Secondary | ICD-10-CM

## 2023-11-19 DIAGNOSIS — G300 Alzheimer's disease with early onset: Secondary | ICD-10-CM

## 2023-11-19 DIAGNOSIS — I4811 Longstanding persistent atrial fibrillation: Secondary | ICD-10-CM | POA: Diagnosis not present

## 2023-11-19 DIAGNOSIS — E782 Mixed hyperlipidemia: Secondary | ICD-10-CM

## 2023-11-19 DIAGNOSIS — J9611 Chronic respiratory failure with hypoxia: Secondary | ICD-10-CM

## 2023-11-19 DIAGNOSIS — F02B3 Dementia in other diseases classified elsewhere, moderate, with mood disturbance: Secondary | ICD-10-CM

## 2023-11-19 MED ORDER — DOXYCYCLINE HYCLATE 100 MG PO TABS: 100.0000 mg | ORAL_TABLET | Freq: Two times a day (BID) | ORAL | 0 refills | Status: DC

## 2023-11-19 NOTE — Telephone Encounter (Signed)
 PAP: PAP application for [employer] rights [employer] ([employer])) has been mailed to pt's home address on file. Will fax provider portion of application to provider's office when pt's portion is received.

## 2023-11-20 DIAGNOSIS — N400 Enlarged prostate without lower urinary tract symptoms: Secondary | ICD-10-CM | POA: Diagnosis not present

## 2023-11-20 DIAGNOSIS — I5022 Chronic systolic (congestive) heart failure: Secondary | ICD-10-CM | POA: Diagnosis not present

## 2023-11-20 DIAGNOSIS — F03A3 Unspecified dementia, mild, with mood disturbance: Secondary | ICD-10-CM | POA: Diagnosis not present

## 2023-11-20 DIAGNOSIS — J9621 Acute and chronic respiratory failure with hypoxia: Secondary | ICD-10-CM | POA: Diagnosis not present

## 2023-11-20 DIAGNOSIS — N179 Acute kidney failure, unspecified: Secondary | ICD-10-CM | POA: Diagnosis not present

## 2023-11-20 DIAGNOSIS — I110 Hypertensive heart disease with heart failure: Secondary | ICD-10-CM | POA: Diagnosis not present

## 2023-11-20 DIAGNOSIS — J9622 Acute and chronic respiratory failure with hypercapnia: Secondary | ICD-10-CM | POA: Diagnosis not present

## 2023-11-20 DIAGNOSIS — I429 Cardiomyopathy, unspecified: Secondary | ICD-10-CM | POA: Diagnosis not present

## 2023-11-20 DIAGNOSIS — J449 Chronic obstructive pulmonary disease, unspecified: Secondary | ICD-10-CM | POA: Diagnosis not present

## 2023-11-20 DIAGNOSIS — F32A Depression, unspecified: Secondary | ICD-10-CM | POA: Diagnosis not present

## 2023-11-20 DIAGNOSIS — K219 Gastro-esophageal reflux disease without esophagitis: Secondary | ICD-10-CM | POA: Diagnosis not present

## 2023-11-20 DIAGNOSIS — I4891 Unspecified atrial fibrillation: Secondary | ICD-10-CM | POA: Diagnosis not present

## 2023-11-20 DIAGNOSIS — G4700 Insomnia, unspecified: Secondary | ICD-10-CM | POA: Diagnosis not present

## 2023-11-20 DIAGNOSIS — F03A18 Unspecified dementia, mild, with other behavioral disturbance: Secondary | ICD-10-CM | POA: Diagnosis not present

## 2023-11-20 DIAGNOSIS — S81802D Unspecified open wound, left lower leg, subsequent encounter: Secondary | ICD-10-CM | POA: Diagnosis not present

## 2023-11-20 DIAGNOSIS — S91102D Unspecified open wound of left great toe without damage to nail, subsequent encounter: Secondary | ICD-10-CM | POA: Diagnosis not present

## 2023-11-20 LAB — CBC WITH DIFFERENTIAL/PLATELET
Basophils Absolute: 0.1 10*3/uL (ref 0.0–0.2)
Basos: 1 %
EOS (ABSOLUTE): 0.1 10*3/uL (ref 0.0–0.4)
Eos: 1 %
Hematocrit: 42.8 % (ref 37.5–51.0)
Hemoglobin: 13.3 g/dL (ref 13.0–17.7)
Immature Grans (Abs): 0 10*3/uL (ref 0.0–0.1)
Immature Granulocytes: 0 %
Lymphocytes Absolute: 3.6 10*3/uL — ABNORMAL HIGH (ref 0.7–3.1)
Lymphs: 30 %
MCH: 26.5 pg — ABNORMAL LOW (ref 26.6–33.0)
MCHC: 31.1 g/dL — ABNORMAL LOW (ref 31.5–35.7)
MCV: 85 fL (ref 79–97)
Monocytes Absolute: 0.8 10*3/uL (ref 0.1–0.9)
Monocytes: 7 %
Neutrophils Absolute: 7.3 10*3/uL — ABNORMAL HIGH (ref 1.4–7.0)
Neutrophils: 61 %
Platelets: 272 10*3/uL (ref 150–450)
RBC: 5.01 x10E6/uL (ref 4.14–5.80)
RDW: 16.5 % — ABNORMAL HIGH (ref 11.6–15.4)
WBC: 11.9 10*3/uL — ABNORMAL HIGH (ref 3.4–10.8)

## 2023-11-20 LAB — COMPREHENSIVE METABOLIC PANEL
ALT: 13 [IU]/L (ref 0–44)
AST: 15 [IU]/L (ref 0–40)
Albumin: 4 g/dL (ref 3.8–4.8)
Alkaline Phosphatase: 94 [IU]/L (ref 44–121)
BUN/Creatinine Ratio: 15 (ref 10–24)
BUN: 11 mg/dL (ref 8–27)
Bilirubin Total: 0.2 mg/dL (ref 0.0–1.2)
CO2: 24 mmol/L (ref 20–29)
Calcium: 9.2 mg/dL (ref 8.6–10.2)
Chloride: 107 mmol/L — ABNORMAL HIGH (ref 96–106)
Creatinine, Ser: 0.72 mg/dL — ABNORMAL LOW (ref 0.76–1.27)
Globulin, Total: 2.5 g/dL (ref 1.5–4.5)
Glucose: 91 mg/dL (ref 70–99)
Potassium: 5.2 mmol/L (ref 3.5–5.2)
Sodium: 147 mmol/L — ABNORMAL HIGH (ref 134–144)
Total Protein: 6.5 g/dL (ref 6.0–8.5)
eGFR: 96 mL/min/{1.73_m2} (ref >59)

## 2023-11-20 LAB — LIPID PANEL
Chol/HDL Ratio: 4.1 {ratio} (ref 0.0–5.0)
Cholesterol, Total: 184 mg/dL (ref 100–199)
HDL: 45 mg/dL (ref >39)
LDL Chol Calc (NIH): 106 mg/dL — ABNORMAL HIGH (ref 0–99)
Triglycerides: 192 mg/dL — ABNORMAL HIGH (ref 0–149)
VLDL Cholesterol Cal: 33 mg/dL (ref 5–40)

## 2023-11-21 ENCOUNTER — Other Ambulatory Visit: Payer: Self-pay

## 2023-11-21 ENCOUNTER — Ambulatory Visit: Payer: Medicare Other | Admitting: Physician Assistant

## 2023-11-21 MED ORDER — ROSUVASTATIN CALCIUM 10 MG PO TABS
10.0000 mg | ORAL_TABLET | Freq: Every day | ORAL | 1 refills | Status: DC
Start: 2023-11-21 — End: 2024-05-03

## 2023-11-22 NOTE — Assessment & Plan Note (Signed)
 Recommend low carb diet ad regular exercise. Labs drawn

## 2023-11-22 NOTE — Assessment & Plan Note (Signed)
Well controlled.  No changes to medicines. Rosuvastatin 5 mg daily, lovaza 2g BID. Continue to work on eating a healthy diet and exercise.

## 2023-11-22 NOTE — Assessment & Plan Note (Addendum)
 Management per specialist. Dr. Servando Salina  I have requested records because I am confused about his medicines.

## 2023-11-22 NOTE — Assessment & Plan Note (Addendum)
 Management per specialist.  Continue eliquis.

## 2023-11-23 DIAGNOSIS — E0859 Diabetes mellitus due to underlying condition with other circulatory complications: Secondary | ICD-10-CM | POA: Insufficient documentation

## 2023-11-23 NOTE — Assessment & Plan Note (Signed)
 Continue namenda and aricept.  Fairly stable. Seems little better today.

## 2023-11-23 NOTE — Assessment & Plan Note (Signed)
 Continue home health care to work on mobility, gait, and balance issues.

## 2023-11-23 NOTE — Assessment & Plan Note (Signed)
Continue oxygen 2 L. 

## 2023-11-26 DIAGNOSIS — I5022 Chronic systolic (congestive) heart failure: Secondary | ICD-10-CM | POA: Diagnosis not present

## 2023-11-26 DIAGNOSIS — N179 Acute kidney failure, unspecified: Secondary | ICD-10-CM | POA: Diagnosis not present

## 2023-11-26 DIAGNOSIS — J9622 Acute and chronic respiratory failure with hypercapnia: Secondary | ICD-10-CM | POA: Diagnosis not present

## 2023-11-26 DIAGNOSIS — G4700 Insomnia, unspecified: Secondary | ICD-10-CM | POA: Diagnosis not present

## 2023-11-26 DIAGNOSIS — F03A18 Unspecified dementia, mild, with other behavioral disturbance: Secondary | ICD-10-CM | POA: Diagnosis not present

## 2023-11-26 DIAGNOSIS — F03A3 Unspecified dementia, mild, with mood disturbance: Secondary | ICD-10-CM | POA: Diagnosis not present

## 2023-11-26 DIAGNOSIS — N400 Enlarged prostate without lower urinary tract symptoms: Secondary | ICD-10-CM | POA: Diagnosis not present

## 2023-11-26 DIAGNOSIS — I110 Hypertensive heart disease with heart failure: Secondary | ICD-10-CM | POA: Diagnosis not present

## 2023-11-26 DIAGNOSIS — K219 Gastro-esophageal reflux disease without esophagitis: Secondary | ICD-10-CM | POA: Diagnosis not present

## 2023-11-26 DIAGNOSIS — F32A Depression, unspecified: Secondary | ICD-10-CM | POA: Diagnosis not present

## 2023-11-26 DIAGNOSIS — J449 Chronic obstructive pulmonary disease, unspecified: Secondary | ICD-10-CM | POA: Diagnosis not present

## 2023-11-26 DIAGNOSIS — J9621 Acute and chronic respiratory failure with hypoxia: Secondary | ICD-10-CM | POA: Diagnosis not present

## 2023-11-26 DIAGNOSIS — I429 Cardiomyopathy, unspecified: Secondary | ICD-10-CM | POA: Diagnosis not present

## 2023-11-26 DIAGNOSIS — I4891 Unspecified atrial fibrillation: Secondary | ICD-10-CM | POA: Diagnosis not present

## 2023-11-26 DIAGNOSIS — S91102D Unspecified open wound of left great toe without damage to nail, subsequent encounter: Secondary | ICD-10-CM | POA: Diagnosis not present

## 2023-11-26 DIAGNOSIS — S81802D Unspecified open wound, left lower leg, subsequent encounter: Secondary | ICD-10-CM | POA: Diagnosis not present

## 2023-11-27 DIAGNOSIS — J449 Chronic obstructive pulmonary disease, unspecified: Secondary | ICD-10-CM | POA: Diagnosis not present

## 2023-11-28 DIAGNOSIS — K219 Gastro-esophageal reflux disease without esophagitis: Secondary | ICD-10-CM | POA: Diagnosis not present

## 2023-11-28 DIAGNOSIS — N400 Enlarged prostate without lower urinary tract symptoms: Secondary | ICD-10-CM | POA: Diagnosis not present

## 2023-11-28 DIAGNOSIS — S91102D Unspecified open wound of left great toe without damage to nail, subsequent encounter: Secondary | ICD-10-CM | POA: Diagnosis not present

## 2023-11-28 DIAGNOSIS — F32A Depression, unspecified: Secondary | ICD-10-CM | POA: Diagnosis not present

## 2023-11-28 DIAGNOSIS — F03A18 Unspecified dementia, mild, with other behavioral disturbance: Secondary | ICD-10-CM | POA: Diagnosis not present

## 2023-11-28 DIAGNOSIS — J449 Chronic obstructive pulmonary disease, unspecified: Secondary | ICD-10-CM | POA: Diagnosis not present

## 2023-11-28 DIAGNOSIS — J9622 Acute and chronic respiratory failure with hypercapnia: Secondary | ICD-10-CM | POA: Diagnosis not present

## 2023-11-28 DIAGNOSIS — I4891 Unspecified atrial fibrillation: Secondary | ICD-10-CM | POA: Diagnosis not present

## 2023-11-28 DIAGNOSIS — I110 Hypertensive heart disease with heart failure: Secondary | ICD-10-CM | POA: Diagnosis not present

## 2023-11-28 DIAGNOSIS — I429 Cardiomyopathy, unspecified: Secondary | ICD-10-CM | POA: Diagnosis not present

## 2023-11-28 DIAGNOSIS — I5022 Chronic systolic (congestive) heart failure: Secondary | ICD-10-CM | POA: Diagnosis not present

## 2023-11-28 DIAGNOSIS — N179 Acute kidney failure, unspecified: Secondary | ICD-10-CM | POA: Diagnosis not present

## 2023-11-28 DIAGNOSIS — G4700 Insomnia, unspecified: Secondary | ICD-10-CM | POA: Diagnosis not present

## 2023-11-28 DIAGNOSIS — F03A3 Unspecified dementia, mild, with mood disturbance: Secondary | ICD-10-CM | POA: Diagnosis not present

## 2023-11-28 DIAGNOSIS — J9621 Acute and chronic respiratory failure with hypoxia: Secondary | ICD-10-CM | POA: Diagnosis not present

## 2023-11-28 DIAGNOSIS — S81802D Unspecified open wound, left lower leg, subsequent encounter: Secondary | ICD-10-CM | POA: Diagnosis not present

## 2023-11-29 DIAGNOSIS — N179 Acute kidney failure, unspecified: Secondary | ICD-10-CM | POA: Diagnosis not present

## 2023-11-29 DIAGNOSIS — J9622 Acute and chronic respiratory failure with hypercapnia: Secondary | ICD-10-CM | POA: Diagnosis not present

## 2023-11-29 DIAGNOSIS — S91102D Unspecified open wound of left great toe without damage to nail, subsequent encounter: Secondary | ICD-10-CM | POA: Diagnosis not present

## 2023-11-29 DIAGNOSIS — I429 Cardiomyopathy, unspecified: Secondary | ICD-10-CM | POA: Diagnosis not present

## 2023-11-29 DIAGNOSIS — K219 Gastro-esophageal reflux disease without esophagitis: Secondary | ICD-10-CM | POA: Diagnosis not present

## 2023-11-29 DIAGNOSIS — J9621 Acute and chronic respiratory failure with hypoxia: Secondary | ICD-10-CM | POA: Diagnosis not present

## 2023-11-29 DIAGNOSIS — N400 Enlarged prostate without lower urinary tract symptoms: Secondary | ICD-10-CM | POA: Diagnosis not present

## 2023-11-29 DIAGNOSIS — F03A18 Unspecified dementia, mild, with other behavioral disturbance: Secondary | ICD-10-CM | POA: Diagnosis not present

## 2023-11-29 DIAGNOSIS — I4891 Unspecified atrial fibrillation: Secondary | ICD-10-CM | POA: Diagnosis not present

## 2023-11-29 DIAGNOSIS — F32A Depression, unspecified: Secondary | ICD-10-CM | POA: Diagnosis not present

## 2023-11-29 DIAGNOSIS — I5022 Chronic systolic (congestive) heart failure: Secondary | ICD-10-CM | POA: Diagnosis not present

## 2023-11-29 DIAGNOSIS — F03A3 Unspecified dementia, mild, with mood disturbance: Secondary | ICD-10-CM | POA: Diagnosis not present

## 2023-11-29 DIAGNOSIS — S81802D Unspecified open wound, left lower leg, subsequent encounter: Secondary | ICD-10-CM | POA: Diagnosis not present

## 2023-11-29 DIAGNOSIS — J449 Chronic obstructive pulmonary disease, unspecified: Secondary | ICD-10-CM | POA: Diagnosis not present

## 2023-11-29 DIAGNOSIS — G4700 Insomnia, unspecified: Secondary | ICD-10-CM | POA: Diagnosis not present

## 2023-11-29 DIAGNOSIS — I110 Hypertensive heart disease with heart failure: Secondary | ICD-10-CM | POA: Diagnosis not present

## 2023-12-01 DIAGNOSIS — I890 Lymphedema, not elsewhere classified: Secondary | ICD-10-CM | POA: Diagnosis not present

## 2023-12-02 DIAGNOSIS — I4891 Unspecified atrial fibrillation: Secondary | ICD-10-CM | POA: Diagnosis not present

## 2023-12-02 DIAGNOSIS — S81802D Unspecified open wound, left lower leg, subsequent encounter: Secondary | ICD-10-CM | POA: Diagnosis not present

## 2023-12-02 DIAGNOSIS — K219 Gastro-esophageal reflux disease without esophagitis: Secondary | ICD-10-CM | POA: Diagnosis not present

## 2023-12-02 DIAGNOSIS — I429 Cardiomyopathy, unspecified: Secondary | ICD-10-CM | POA: Diagnosis not present

## 2023-12-02 DIAGNOSIS — N179 Acute kidney failure, unspecified: Secondary | ICD-10-CM | POA: Diagnosis not present

## 2023-12-02 DIAGNOSIS — N400 Enlarged prostate without lower urinary tract symptoms: Secondary | ICD-10-CM | POA: Diagnosis not present

## 2023-12-02 DIAGNOSIS — J9622 Acute and chronic respiratory failure with hypercapnia: Secondary | ICD-10-CM | POA: Diagnosis not present

## 2023-12-02 DIAGNOSIS — F03A3 Unspecified dementia, mild, with mood disturbance: Secondary | ICD-10-CM | POA: Diagnosis not present

## 2023-12-02 DIAGNOSIS — G4700 Insomnia, unspecified: Secondary | ICD-10-CM | POA: Diagnosis not present

## 2023-12-02 DIAGNOSIS — F32A Depression, unspecified: Secondary | ICD-10-CM | POA: Diagnosis not present

## 2023-12-02 DIAGNOSIS — S91102D Unspecified open wound of left great toe without damage to nail, subsequent encounter: Secondary | ICD-10-CM | POA: Diagnosis not present

## 2023-12-02 DIAGNOSIS — F03A18 Unspecified dementia, mild, with other behavioral disturbance: Secondary | ICD-10-CM | POA: Diagnosis not present

## 2023-12-02 DIAGNOSIS — J9621 Acute and chronic respiratory failure with hypoxia: Secondary | ICD-10-CM | POA: Diagnosis not present

## 2023-12-02 DIAGNOSIS — I110 Hypertensive heart disease with heart failure: Secondary | ICD-10-CM | POA: Diagnosis not present

## 2023-12-02 DIAGNOSIS — I5022 Chronic systolic (congestive) heart failure: Secondary | ICD-10-CM | POA: Diagnosis not present

## 2023-12-02 DIAGNOSIS — J449 Chronic obstructive pulmonary disease, unspecified: Secondary | ICD-10-CM | POA: Diagnosis not present

## 2023-12-03 ENCOUNTER — Telehealth: Payer: Self-pay | Admitting: Family Medicine

## 2023-12-03 NOTE — Telephone Encounter (Signed)
 [facility]  - Certification and POC  - Order [id number]

## 2023-12-04 DIAGNOSIS — J9622 Acute and chronic respiratory failure with hypercapnia: Secondary | ICD-10-CM | POA: Diagnosis not present

## 2023-12-04 DIAGNOSIS — S91102D Unspecified open wound of left great toe without damage to nail, subsequent encounter: Secondary | ICD-10-CM | POA: Diagnosis not present

## 2023-12-04 DIAGNOSIS — S81802D Unspecified open wound, left lower leg, subsequent encounter: Secondary | ICD-10-CM | POA: Diagnosis not present

## 2023-12-04 DIAGNOSIS — F03A18 Unspecified dementia, mild, with other behavioral disturbance: Secondary | ICD-10-CM | POA: Diagnosis not present

## 2023-12-04 DIAGNOSIS — F03A3 Unspecified dementia, mild, with mood disturbance: Secondary | ICD-10-CM | POA: Diagnosis not present

## 2023-12-04 DIAGNOSIS — F32A Depression, unspecified: Secondary | ICD-10-CM | POA: Diagnosis not present

## 2023-12-04 DIAGNOSIS — N400 Enlarged prostate without lower urinary tract symptoms: Secondary | ICD-10-CM | POA: Diagnosis not present

## 2023-12-04 DIAGNOSIS — N179 Acute kidney failure, unspecified: Secondary | ICD-10-CM | POA: Diagnosis not present

## 2023-12-04 DIAGNOSIS — K219 Gastro-esophageal reflux disease without esophagitis: Secondary | ICD-10-CM | POA: Diagnosis not present

## 2023-12-04 DIAGNOSIS — J9621 Acute and chronic respiratory failure with hypoxia: Secondary | ICD-10-CM | POA: Diagnosis not present

## 2023-12-04 DIAGNOSIS — I110 Hypertensive heart disease with heart failure: Secondary | ICD-10-CM | POA: Diagnosis not present

## 2023-12-04 DIAGNOSIS — J449 Chronic obstructive pulmonary disease, unspecified: Secondary | ICD-10-CM | POA: Diagnosis not present

## 2023-12-04 DIAGNOSIS — I429 Cardiomyopathy, unspecified: Secondary | ICD-10-CM | POA: Diagnosis not present

## 2023-12-04 DIAGNOSIS — G4700 Insomnia, unspecified: Secondary | ICD-10-CM | POA: Diagnosis not present

## 2023-12-04 DIAGNOSIS — I5022 Chronic systolic (congestive) heart failure: Secondary | ICD-10-CM | POA: Diagnosis not present

## 2023-12-04 DIAGNOSIS — I4891 Unspecified atrial fibrillation: Secondary | ICD-10-CM | POA: Diagnosis not present

## 2023-12-05 DIAGNOSIS — I4891 Unspecified atrial fibrillation: Secondary | ICD-10-CM

## 2023-12-05 DIAGNOSIS — F32A Depression, unspecified: Secondary | ICD-10-CM

## 2023-12-05 DIAGNOSIS — I429 Cardiomyopathy, unspecified: Secondary | ICD-10-CM

## 2023-12-05 DIAGNOSIS — F03A18 Unspecified dementia, mild, with other behavioral disturbance: Secondary | ICD-10-CM

## 2023-12-05 DIAGNOSIS — J449 Chronic obstructive pulmonary disease, unspecified: Secondary | ICD-10-CM | POA: Diagnosis not present

## 2023-12-05 DIAGNOSIS — J9621 Acute and chronic respiratory failure with hypoxia: Secondary | ICD-10-CM

## 2023-12-05 DIAGNOSIS — F03A3 Unspecified dementia, mild, with mood disturbance: Secondary | ICD-10-CM

## 2023-12-05 DIAGNOSIS — N179 Acute kidney failure, unspecified: Secondary | ICD-10-CM | POA: Diagnosis not present

## 2023-12-05 DIAGNOSIS — I110 Hypertensive heart disease with heart failure: Secondary | ICD-10-CM | POA: Diagnosis not present

## 2023-12-05 DIAGNOSIS — G4700 Insomnia, unspecified: Secondary | ICD-10-CM

## 2023-12-05 DIAGNOSIS — J9622 Acute and chronic respiratory failure with hypercapnia: Secondary | ICD-10-CM

## 2023-12-05 DIAGNOSIS — I5022 Chronic systolic (congestive) heart failure: Secondary | ICD-10-CM | POA: Diagnosis not present

## 2023-12-07 DIAGNOSIS — G4700 Insomnia, unspecified: Secondary | ICD-10-CM | POA: Diagnosis not present

## 2023-12-07 DIAGNOSIS — I4891 Unspecified atrial fibrillation: Secondary | ICD-10-CM | POA: Diagnosis not present

## 2023-12-07 DIAGNOSIS — J9622 Acute and chronic respiratory failure with hypercapnia: Secondary | ICD-10-CM | POA: Diagnosis not present

## 2023-12-07 DIAGNOSIS — I5022 Chronic systolic (congestive) heart failure: Secondary | ICD-10-CM | POA: Diagnosis not present

## 2023-12-07 DIAGNOSIS — F32A Depression, unspecified: Secondary | ICD-10-CM | POA: Diagnosis not present

## 2023-12-07 DIAGNOSIS — Z556 Problems related to health literacy: Secondary | ICD-10-CM | POA: Diagnosis not present

## 2023-12-07 DIAGNOSIS — N400 Enlarged prostate without lower urinary tract symptoms: Secondary | ICD-10-CM | POA: Diagnosis not present

## 2023-12-07 DIAGNOSIS — Z993 Dependence on wheelchair: Secondary | ICD-10-CM | POA: Diagnosis not present

## 2023-12-07 DIAGNOSIS — N179 Acute kidney failure, unspecified: Secondary | ICD-10-CM | POA: Diagnosis not present

## 2023-12-07 DIAGNOSIS — S91102D Unspecified open wound of left great toe without damage to nail, subsequent encounter: Secondary | ICD-10-CM | POA: Diagnosis not present

## 2023-12-07 DIAGNOSIS — F03A3 Unspecified dementia, mild, with mood disturbance: Secondary | ICD-10-CM | POA: Diagnosis not present

## 2023-12-07 DIAGNOSIS — Z7901 Long term (current) use of anticoagulants: Secondary | ICD-10-CM | POA: Diagnosis not present

## 2023-12-07 DIAGNOSIS — Z720 Tobacco use: Secondary | ICD-10-CM | POA: Diagnosis not present

## 2023-12-07 DIAGNOSIS — K219 Gastro-esophageal reflux disease without esophagitis: Secondary | ICD-10-CM | POA: Diagnosis not present

## 2023-12-07 DIAGNOSIS — I429 Cardiomyopathy, unspecified: Secondary | ICD-10-CM | POA: Diagnosis not present

## 2023-12-07 DIAGNOSIS — S81802D Unspecified open wound, left lower leg, subsequent encounter: Secondary | ICD-10-CM | POA: Diagnosis not present

## 2023-12-07 DIAGNOSIS — Z9981 Dependence on supplemental oxygen: Secondary | ICD-10-CM | POA: Diagnosis not present

## 2023-12-07 DIAGNOSIS — I110 Hypertensive heart disease with heart failure: Secondary | ICD-10-CM | POA: Diagnosis not present

## 2023-12-07 DIAGNOSIS — J449 Chronic obstructive pulmonary disease, unspecified: Secondary | ICD-10-CM | POA: Diagnosis not present

## 2023-12-07 DIAGNOSIS — F03A18 Unspecified dementia, mild, with other behavioral disturbance: Secondary | ICD-10-CM | POA: Diagnosis not present

## 2023-12-07 DIAGNOSIS — J9621 Acute and chronic respiratory failure with hypoxia: Secondary | ICD-10-CM | POA: Diagnosis not present

## 2023-12-07 DIAGNOSIS — G2581 Restless legs syndrome: Secondary | ICD-10-CM | POA: Diagnosis not present

## 2023-12-10 DIAGNOSIS — K219 Gastro-esophageal reflux disease without esophagitis: Secondary | ICD-10-CM | POA: Diagnosis not present

## 2023-12-10 DIAGNOSIS — J9622 Acute and chronic respiratory failure with hypercapnia: Secondary | ICD-10-CM | POA: Diagnosis not present

## 2023-12-10 DIAGNOSIS — G4700 Insomnia, unspecified: Secondary | ICD-10-CM | POA: Diagnosis not present

## 2023-12-10 DIAGNOSIS — N400 Enlarged prostate without lower urinary tract symptoms: Secondary | ICD-10-CM | POA: Diagnosis not present

## 2023-12-10 DIAGNOSIS — S91102D Unspecified open wound of left great toe without damage to nail, subsequent encounter: Secondary | ICD-10-CM | POA: Diagnosis not present

## 2023-12-10 DIAGNOSIS — S81802D Unspecified open wound, left lower leg, subsequent encounter: Secondary | ICD-10-CM | POA: Diagnosis not present

## 2023-12-10 DIAGNOSIS — I110 Hypertensive heart disease with heart failure: Secondary | ICD-10-CM | POA: Diagnosis not present

## 2023-12-10 DIAGNOSIS — J449 Chronic obstructive pulmonary disease, unspecified: Secondary | ICD-10-CM | POA: Diagnosis not present

## 2023-12-10 DIAGNOSIS — I429 Cardiomyopathy, unspecified: Secondary | ICD-10-CM | POA: Diagnosis not present

## 2023-12-10 DIAGNOSIS — J9621 Acute and chronic respiratory failure with hypoxia: Secondary | ICD-10-CM | POA: Diagnosis not present

## 2023-12-10 DIAGNOSIS — I5022 Chronic systolic (congestive) heart failure: Secondary | ICD-10-CM | POA: Diagnosis not present

## 2023-12-10 DIAGNOSIS — F03A18 Unspecified dementia, mild, with other behavioral disturbance: Secondary | ICD-10-CM | POA: Diagnosis not present

## 2023-12-10 DIAGNOSIS — F32A Depression, unspecified: Secondary | ICD-10-CM | POA: Diagnosis not present

## 2023-12-10 DIAGNOSIS — N179 Acute kidney failure, unspecified: Secondary | ICD-10-CM | POA: Diagnosis not present

## 2023-12-10 DIAGNOSIS — F03A3 Unspecified dementia, mild, with mood disturbance: Secondary | ICD-10-CM | POA: Diagnosis not present

## 2023-12-10 DIAGNOSIS — I4891 Unspecified atrial fibrillation: Secondary | ICD-10-CM | POA: Diagnosis not present

## 2023-12-12 ENCOUNTER — Telehealth: Payer: Self-pay

## 2023-12-12 NOTE — Telephone Encounter (Signed)
[facility] ORDERS ORDER #: [id number]

## 2023-12-16 DIAGNOSIS — Z6841 Body Mass Index (BMI) 40.0 and over, adult: Secondary | ICD-10-CM | POA: Diagnosis not present

## 2023-12-16 DIAGNOSIS — J9622 Acute and chronic respiratory failure with hypercapnia: Secondary | ICD-10-CM | POA: Diagnosis not present

## 2023-12-16 DIAGNOSIS — J9621 Acute and chronic respiratory failure with hypoxia: Secondary | ICD-10-CM | POA: Diagnosis not present

## 2023-12-18 ENCOUNTER — Telehealth: Payer: Self-pay | Admitting: Family Medicine

## 2023-12-18 DIAGNOSIS — J449 Chronic obstructive pulmonary disease, unspecified: Secondary | ICD-10-CM | POA: Diagnosis not present

## 2023-12-18 DIAGNOSIS — I110 Hypertensive heart disease with heart failure: Secondary | ICD-10-CM | POA: Diagnosis not present

## 2023-12-18 DIAGNOSIS — I5022 Chronic systolic (congestive) heart failure: Secondary | ICD-10-CM | POA: Diagnosis not present

## 2023-12-18 DIAGNOSIS — J9622 Acute and chronic respiratory failure with hypercapnia: Secondary | ICD-10-CM | POA: Diagnosis not present

## 2023-12-18 DIAGNOSIS — K219 Gastro-esophageal reflux disease without esophagitis: Secondary | ICD-10-CM | POA: Diagnosis not present

## 2023-12-18 DIAGNOSIS — F03A3 Unspecified dementia, mild, with mood disturbance: Secondary | ICD-10-CM | POA: Diagnosis not present

## 2023-12-18 DIAGNOSIS — J9621 Acute and chronic respiratory failure with hypoxia: Secondary | ICD-10-CM | POA: Diagnosis not present

## 2023-12-18 DIAGNOSIS — G4700 Insomnia, unspecified: Secondary | ICD-10-CM | POA: Diagnosis not present

## 2023-12-18 DIAGNOSIS — N400 Enlarged prostate without lower urinary tract symptoms: Secondary | ICD-10-CM | POA: Diagnosis not present

## 2023-12-18 DIAGNOSIS — F32A Depression, unspecified: Secondary | ICD-10-CM | POA: Diagnosis not present

## 2023-12-18 DIAGNOSIS — F03A18 Unspecified dementia, mild, with other behavioral disturbance: Secondary | ICD-10-CM | POA: Diagnosis not present

## 2023-12-18 DIAGNOSIS — S81802D Unspecified open wound, left lower leg, subsequent encounter: Secondary | ICD-10-CM | POA: Diagnosis not present

## 2023-12-18 DIAGNOSIS — N179 Acute kidney failure, unspecified: Secondary | ICD-10-CM | POA: Diagnosis not present

## 2023-12-18 DIAGNOSIS — S91102D Unspecified open wound of left great toe without damage to nail, subsequent encounter: Secondary | ICD-10-CM | POA: Diagnosis not present

## 2023-12-18 DIAGNOSIS — I429 Cardiomyopathy, unspecified: Secondary | ICD-10-CM | POA: Diagnosis not present

## 2023-12-18 DIAGNOSIS — I4891 Unspecified atrial fibrillation: Secondary | ICD-10-CM | POA: Diagnosis not present

## 2023-12-18 NOTE — Telephone Encounter (Signed)
[facility]-MISS[facility] VISIT NOTIFICATION FOR 12/05/23

## 2023-12-19 ENCOUNTER — Other Ambulatory Visit: Payer: Self-pay | Admitting: Family Medicine

## 2023-12-19 DIAGNOSIS — R351 Nocturia: Secondary | ICD-10-CM

## 2023-12-19 NOTE — Telephone Encounter (Signed)
Closing patient encounter, have made multiple attempts to contact patient in regards to PAP applications. Mailed apps to patient 12/31, and they were not retunred

## 2023-12-23 ENCOUNTER — Telehealth: Payer: Self-pay

## 2023-12-23 NOTE — Telephone Encounter (Signed)
ORDER#: [id number]

## 2023-12-25 DIAGNOSIS — J9622 Acute and chronic respiratory failure with hypercapnia: Secondary | ICD-10-CM | POA: Diagnosis not present

## 2023-12-25 DIAGNOSIS — J449 Chronic obstructive pulmonary disease, unspecified: Secondary | ICD-10-CM | POA: Diagnosis not present

## 2023-12-25 DIAGNOSIS — S81802D Unspecified open wound, left lower leg, subsequent encounter: Secondary | ICD-10-CM | POA: Diagnosis not present

## 2023-12-25 DIAGNOSIS — F03A3 Unspecified dementia, mild, with mood disturbance: Secondary | ICD-10-CM | POA: Diagnosis not present

## 2023-12-25 DIAGNOSIS — F32A Depression, unspecified: Secondary | ICD-10-CM | POA: Diagnosis not present

## 2023-12-25 DIAGNOSIS — I110 Hypertensive heart disease with heart failure: Secondary | ICD-10-CM | POA: Diagnosis not present

## 2023-12-25 DIAGNOSIS — F03A18 Unspecified dementia, mild, with other behavioral disturbance: Secondary | ICD-10-CM | POA: Diagnosis not present

## 2023-12-25 DIAGNOSIS — I5022 Chronic systolic (congestive) heart failure: Secondary | ICD-10-CM | POA: Diagnosis not present

## 2023-12-25 DIAGNOSIS — J9621 Acute and chronic respiratory failure with hypoxia: Secondary | ICD-10-CM | POA: Diagnosis not present

## 2023-12-25 DIAGNOSIS — N400 Enlarged prostate without lower urinary tract symptoms: Secondary | ICD-10-CM | POA: Diagnosis not present

## 2023-12-25 DIAGNOSIS — S91102D Unspecified open wound of left great toe without damage to nail, subsequent encounter: Secondary | ICD-10-CM | POA: Diagnosis not present

## 2023-12-25 DIAGNOSIS — I4891 Unspecified atrial fibrillation: Secondary | ICD-10-CM | POA: Diagnosis not present

## 2023-12-25 DIAGNOSIS — G4700 Insomnia, unspecified: Secondary | ICD-10-CM | POA: Diagnosis not present

## 2023-12-25 DIAGNOSIS — N179 Acute kidney failure, unspecified: Secondary | ICD-10-CM | POA: Diagnosis not present

## 2023-12-25 DIAGNOSIS — I429 Cardiomyopathy, unspecified: Secondary | ICD-10-CM | POA: Diagnosis not present

## 2023-12-25 DIAGNOSIS — K219 Gastro-esophageal reflux disease without esophagitis: Secondary | ICD-10-CM | POA: Diagnosis not present

## 2023-12-28 DIAGNOSIS — J449 Chronic obstructive pulmonary disease, unspecified: Secondary | ICD-10-CM | POA: Diagnosis not present

## 2023-12-31 DIAGNOSIS — F32A Depression, unspecified: Secondary | ICD-10-CM | POA: Diagnosis not present

## 2023-12-31 DIAGNOSIS — K219 Gastro-esophageal reflux disease without esophagitis: Secondary | ICD-10-CM | POA: Diagnosis not present

## 2023-12-31 DIAGNOSIS — I429 Cardiomyopathy, unspecified: Secondary | ICD-10-CM | POA: Diagnosis not present

## 2023-12-31 DIAGNOSIS — F03A3 Unspecified dementia, mild, with mood disturbance: Secondary | ICD-10-CM | POA: Diagnosis not present

## 2023-12-31 DIAGNOSIS — F03A18 Unspecified dementia, mild, with other behavioral disturbance: Secondary | ICD-10-CM | POA: Diagnosis not present

## 2023-12-31 DIAGNOSIS — I4891 Unspecified atrial fibrillation: Secondary | ICD-10-CM | POA: Diagnosis not present

## 2023-12-31 DIAGNOSIS — I5022 Chronic systolic (congestive) heart failure: Secondary | ICD-10-CM | POA: Diagnosis not present

## 2023-12-31 DIAGNOSIS — S81802D Unspecified open wound, left lower leg, subsequent encounter: Secondary | ICD-10-CM | POA: Diagnosis not present

## 2023-12-31 DIAGNOSIS — N179 Acute kidney failure, unspecified: Secondary | ICD-10-CM | POA: Diagnosis not present

## 2023-12-31 DIAGNOSIS — J449 Chronic obstructive pulmonary disease, unspecified: Secondary | ICD-10-CM | POA: Diagnosis not present

## 2023-12-31 DIAGNOSIS — G4700 Insomnia, unspecified: Secondary | ICD-10-CM | POA: Diagnosis not present

## 2023-12-31 DIAGNOSIS — N400 Enlarged prostate without lower urinary tract symptoms: Secondary | ICD-10-CM | POA: Diagnosis not present

## 2023-12-31 DIAGNOSIS — J9622 Acute and chronic respiratory failure with hypercapnia: Secondary | ICD-10-CM | POA: Diagnosis not present

## 2023-12-31 DIAGNOSIS — J9621 Acute and chronic respiratory failure with hypoxia: Secondary | ICD-10-CM | POA: Diagnosis not present

## 2023-12-31 DIAGNOSIS — I110 Hypertensive heart disease with heart failure: Secondary | ICD-10-CM | POA: Diagnosis not present

## 2023-12-31 DIAGNOSIS — S91102D Unspecified open wound of left great toe without damage to nail, subsequent encounter: Secondary | ICD-10-CM | POA: Diagnosis not present

## 2024-01-01 DIAGNOSIS — F03A3 Unspecified dementia, mild, with mood disturbance: Secondary | ICD-10-CM | POA: Diagnosis not present

## 2024-01-01 DIAGNOSIS — I110 Hypertensive heart disease with heart failure: Secondary | ICD-10-CM | POA: Diagnosis not present

## 2024-01-01 DIAGNOSIS — N400 Enlarged prostate without lower urinary tract symptoms: Secondary | ICD-10-CM | POA: Diagnosis not present

## 2024-01-01 DIAGNOSIS — S91102D Unspecified open wound of left great toe without damage to nail, subsequent encounter: Secondary | ICD-10-CM | POA: Diagnosis not present

## 2024-01-01 DIAGNOSIS — F03A18 Unspecified dementia, mild, with other behavioral disturbance: Secondary | ICD-10-CM | POA: Diagnosis not present

## 2024-01-01 DIAGNOSIS — J9621 Acute and chronic respiratory failure with hypoxia: Secondary | ICD-10-CM | POA: Diagnosis not present

## 2024-01-01 DIAGNOSIS — F32A Depression, unspecified: Secondary | ICD-10-CM | POA: Diagnosis not present

## 2024-01-01 DIAGNOSIS — J9622 Acute and chronic respiratory failure with hypercapnia: Secondary | ICD-10-CM | POA: Diagnosis not present

## 2024-01-01 DIAGNOSIS — S81802D Unspecified open wound, left lower leg, subsequent encounter: Secondary | ICD-10-CM | POA: Diagnosis not present

## 2024-01-01 DIAGNOSIS — N179 Acute kidney failure, unspecified: Secondary | ICD-10-CM | POA: Diagnosis not present

## 2024-01-01 DIAGNOSIS — I5022 Chronic systolic (congestive) heart failure: Secondary | ICD-10-CM | POA: Diagnosis not present

## 2024-01-01 DIAGNOSIS — J449 Chronic obstructive pulmonary disease, unspecified: Secondary | ICD-10-CM | POA: Diagnosis not present

## 2024-01-01 DIAGNOSIS — I429 Cardiomyopathy, unspecified: Secondary | ICD-10-CM | POA: Diagnosis not present

## 2024-01-01 DIAGNOSIS — G4700 Insomnia, unspecified: Secondary | ICD-10-CM | POA: Diagnosis not present

## 2024-01-01 DIAGNOSIS — K219 Gastro-esophageal reflux disease without esophagitis: Secondary | ICD-10-CM | POA: Diagnosis not present

## 2024-01-01 DIAGNOSIS — I4891 Unspecified atrial fibrillation: Secondary | ICD-10-CM | POA: Diagnosis not present

## 2024-01-01 DIAGNOSIS — I890 Lymphedema, not elsewhere classified: Secondary | ICD-10-CM | POA: Diagnosis not present

## 2024-01-03 ENCOUNTER — Other Ambulatory Visit: Payer: Self-pay | Admitting: Family Medicine

## 2024-01-03 DIAGNOSIS — G2581 Restless legs syndrome: Secondary | ICD-10-CM

## 2024-01-16 DIAGNOSIS — J9621 Acute and chronic respiratory failure with hypoxia: Secondary | ICD-10-CM | POA: Diagnosis not present

## 2024-01-16 DIAGNOSIS — Z6841 Body Mass Index (BMI) 40.0 and over, adult: Secondary | ICD-10-CM | POA: Diagnosis not present

## 2024-01-16 DIAGNOSIS — J9622 Acute and chronic respiratory failure with hypercapnia: Secondary | ICD-10-CM | POA: Diagnosis not present

## 2024-01-25 DIAGNOSIS — J449 Chronic obstructive pulmonary disease, unspecified: Secondary | ICD-10-CM | POA: Diagnosis not present

## 2024-01-29 DIAGNOSIS — I890 Lymphedema, not elsewhere classified: Secondary | ICD-10-CM | POA: Diagnosis not present

## 2024-02-04 ENCOUNTER — Other Ambulatory Visit: Payer: Self-pay | Admitting: Family Medicine

## 2024-02-04 DIAGNOSIS — I5042 Chronic combined systolic (congestive) and diastolic (congestive) heart failure: Secondary | ICD-10-CM

## 2024-02-05 ENCOUNTER — Other Ambulatory Visit: Payer: Self-pay

## 2024-02-06 MED ORDER — PREGABALIN 75 MG PO CAPS: 75.0000 mg | ORAL_CAPSULE | Freq: Two times a day (BID) | ORAL | 1 refills | Status: DC

## 2024-02-08 ENCOUNTER — Other Ambulatory Visit: Payer: Self-pay | Admitting: Family Medicine

## 2024-02-08 ENCOUNTER — Other Ambulatory Visit: Payer: Self-pay

## 2024-02-08 DIAGNOSIS — M1A09X0 Idiopathic chronic gout, multiple sites, without tophus (tophi): Secondary | ICD-10-CM

## 2024-02-13 DIAGNOSIS — Z6841 Body Mass Index (BMI) 40.0 and over, adult: Secondary | ICD-10-CM | POA: Diagnosis not present

## 2024-02-13 DIAGNOSIS — J9622 Acute and chronic respiratory failure with hypercapnia: Secondary | ICD-10-CM | POA: Diagnosis not present

## 2024-02-13 DIAGNOSIS — J9621 Acute and chronic respiratory failure with hypoxia: Secondary | ICD-10-CM | POA: Diagnosis not present

## 2024-02-17 ENCOUNTER — Other Ambulatory Visit: Payer: Self-pay | Admitting: Family Medicine

## 2024-02-25 DIAGNOSIS — J449 Chronic obstructive pulmonary disease, unspecified: Secondary | ICD-10-CM | POA: Diagnosis not present

## 2024-02-27 DIAGNOSIS — J1282 Pneumonia due to coronavirus disease 2019: Secondary | ICD-10-CM | POA: Diagnosis not present

## 2024-02-27 DIAGNOSIS — Z6841 Body Mass Index (BMI) 40.0 and over, adult: Secondary | ICD-10-CM | POA: Diagnosis not present

## 2024-02-27 DIAGNOSIS — R531 Weakness: Secondary | ICD-10-CM | POA: Diagnosis not present

## 2024-02-27 DIAGNOSIS — N400 Enlarged prostate without lower urinary tract symptoms: Secondary | ICD-10-CM | POA: Diagnosis not present

## 2024-02-27 DIAGNOSIS — I4891 Unspecified atrial fibrillation: Secondary | ICD-10-CM | POA: Diagnosis not present

## 2024-02-27 DIAGNOSIS — R9431 Abnormal electrocardiogram [ECG] [EKG]: Secondary | ICD-10-CM | POA: Diagnosis not present

## 2024-02-27 DIAGNOSIS — M109 Gout, unspecified: Secondary | ICD-10-CM | POA: Diagnosis not present

## 2024-02-27 DIAGNOSIS — U071 COVID-19: Secondary | ICD-10-CM | POA: Diagnosis not present

## 2024-02-27 DIAGNOSIS — R0689 Other abnormalities of breathing: Secondary | ICD-10-CM | POA: Diagnosis not present

## 2024-02-27 DIAGNOSIS — G629 Polyneuropathy, unspecified: Secondary | ICD-10-CM | POA: Diagnosis not present

## 2024-02-27 DIAGNOSIS — R069 Unspecified abnormalities of breathing: Secondary | ICD-10-CM | POA: Diagnosis not present

## 2024-02-27 DIAGNOSIS — R0902 Hypoxemia: Secondary | ICD-10-CM | POA: Diagnosis not present

## 2024-02-27 DIAGNOSIS — I5042 Chronic combined systolic (congestive) and diastolic (congestive) heart failure: Secondary | ICD-10-CM | POA: Diagnosis not present

## 2024-02-28 DIAGNOSIS — R531 Weakness: Secondary | ICD-10-CM | POA: Diagnosis not present

## 2024-02-28 DIAGNOSIS — Z6841 Body Mass Index (BMI) 40.0 and over, adult: Secondary | ICD-10-CM | POA: Diagnosis not present

## 2024-02-28 DIAGNOSIS — Z880 Allergy status to penicillin: Secondary | ICD-10-CM | POA: Diagnosis not present

## 2024-02-28 DIAGNOSIS — G4733 Obstructive sleep apnea (adult) (pediatric): Secondary | ICD-10-CM | POA: Diagnosis not present

## 2024-02-28 DIAGNOSIS — Z87442 Personal history of urinary calculi: Secondary | ICD-10-CM | POA: Diagnosis not present

## 2024-02-28 DIAGNOSIS — J9601 Acute respiratory failure with hypoxia: Secondary | ICD-10-CM | POA: Diagnosis not present

## 2024-02-28 DIAGNOSIS — Z885 Allergy status to narcotic agent status: Secondary | ICD-10-CM | POA: Diagnosis not present

## 2024-02-28 DIAGNOSIS — M109 Gout, unspecified: Secondary | ICD-10-CM | POA: Diagnosis not present

## 2024-02-28 DIAGNOSIS — R062 Wheezing: Secondary | ICD-10-CM | POA: Diagnosis not present

## 2024-02-28 DIAGNOSIS — I110 Hypertensive heart disease with heart failure: Secondary | ICD-10-CM | POA: Diagnosis not present

## 2024-02-28 DIAGNOSIS — R9431 Abnormal electrocardiogram [ECG] [EKG]: Secondary | ICD-10-CM | POA: Diagnosis not present

## 2024-02-28 DIAGNOSIS — Z888 Allergy status to other drugs, medicaments and biological substances status: Secondary | ICD-10-CM | POA: Diagnosis not present

## 2024-02-28 DIAGNOSIS — J441 Chronic obstructive pulmonary disease with (acute) exacerbation: Secondary | ICD-10-CM | POA: Diagnosis not present

## 2024-02-28 DIAGNOSIS — E7800 Pure hypercholesterolemia, unspecified: Secondary | ICD-10-CM | POA: Diagnosis not present

## 2024-02-28 DIAGNOSIS — M1990 Unspecified osteoarthritis, unspecified site: Secondary | ICD-10-CM | POA: Diagnosis not present

## 2024-02-28 DIAGNOSIS — U071 COVID-19: Secondary | ICD-10-CM | POA: Diagnosis not present

## 2024-02-28 DIAGNOSIS — R0602 Shortness of breath: Secondary | ICD-10-CM | POA: Diagnosis not present

## 2024-02-28 DIAGNOSIS — G2581 Restless legs syndrome: Secondary | ICD-10-CM | POA: Diagnosis not present

## 2024-02-28 DIAGNOSIS — R0902 Hypoxemia: Secondary | ICD-10-CM | POA: Diagnosis not present

## 2024-02-28 DIAGNOSIS — I5042 Chronic combined systolic (congestive) and diastolic (congestive) heart failure: Secondary | ICD-10-CM | POA: Diagnosis not present

## 2024-02-28 DIAGNOSIS — R0689 Other abnormalities of breathing: Secondary | ICD-10-CM | POA: Diagnosis not present

## 2024-02-28 DIAGNOSIS — F17210 Nicotine dependence, cigarettes, uncomplicated: Secondary | ICD-10-CM | POA: Diagnosis not present

## 2024-02-28 DIAGNOSIS — R069 Unspecified abnormalities of breathing: Secondary | ICD-10-CM | POA: Diagnosis not present

## 2024-02-28 DIAGNOSIS — G629 Polyneuropathy, unspecified: Secondary | ICD-10-CM | POA: Diagnosis not present

## 2024-02-28 DIAGNOSIS — Z881 Allergy status to other antibiotic agents status: Secondary | ICD-10-CM | POA: Diagnosis not present

## 2024-02-28 DIAGNOSIS — Z8701 Personal history of pneumonia (recurrent): Secondary | ICD-10-CM | POA: Diagnosis not present

## 2024-02-28 DIAGNOSIS — F32A Depression, unspecified: Secondary | ICD-10-CM | POA: Diagnosis not present

## 2024-02-28 DIAGNOSIS — N400 Enlarged prostate without lower urinary tract symptoms: Secondary | ICD-10-CM | POA: Diagnosis not present

## 2024-02-28 DIAGNOSIS — I4891 Unspecified atrial fibrillation: Secondary | ICD-10-CM | POA: Diagnosis not present

## 2024-02-28 DIAGNOSIS — J1282 Pneumonia due to coronavirus disease 2019: Secondary | ICD-10-CM | POA: Diagnosis not present

## 2024-03-01 NOTE — Progress Notes (Deleted)
 Subjective:  Patient ID: Shawn Osborn, male    DOB: [birth year]  Age: 73 y.o. MRN: [id number]  No chief complaint on file.   Discussed the use of AI scribe software for clinical note transcription with the patient, who gave verbal consent to proceed.         09/09/2023    1:50 PM 06/19/2023    2:06 PM 03/27/2023   11:25 AM 10/30/2022    4:14 PM 10/17/2022   10:53 AM  Depression screen PHQ 2/9  Decreased Interest 0 0 0 0 0  Down, Depressed, Hopeless 0 0 0 0 0  PHQ - 2 Score 0 0 0 0 0  Altered sleeping   0    Tired, decreased energy   2    Change in appetite   0    Feeling bad or failure about yourself    0    Trouble concentrating   0    Moving slowly or fidgety/restless   0    Suicidal thoughts   0    PHQ-9 Score   2    Difficult doing work/chores   Not difficult at all          09/09/2023    1:50 PM  Fall Risk   Falls in the past year? 1  Injury with Fall? 1  Risk for fall due to : Impaired mobility;Impaired balance/gait  Follow up Falls evaluation completed;Falls prevention discussed    Patient Care Team: Cox, Burleigh Carp, MD as PCP - General (Family Medicine) Tobb, Kardie, DO as PCP - Cardiology (Cardiology)   Review of Systems  Current Outpatient Medications on File Prior to Visit  Medication Sig Dispense Refill   albuterol (PROVENTIL) (2.5 MG/3ML) 0.083% nebulizer solution Take 3 mLs (2.5 mg total) by nebulization every 6 (six) hours as needed for wheezing or shortness of breath. 360 mL 12   albuterol (VENTOLIN HFA) 108 (90 Base) MCG/ACT inhaler Inhale 2 puffs into the lungs every 6 (six) hours as needed for wheezing or shortness of breath. 6.7 g 12   allopurinol (ZYLOPRIM) 300 MG tablet TAKE ONE TABLET BY MOUTH EVERY DAY at 1pm 30 tablet 3   apixaban (ELIQUIS) 5 MG TABS tablet Take 1 tablet (5 mg total) by mouth 2 (two) times daily. 60 tablet 0   donepezil (ARICEPT) 10 MG tablet TAKE ONE TABLET BY MOUTH EVERY EVENING 90 tablet 3   doxycycline (VIBRA-TABS)  100 MG tablet Take 1 tablet (100 mg total) by mouth 2 (two) times daily. 14 tablet 0   furosemide (LASIX) 40 MG tablet TAKE ONE TABLET BY MOUTH TWICE DAILY 60 tablet 2   hydrOXYzine (ATARAX) 50 MG tablet TAKE ONE TABLET BY MOUTH AT BEDTIME 30 tablet 2   ipratropium-albuterol (DUONEB) 0.5-2.5 (3) MG/3ML SOLN Take 3 mLs by nebulization every 4 (four) hours as needed. (Patient taking differently: Take 3 mLs by nebulization every 4 (four) hours as needed (SOB).) 360 mL 2   memantine (NAMENDA XR) 28 MG CP24 24 hr capsule TAKE ONE CAPSULE BY MOUTH AT BEDTIME 90 capsule 1   omega-3 acid ethyl esters (LOVAZA) 1 g capsule TAKE 2 CAPSULES BY MOUTH TWICE DAILY 360 capsule 1   omeprazole (PRILOSEC) 40 MG capsule TAKE ONE CAPSULE BY MOUTH EVERY DAY 30 capsule 1   potassium chloride SA (KLOR-CON M) 20 MEQ tablet Take 1 tablet (20 mEq total) by mouth daily. 30 tablet 0   pregabalin (LYRICA) 75 MG capsule TAKE ONE CAPSULE BY MOUTH EVERY DAY  and TAKE ONE CAPSULE BY MOUTH EVERY DAY at 1pm and TAKE ONE CAPSULE BY MOUTH AT BEDTIME 90 capsule 2   propranolol ER (INDERAL LA) 60 MG 24 hr capsule TAKE ONE CAPSULE BY MOUTH EVERY DAY 30 capsule 5   QUEtiapine (SEROQUEL) 50 MG tablet TAKE ONE TABLET BY MOUTH AT BEDTIME 30 tablet 11   ramelteon (ROZEREM) 8 MG tablet TAKE ONE TABLET BY MOUTH AT BEDTIME 30 tablet 11   rOPINIRole (REQUIP) 0.5 MG tablet TAKE ONE TABLET BY MOUTH AT BEDTIME 30 tablet 2   rosuvastatin (CRESTOR) 10 MG tablet Take 1 tablet (10 mg total) by mouth daily. 90 tablet 1   sertraline (ZOLOFT) 100 MG tablet TAKE ONE TABLET BY MOUTH AT BEDTIME 30 tablet 2   tamsulosin (FLOMAX) 0.4 MG CAPS capsule TAKE 2 CAPSULES BY MOUTH EVERY EVENING 60 capsule 5   Vitamin D, Ergocalciferol, (DRISDOL) 1.25 MG (50000 UNIT) CAPS capsule TAKE ONE CAPSULE BY MOUTH EVERY WEEK 15 capsule 3   No current facility-administered medications on file prior to visit.   Past Medical History:  Diagnosis Date   Acute combined systolic and  diastolic CHF, NYHA class 3 (HCC) 10/08/2019   Acute delirium 10/08/2019   Acute exacerbation of CHF (congestive heart failure) (HCC) 10/08/2019   Acute gout of multiple sites 07/01/2016   Acute respiratory failure with hypoxia and hypercapnia (HCC) 10/08/2019   Allergy    Anxiety    Arthritis    Gout, osteoarthritis in Back and Knees   Auditory hallucinations 05/01/2022   Cataract    CHF (congestive heart failure) (HCC)    Chronic bronchitis    Community acquired pneumonia 10/08/2019   COPD (chronic obstructive pulmonary disease) (HCC) 10/08/2019   Dementia (HCC) 10/08/2019   Elevated troponin 10/08/2019   Emphysema of lung (HCC)    Essential hypertension 07/03/2016   GERD (gastroesophageal reflux disease)    History of kidney stones    HLD (hyperlipidemia) 10/08/2019   Hypercapnic respiratory failure (HCC) 10/08/2019   Hyperlipidemia    Hypertension    Hypertensive heart disease 10/08/2019   Morbid obesity due to excess calories (HCC) 10/08/2019   Narcotic overdose (HCC) 10/08/2019   Neuropathy    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   NSTEMI (non-ST elevated myocardial infarction) (HCC) 10/08/2019   Obstructive sleep apnea    Occlusion and stenosis of carotid artery without mention of cerebral infarction 12/13/2011   OSA on CPAP 10/08/2019   Pain in joint of left shoulder 01/12/2019   Paranoia (psychosis) (HCC) 07/01/2016   Poorly-controlled hypertension 10/08/2019   Prediabetes    Psychotic disorder with delusions (HCC) 07/01/2016   Respiratory failure with hypoxia and hypercapnia (HCC) 10/08/2019   Restless leg syndrome 07/03/2016   Rheumatoid arthritis(714.0)    RLS (restless legs syndrome)    Sepsis (HCC) 10/08/2019   Sleep apnea    Spinal stenosis    Syncope 04/03/2019   Tear of left rotator cuff 01/12/2019   Toxic metabolic encephalopathy 10/08/2019   UTI (urinary tract infection) 10/08/2019   Vascular dementia with behavioral disturbance (HCC) 07/06/2016    Visual hallucinations 05/01/2022   Past Surgical History:  Procedure Laterality Date   AMPUTATION TOE Left 01/01/2023   APPENDECTOMY     CARPAL TUNNEL RELEASE     CORONARY PRESSURE/FFR STUDY N/A 10/20/2019   Procedure: INTRAVASCULAR PRESSURE WIRE/FFR STUDY;  Surgeon: Arty Binning, MD;  Location: [facility];  Service: Cardiovascular;  Laterality: N/A;   EYE SURGERY  LEFT HEART CATH AND CORONARY ANGIOGRAPHY N/A 10/20/2019   Procedure: LEFT HEART CATH AND CORONARY ANGIOGRAPHY;  Surgeon: Arty Binning, MD;  Location: [facility];  Service: Cardiovascular;  Laterality: N/A;   SPINE SURGERY  1998, 2003, 2012   Laminectomy X 3    Family History  Problem Relation Age of Onset   Heart disease Mother    Hypertension Mother    Social History   Socioeconomic History   Marital status: Widowed    Spouse name: Not on file   Number of children: 4   Years of education: Not on file   Highest education level: 7th grade  Occupational History   Not on file  Tobacco Use   Smoking status: Former    Current packs/day: 0.75    Average packs/day: 0.8 packs/day for 58.3 years (43.7 ttl pk-yrs)    Types: Cigarettes    Start date: 44   Smokeless tobacco: Current    Types: Chew  Vaping Use   Vaping status: Never Used  Substance and Sexual Activity   Alcohol use: No   Drug use: No   Sexual activity: Not Currently  Other Topics Concern   Not on file  Social History Narrative   Not on file   Social Drivers of Health   Financial Resource Strain: Low Risk  (06/19/2023)   Overall Financial Resource Strain (CARDIA)    Difficulty of Paying Living Expenses: Not very hard  Food Insecurity: No Food Insecurity (06/19/2023)   Hunger Vital Sign    Worried About Running Out of Food in the Last Year: Never true    Ran Out of Food in the Last Year: Never true  Transportation Needs: No Transportation Needs (06/19/2023)   PRAPARE - Administrator, Civil Service (Medical): No     Lack of Transportation (Non-Medical): No  Physical Activity: Inactive (06/19/2023)   Exercise Vital Sign    Days of Exercise per Week: 0 days    Minutes of Exercise per Session: 0 min  Stress: No Stress Concern Present (06/19/2023)   [employer] of Occupational Health - Occupational Stress Questionnaire    Feeling of Stress : Not at all  Social Connections: Moderately Isolated (11/19/2023)   Social Connection and Isolation Panel [NHANES]    Frequency of Communication with Friends and Family: Three times a week    Frequency of Social Gatherings with Friends and Family: Three times a week    Attends Religious Services: More than 4 times per year    Active Member of Clubs or Organizations: No    Attends Banker Meetings: Never    Marital Status: Widowed    Objective:  There were no vitals taken for this visit.     11/19/2023   10:08 AM 09/09/2023    1:44 PM 05/22/2023    2:39 PM  BP/Weight  Systolic BP 128 110 124  Diastolic BP 64 60 68  Wt. (Lbs) 243 325 230  BMI 34.87 kg/m2 46.63 kg/m2 32.08 kg/m2    Physical Exam  Diabetic Foot Exam - Simple   No data filed      Lab Results  Component Value Date   WBC 11.9 (H) 11/19/2023   HGB 13.3 11/19/2023   HCT 42.8 11/19/2023   PLT 272 11/19/2023   GLUCOSE 91 11/19/2023   CHOL 184 11/19/2023   TRIG 192 (H) 11/19/2023   HDL 45 11/19/2023   LDLCALC 106 (H) 11/19/2023   ALT 13 11/19/2023  AST 15 11/19/2023   NA 147 (H) 11/19/2023   K 5.2 11/19/2023   CL 107 (H) 11/19/2023   CREATININE 0.72 (L) 11/19/2023   BUN 11 11/19/2023   CO2 24 11/19/2023   TSH 1.850 05/22/2023   INR 0.9 08/30/2020   HGBA1C 5.7 (H) 09/09/2023      Assessment & Plan:  Assessment and Plan       There are no diagnoses linked to this encounter.   No orders of the defined types were placed in this encounter.   No orders of the defined types were placed in this encounter.    Follow-up: No follow-ups on  file.   I,Spence Soberano I Leal-Borjas,acting as a scribe for Mercy Stall, MD.,have documented all relevant documentation on the behalf of Mercy Stall, MD,as directed by  Mercy Stall, MD while in the presence of Mercy Stall, MD.   An After Visit Summary was printed and given to the patient.  Mercy Stall, MD [facility] [phone]

## 2024-03-02 ENCOUNTER — Encounter: Payer: Medicare Other | Admitting: Family Medicine

## 2024-03-04 ENCOUNTER — Telehealth: Payer: Self-pay | Admitting: Family Medicine

## 2024-03-04 ENCOUNTER — Telehealth: Payer: Self-pay

## 2024-03-04 NOTE — Transitions of Care (Post Inpatient/ED Visit) (Signed)
   03/04/2024  Name: DAMAIN BROADUS MRN: [id number] DOB: [birth year]  Today's TOC FU Call Status: Today's TOC FU Call Status:: Unsuccessful Call (1st Attempt) Unsuccessful Call (1st Attempt) Date: 03/04/24  Attempted to reach the patient regarding the most recent Inpatient/ED visit.  Follow Up Plan: Additional outreach attempts will be made to reach the patient to complete the Transitions of Care (Post Inpatient/ED visit) call.   Tonia Frankel RN, CCM [place]  VBCI-Population Health RN Care Manager [phone]

## 2024-03-04 NOTE — Telephone Encounter (Signed)
 Copied from CRM [id number]. Topic: General - Other >> Mar 04, 2024  9:22 AM Sasha H wrote: Reason for CRM: pt needs hospital follow up and nothing is available until June. Please give pts daughter, Concha Deed, a phone call at [phone].

## 2024-03-04 NOTE — Telephone Encounter (Signed)
 Called and spoke with patient's daughter Concha Deed in regard to scheduling a hospital follow up.Concha Deed was okay with patient seeing Dr.Sirivol next week. Appointment has been scheduled for Thursday April 24th.  Discharge summary is in the patient chart and has been routed to Dr.Cox.   Concha Deed stated that patient is out of oxygen tanks and the home health company does not come out to service anymore. Hospital informed Concha Deed that they needed to reach out to Dr.Cox to get an order for more tanks.

## 2024-03-05 ENCOUNTER — Telehealth: Payer: Self-pay

## 2024-03-05 ENCOUNTER — Encounter: Payer: Self-pay | Admitting: *Deleted

## 2024-03-05 DIAGNOSIS — F0393 Unspecified dementia, unspecified severity, with mood disturbance: Secondary | ICD-10-CM | POA: Diagnosis not present

## 2024-03-05 DIAGNOSIS — G4733 Obstructive sleep apnea (adult) (pediatric): Secondary | ICD-10-CM | POA: Diagnosis not present

## 2024-03-05 DIAGNOSIS — E785 Hyperlipidemia, unspecified: Secondary | ICD-10-CM | POA: Diagnosis not present

## 2024-03-05 DIAGNOSIS — Z9981 Dependence on supplemental oxygen: Secondary | ICD-10-CM | POA: Diagnosis not present

## 2024-03-05 DIAGNOSIS — G629 Polyneuropathy, unspecified: Secondary | ICD-10-CM | POA: Diagnosis not present

## 2024-03-05 DIAGNOSIS — F17210 Nicotine dependence, cigarettes, uncomplicated: Secondary | ICD-10-CM | POA: Diagnosis not present

## 2024-03-05 DIAGNOSIS — Z7982 Long term (current) use of aspirin: Secondary | ICD-10-CM | POA: Diagnosis not present

## 2024-03-05 DIAGNOSIS — U071 COVID-19: Secondary | ICD-10-CM | POA: Diagnosis not present

## 2024-03-05 DIAGNOSIS — G2581 Restless legs syndrome: Secondary | ICD-10-CM | POA: Diagnosis not present

## 2024-03-05 DIAGNOSIS — I110 Hypertensive heart disease with heart failure: Secondary | ICD-10-CM | POA: Diagnosis not present

## 2024-03-05 DIAGNOSIS — F32A Depression, unspecified: Secondary | ICD-10-CM | POA: Diagnosis not present

## 2024-03-05 DIAGNOSIS — I5033 Acute on chronic diastolic (congestive) heart failure: Secondary | ICD-10-CM | POA: Diagnosis not present

## 2024-03-05 DIAGNOSIS — J1282 Pneumonia due to coronavirus disease 2019: Secondary | ICD-10-CM | POA: Diagnosis not present

## 2024-03-05 DIAGNOSIS — I4891 Unspecified atrial fibrillation: Secondary | ICD-10-CM | POA: Diagnosis not present

## 2024-03-05 DIAGNOSIS — J440 Chronic obstructive pulmonary disease with acute lower respiratory infection: Secondary | ICD-10-CM | POA: Diagnosis not present

## 2024-03-05 DIAGNOSIS — J439 Emphysema, unspecified: Secondary | ICD-10-CM | POA: Diagnosis not present

## 2024-03-05 DIAGNOSIS — I5022 Chronic systolic (congestive) heart failure: Secondary | ICD-10-CM | POA: Diagnosis not present

## 2024-03-05 DIAGNOSIS — F03918 Unspecified dementia, unspecified severity, with other behavioral disturbance: Secondary | ICD-10-CM | POA: Diagnosis not present

## 2024-03-05 DIAGNOSIS — J441 Chronic obstructive pulmonary disease with (acute) exacerbation: Secondary | ICD-10-CM | POA: Diagnosis not present

## 2024-03-05 DIAGNOSIS — N401 Enlarged prostate with lower urinary tract symptoms: Secondary | ICD-10-CM | POA: Diagnosis not present

## 2024-03-05 DIAGNOSIS — R351 Nocturia: Secondary | ICD-10-CM | POA: Diagnosis not present

## 2024-03-05 DIAGNOSIS — D649 Anemia, unspecified: Secondary | ICD-10-CM | POA: Diagnosis not present

## 2024-03-05 DIAGNOSIS — Z6839 Body mass index (BMI) 39.0-39.9, adult: Secondary | ICD-10-CM | POA: Diagnosis not present

## 2024-03-05 DIAGNOSIS — J9621 Acute and chronic respiratory failure with hypoxia: Secondary | ICD-10-CM | POA: Diagnosis not present

## 2024-03-05 NOTE — Transitions of Care (Post Inpatient/ED Visit) (Signed)
   03/05/2024  Name: SHAIN PAUWELS MRN: [id number] DOB: 10/08/1951  Today's TOC FU Call Status: Today's TOC FU Call Status:: Unsuccessful Call (2nd Attempt) Unsuccessful Call (2nd Attempt) Date: 03/05/24  Attempted to reach the patient regarding the most recent Inpatient/ED visit.  Follow Up Plan: Additional outreach attempts will be made to reach the patient to complete the Transitions of Care (Post Inpatient/ED visit) call.   Tonia Frankel RN, CCM [place]  VBCI-Population Health RN Care Manager [phone]

## 2024-03-05 NOTE — Telephone Encounter (Signed)
 This encounter was created in error - please disregard.

## 2024-03-06 ENCOUNTER — Telehealth: Payer: Self-pay

## 2024-03-06 DIAGNOSIS — U071 COVID-19: Secondary | ICD-10-CM | POA: Diagnosis not present

## 2024-03-06 DIAGNOSIS — J9611 Chronic respiratory failure with hypoxia: Secondary | ICD-10-CM | POA: Diagnosis not present

## 2024-03-06 NOTE — Transitions of Care (Post Inpatient/ED Visit) (Signed)
   03/06/2024  Name: Shawn Osborn MRN: [id number] DOB: 1951/02/23  Today's TOC FU Call Status: Today's TOC FU Call Status:: Unsuccessful Call (3rd Attempt) Unsuccessful Call (3rd Attempt) Date: 03/06/24  Attempted to reach the patient regarding the most recent Inpatient/ED visit.  Follow Up Plan: No further outreach attempts will be made at this time. We have been unable to contact the patient.  Tonia Frankel RN, CCM [place]  VBCI-Population Health RN Care Manager [phone]

## 2024-03-07 NOTE — Progress Notes (Unsigned)
 This encounter was created in error - please disregard.

## 2024-03-09 ENCOUNTER — Telehealth: Payer: Self-pay | Admitting: Family Medicine

## 2024-03-09 ENCOUNTER — Encounter: Payer: Self-pay | Admitting: Family Medicine

## 2024-03-09 ENCOUNTER — Telehealth: Payer: Self-pay

## 2024-03-09 DIAGNOSIS — J9611 Chronic respiratory failure with hypoxia: Secondary | ICD-10-CM | POA: Diagnosis not present

## 2024-03-09 DIAGNOSIS — J440 Chronic obstructive pulmonary disease with acute lower respiratory infection: Secondary | ICD-10-CM | POA: Diagnosis not present

## 2024-03-09 DIAGNOSIS — I5033 Acute on chronic diastolic (congestive) heart failure: Secondary | ICD-10-CM | POA: Diagnosis not present

## 2024-03-09 DIAGNOSIS — J441 Chronic obstructive pulmonary disease with (acute) exacerbation: Secondary | ICD-10-CM | POA: Diagnosis not present

## 2024-03-09 DIAGNOSIS — J439 Emphysema, unspecified: Secondary | ICD-10-CM | POA: Diagnosis not present

## 2024-03-09 DIAGNOSIS — J449 Chronic obstructive pulmonary disease, unspecified: Secondary | ICD-10-CM | POA: Diagnosis not present

## 2024-03-09 DIAGNOSIS — I110 Hypertensive heart disease with heart failure: Secondary | ICD-10-CM | POA: Diagnosis not present

## 2024-03-09 DIAGNOSIS — Z6839 Body mass index (BMI) 39.0-39.9, adult: Secondary | ICD-10-CM | POA: Diagnosis not present

## 2024-03-09 DIAGNOSIS — F0393 Unspecified dementia, unspecified severity, with mood disturbance: Secondary | ICD-10-CM | POA: Diagnosis not present

## 2024-03-09 DIAGNOSIS — F03918 Unspecified dementia, unspecified severity, with other behavioral disturbance: Secondary | ICD-10-CM | POA: Diagnosis not present

## 2024-03-09 DIAGNOSIS — I4891 Unspecified atrial fibrillation: Secondary | ICD-10-CM | POA: Diagnosis not present

## 2024-03-09 DIAGNOSIS — F32A Depression, unspecified: Secondary | ICD-10-CM | POA: Diagnosis not present

## 2024-03-09 DIAGNOSIS — J9621 Acute and chronic respiratory failure with hypoxia: Secondary | ICD-10-CM | POA: Diagnosis not present

## 2024-03-09 DIAGNOSIS — J1282 Pneumonia due to coronavirus disease 2019: Secondary | ICD-10-CM | POA: Diagnosis not present

## 2024-03-09 DIAGNOSIS — U071 COVID-19: Secondary | ICD-10-CM | POA: Diagnosis not present

## 2024-03-09 DIAGNOSIS — G4733 Obstructive sleep apnea (adult) (pediatric): Secondary | ICD-10-CM | POA: Diagnosis not present

## 2024-03-09 DIAGNOSIS — I5022 Chronic systolic (congestive) heart failure: Secondary | ICD-10-CM | POA: Diagnosis not present

## 2024-03-09 DIAGNOSIS — R600 Localized edema: Secondary | ICD-10-CM | POA: Diagnosis not present

## 2024-03-09 NOTE — Telephone Encounter (Signed)
 [facility] - CLIENT COORDINATION NOTE REPORT 03/09/24

## 2024-03-09 NOTE — Telephone Encounter (Signed)
 Gerilynn PT with [facility] called to report patient has a low grade temp of 99.8, right lung sounds clear but left lung sounded abnormal. Patient already on schedule for Wednesday and denied earlier appt.

## 2024-03-09 NOTE — Telephone Encounter (Signed)
 Shawn Osborn with [facility] stated patient currently rent O2 tanks from them all he needs to do is call them. She did state that our office is the "third person" to call them regarding O2 tanks for patient, she has received a order from [facility] and is going to reach out to patient herself to see what he needs.

## 2024-03-10 DIAGNOSIS — I110 Hypertensive heart disease with heart failure: Secondary | ICD-10-CM | POA: Diagnosis not present

## 2024-03-10 DIAGNOSIS — I5022 Chronic systolic (congestive) heart failure: Secondary | ICD-10-CM | POA: Diagnosis not present

## 2024-03-10 DIAGNOSIS — U071 COVID-19: Secondary | ICD-10-CM | POA: Diagnosis not present

## 2024-03-10 DIAGNOSIS — Z6839 Body mass index (BMI) 39.0-39.9, adult: Secondary | ICD-10-CM | POA: Diagnosis not present

## 2024-03-10 DIAGNOSIS — F03918 Unspecified dementia, unspecified severity, with other behavioral disturbance: Secondary | ICD-10-CM | POA: Diagnosis not present

## 2024-03-10 DIAGNOSIS — I4891 Unspecified atrial fibrillation: Secondary | ICD-10-CM | POA: Diagnosis not present

## 2024-03-10 DIAGNOSIS — J1282 Pneumonia due to coronavirus disease 2019: Secondary | ICD-10-CM | POA: Diagnosis not present

## 2024-03-10 DIAGNOSIS — I5033 Acute on chronic diastolic (congestive) heart failure: Secondary | ICD-10-CM | POA: Diagnosis not present

## 2024-03-10 DIAGNOSIS — F0393 Unspecified dementia, unspecified severity, with mood disturbance: Secondary | ICD-10-CM | POA: Diagnosis not present

## 2024-03-10 DIAGNOSIS — J439 Emphysema, unspecified: Secondary | ICD-10-CM | POA: Diagnosis not present

## 2024-03-10 DIAGNOSIS — J441 Chronic obstructive pulmonary disease with (acute) exacerbation: Secondary | ICD-10-CM | POA: Diagnosis not present

## 2024-03-10 DIAGNOSIS — J440 Chronic obstructive pulmonary disease with acute lower respiratory infection: Secondary | ICD-10-CM | POA: Diagnosis not present

## 2024-03-10 DIAGNOSIS — J9621 Acute and chronic respiratory failure with hypoxia: Secondary | ICD-10-CM | POA: Diagnosis not present

## 2024-03-10 DIAGNOSIS — F32A Depression, unspecified: Secondary | ICD-10-CM | POA: Diagnosis not present

## 2024-03-10 DIAGNOSIS — G4733 Obstructive sleep apnea (adult) (pediatric): Secondary | ICD-10-CM | POA: Diagnosis not present

## 2024-03-11 DIAGNOSIS — J9621 Acute and chronic respiratory failure with hypoxia: Secondary | ICD-10-CM | POA: Diagnosis not present

## 2024-03-11 DIAGNOSIS — J441 Chronic obstructive pulmonary disease with (acute) exacerbation: Secondary | ICD-10-CM | POA: Diagnosis not present

## 2024-03-11 DIAGNOSIS — F0393 Unspecified dementia, unspecified severity, with mood disturbance: Secondary | ICD-10-CM | POA: Diagnosis not present

## 2024-03-11 DIAGNOSIS — I4891 Unspecified atrial fibrillation: Secondary | ICD-10-CM | POA: Diagnosis not present

## 2024-03-11 DIAGNOSIS — J440 Chronic obstructive pulmonary disease with acute lower respiratory infection: Secondary | ICD-10-CM | POA: Diagnosis not present

## 2024-03-11 DIAGNOSIS — F03918 Unspecified dementia, unspecified severity, with other behavioral disturbance: Secondary | ICD-10-CM | POA: Diagnosis not present

## 2024-03-11 DIAGNOSIS — I5022 Chronic systolic (congestive) heart failure: Secondary | ICD-10-CM | POA: Diagnosis not present

## 2024-03-11 DIAGNOSIS — G4733 Obstructive sleep apnea (adult) (pediatric): Secondary | ICD-10-CM | POA: Diagnosis not present

## 2024-03-11 DIAGNOSIS — F32A Depression, unspecified: Secondary | ICD-10-CM | POA: Diagnosis not present

## 2024-03-11 DIAGNOSIS — I110 Hypertensive heart disease with heart failure: Secondary | ICD-10-CM | POA: Diagnosis not present

## 2024-03-11 DIAGNOSIS — J1282 Pneumonia due to coronavirus disease 2019: Secondary | ICD-10-CM | POA: Diagnosis not present

## 2024-03-11 DIAGNOSIS — J439 Emphysema, unspecified: Secondary | ICD-10-CM | POA: Diagnosis not present

## 2024-03-11 DIAGNOSIS — Z6839 Body mass index (BMI) 39.0-39.9, adult: Secondary | ICD-10-CM | POA: Diagnosis not present

## 2024-03-11 DIAGNOSIS — I5033 Acute on chronic diastolic (congestive) heart failure: Secondary | ICD-10-CM | POA: Diagnosis not present

## 2024-03-11 DIAGNOSIS — U071 COVID-19: Secondary | ICD-10-CM | POA: Diagnosis not present

## 2024-03-12 ENCOUNTER — Ambulatory Visit (INDEPENDENT_AMBULATORY_CARE_PROVIDER_SITE_OTHER)

## 2024-03-12 DIAGNOSIS — Z6839 Body mass index (BMI) 39.0-39.9, adult: Secondary | ICD-10-CM | POA: Diagnosis not present

## 2024-03-12 DIAGNOSIS — G300 Alzheimer's disease with early onset: Secondary | ICD-10-CM | POA: Diagnosis not present

## 2024-03-12 DIAGNOSIS — F32A Depression, unspecified: Secondary | ICD-10-CM | POA: Diagnosis not present

## 2024-03-12 DIAGNOSIS — I4891 Unspecified atrial fibrillation: Secondary | ICD-10-CM | POA: Diagnosis not present

## 2024-03-12 DIAGNOSIS — F02B3 Dementia in other diseases classified elsewhere, moderate, with mood disturbance: Secondary | ICD-10-CM

## 2024-03-12 DIAGNOSIS — I110 Hypertensive heart disease with heart failure: Secondary | ICD-10-CM

## 2024-03-12 DIAGNOSIS — J9611 Chronic respiratory failure with hypoxia: Secondary | ICD-10-CM

## 2024-03-12 DIAGNOSIS — I5033 Acute on chronic diastolic (congestive) heart failure: Secondary | ICD-10-CM | POA: Diagnosis not present

## 2024-03-12 DIAGNOSIS — J441 Chronic obstructive pulmonary disease with (acute) exacerbation: Secondary | ICD-10-CM | POA: Diagnosis not present

## 2024-03-12 DIAGNOSIS — I4811 Longstanding persistent atrial fibrillation: Secondary | ICD-10-CM

## 2024-03-12 DIAGNOSIS — J1282 Pneumonia due to coronavirus disease 2019: Secondary | ICD-10-CM | POA: Diagnosis not present

## 2024-03-12 DIAGNOSIS — U071 COVID-19: Secondary | ICD-10-CM | POA: Diagnosis not present

## 2024-03-12 DIAGNOSIS — J440 Chronic obstructive pulmonary disease with acute lower respiratory infection: Secondary | ICD-10-CM | POA: Diagnosis not present

## 2024-03-12 DIAGNOSIS — I5022 Chronic systolic (congestive) heart failure: Secondary | ICD-10-CM | POA: Diagnosis not present

## 2024-03-12 DIAGNOSIS — J439 Emphysema, unspecified: Secondary | ICD-10-CM | POA: Diagnosis not present

## 2024-03-12 DIAGNOSIS — J9621 Acute and chronic respiratory failure with hypoxia: Secondary | ICD-10-CM | POA: Diagnosis not present

## 2024-03-12 DIAGNOSIS — F03918 Unspecified dementia, unspecified severity, with other behavioral disturbance: Secondary | ICD-10-CM | POA: Diagnosis not present

## 2024-03-12 DIAGNOSIS — G4733 Obstructive sleep apnea (adult) (pediatric): Secondary | ICD-10-CM | POA: Diagnosis not present

## 2024-03-12 DIAGNOSIS — I5042 Chronic combined systolic (congestive) and diastolic (congestive) heart failure: Secondary | ICD-10-CM

## 2024-03-12 DIAGNOSIS — F0393 Unspecified dementia, unspecified severity, with mood disturbance: Secondary | ICD-10-CM | POA: Diagnosis not present

## 2024-03-12 DIAGNOSIS — J449 Chronic obstructive pulmonary disease, unspecified: Secondary | ICD-10-CM | POA: Insufficient documentation

## 2024-03-12 NOTE — Patient Instructions (Signed)
 VISIT SUMMARY:  You had a follow-up appointment today after your recent hospitalization for COVID-19 and a COPD flare-up. You were hospitalized from April 11 to April 15 and have since been feeling better with no chest pain or trouble breathing. You are currently using oxygen  at night and taking several medications for your various health conditions.  YOUR PLAN:  -CHRONIC OBSTRUCTIVE PULMONARY DISEASE (COPD) WITH ACUTE EXACERBATION: COPD is a chronic lung disease that makes it hard to breathe. You recently had a flare-up that required hospitalization. Continue using oxygen  at night at a rate of 3 liters and engage in physical therapy to improve your mobility.  -ACUTE RESPIRATORY FAILURE: This is a condition where your lungs can't provide enough oxygen  to your body. It was caused by your COPD flare-up and COVID-19 infection. Your condition has improved with treatment.  -COVID-19: You had a COVID-19 infection during your recent hospitalization. Your condition has improved with treatment.  -HEART FAILURE: Heart failure means your heart isn't pumping blood as well as it should. You are taking torsemide to manage fluid retention. We need to verify your torsemide dosage and adjust it if necessary. Elevate your legs to reduce swelling and reduce your sodium intake.  -LYMPHEDEMA: Lymphedema is swelling due to fluid buildup, often because of immobility. Elevate your legs and engage in physical therapy to reduce the swelling.  -ATRIAL FIBRILLATION: Atrial fibrillation is an irregular heartbeat that can lead to blood clots. You are taking Eliquis  to prevent stroke. Continue taking Eliquis  5 mg twice daily.  -DEMENTIA: Dementia is a decline in cognitive function. You are in the early stages and are taking Aricept  and Namenda  to help delay further decline. Continue taking Aricept  10 mg daily and Namenda  28 mg daily.  -RESTLESS LEGS SYNDROME: This condition causes an uncontrollable urge to move your legs. You  are taking Requip  to manage this. Continue taking Requip  as prescribed.  INSTRUCTIONS:  Follow up with Doctor Cox in one month for reassessment and potential blood work.

## 2024-03-12 NOTE — Progress Notes (Signed)
 Subjective:  Patient ID: Shawn Osborn, male    DOB: 1951/07/08  Age: 73 y.o. MRN: [id number]  Chief Complaint  Patient presents with   Hospitalization Follow-up    HPI:  Discussed the use of AI scribe software for clinical note transcription with the patient, who gave verbal consent to proceed.  History of Present Illness   Shawn Osborn "Shawn Osborn" is a 74 year old male with COPD and atrial fibrillation who presents for follow-up after recent hospitalization for COVID-19 and COPD exacerbation. he is brought in by his daughter. patient is in a wheelchair and has not been walking or ambulating for a long time due to knee pains and then deconditioning.   He was hospitalized at [facility] from April 11 to April 15 due to worsening breathing, a flare-up of COPD, and respiratory failure, compounded by a COVID-19 infection. He was treated and discharged on April 15. Since discharge, he feels better with no current chest pain or trouble breathing. He uses oxygen  at night at a rate of three liters.  During his hospital stay, he was prescribed furosemide  60 mg for 30 days, which was later switched to torsemide. He is currently taking six tablets of torsemide daily, which he believes are 30 mg each. Previously, he was on furosemide  40 mg twice daily. He has a history of atrial fibrillation and is on Eliquis  5 mg twice daily for stroke prevention.  He also takes Aricept  10 mg daily and Namenda  28 mg daily for memory issues, although he reports no significant memory problems. He experiences generalized body pain and has not been walking since December, primarily due to weakness and a fear of falling. He can take a few steps to get into bed or his truck with assistance.  He has a history of knee and shoulder issues, requiring replacements, but has not undergone surgery due to health risks. He receives home physical therapy to address his weakness. He takes Seroquel  50 mg and Rozerem  8 mg at  bedtime for sleep, and Requip  for restless legs. No bowel problems and maintains a good appetite, eating healthily.          03/12/2024    3:22 PM 09/09/2023    1:50 PM 06/19/2023    2:06 PM 03/27/2023   11:25 AM 10/30/2022    4:14 PM  Depression screen PHQ 2/9  Decreased Interest 0 0 0 0 0  Down, Depressed, Hopeless 0 0 0 0 0  PHQ - 2 Score 0 0 0 0 0  Altered sleeping    0   Tired, decreased energy    2   Change in appetite    0   Feeling bad or failure about yourself     0   Trouble concentrating    0   Moving slowly or fidgety/restless    0   Suicidal thoughts    0   PHQ-9 Score    2   Difficult doing work/chores    Not difficult at all         03/12/2024    3:22 PM  Fall Risk   Falls in the past year? 0  Number falls in past yr: 0  Injury with Fall? 0  Risk for fall due to : No Fall Risks    Patient Care Team: Mercy Stall, MD as PCP - General (Family Medicine) Tobb, Kardie, DO as PCP - Cardiology (Cardiology)   Review of Systems  Constitutional:  Negative for chills, fatigue and  fever.  HENT:  Negative for congestion, ear pain, sinus pressure and sore throat.   Respiratory:  Negative for cough and shortness of breath.   Cardiovascular:  Negative for chest pain.  Gastrointestinal:  Negative for abdominal pain, constipation, diarrhea, nausea and vomiting.  Genitourinary:  Negative for dysuria and frequency.  Musculoskeletal:  Negative for arthralgias, back pain and myalgias.  Neurological:  Negative for dizziness and headaches.  Psychiatric/Behavioral:  Negative for dysphoric mood. The patient is not nervous/anxious.     Current Outpatient Medications on File Prior to Visit  Medication Sig Dispense Refill   albuterol  (PROVENTIL ) (2.5 MG/3ML) 0.083% nebulizer solution Take 3 mLs (2.5 mg total) by nebulization every 6 (six) hours as needed for wheezing or shortness of breath. 360 mL 12   albuterol  (VENTOLIN  HFA) 108 (90 Base) MCG/ACT inhaler Inhale 2 puffs into the  lungs every 6 (six) hours as needed for wheezing or shortness of breath. 6.7 g 12   allopurinol  (ZYLOPRIM ) 300 MG tablet TAKE ONE TABLET BY MOUTH EVERY DAY at 1pm 30 tablet 3   apixaban  (ELIQUIS ) 5 MG TABS tablet Take 1 tablet (5 mg total) by mouth 2 (two) times daily. 60 tablet 0   donepezil  (ARICEPT ) 10 MG tablet TAKE ONE TABLET BY MOUTH EVERY EVENING 90 tablet 3   ipratropium-albuterol  (DUONEB) 0.5-2.5 (3) MG/3ML SOLN Take 3 mLs by nebulization every 4 (four) hours as needed. (Patient taking differently: Take 3 mLs by nebulization every 4 (four) hours as needed (SOB).) 360 mL 2   memantine  (NAMENDA  XR) 28 MG CP24 24 hr capsule TAKE ONE CAPSULE BY MOUTH AT BEDTIME 90 capsule 1   omega-3 acid ethyl esters (LOVAZA ) 1 g capsule TAKE 2 CAPSULES BY MOUTH TWICE DAILY 360 capsule 1   omeprazole  (PRILOSEC) 40 MG capsule TAKE ONE CAPSULE BY MOUTH EVERY DAY 30 capsule 1   potassium chloride  SA (KLOR-CON  M) 20 MEQ tablet Take 1 tablet (20 mEq total) by mouth daily. 30 tablet 0   pregabalin  (LYRICA ) 75 MG capsule TAKE ONE CAPSULE BY MOUTH EVERY DAY and TAKE ONE CAPSULE BY MOUTH EVERY DAY at 1pm and TAKE ONE CAPSULE BY MOUTH AT BEDTIME 90 capsule 2   propranolol  ER (INDERAL  LA) 60 MG 24 hr capsule TAKE ONE CAPSULE BY MOUTH EVERY DAY 30 capsule 5   QUEtiapine  (SEROQUEL ) 50 MG tablet TAKE ONE TABLET BY MOUTH AT BEDTIME 30 tablet 11   ramelteon  (ROZEREM ) 8 MG tablet TAKE ONE TABLET BY MOUTH AT BEDTIME 30 tablet 11   rOPINIRole  (REQUIP ) 0.5 MG tablet TAKE ONE TABLET BY MOUTH AT BEDTIME 30 tablet 2   rosuvastatin  (CRESTOR ) 10 MG tablet Take 1 tablet (10 mg total) by mouth daily. 90 tablet 1   sertraline  (ZOLOFT ) 100 MG tablet TAKE ONE TABLET BY MOUTH AT BEDTIME 30 tablet 2   tamsulosin  (FLOMAX ) 0.4 MG CAPS capsule TAKE 2 CAPSULES BY MOUTH EVERY EVENING 60 capsule 5   torsemide (DEMADEX) 20 MG tablet Take 90 mg by mouth daily.     TRELEGY ELLIPTA  100-62.5-25 MCG/ACT AEPB Inhale 1 puff into the lungs daily.      Vitamin D , Ergocalciferol , (DRISDOL ) 1.25 MG (50000 UNIT) CAPS capsule TAKE ONE CAPSULE BY MOUTH EVERY WEEK 15 capsule 3   No current facility-administered medications on file prior to visit.   Past Medical History:  Diagnosis Date   Acute combined systolic and diastolic CHF, NYHA class 3 (HCC) 10/08/2019   Acute delirium 10/08/2019   Acute exacerbation of CHF (congestive heart  failure) (HCC) 10/08/2019   Acute gout of multiple sites 07/01/2016   Acute respiratory failure with hypoxia and hypercapnia (HCC) 10/08/2019   Allergy    Anxiety    Arthritis    Gout, osteoarthritis in Back and Knees   Auditory hallucinations 05/01/2022   Cataract    CHF (congestive heart failure) (HCC)    Chronic bronchitis    Community acquired pneumonia 10/08/2019   COPD (chronic obstructive pulmonary disease) (HCC) 10/08/2019   Dementia (HCC) 10/08/2019   Elevated troponin 10/08/2019   Emphysema of lung (HCC)    Essential hypertension 07/03/2016   GERD (gastroesophageal reflux disease)    History of kidney stones    HLD (hyperlipidemia) 10/08/2019   Hypercapnic respiratory failure (HCC) 10/08/2019   Hyperlipidemia    Hypertension    Hypertensive heart disease 10/08/2019   Morbid obesity due to excess calories (HCC) 10/08/2019   Narcotic overdose (HCC) 10/08/2019   Neuropathy    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   NSTEMI (non-ST elevated myocardial infarction) (HCC) 10/08/2019   Obstructive sleep apnea    Occlusion and stenosis of carotid artery without mention of cerebral infarction 12/13/2011   OSA on CPAP 10/08/2019   Pain in joint of left shoulder 01/12/2019   Paranoia (psychosis) (HCC) 07/01/2016   Poorly-controlled hypertension 10/08/2019   Prediabetes    Psychotic disorder with delusions (HCC) 07/01/2016   Respiratory failure with hypoxia and hypercapnia (HCC) 10/08/2019   Restless leg syndrome 07/03/2016   Rheumatoid arthritis(714.0)    RLS (restless legs syndrome)    Sepsis  (HCC) 10/08/2019   Sleep apnea    Spinal stenosis    Syncope 04/03/2019   Tear of left rotator cuff 01/12/2019   Toxic metabolic encephalopathy 10/08/2019   UTI (urinary tract infection) 10/08/2019   Vascular dementia with behavioral disturbance (HCC) 07/06/2016   Visual hallucinations 05/01/2022   Past Surgical History:  Procedure Laterality Date   AMPUTATION TOE Left 01/01/2023   APPENDECTOMY     CARPAL TUNNEL RELEASE     CORONARY PRESSURE/FFR STUDY N/A 10/20/2019   Procedure: INTRAVASCULAR PRESSURE WIRE/FFR STUDY;  Surgeon: Arty Binning, MD;  Location: [facility];  Service: Cardiovascular;  Laterality: N/A;   EYE SURGERY     LEFT HEART CATH AND CORONARY ANGIOGRAPHY N/A 10/20/2019   Procedure: LEFT HEART CATH AND CORONARY ANGIOGRAPHY;  Surgeon: Arty Binning, MD;  Location: [facility];  Service: Cardiovascular;  Laterality: N/A;   SPINE SURGERY  1998, 2003, 2012   Laminectomy X 3    Family History  Problem Relation Age of Onset   Heart disease Mother    Hypertension Mother    Social History   Socioeconomic History   Marital status: Widowed    Spouse name: Not on file   Number of children: 4   Years of education: Not on file   Highest education level: 7th grade  Occupational History   Not on file  Tobacco Use   Smoking status: Former    Current packs/day: 0.75    Average packs/day: 0.8 packs/day for 58.3 years (43.7 ttl pk-yrs)    Types: Cigarettes    Start date: 3   Smokeless tobacco: Current    Types: Chew  Vaping Use   Vaping status: Never Used  Substance and Sexual Activity   Alcohol use: No   Drug use: No   Sexual activity: Not Currently  Other Topics Concern   Not on file  Social History Narrative  Not on file   Social Drivers of Health   Financial Resource Strain: Low Risk  (06/19/2023)   Overall Financial Resource Strain (CARDIA)    Difficulty of Paying Living Expenses: Not very hard  Food Insecurity: No Food Insecurity  (06/19/2023)   Hunger Vital Sign    Worried About Running Out of Food in the Last Year: Never true    Ran Out of Food in the Last Year: Never true  Transportation Needs: No Transportation Needs (06/19/2023)   PRAPARE - Administrator, Civil Service (Medical): No    Lack of Transportation (Non-Medical): No  Physical Activity: Inactive (06/19/2023)   Exercise Vital Sign    Days of Exercise per Week: 0 days    Minutes of Exercise per Session: 0 min  Stress: No Stress Concern Present (06/19/2023)   [employer] of Occupational Health - Occupational Stress Questionnaire    Feeling of Stress : Not at all  Social Connections: Moderately Isolated (11/19/2023)   Social Connection and Isolation Panel [NHANES]    Frequency of Communication with Friends and Family: Three times a week    Frequency of Social Gatherings with Friends and Family: Three times a week    Attends Religious Services: More than 4 times per year    Active Member of Clubs or Organizations: No    Attends Banker Meetings: Never    Marital Status: Widowed    Objective:  BP 122/88   Pulse 67   Temp 97.8 F (36.6 C)   Ht 5\' 10"  (1.778 m)   SpO2 96%   BMI 34.87 kg/m      03/12/2024    3:15 PM 11/19/2023   10:08 AM 09/09/2023    1:44 PM  BP/Weight  Systolic BP 122 128 110  Diastolic BP 88 64 60  Wt. (Lbs)  243 325  BMI  34.87 kg/m2 46.63 kg/m2    Physical Exam Vitals and nursing note reviewed.  Constitutional:      Appearance: He is obese.  HENT:     Head: Normocephalic and atraumatic.  Cardiovascular:     Rate and Rhythm: Normal rate and regular rhythm.  Pulmonary:     Effort: Pulmonary effort is normal.     Breath sounds: Normal breath sounds.  Musculoskeletal:        General: Swelling (bilateral leg swelling) present.  Neurological:     Mental Status: He is alert.     Diabetic Foot Exam - Simple   No data filed      Lab Results  Component Value Date   WBC 11.9 (H)  11/19/2023   HGB 13.3 11/19/2023   HCT 42.8 11/19/2023   PLT 272 11/19/2023   GLUCOSE 91 11/19/2023   CHOL 184 11/19/2023   TRIG 192 (H) 11/19/2023   HDL 45 11/19/2023   LDLCALC 106 (H) 11/19/2023   ALT 13 11/19/2023   AST 15 11/19/2023   NA 147 (H) 11/19/2023   K 5.2 11/19/2023   CL 107 (H) 11/19/2023   CREATININE 0.72 (L) 11/19/2023   BUN 11 11/19/2023   CO2 24 11/19/2023   TSH 1.850 05/22/2023   INR 0.9 08/30/2020   HGBA1C 5.7 (H) 09/09/2023      Assessment & Plan:  Morbidly obese (HCC)  Chronic respiratory failure with hypoxia (HCC) Assessment & Plan: Acute respiratory failure Acute respiratory failure secondary to COPD exacerbation and COVID-19 infection during recent hospitalization. Condition improved with treatment, discharged on April 15.   Hypertensive heart  disease with chronic combined systolic and diastolic congestive heart failure (HCC) Assessment & Plan: Heart failure Heart failure with fluid retention managed with torsemide. Recent hospitalization showed elevated heart failure markers, but condition improved with treatment. No current peripheral edema reported, but lymphedema noted due to immobility. Discussion about torsemide dosage revealed potential overuse, requiring verification and adjustment. - Verify torsemide dosage and adjust if necessary - Elevate legs to reduce swelling - Reduce sodium intake   Moderate early onset Alzheimer's dementia with mood disturbance (HCC) Assessment & Plan: Beginning stages of dementia. Currently on Aricept  10 mg daily and Namenda  28 mg daily. No significant memory issues reported. Medications continued to delay cognitive decline, based on tolerance and absence of adverse effects. - Continue Aricept  10 mg daily - Continue Namenda  28 mg daily   COPD exacerbation (HCC)  Longstanding persistent atrial fibrillation (HCC) Assessment & Plan: Chronic atrial fibrillation managed with Eliquis  5 mg twice daily to prevent  stroke. Confirmed adherence to anticoagulation therapy. - Continue Eliquis  5 mg twice daily     Assessment and Plan    Chronic obstructive pulmonary disease with acute exacerbation Recent hospitalization from April 11 to April 15 due to worsening breathing and acute exacerbation of COPD. Currently, no chest pain or dyspnea reported. Uses nocturnal oxygen  at 3 liters. - Continue nocturnal oxygen  at 3 liters - Engage in physical therapy to improve mobility  Acute respiratory failure Acute respiratory failure secondary to COPD exacerbation and COVID-19 infection during recent hospitalization. Condition improved with treatment, discharged on April 15.  COVID-19 Recent COVID-19 infection during hospitalization. Condition improved with treatment, discharged on April 15.  Heart failure Heart failure with fluid retention managed with torsemide. Recent hospitalization showed elevated heart failure markers, but condition improved with treatment. No current peripheral edema reported, but lymphedema noted due to immobility. Discussion about torsemide dosage revealed potential overuse, requiring verification and adjustment. - Verify torsemide dosage and adjust if necessary - Elevate legs to reduce swelling - Reduce sodium intake  Lymphedema Lymphedema in legs due to immobility and wheelchair use. Swelling attributed to both fluid retention and lifestyle. Emphasis on physical therapy to improve mobility and reduce swelling. - Elevate legs to reduce swelling - Engage in physical therapy to improve mobility  Atrial fibrillation Chronic atrial fibrillation managed with Eliquis  5 mg twice daily to prevent stroke. Confirmed adherence to anticoagulation therapy. - Continue Eliquis  5 mg twice daily  Dementia Beginning stages of dementia. Currently on Aricept  10 mg daily and Namenda  28 mg daily. No significant memory issues reported. Medications continued to delay cognitive decline, based on tolerance  and absence of adverse effects. - Continue Aricept  10 mg daily - Continue Namenda  28 mg daily  Restless legs syndrome Restless legs syndrome managed with Requip . Reports inability to discontinue medication. - Continue Requip  as prescribed  General Health Maintenance Excellent kidney function with a GFR of 97. No immediate need for blood work as recent hospitalization provided sufficient data. - Follow up with Doctor Cox in one month for reassessment and potential blood work       No orders of the defined types were placed in this encounter.   No orders of the defined types were placed in this encounter.    Follow-up: Return in about 4 weeks (around 04/09/2024) for chronic disease follow up.     An After Visit Summary was printed and given to the patient.  Joaquin Knebel, MD [facility] [phone]

## 2024-03-13 ENCOUNTER — Other Ambulatory Visit: Payer: Self-pay | Admitting: Family Medicine

## 2024-03-15 DIAGNOSIS — J9622 Acute and chronic respiratory failure with hypercapnia: Secondary | ICD-10-CM | POA: Diagnosis not present

## 2024-03-15 DIAGNOSIS — Z6841 Body Mass Index (BMI) 40.0 and over, adult: Secondary | ICD-10-CM | POA: Diagnosis not present

## 2024-03-16 ENCOUNTER — Telehealth: Payer: Self-pay

## 2024-03-16 ENCOUNTER — Telehealth: Payer: Self-pay | Admitting: Family Medicine

## 2024-03-16 DIAGNOSIS — I4891 Unspecified atrial fibrillation: Secondary | ICD-10-CM

## 2024-03-16 DIAGNOSIS — U071 COVID-19: Secondary | ICD-10-CM | POA: Diagnosis not present

## 2024-03-16 DIAGNOSIS — I5033 Acute on chronic diastolic (congestive) heart failure: Secondary | ICD-10-CM

## 2024-03-16 DIAGNOSIS — Z6839 Body mass index (BMI) 39.0-39.9, adult: Secondary | ICD-10-CM

## 2024-03-16 DIAGNOSIS — J1282 Pneumonia due to coronavirus disease 2019: Secondary | ICD-10-CM | POA: Diagnosis not present

## 2024-03-16 DIAGNOSIS — J9621 Acute and chronic respiratory failure with hypoxia: Secondary | ICD-10-CM

## 2024-03-16 DIAGNOSIS — J439 Emphysema, unspecified: Secondary | ICD-10-CM

## 2024-03-16 DIAGNOSIS — J440 Chronic obstructive pulmonary disease with acute lower respiratory infection: Secondary | ICD-10-CM | POA: Diagnosis not present

## 2024-03-16 DIAGNOSIS — I5022 Chronic systolic (congestive) heart failure: Secondary | ICD-10-CM

## 2024-03-16 DIAGNOSIS — I110 Hypertensive heart disease with heart failure: Secondary | ICD-10-CM

## 2024-03-16 DIAGNOSIS — J441 Chronic obstructive pulmonary disease with (acute) exacerbation: Secondary | ICD-10-CM | POA: Diagnosis not present

## 2024-03-16 NOTE — Telephone Encounter (Signed)
 [facility] [phone]

## 2024-03-16 NOTE — Assessment & Plan Note (Signed)
 Acute respiratory failure Acute respiratory failure secondary to COPD exacerbation and COVID-19 infection during recent hospitalization. Condition improved with treatment, discharged on April 15.

## 2024-03-16 NOTE — Telephone Encounter (Signed)
 Daughter informed and verbalized understanding

## 2024-03-16 NOTE — Assessment & Plan Note (Signed)
 Chronic atrial fibrillation managed with Eliquis  5 mg twice daily to prevent stroke. Confirmed adherence to anticoagulation therapy. - Continue Eliquis  5 mg twice daily

## 2024-03-16 NOTE — Assessment & Plan Note (Signed)
 Beginning stages of dementia. Currently on Aricept  10 mg daily and Namenda  28 mg daily. No significant memory issues reported. Medications continued to delay cognitive decline, based on tolerance and absence of adverse effects. - Continue Aricept  10 mg daily - Continue Namenda  28 mg daily

## 2024-03-16 NOTE — Telephone Encounter (Signed)
 Daughter called back stating torsemide is 20mg . Patient was told to take 6 pills by a Nurse Practitioner. Informed daughter that we will call her back with dose and directions.

## 2024-03-16 NOTE — Assessment & Plan Note (Signed)
 Heart failure Heart failure with fluid retention managed with torsemide. Recent hospitalization showed elevated heart failure markers, but condition improved with treatment. No current peripheral edema reported, but lymphedema noted due to immobility. Discussion about torsemide dosage revealed potential overuse, requiring verification and adjustment. - Verify torsemide dosage and adjust if necessary - Elevate legs to reduce swelling - Reduce sodium intake

## 2024-03-19 DIAGNOSIS — D649 Anemia, unspecified: Secondary | ICD-10-CM | POA: Diagnosis not present

## 2024-03-19 DIAGNOSIS — Z6839 Body mass index (BMI) 39.0-39.9, adult: Secondary | ICD-10-CM | POA: Diagnosis not present

## 2024-03-19 DIAGNOSIS — G4733 Obstructive sleep apnea (adult) (pediatric): Secondary | ICD-10-CM | POA: Diagnosis not present

## 2024-03-19 DIAGNOSIS — F03918 Unspecified dementia, unspecified severity, with other behavioral disturbance: Secondary | ICD-10-CM | POA: Diagnosis not present

## 2024-03-19 DIAGNOSIS — J1282 Pneumonia due to coronavirus disease 2019: Secondary | ICD-10-CM | POA: Diagnosis not present

## 2024-03-19 DIAGNOSIS — F32A Depression, unspecified: Secondary | ICD-10-CM | POA: Diagnosis not present

## 2024-03-19 DIAGNOSIS — I110 Hypertensive heart disease with heart failure: Secondary | ICD-10-CM | POA: Diagnosis not present

## 2024-03-19 DIAGNOSIS — F0393 Unspecified dementia, unspecified severity, with mood disturbance: Secondary | ICD-10-CM | POA: Diagnosis not present

## 2024-03-19 DIAGNOSIS — J440 Chronic obstructive pulmonary disease with acute lower respiratory infection: Secondary | ICD-10-CM | POA: Diagnosis not present

## 2024-03-19 DIAGNOSIS — J439 Emphysema, unspecified: Secondary | ICD-10-CM | POA: Diagnosis not present

## 2024-03-19 DIAGNOSIS — F17210 Nicotine dependence, cigarettes, uncomplicated: Secondary | ICD-10-CM | POA: Diagnosis not present

## 2024-03-19 DIAGNOSIS — G2581 Restless legs syndrome: Secondary | ICD-10-CM | POA: Diagnosis not present

## 2024-03-19 DIAGNOSIS — J441 Chronic obstructive pulmonary disease with (acute) exacerbation: Secondary | ICD-10-CM | POA: Diagnosis not present

## 2024-03-19 DIAGNOSIS — Z9981 Dependence on supplemental oxygen: Secondary | ICD-10-CM | POA: Diagnosis not present

## 2024-03-19 DIAGNOSIS — I4891 Unspecified atrial fibrillation: Secondary | ICD-10-CM | POA: Diagnosis not present

## 2024-03-19 DIAGNOSIS — J9621 Acute and chronic respiratory failure with hypoxia: Secondary | ICD-10-CM | POA: Diagnosis not present

## 2024-03-19 DIAGNOSIS — E785 Hyperlipidemia, unspecified: Secondary | ICD-10-CM | POA: Diagnosis not present

## 2024-03-19 DIAGNOSIS — I5033 Acute on chronic diastolic (congestive) heart failure: Secondary | ICD-10-CM | POA: Diagnosis not present

## 2024-03-19 DIAGNOSIS — N401 Enlarged prostate with lower urinary tract symptoms: Secondary | ICD-10-CM | POA: Diagnosis not present

## 2024-03-19 DIAGNOSIS — U071 COVID-19: Secondary | ICD-10-CM | POA: Diagnosis not present

## 2024-03-19 DIAGNOSIS — R351 Nocturia: Secondary | ICD-10-CM | POA: Diagnosis not present

## 2024-03-19 DIAGNOSIS — I5022 Chronic systolic (congestive) heart failure: Secondary | ICD-10-CM | POA: Diagnosis not present

## 2024-03-19 DIAGNOSIS — Z7982 Long term (current) use of aspirin: Secondary | ICD-10-CM | POA: Diagnosis not present

## 2024-03-19 DIAGNOSIS — G629 Polyneuropathy, unspecified: Secondary | ICD-10-CM | POA: Diagnosis not present

## 2024-03-20 DIAGNOSIS — F17210 Nicotine dependence, cigarettes, uncomplicated: Secondary | ICD-10-CM | POA: Diagnosis not present

## 2024-03-20 DIAGNOSIS — J9621 Acute and chronic respiratory failure with hypoxia: Secondary | ICD-10-CM | POA: Diagnosis not present

## 2024-03-20 DIAGNOSIS — J440 Chronic obstructive pulmonary disease with acute lower respiratory infection: Secondary | ICD-10-CM | POA: Diagnosis not present

## 2024-03-20 DIAGNOSIS — J1282 Pneumonia due to coronavirus disease 2019: Secondary | ICD-10-CM | POA: Diagnosis not present

## 2024-03-20 DIAGNOSIS — N401 Enlarged prostate with lower urinary tract symptoms: Secondary | ICD-10-CM | POA: Diagnosis not present

## 2024-03-20 DIAGNOSIS — G629 Polyneuropathy, unspecified: Secondary | ICD-10-CM | POA: Diagnosis not present

## 2024-03-20 DIAGNOSIS — I5033 Acute on chronic diastolic (congestive) heart failure: Secondary | ICD-10-CM | POA: Diagnosis not present

## 2024-03-20 DIAGNOSIS — G4733 Obstructive sleep apnea (adult) (pediatric): Secondary | ICD-10-CM | POA: Diagnosis not present

## 2024-03-20 DIAGNOSIS — D649 Anemia, unspecified: Secondary | ICD-10-CM | POA: Diagnosis not present

## 2024-03-20 DIAGNOSIS — F0393 Unspecified dementia, unspecified severity, with mood disturbance: Secondary | ICD-10-CM | POA: Diagnosis not present

## 2024-03-20 DIAGNOSIS — F32A Depression, unspecified: Secondary | ICD-10-CM | POA: Diagnosis not present

## 2024-03-20 DIAGNOSIS — Z9981 Dependence on supplemental oxygen: Secondary | ICD-10-CM | POA: Diagnosis not present

## 2024-03-20 DIAGNOSIS — U071 COVID-19: Secondary | ICD-10-CM | POA: Diagnosis not present

## 2024-03-20 DIAGNOSIS — I110 Hypertensive heart disease with heart failure: Secondary | ICD-10-CM | POA: Diagnosis not present

## 2024-03-20 DIAGNOSIS — I5022 Chronic systolic (congestive) heart failure: Secondary | ICD-10-CM | POA: Diagnosis not present

## 2024-03-20 DIAGNOSIS — J441 Chronic obstructive pulmonary disease with (acute) exacerbation: Secondary | ICD-10-CM | POA: Diagnosis not present

## 2024-03-20 DIAGNOSIS — E785 Hyperlipidemia, unspecified: Secondary | ICD-10-CM | POA: Diagnosis not present

## 2024-03-20 DIAGNOSIS — Z6839 Body mass index (BMI) 39.0-39.9, adult: Secondary | ICD-10-CM | POA: Diagnosis not present

## 2024-03-20 DIAGNOSIS — G2581 Restless legs syndrome: Secondary | ICD-10-CM | POA: Diagnosis not present

## 2024-03-20 DIAGNOSIS — I4891 Unspecified atrial fibrillation: Secondary | ICD-10-CM | POA: Diagnosis not present

## 2024-03-20 DIAGNOSIS — R351 Nocturia: Secondary | ICD-10-CM | POA: Diagnosis not present

## 2024-03-20 DIAGNOSIS — J439 Emphysema, unspecified: Secondary | ICD-10-CM | POA: Diagnosis not present

## 2024-03-20 DIAGNOSIS — F03918 Unspecified dementia, unspecified severity, with other behavioral disturbance: Secondary | ICD-10-CM | POA: Diagnosis not present

## 2024-03-20 DIAGNOSIS — Z7982 Long term (current) use of aspirin: Secondary | ICD-10-CM | POA: Diagnosis not present

## 2024-03-23 DIAGNOSIS — I5022 Chronic systolic (congestive) heart failure: Secondary | ICD-10-CM | POA: Diagnosis not present

## 2024-03-23 DIAGNOSIS — J449 Chronic obstructive pulmonary disease, unspecified: Secondary | ICD-10-CM | POA: Diagnosis not present

## 2024-03-24 DIAGNOSIS — J449 Chronic obstructive pulmonary disease, unspecified: Secondary | ICD-10-CM | POA: Diagnosis not present

## 2024-03-25 ENCOUNTER — Telehealth: Payer: Self-pay

## 2024-03-25 NOTE — Telephone Encounter (Signed)
 Contacted Shawn Osborn to schedule their annual wellness visit. Left message to call office back to schedule appointment for after 06/18/24.

## 2024-03-26 DIAGNOSIS — J449 Chronic obstructive pulmonary disease, unspecified: Secondary | ICD-10-CM | POA: Diagnosis not present

## 2024-03-27 DIAGNOSIS — Z9981 Dependence on supplemental oxygen: Secondary | ICD-10-CM | POA: Diagnosis not present

## 2024-03-27 DIAGNOSIS — I5022 Chronic systolic (congestive) heart failure: Secondary | ICD-10-CM | POA: Diagnosis not present

## 2024-03-27 DIAGNOSIS — I110 Hypertensive heart disease with heart failure: Secondary | ICD-10-CM | POA: Diagnosis not present

## 2024-03-27 DIAGNOSIS — F32A Depression, unspecified: Secondary | ICD-10-CM | POA: Diagnosis not present

## 2024-03-27 DIAGNOSIS — G4733 Obstructive sleep apnea (adult) (pediatric): Secondary | ICD-10-CM | POA: Diagnosis not present

## 2024-03-27 DIAGNOSIS — I5033 Acute on chronic diastolic (congestive) heart failure: Secondary | ICD-10-CM | POA: Diagnosis not present

## 2024-03-27 DIAGNOSIS — N401 Enlarged prostate with lower urinary tract symptoms: Secondary | ICD-10-CM | POA: Diagnosis not present

## 2024-03-27 DIAGNOSIS — G2581 Restless legs syndrome: Secondary | ICD-10-CM | POA: Diagnosis not present

## 2024-03-27 DIAGNOSIS — F0393 Unspecified dementia, unspecified severity, with mood disturbance: Secondary | ICD-10-CM | POA: Diagnosis not present

## 2024-03-27 DIAGNOSIS — F17210 Nicotine dependence, cigarettes, uncomplicated: Secondary | ICD-10-CM | POA: Diagnosis not present

## 2024-03-27 DIAGNOSIS — J440 Chronic obstructive pulmonary disease with acute lower respiratory infection: Secondary | ICD-10-CM | POA: Diagnosis not present

## 2024-03-27 DIAGNOSIS — U071 COVID-19: Secondary | ICD-10-CM | POA: Diagnosis not present

## 2024-03-27 DIAGNOSIS — J439 Emphysema, unspecified: Secondary | ICD-10-CM | POA: Diagnosis not present

## 2024-03-27 DIAGNOSIS — Z7982 Long term (current) use of aspirin: Secondary | ICD-10-CM | POA: Diagnosis not present

## 2024-03-27 DIAGNOSIS — G629 Polyneuropathy, unspecified: Secondary | ICD-10-CM | POA: Diagnosis not present

## 2024-03-27 DIAGNOSIS — Z6839 Body mass index (BMI) 39.0-39.9, adult: Secondary | ICD-10-CM | POA: Diagnosis not present

## 2024-03-27 DIAGNOSIS — D649 Anemia, unspecified: Secondary | ICD-10-CM | POA: Diagnosis not present

## 2024-03-27 DIAGNOSIS — F03918 Unspecified dementia, unspecified severity, with other behavioral disturbance: Secondary | ICD-10-CM | POA: Diagnosis not present

## 2024-03-27 DIAGNOSIS — J441 Chronic obstructive pulmonary disease with (acute) exacerbation: Secondary | ICD-10-CM | POA: Diagnosis not present

## 2024-03-27 DIAGNOSIS — J1282 Pneumonia due to coronavirus disease 2019: Secondary | ICD-10-CM | POA: Diagnosis not present

## 2024-03-27 DIAGNOSIS — R351 Nocturia: Secondary | ICD-10-CM | POA: Diagnosis not present

## 2024-03-27 DIAGNOSIS — I4891 Unspecified atrial fibrillation: Secondary | ICD-10-CM | POA: Diagnosis not present

## 2024-03-27 DIAGNOSIS — J9621 Acute and chronic respiratory failure with hypoxia: Secondary | ICD-10-CM | POA: Diagnosis not present

## 2024-03-27 DIAGNOSIS — E785 Hyperlipidemia, unspecified: Secondary | ICD-10-CM | POA: Diagnosis not present

## 2024-03-31 ENCOUNTER — Other Ambulatory Visit: Payer: Self-pay | Admitting: Family Medicine

## 2024-03-31 DIAGNOSIS — G2581 Restless legs syndrome: Secondary | ICD-10-CM

## 2024-04-01 DIAGNOSIS — E785 Hyperlipidemia, unspecified: Secondary | ICD-10-CM | POA: Diagnosis not present

## 2024-04-01 DIAGNOSIS — I5033 Acute on chronic diastolic (congestive) heart failure: Secondary | ICD-10-CM | POA: Diagnosis not present

## 2024-04-01 DIAGNOSIS — I4891 Unspecified atrial fibrillation: Secondary | ICD-10-CM | POA: Diagnosis not present

## 2024-04-01 DIAGNOSIS — F03918 Unspecified dementia, unspecified severity, with other behavioral disturbance: Secondary | ICD-10-CM | POA: Diagnosis not present

## 2024-04-01 DIAGNOSIS — J1282 Pneumonia due to coronavirus disease 2019: Secondary | ICD-10-CM | POA: Diagnosis not present

## 2024-04-01 DIAGNOSIS — I5022 Chronic systolic (congestive) heart failure: Secondary | ICD-10-CM | POA: Diagnosis not present

## 2024-04-01 DIAGNOSIS — U071 COVID-19: Secondary | ICD-10-CM | POA: Diagnosis not present

## 2024-04-01 DIAGNOSIS — Z7982 Long term (current) use of aspirin: Secondary | ICD-10-CM | POA: Diagnosis not present

## 2024-04-01 DIAGNOSIS — G4733 Obstructive sleep apnea (adult) (pediatric): Secondary | ICD-10-CM | POA: Diagnosis not present

## 2024-04-01 DIAGNOSIS — I110 Hypertensive heart disease with heart failure: Secondary | ICD-10-CM | POA: Diagnosis not present

## 2024-04-01 DIAGNOSIS — J439 Emphysema, unspecified: Secondary | ICD-10-CM | POA: Diagnosis not present

## 2024-04-01 DIAGNOSIS — R351 Nocturia: Secondary | ICD-10-CM | POA: Diagnosis not present

## 2024-04-01 DIAGNOSIS — G2581 Restless legs syndrome: Secondary | ICD-10-CM | POA: Diagnosis not present

## 2024-04-01 DIAGNOSIS — G629 Polyneuropathy, unspecified: Secondary | ICD-10-CM | POA: Diagnosis not present

## 2024-04-01 DIAGNOSIS — J440 Chronic obstructive pulmonary disease with acute lower respiratory infection: Secondary | ICD-10-CM | POA: Diagnosis not present

## 2024-04-01 DIAGNOSIS — F0393 Unspecified dementia, unspecified severity, with mood disturbance: Secondary | ICD-10-CM | POA: Diagnosis not present

## 2024-04-01 DIAGNOSIS — N401 Enlarged prostate with lower urinary tract symptoms: Secondary | ICD-10-CM | POA: Diagnosis not present

## 2024-04-01 DIAGNOSIS — J9621 Acute and chronic respiratory failure with hypoxia: Secondary | ICD-10-CM | POA: Diagnosis not present

## 2024-04-01 DIAGNOSIS — D649 Anemia, unspecified: Secondary | ICD-10-CM | POA: Diagnosis not present

## 2024-04-01 DIAGNOSIS — F32A Depression, unspecified: Secondary | ICD-10-CM | POA: Diagnosis not present

## 2024-04-01 DIAGNOSIS — Z6839 Body mass index (BMI) 39.0-39.9, adult: Secondary | ICD-10-CM | POA: Diagnosis not present

## 2024-04-01 DIAGNOSIS — F17210 Nicotine dependence, cigarettes, uncomplicated: Secondary | ICD-10-CM | POA: Diagnosis not present

## 2024-04-01 DIAGNOSIS — J441 Chronic obstructive pulmonary disease with (acute) exacerbation: Secondary | ICD-10-CM | POA: Diagnosis not present

## 2024-04-01 DIAGNOSIS — Z9981 Dependence on supplemental oxygen: Secondary | ICD-10-CM | POA: Diagnosis not present

## 2024-04-03 DIAGNOSIS — J440 Chronic obstructive pulmonary disease with acute lower respiratory infection: Secondary | ICD-10-CM | POA: Diagnosis not present

## 2024-04-03 DIAGNOSIS — G629 Polyneuropathy, unspecified: Secondary | ICD-10-CM | POA: Diagnosis not present

## 2024-04-03 DIAGNOSIS — Z7982 Long term (current) use of aspirin: Secondary | ICD-10-CM | POA: Diagnosis not present

## 2024-04-03 DIAGNOSIS — Z9981 Dependence on supplemental oxygen: Secondary | ICD-10-CM | POA: Diagnosis not present

## 2024-04-03 DIAGNOSIS — I4891 Unspecified atrial fibrillation: Secondary | ICD-10-CM | POA: Diagnosis not present

## 2024-04-03 DIAGNOSIS — D649 Anemia, unspecified: Secondary | ICD-10-CM | POA: Diagnosis not present

## 2024-04-03 DIAGNOSIS — J439 Emphysema, unspecified: Secondary | ICD-10-CM | POA: Diagnosis not present

## 2024-04-03 DIAGNOSIS — J9621 Acute and chronic respiratory failure with hypoxia: Secondary | ICD-10-CM | POA: Diagnosis not present

## 2024-04-03 DIAGNOSIS — I110 Hypertensive heart disease with heart failure: Secondary | ICD-10-CM | POA: Diagnosis not present

## 2024-04-03 DIAGNOSIS — F0393 Unspecified dementia, unspecified severity, with mood disturbance: Secondary | ICD-10-CM | POA: Diagnosis not present

## 2024-04-03 DIAGNOSIS — J1282 Pneumonia due to coronavirus disease 2019: Secondary | ICD-10-CM | POA: Diagnosis not present

## 2024-04-03 DIAGNOSIS — E785 Hyperlipidemia, unspecified: Secondary | ICD-10-CM | POA: Diagnosis not present

## 2024-04-03 DIAGNOSIS — G4733 Obstructive sleep apnea (adult) (pediatric): Secondary | ICD-10-CM | POA: Diagnosis not present

## 2024-04-03 DIAGNOSIS — N401 Enlarged prostate with lower urinary tract symptoms: Secondary | ICD-10-CM | POA: Diagnosis not present

## 2024-04-03 DIAGNOSIS — F17210 Nicotine dependence, cigarettes, uncomplicated: Secondary | ICD-10-CM | POA: Diagnosis not present

## 2024-04-03 DIAGNOSIS — G2581 Restless legs syndrome: Secondary | ICD-10-CM | POA: Diagnosis not present

## 2024-04-03 DIAGNOSIS — F32A Depression, unspecified: Secondary | ICD-10-CM | POA: Diagnosis not present

## 2024-04-03 DIAGNOSIS — I5033 Acute on chronic diastolic (congestive) heart failure: Secondary | ICD-10-CM | POA: Diagnosis not present

## 2024-04-03 DIAGNOSIS — I5022 Chronic systolic (congestive) heart failure: Secondary | ICD-10-CM | POA: Diagnosis not present

## 2024-04-03 DIAGNOSIS — Z6839 Body mass index (BMI) 39.0-39.9, adult: Secondary | ICD-10-CM | POA: Diagnosis not present

## 2024-04-03 DIAGNOSIS — R351 Nocturia: Secondary | ICD-10-CM | POA: Diagnosis not present

## 2024-04-03 DIAGNOSIS — J441 Chronic obstructive pulmonary disease with (acute) exacerbation: Secondary | ICD-10-CM | POA: Diagnosis not present

## 2024-04-03 DIAGNOSIS — F03918 Unspecified dementia, unspecified severity, with other behavioral disturbance: Secondary | ICD-10-CM | POA: Diagnosis not present

## 2024-04-03 DIAGNOSIS — U071 COVID-19: Secondary | ICD-10-CM | POA: Diagnosis not present

## 2024-04-04 DIAGNOSIS — Z6839 Body mass index (BMI) 39.0-39.9, adult: Secondary | ICD-10-CM | POA: Diagnosis not present

## 2024-04-04 DIAGNOSIS — Z9981 Dependence on supplemental oxygen: Secondary | ICD-10-CM | POA: Diagnosis not present

## 2024-04-04 DIAGNOSIS — E785 Hyperlipidemia, unspecified: Secondary | ICD-10-CM | POA: Diagnosis not present

## 2024-04-04 DIAGNOSIS — J9621 Acute and chronic respiratory failure with hypoxia: Secondary | ICD-10-CM | POA: Diagnosis not present

## 2024-04-04 DIAGNOSIS — I5033 Acute on chronic diastolic (congestive) heart failure: Secondary | ICD-10-CM | POA: Diagnosis not present

## 2024-04-04 DIAGNOSIS — G2581 Restless legs syndrome: Secondary | ICD-10-CM | POA: Diagnosis not present

## 2024-04-04 DIAGNOSIS — I5022 Chronic systolic (congestive) heart failure: Secondary | ICD-10-CM | POA: Diagnosis not present

## 2024-04-04 DIAGNOSIS — I4891 Unspecified atrial fibrillation: Secondary | ICD-10-CM | POA: Diagnosis not present

## 2024-04-04 DIAGNOSIS — Z7982 Long term (current) use of aspirin: Secondary | ICD-10-CM | POA: Diagnosis not present

## 2024-04-04 DIAGNOSIS — J439 Emphysema, unspecified: Secondary | ICD-10-CM | POA: Diagnosis not present

## 2024-04-04 DIAGNOSIS — J440 Chronic obstructive pulmonary disease with acute lower respiratory infection: Secondary | ICD-10-CM | POA: Diagnosis not present

## 2024-04-04 DIAGNOSIS — J441 Chronic obstructive pulmonary disease with (acute) exacerbation: Secondary | ICD-10-CM | POA: Diagnosis not present

## 2024-04-04 DIAGNOSIS — I110 Hypertensive heart disease with heart failure: Secondary | ICD-10-CM | POA: Diagnosis not present

## 2024-04-04 DIAGNOSIS — F17210 Nicotine dependence, cigarettes, uncomplicated: Secondary | ICD-10-CM | POA: Diagnosis not present

## 2024-04-04 DIAGNOSIS — D649 Anemia, unspecified: Secondary | ICD-10-CM | POA: Diagnosis not present

## 2024-04-04 DIAGNOSIS — F32A Depression, unspecified: Secondary | ICD-10-CM | POA: Diagnosis not present

## 2024-04-04 DIAGNOSIS — G4733 Obstructive sleep apnea (adult) (pediatric): Secondary | ICD-10-CM | POA: Diagnosis not present

## 2024-04-04 DIAGNOSIS — G629 Polyneuropathy, unspecified: Secondary | ICD-10-CM | POA: Diagnosis not present

## 2024-04-04 DIAGNOSIS — J1282 Pneumonia due to coronavirus disease 2019: Secondary | ICD-10-CM | POA: Diagnosis not present

## 2024-04-04 DIAGNOSIS — F0393 Unspecified dementia, unspecified severity, with mood disturbance: Secondary | ICD-10-CM | POA: Diagnosis not present

## 2024-04-04 DIAGNOSIS — R351 Nocturia: Secondary | ICD-10-CM | POA: Diagnosis not present

## 2024-04-04 DIAGNOSIS — N401 Enlarged prostate with lower urinary tract symptoms: Secondary | ICD-10-CM | POA: Diagnosis not present

## 2024-04-04 DIAGNOSIS — F03918 Unspecified dementia, unspecified severity, with other behavioral disturbance: Secondary | ICD-10-CM | POA: Diagnosis not present

## 2024-04-04 DIAGNOSIS — U071 COVID-19: Secondary | ICD-10-CM | POA: Diagnosis not present

## 2024-04-07 DIAGNOSIS — R351 Nocturia: Secondary | ICD-10-CM | POA: Diagnosis not present

## 2024-04-07 DIAGNOSIS — J1282 Pneumonia due to coronavirus disease 2019: Secondary | ICD-10-CM | POA: Diagnosis not present

## 2024-04-07 DIAGNOSIS — N401 Enlarged prostate with lower urinary tract symptoms: Secondary | ICD-10-CM | POA: Diagnosis not present

## 2024-04-07 DIAGNOSIS — G4733 Obstructive sleep apnea (adult) (pediatric): Secondary | ICD-10-CM | POA: Diagnosis not present

## 2024-04-07 DIAGNOSIS — J439 Emphysema, unspecified: Secondary | ICD-10-CM | POA: Diagnosis not present

## 2024-04-07 DIAGNOSIS — I110 Hypertensive heart disease with heart failure: Secondary | ICD-10-CM | POA: Diagnosis not present

## 2024-04-07 DIAGNOSIS — J441 Chronic obstructive pulmonary disease with (acute) exacerbation: Secondary | ICD-10-CM | POA: Diagnosis not present

## 2024-04-07 DIAGNOSIS — D649 Anemia, unspecified: Secondary | ICD-10-CM | POA: Diagnosis not present

## 2024-04-07 DIAGNOSIS — J440 Chronic obstructive pulmonary disease with acute lower respiratory infection: Secondary | ICD-10-CM | POA: Diagnosis not present

## 2024-04-07 DIAGNOSIS — F17210 Nicotine dependence, cigarettes, uncomplicated: Secondary | ICD-10-CM | POA: Diagnosis not present

## 2024-04-07 DIAGNOSIS — F0393 Unspecified dementia, unspecified severity, with mood disturbance: Secondary | ICD-10-CM | POA: Diagnosis not present

## 2024-04-07 DIAGNOSIS — E785 Hyperlipidemia, unspecified: Secondary | ICD-10-CM | POA: Diagnosis not present

## 2024-04-07 DIAGNOSIS — Z9981 Dependence on supplemental oxygen: Secondary | ICD-10-CM | POA: Diagnosis not present

## 2024-04-07 DIAGNOSIS — Z7982 Long term (current) use of aspirin: Secondary | ICD-10-CM | POA: Diagnosis not present

## 2024-04-07 DIAGNOSIS — Z6839 Body mass index (BMI) 39.0-39.9, adult: Secondary | ICD-10-CM | POA: Diagnosis not present

## 2024-04-07 DIAGNOSIS — I4891 Unspecified atrial fibrillation: Secondary | ICD-10-CM | POA: Diagnosis not present

## 2024-04-07 DIAGNOSIS — J9621 Acute and chronic respiratory failure with hypoxia: Secondary | ICD-10-CM | POA: Diagnosis not present

## 2024-04-07 DIAGNOSIS — F03918 Unspecified dementia, unspecified severity, with other behavioral disturbance: Secondary | ICD-10-CM | POA: Diagnosis not present

## 2024-04-07 DIAGNOSIS — I5033 Acute on chronic diastolic (congestive) heart failure: Secondary | ICD-10-CM | POA: Diagnosis not present

## 2024-04-07 DIAGNOSIS — U071 COVID-19: Secondary | ICD-10-CM | POA: Diagnosis not present

## 2024-04-07 DIAGNOSIS — F32A Depression, unspecified: Secondary | ICD-10-CM | POA: Diagnosis not present

## 2024-04-07 DIAGNOSIS — I5022 Chronic systolic (congestive) heart failure: Secondary | ICD-10-CM | POA: Diagnosis not present

## 2024-04-07 DIAGNOSIS — G629 Polyneuropathy, unspecified: Secondary | ICD-10-CM | POA: Diagnosis not present

## 2024-04-07 DIAGNOSIS — G2581 Restless legs syndrome: Secondary | ICD-10-CM | POA: Diagnosis not present

## 2024-04-08 ENCOUNTER — Ambulatory Visit

## 2024-04-14 ENCOUNTER — Other Ambulatory Visit: Payer: Self-pay

## 2024-04-14 DIAGNOSIS — J9622 Acute and chronic respiratory failure with hypercapnia: Secondary | ICD-10-CM | POA: Diagnosis not present

## 2024-04-14 DIAGNOSIS — J9621 Acute and chronic respiratory failure with hypoxia: Secondary | ICD-10-CM | POA: Diagnosis not present

## 2024-04-14 DIAGNOSIS — Z6841 Body Mass Index (BMI) 40.0 and over, adult: Secondary | ICD-10-CM | POA: Diagnosis not present

## 2024-04-14 MED ORDER — TORSEMIDE 20 MG PO TABS: 90.0000 mg | ORAL_TABLET | Freq: Every day | ORAL | 2 refills | Status: DC

## 2024-04-23 DIAGNOSIS — F32A Depression, unspecified: Secondary | ICD-10-CM | POA: Diagnosis not present

## 2024-04-23 DIAGNOSIS — J440 Chronic obstructive pulmonary disease with acute lower respiratory infection: Secondary | ICD-10-CM | POA: Diagnosis not present

## 2024-04-23 DIAGNOSIS — N401 Enlarged prostate with lower urinary tract symptoms: Secondary | ICD-10-CM | POA: Diagnosis not present

## 2024-04-23 DIAGNOSIS — I5022 Chronic systolic (congestive) heart failure: Secondary | ICD-10-CM | POA: Diagnosis not present

## 2024-04-23 DIAGNOSIS — G2581 Restless legs syndrome: Secondary | ICD-10-CM | POA: Diagnosis not present

## 2024-04-23 DIAGNOSIS — F03918 Unspecified dementia, unspecified severity, with other behavioral disturbance: Secondary | ICD-10-CM | POA: Diagnosis not present

## 2024-04-23 DIAGNOSIS — Z6839 Body mass index (BMI) 39.0-39.9, adult: Secondary | ICD-10-CM | POA: Diagnosis not present

## 2024-04-23 DIAGNOSIS — Z7982 Long term (current) use of aspirin: Secondary | ICD-10-CM | POA: Diagnosis not present

## 2024-04-23 DIAGNOSIS — J9621 Acute and chronic respiratory failure with hypoxia: Secondary | ICD-10-CM | POA: Diagnosis not present

## 2024-04-23 DIAGNOSIS — F17210 Nicotine dependence, cigarettes, uncomplicated: Secondary | ICD-10-CM | POA: Diagnosis not present

## 2024-04-23 DIAGNOSIS — J1282 Pneumonia due to coronavirus disease 2019: Secondary | ICD-10-CM | POA: Diagnosis not present

## 2024-04-23 DIAGNOSIS — R351 Nocturia: Secondary | ICD-10-CM | POA: Diagnosis not present

## 2024-04-23 DIAGNOSIS — J441 Chronic obstructive pulmonary disease with (acute) exacerbation: Secondary | ICD-10-CM | POA: Diagnosis not present

## 2024-04-23 DIAGNOSIS — D649 Anemia, unspecified: Secondary | ICD-10-CM | POA: Diagnosis not present

## 2024-04-23 DIAGNOSIS — G4733 Obstructive sleep apnea (adult) (pediatric): Secondary | ICD-10-CM | POA: Diagnosis not present

## 2024-04-23 DIAGNOSIS — I110 Hypertensive heart disease with heart failure: Secondary | ICD-10-CM | POA: Diagnosis not present

## 2024-04-23 DIAGNOSIS — I5033 Acute on chronic diastolic (congestive) heart failure: Secondary | ICD-10-CM | POA: Diagnosis not present

## 2024-04-23 DIAGNOSIS — U071 COVID-19: Secondary | ICD-10-CM | POA: Diagnosis not present

## 2024-04-23 DIAGNOSIS — Z9981 Dependence on supplemental oxygen: Secondary | ICD-10-CM | POA: Diagnosis not present

## 2024-04-23 DIAGNOSIS — G629 Polyneuropathy, unspecified: Secondary | ICD-10-CM | POA: Diagnosis not present

## 2024-04-23 DIAGNOSIS — E785 Hyperlipidemia, unspecified: Secondary | ICD-10-CM | POA: Diagnosis not present

## 2024-04-23 DIAGNOSIS — J439 Emphysema, unspecified: Secondary | ICD-10-CM | POA: Diagnosis not present

## 2024-04-23 DIAGNOSIS — F0393 Unspecified dementia, unspecified severity, with mood disturbance: Secondary | ICD-10-CM | POA: Diagnosis not present

## 2024-04-23 DIAGNOSIS — I4891 Unspecified atrial fibrillation: Secondary | ICD-10-CM | POA: Diagnosis not present

## 2024-04-24 DIAGNOSIS — J449 Chronic obstructive pulmonary disease, unspecified: Secondary | ICD-10-CM | POA: Diagnosis not present

## 2024-04-26 DIAGNOSIS — J449 Chronic obstructive pulmonary disease, unspecified: Secondary | ICD-10-CM | POA: Diagnosis not present

## 2024-04-28 ENCOUNTER — Other Ambulatory Visit: Payer: Self-pay | Admitting: Family Medicine

## 2024-04-28 DIAGNOSIS — G629 Polyneuropathy, unspecified: Secondary | ICD-10-CM | POA: Diagnosis not present

## 2024-04-28 DIAGNOSIS — Z7982 Long term (current) use of aspirin: Secondary | ICD-10-CM | POA: Diagnosis not present

## 2024-04-28 DIAGNOSIS — R351 Nocturia: Secondary | ICD-10-CM | POA: Diagnosis not present

## 2024-04-28 DIAGNOSIS — I110 Hypertensive heart disease with heart failure: Secondary | ICD-10-CM | POA: Diagnosis not present

## 2024-04-28 DIAGNOSIS — U071 COVID-19: Secondary | ICD-10-CM | POA: Diagnosis not present

## 2024-04-28 DIAGNOSIS — I4891 Unspecified atrial fibrillation: Secondary | ICD-10-CM | POA: Diagnosis not present

## 2024-04-28 DIAGNOSIS — I5022 Chronic systolic (congestive) heart failure: Secondary | ICD-10-CM | POA: Diagnosis not present

## 2024-04-28 DIAGNOSIS — J1282 Pneumonia due to coronavirus disease 2019: Secondary | ICD-10-CM | POA: Diagnosis not present

## 2024-04-28 DIAGNOSIS — J9621 Acute and chronic respiratory failure with hypoxia: Secondary | ICD-10-CM | POA: Diagnosis not present

## 2024-04-28 DIAGNOSIS — F0393 Unspecified dementia, unspecified severity, with mood disturbance: Secondary | ICD-10-CM | POA: Diagnosis not present

## 2024-04-28 DIAGNOSIS — E785 Hyperlipidemia, unspecified: Secondary | ICD-10-CM | POA: Diagnosis not present

## 2024-04-28 DIAGNOSIS — I5033 Acute on chronic diastolic (congestive) heart failure: Secondary | ICD-10-CM | POA: Diagnosis not present

## 2024-04-28 DIAGNOSIS — G4733 Obstructive sleep apnea (adult) (pediatric): Secondary | ICD-10-CM | POA: Diagnosis not present

## 2024-04-28 DIAGNOSIS — J440 Chronic obstructive pulmonary disease with acute lower respiratory infection: Secondary | ICD-10-CM | POA: Diagnosis not present

## 2024-04-28 DIAGNOSIS — N401 Enlarged prostate with lower urinary tract symptoms: Secondary | ICD-10-CM | POA: Diagnosis not present

## 2024-04-28 DIAGNOSIS — Z6839 Body mass index (BMI) 39.0-39.9, adult: Secondary | ICD-10-CM | POA: Diagnosis not present

## 2024-04-28 DIAGNOSIS — F17210 Nicotine dependence, cigarettes, uncomplicated: Secondary | ICD-10-CM | POA: Diagnosis not present

## 2024-04-28 DIAGNOSIS — Z9981 Dependence on supplemental oxygen: Secondary | ICD-10-CM | POA: Diagnosis not present

## 2024-04-28 DIAGNOSIS — G2581 Restless legs syndrome: Secondary | ICD-10-CM | POA: Diagnosis not present

## 2024-04-28 DIAGNOSIS — J439 Emphysema, unspecified: Secondary | ICD-10-CM | POA: Diagnosis not present

## 2024-04-28 DIAGNOSIS — F03918 Unspecified dementia, unspecified severity, with other behavioral disturbance: Secondary | ICD-10-CM | POA: Diagnosis not present

## 2024-04-28 DIAGNOSIS — J441 Chronic obstructive pulmonary disease with (acute) exacerbation: Secondary | ICD-10-CM | POA: Diagnosis not present

## 2024-04-28 DIAGNOSIS — F32A Depression, unspecified: Secondary | ICD-10-CM | POA: Diagnosis not present

## 2024-04-28 DIAGNOSIS — D649 Anemia, unspecified: Secondary | ICD-10-CM | POA: Diagnosis not present

## 2024-04-29 ENCOUNTER — Other Ambulatory Visit: Payer: Self-pay

## 2024-05-02 ENCOUNTER — Other Ambulatory Visit: Payer: Self-pay | Admitting: Family Medicine

## 2024-05-02 DIAGNOSIS — M1A09X0 Idiopathic chronic gout, multiple sites, without tophus (tophi): Secondary | ICD-10-CM

## 2024-05-12 ENCOUNTER — Telehealth: Payer: Self-pay

## 2024-05-12 NOTE — Telephone Encounter (Signed)
 Copied from CRM [id number]. Topic: General - Other >> May 12, 2024  4:13 PM Tobias L wrote: Reason for CRM: Terri w/ [facility] [employer] calling to advise hydroxyzine  has been approved effective today 05/12/24 up until 05/12/25. Terri states she will fax approval letter to office and confirmed fax number.

## 2024-05-15 DIAGNOSIS — J9622 Acute and chronic respiratory failure with hypercapnia: Secondary | ICD-10-CM | POA: Diagnosis not present

## 2024-05-15 DIAGNOSIS — Z6841 Body Mass Index (BMI) 40.0 and over, adult: Secondary | ICD-10-CM | POA: Diagnosis not present

## 2024-05-15 DIAGNOSIS — J9621 Acute and chronic respiratory failure with hypoxia: Secondary | ICD-10-CM | POA: Diagnosis not present

## 2024-05-24 DIAGNOSIS — J449 Chronic obstructive pulmonary disease, unspecified: Secondary | ICD-10-CM | POA: Diagnosis not present

## 2024-05-26 DIAGNOSIS — J449 Chronic obstructive pulmonary disease, unspecified: Secondary | ICD-10-CM | POA: Diagnosis not present

## 2024-05-30 ENCOUNTER — Other Ambulatory Visit: Payer: Self-pay | Admitting: Family Medicine

## 2024-06-09 ENCOUNTER — Other Ambulatory Visit: Payer: Self-pay | Admitting: Family Medicine

## 2024-06-14 DIAGNOSIS — Z6841 Body Mass Index (BMI) 40.0 and over, adult: Secondary | ICD-10-CM | POA: Diagnosis not present

## 2024-06-14 DIAGNOSIS — J9621 Acute and chronic respiratory failure with hypoxia: Secondary | ICD-10-CM | POA: Diagnosis not present

## 2024-06-14 DIAGNOSIS — J9622 Acute and chronic respiratory failure with hypercapnia: Secondary | ICD-10-CM | POA: Diagnosis not present

## 2024-06-16 ENCOUNTER — Encounter: Payer: Self-pay | Admitting: Family Medicine

## 2024-06-16 ENCOUNTER — Ambulatory Visit (INDEPENDENT_AMBULATORY_CARE_PROVIDER_SITE_OTHER): Admitting: Family Medicine

## 2024-06-16 VITALS — BP 128/74 | HR 87 | Ht 70.0 in | Wt 300.0 lb

## 2024-06-16 DIAGNOSIS — F02B3 Dementia in other diseases classified elsewhere, moderate, with mood disturbance: Secondary | ICD-10-CM

## 2024-06-16 DIAGNOSIS — I110 Hypertensive heart disease with heart failure: Secondary | ICD-10-CM

## 2024-06-16 DIAGNOSIS — E782 Mixed hyperlipidemia: Secondary | ICD-10-CM

## 2024-06-16 DIAGNOSIS — L89892 Pressure ulcer of other site, stage 2: Secondary | ICD-10-CM

## 2024-06-16 DIAGNOSIS — J410 Simple chronic bronchitis: Secondary | ICD-10-CM

## 2024-06-16 DIAGNOSIS — L89893 Pressure ulcer of other site, stage 3: Secondary | ICD-10-CM

## 2024-06-16 DIAGNOSIS — R7303 Prediabetes: Secondary | ICD-10-CM | POA: Diagnosis not present

## 2024-06-16 DIAGNOSIS — R0602 Shortness of breath: Secondary | ICD-10-CM

## 2024-06-16 DIAGNOSIS — J441 Chronic obstructive pulmonary disease with (acute) exacerbation: Secondary | ICD-10-CM

## 2024-06-16 DIAGNOSIS — G300 Alzheimer's disease with early onset: Secondary | ICD-10-CM | POA: Diagnosis not present

## 2024-06-16 DIAGNOSIS — I5042 Chronic combined systolic (congestive) and diastolic (congestive) heart failure: Secondary | ICD-10-CM

## 2024-06-16 DIAGNOSIS — G2581 Restless legs syndrome: Secondary | ICD-10-CM

## 2024-06-16 DIAGNOSIS — Z7409 Other reduced mobility: Secondary | ICD-10-CM

## 2024-06-16 DIAGNOSIS — I4811 Longstanding persistent atrial fibrillation: Secondary | ICD-10-CM

## 2024-06-16 MED ORDER — DOXYCYCLINE HYCLATE 100 MG PO TABS: 100.0000 mg | ORAL_TABLET | Freq: Two times a day (BID) | ORAL | 0 refills | Status: DC

## 2024-06-16 NOTE — Progress Notes (Unsigned)
 Subjective:  Patient ID: Shawn Osborn, male    DOB: 1951/10/09  Age: 73 y.o. MRN: [id number]  Chief Complaint  Patient presents with   Medical Management of Chronic Issues     HPI:  Patient presents today for FL2 forms to be completed.      03/12/2024    3:22 PM 09/09/2023    1:50 PM 06/19/2023    2:06 PM 03/27/2023   11:25 AM 10/30/2022    4:14 PM  Depression screen PHQ 2/9  Decreased Interest 0 0 0 0 0  Down, Depressed, Hopeless 0 0 0 0 0  PHQ - 2 Score 0 0 0 0 0  Altered sleeping    0   Tired, decreased energy    2   Change in appetite    0   Feeling bad or failure about yourself     0   Trouble concentrating    0   Moving slowly or fidgety/restless    0   Suicidal thoughts    0   PHQ-9 Score    2   Difficult doing work/chores    Not difficult at all         03/12/2024    3:22 PM  Fall Risk   Falls in the past year? 0  Number falls in past yr: 0  Injury with Fall? 0  Risk for fall due to : No Fall Risks    Patient Care Team: Sherre Clapper, MD as PCP - General (Family Medicine) Tobb, Kardie, DO as PCP - Cardiology (Cardiology)   Review of Systems  Constitutional:  Negative for chills, diaphoresis, fatigue and fever.  HENT:  Negative for congestion, ear pain and sore throat.   Eyes:  Positive for discharge (watery right eye).  Respiratory:  Negative for cough and shortness of breath.   Cardiovascular:  Negative for chest pain and leg swelling.  Gastrointestinal:  Negative for abdominal pain, constipation, diarrhea, nausea and vomiting.  Genitourinary:  Negative for dysuria and urgency.  Musculoskeletal:  Negative for arthralgias and myalgias.  Neurological:  Negative for dizziness and headaches.  Psychiatric/Behavioral:  Negative for dysphoric mood.     Current Outpatient Medications on File Prior to Visit  Medication Sig Dispense Refill   albuterol  (PROVENTIL ) (2.5 MG/3ML) 0.083% nebulizer solution Take 3 mLs (2.5 mg total) by nebulization every 6  (six) hours as needed for wheezing or shortness of breath. 360 mL 12   albuterol  (VENTOLIN  HFA) 108 (90 Base) MCG/ACT inhaler Inhale 2 puffs into the lungs every 6 (six) hours as needed for wheezing or shortness of breath. 6.7 g 12   allopurinol  (ZYLOPRIM ) 300 MG tablet TAKE ONE TABLET BY MOUTH EVERY DAY at 1pm 30 tablet 3   apixaban  (ELIQUIS ) 5 MG TABS tablet Take 1 tablet (5 mg total) by mouth 2 (two) times daily. 60 tablet 0   donepezil  (ARICEPT ) 10 MG tablet TAKE ONE TABLET BY MOUTH EVERY EVENING 90 tablet 3   hydrOXYzine  (ATARAX ) 50 MG tablet TAKE ONE TABLET BY MOUTH AT BEDTIME 30 tablet 1   ipratropium-albuterol  (DUONEB) 0.5-2.5 (3) MG/3ML SOLN Take 3 mLs by nebulization every 4 (four) hours as needed. (Patient taking differently: Take 3 mLs by nebulization every 4 (four) hours as needed (SOB).) 360 mL 2   memantine  (NAMENDA  XR) 28 MG CP24 24 hr capsule TAKE ONE CAPSULE BY MOUTH AT BEDTIME 90 capsule 1   omega-3 acid ethyl esters (LOVAZA ) 1 g capsule TAKE 2 CAPSULES BY MOUTH TWICE  DAILY 360 capsule 1   omeprazole  (PRILOSEC) 40 MG capsule TAKE ONE CAPSULE BY MOUTH EVERY DAY 30 capsule 5   potassium chloride  SA (KLOR-CON  M) 20 MEQ tablet Take 1 tablet (20 mEq total) by mouth daily. 30 tablet 0   pregabalin  (LYRICA ) 75 MG capsule TAKE ONE CAPSULE BY MOUTH EVERY DAY and TAKE ONE CAPSULE BY MOUTH EVERY DAY at 1pm and TAKE ONE CAPSULE BY MOUTH AT BEDTIME 90 capsule 2   propranolol  ER (INDERAL  LA) 60 MG 24 hr capsule TAKE ONE CAPSULE BY MOUTH EVERY DAY 30 capsule 5   QUEtiapine  (SEROQUEL ) 50 MG tablet TAKE ONE TABLET BY MOUTH AT BEDTIME 30 tablet 11   ramelteon  (ROZEREM ) 8 MG tablet TAKE ONE TABLET BY MOUTH AT BEDTIME 30 tablet 11   rOPINIRole  (REQUIP ) 0.5 MG tablet TAKE ONE TABLET BY MOUTH AT BEDTIME 30 tablet 2   rosuvastatin  (CRESTOR ) 10 MG tablet TAKE ONE TABLET BY MOUTH AT BEDTIME 90 tablet 1   sertraline  (ZOLOFT ) 100 MG tablet TAKE ONE TABLET BY MOUTH AT BEDTIME 30 tablet 5   tamsulosin   (FLOMAX ) 0.4 MG CAPS capsule TAKE 2 CAPSULES BY MOUTH EVERY EVENING 60 capsule 5   torsemide  (DEMADEX ) 20 MG tablet Take 4.5 tablets (90 mg total) by mouth daily. 135 tablet 2   TRELEGY ELLIPTA  100-62.5-25 MCG/ACT AEPB Inhale 1 puff into the lungs daily.     Vitamin D , Ergocalciferol , (DRISDOL ) 1.25 MG (50000 UNIT) CAPS capsule TAKE ONE CAPSULE BY MOUTH EVERY WEEK 15 capsule 3   No current facility-administered medications on file prior to visit.   Past Medical History:  Diagnosis Date   Acute combined systolic and diastolic CHF, NYHA class 3 (HCC) 10/08/2019   Acute delirium 10/08/2019   Acute exacerbation of CHF (congestive heart failure) (HCC) 10/08/2019   Acute gout of multiple sites 07/01/2016   Acute respiratory failure with hypoxia and hypercapnia (HCC) 10/08/2019   Allergy    Anxiety    Arthritis    Gout, osteoarthritis in Back and Knees   Auditory hallucinations 05/01/2022   Cataract    CHF (congestive heart failure) (HCC)    Chronic bronchitis    Community acquired pneumonia 10/08/2019   COPD (chronic obstructive pulmonary disease) (HCC) 10/08/2019   Dementia (HCC) 10/08/2019   Elevated troponin 10/08/2019   Emphysema of lung (HCC)    Essential hypertension 07/03/2016   GERD (gastroesophageal reflux disease)    History of kidney stones    HLD (hyperlipidemia) 10/08/2019   Hypercapnic respiratory failure (HCC) 10/08/2019   Hyperlipidemia    Hypertension    Hypertensive heart disease 10/08/2019   Morbid obesity due to excess calories (HCC) 10/08/2019   Narcotic overdose (HCC) 10/08/2019   Neuropathy    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   NSTEMI (non-ST elevated myocardial infarction) (HCC) 10/08/2019   Obstructive sleep apnea    Occlusion and stenosis of carotid artery without mention of cerebral infarction 12/13/2011   OSA on CPAP 10/08/2019   Pain in joint of left shoulder 01/12/2019   Paranoia (psychosis) (HCC) 07/01/2016   Poorly-controlled hypertension  10/08/2019   Prediabetes    Psychotic disorder with delusions (HCC) 07/01/2016   Respiratory failure with hypoxia and hypercapnia (HCC) 10/08/2019   Restless leg syndrome 07/03/2016   Rheumatoid arthritis(714.0)    RLS (restless legs syndrome)    Sepsis (HCC) 10/08/2019   Sleep apnea    Spinal stenosis    Syncope 04/03/2019   Tear of left rotator cuff 01/12/2019  Toxic metabolic encephalopathy 10/08/2019   UTI (urinary tract infection) 10/08/2019   Vascular dementia with behavioral disturbance (HCC) 07/06/2016   Visual hallucinations 05/01/2022   Past Surgical History:  Procedure Laterality Date   AMPUTATION TOE Left 01/01/2023   APPENDECTOMY     CARPAL TUNNEL RELEASE     CORONARY PRESSURE/FFR STUDY N/A 10/20/2019   Procedure: INTRAVASCULAR PRESSURE WIRE/FFR STUDY;  Surgeon: Claudene Victory ORN, MD;  Location: [facility];  Service: Cardiovascular;  Laterality: N/A;   EYE SURGERY     LEFT HEART CATH AND CORONARY ANGIOGRAPHY N/A 10/20/2019   Procedure: LEFT HEART CATH AND CORONARY ANGIOGRAPHY;  Surgeon: Claudene Victory ORN, MD;  Location: [facility];  Service: Cardiovascular;  Laterality: N/A;   SPINE SURGERY  1998, 2003, 2012   Laminectomy X 3    Family History  Problem Relation Age of Onset   Heart disease Mother    Hypertension Mother    Social History   Socioeconomic History   Marital status: Widowed    Spouse name: Not on file   Number of children: 4   Years of education: Not on file   Highest education level: 7th grade  Occupational History   Not on file  Tobacco Use   Smoking status: Former    Current packs/day: 0.75    Average packs/day: 0.8 packs/day for 58.6 years (43.9 ttl pk-yrs)    Types: Cigarettes    Start date: 77   Smokeless tobacco: Current    Types: Chew  Vaping Use   Vaping status: Never Used  Substance and Sexual Activity   Alcohol use: No   Drug use: No   Sexual activity: Not Currently  Other Topics Concern   Not on file  Social  History Narrative   Not on file   Social Drivers of Health   Financial Resource Strain: Low Risk  (06/19/2023)   Overall Financial Resource Strain (CARDIA)    Difficulty of Paying Living Expenses: Not very hard  Food Insecurity: No Food Insecurity (06/16/2024)   Hunger Vital Sign    Worried About Running Out of Food in the Last Year: Never true    Ran Out of Food in the Last Year: Never true  Transportation Needs: No Transportation Needs (06/16/2024)   PRAPARE - Administrator, Civil Service (Medical): No    Lack of Transportation (Non-Medical): No  Physical Activity: Inactive (06/19/2023)   Exercise Vital Sign    Days of Exercise per Week: 0 days    Minutes of Exercise per Session: 0 min  Stress: No Stress Concern Present (06/19/2023)   [employer] of Occupational Health - Occupational Stress Questionnaire    Feeling of Stress : Not at all  Social Connections: Moderately Isolated (11/19/2023)   Social Connection and Isolation Panel    Frequency of Communication with Friends and Family: Three times a week    Frequency of Social Gatherings with Friends and Family: Three times a week    Attends Religious Services: More than 4 times per year    Active Member of Clubs or Organizations: No    Attends Banker Meetings: Never    Marital Status: Widowed    Objective:  Pulse 87   Ht 5' 10 (1.778 m)   Wt 300 lb (136.1 kg) Comment: Patient reported  SpO2 90%   BMI 43.05 kg/m      06/16/2024    4:15 PM 03/12/2024    3:15 PM 11/19/2023   10:08  AM  BP/Weight  Systolic BP  122 871  Diastolic BP  88 64  Wt. (Lbs) 300  243  BMI 43.05 kg/m2  34.87 kg/m2    Physical Exam  {Perform Simple Foot Exam  Perform Detailed exam:1} {Insert foot Exam (Optional):30965}   Lab Results  Component Value Date   WBC 11.9 (H) 11/19/2023   HGB 13.3 11/19/2023   HCT 42.8 11/19/2023   PLT 272 11/19/2023   GLUCOSE 91 11/19/2023   CHOL 184 11/19/2023   TRIG 192 (H)  11/19/2023   HDL 45 11/19/2023   LDLCALC 106 (H) 11/19/2023   ALT 13 11/19/2023   AST 15 11/19/2023   NA 147 (H) 11/19/2023   K 5.2 11/19/2023   CL 107 (H) 11/19/2023   CREATININE 0.72 (L) 11/19/2023   BUN 11 11/19/2023   CO2 24 11/19/2023   TSH 1.850 05/22/2023   INR 0.9 08/30/2020   HGBA1C 5.7 (H) 09/09/2023      Assessment & Plan:  There are no diagnoses linked to this encounter.   No orders of the defined types were placed in this encounter.   No orders of the defined types were placed in this encounter.    Follow-up: No follow-ups on file.   I,Katherina A Bramblett,acting as a scribe for Abigail Free, MD.,have documented all relevant documentation on the behalf of Abigail Free, MD,as directed by  Abigail Free, MD while in the presence of Abigail Free, MD.   An After Visit Summary was printed and given to the patient.  Abigail Free, MD [facility] [phone]

## 2024-06-18 ENCOUNTER — Telehealth: Payer: Self-pay

## 2024-06-18 DIAGNOSIS — L89892 Pressure ulcer of other site, stage 2: Secondary | ICD-10-CM | POA: Insufficient documentation

## 2024-06-18 DIAGNOSIS — R0602 Shortness of breath: Secondary | ICD-10-CM | POA: Insufficient documentation

## 2024-06-18 DIAGNOSIS — L89893 Pressure ulcer of other site, stage 3: Secondary | ICD-10-CM | POA: Insufficient documentation

## 2024-06-18 NOTE — Assessment & Plan Note (Signed)
 Check A1c.  Recommend continue diabetic diet.

## 2024-06-18 NOTE — Assessment & Plan Note (Signed)
 Cellulitis present also.  Unna boot applied.  Doxycycline  given.  Follow up in 1 week for recheck.

## 2024-06-18 NOTE — Assessment & Plan Note (Signed)
Check quantiferon gold

## 2024-06-18 NOTE — Assessment & Plan Note (Signed)
 Well controlled.  No changes to medicines. Rosuvastatin  5 mg daily, lovaza  2g BID. Continue to work on eating a healthy diet and exercise. Check lipids

## 2024-06-18 NOTE — Assessment & Plan Note (Addendum)
 Progressing.  Continue current medications. Namenda  and Aricept .  Requires more care then he can get at home.  FL2 Form filled out.

## 2024-06-18 NOTE — Assessment & Plan Note (Addendum)
 Continue torsemide  at current dose. Continue propanolol ER (also for tremor) and potassium chloride  20 meq once daily.

## 2024-06-18 NOTE — Assessment & Plan Note (Signed)
 Chronic atrial fibrillation managed with Eliquis  5 mg twice daily to prevent stroke. Continue Eliquis  5 mg twice daily

## 2024-06-18 NOTE — Telephone Encounter (Signed)
 Patient ha an appointment next week  Copied from CRM [id number]. Topic: General - Other >> Jun 17, 2024 12:11 PM Shawn Osborn wrote: [facility] need the following items before they can accept the pt into the nursing home.  - Physical  -Visit notes from his last couple of appointments ( no clear number as to how many they need) -Immunization Records -Demographic sheet  Fax to [phone]

## 2024-06-18 NOTE — Assessment & Plan Note (Signed)
 Requires max assistance.  Needs physical therapy and CNA to help with with ADLs FL2 filled out for long term care facility.

## 2024-06-18 NOTE — Assessment & Plan Note (Addendum)
 Fully quit smoking.  Continue trelegy one inhalation daily.  Continue albuterol  2 puffs four times a day as needed  Not wheezing on exam.  Patient also has chronic respiratory failure with hypoxia requiring 2 L oxygen . Encouraged patient to wear this continuously.

## 2024-06-18 NOTE — Assessment & Plan Note (Signed)
 Unna boot applied.  Doxycycline  given.  Follow up in 1 week for recheck.

## 2024-06-19 LAB — CBC WITH DIFFERENTIAL/PLATELET
Basophils Absolute: 0.1 x10E3/uL (ref 0.0–0.2)
Basos: 1 %
EOS (ABSOLUTE): 0.1 x10E3/uL (ref 0.0–0.4)
Eos: 1 %
Hematocrit: 44.6 % (ref 37.5–51.0)
Hemoglobin: 13.4 g/dL (ref 13.0–17.7)
Immature Grans (Abs): 0 x10E3/uL (ref 0.0–0.1)
Immature Granulocytes: 0 %
Lymphocytes Absolute: 2.2 x10E3/uL (ref 0.7–3.1)
Lymphs: 19 %
MCH: 27.4 pg (ref 26.6–33.0)
MCHC: 30 g/dL — ABNORMAL LOW (ref 31.5–35.7)
MCV: 91 fL (ref 79–97)
Monocytes Absolute: 0.7 x10E3/uL (ref 0.1–0.9)
Monocytes: 6 %
Neutrophils Absolute: 8.5 x10E3/uL — ABNORMAL HIGH (ref 1.4–7.0)
Neutrophils: 73 %
Platelets: 243 x10E3/uL (ref 150–450)
RBC: 4.89 x10E6/uL (ref 4.14–5.80)
RDW: 14.1 % (ref 11.6–15.4)
WBC: 11.5 x10E3/uL — ABNORMAL HIGH (ref 3.4–10.8)

## 2024-06-19 LAB — COMPREHENSIVE METABOLIC PANEL WITH GFR
ALT: 8 IU/L (ref 0–44)
AST: 14 IU/L (ref 0–40)
Albumin: 3.7 g/dL — ABNORMAL LOW (ref 3.8–4.8)
Alkaline Phosphatase: 87 IU/L (ref 44–121)
BUN/Creatinine Ratio: 17 (ref 10–24)
BUN: 10 mg/dL (ref 8–27)
Bilirubin Total: 0.3 mg/dL (ref 0.0–1.2)
CO2: 32 mmol/L — ABNORMAL HIGH (ref 20–29)
Calcium: 9 mg/dL (ref 8.6–10.2)
Chloride: 93 mmol/L — ABNORMAL LOW (ref 96–106)
Creatinine, Ser: 0.58 mg/dL — ABNORMAL LOW (ref 0.76–1.27)
Globulin, Total: 2.8 g/dL (ref 1.5–4.5)
Glucose: 95 mg/dL (ref 70–99)
Potassium: 3.3 mmol/L — ABNORMAL LOW (ref 3.5–5.2)
Sodium: 147 mmol/L — ABNORMAL HIGH (ref 134–144)
Total Protein: 6.5 g/dL (ref 6.0–8.5)
eGFR: 103 mL/min/1.73 (ref >59)

## 2024-06-19 LAB — LIPID PANEL
Chol/HDL Ratio: 3 ratio (ref 0.0–5.0)
Cholesterol, Total: 131 mg/dL (ref 100–199)
HDL: 44 mg/dL (ref >39)
LDL Chol Calc (NIH): 63 mg/dL (ref 0–99)
Triglycerides: 140 mg/dL (ref 0–149)
VLDL Cholesterol Cal: 24 mg/dL (ref 5–40)

## 2024-06-19 LAB — QUANTIFERON-TB GOLD PLUS
QuantiFERON Mitogen Value: 7.24 [IU]/mL
QuantiFERON Nil Value: 0.01 [IU]/mL
QuantiFERON TB1 Ag Value: 0.01 [IU]/mL
QuantiFERON TB2 Ag Value: 0.01 [IU]/mL
QuantiFERON-TB Gold Plus: NEGATIVE

## 2024-06-19 LAB — TSH: TSH: 4.28 u[IU]/mL (ref 0.450–4.500)

## 2024-06-19 LAB — HEMOGLOBIN A1C
Est. average glucose Bld gHb Est-mCnc: 120 mg/dL
Hgb A1c MFr Bld: 5.8 % — ABNORMAL HIGH (ref 4.8–5.6)

## 2024-06-19 NOTE — Telephone Encounter (Signed)
 As requested, the records have been faxed.

## 2024-06-21 ENCOUNTER — Ambulatory Visit: Payer: Self-pay | Admitting: Family Medicine

## 2024-06-22 ENCOUNTER — Ambulatory Visit

## 2024-06-22 ENCOUNTER — Encounter: Admitting: Family Medicine

## 2024-06-22 DIAGNOSIS — Z0000 Encounter for general adult medical examination without abnormal findings: Secondary | ICD-10-CM

## 2024-06-22 NOTE — Progress Notes (Signed)
 Labs have been faxed to [facility].

## 2024-06-22 NOTE — Progress Notes (Addendum)
 Subjective:   Shawn Osborn is a 73 y.o. male who presents for Medicare Annual/Subsequent preventive examination.  This wellness visit is conducted by a nurse.  The patient's medications were reviewed and reconciled since the patient's last visit.  History details were provided by the patient.  The history appears to be reliable.    Medical History: Patient history and Family history was reviewed  Medications, Allergies, and preventative health maintenance was reviewed and updated.   Visit Complete: Virtual I connected with  Shawn Osborn on 06/22/24 by a audio enabled telemedicine application and verified that I am speaking with the correct person using two identifiers.  Patient Location: Home  Provider Location: Office/Clinic  I discussed the limitations of evaluation and management by telemedicine. The patient expressed understanding and agreed to proceed.  Vital Signs: Because this visit was a virtual/telehealth visit, some criteria may be missing or patient reported. Any vitals not documented were not able to be obtained and vitals that have been documented are patient reported.   Cardiac Risk Factors include: male gender;advanced age (>39men, >25 women);dyslipidemia;sedentary lifestyle;smoking/ tobacco exposure;obesity (BMI >30kg/m2)     Objective:    Today's Vitals   06/22/24 1451  PainSc: 6   PainLoc: Leg  Patient was unable to self-report due to a lack of equipment at home via telehealth  There is no height or weight on file to calculate BMI.     08/31/2020   12:00 PM 10/20/2019    7:47 AM 12/04/2018    2:15 PM  Advanced Directives  Does Patient Have a Medical Advance Directive? No No No   Would patient like information on creating a medical advance directive? No - Patient declined No - Patient declined Yes (MAU/Ambulatory/Procedural Areas - Information given)      Data saved with a previous flowsheet row definition    Current Medications  (verified) Outpatient Encounter Medications as of 06/22/2024  Medication Sig   albuterol  (PROVENTIL ) (2.5 MG/3ML) 0.083% nebulizer solution Take 3 mLs (2.5 mg total) by nebulization every 6 (six) hours as needed for wheezing or shortness of breath.   albuterol  (VENTOLIN  HFA) 108 (90 Base) MCG/ACT inhaler Inhale 2 puffs into the lungs every 6 (six) hours as needed for wheezing or shortness of breath.   allopurinol  (ZYLOPRIM ) 300 MG tablet TAKE ONE TABLET BY MOUTH EVERY DAY at 1pm   apixaban  (ELIQUIS ) 5 MG TABS tablet Take 1 tablet (5 mg total) by mouth 2 (two) times daily.   donepezil  (ARICEPT ) 10 MG tablet TAKE ONE TABLET BY MOUTH EVERY EVENING   doxycycline  (VIBRA -TABS) 100 MG tablet Take 1 tablet (100 mg total) by mouth 2 (two) times daily.   hydrOXYzine  (ATARAX ) 50 MG tablet TAKE ONE TABLET BY MOUTH AT BEDTIME   ipratropium-albuterol  (DUONEB) 0.5-2.5 (3) MG/3ML SOLN Take 3 mLs by nebulization every 4 (four) hours as needed. (Patient taking differently: Take 3 mLs by nebulization every 4 (four) hours as needed (SOB).)   memantine  (NAMENDA  XR) 28 MG CP24 24 hr capsule TAKE ONE CAPSULE BY MOUTH AT BEDTIME   omega-3 acid ethyl esters (LOVAZA ) 1 g capsule TAKE 2 CAPSULES BY MOUTH TWICE DAILY   omeprazole  (PRILOSEC) 40 MG capsule TAKE ONE CAPSULE BY MOUTH EVERY DAY   potassium chloride  SA (KLOR-CON  M) 20 MEQ tablet Take 1 tablet (20 mEq total) by mouth daily.   pregabalin  (LYRICA ) 75 MG capsule TAKE ONE CAPSULE BY MOUTH EVERY DAY and TAKE ONE CAPSULE BY MOUTH EVERY DAY at 1pm and TAKE  ONE CAPSULE BY MOUTH AT BEDTIME   propranolol  ER (INDERAL  LA) 60 MG 24 hr capsule TAKE ONE CAPSULE BY MOUTH EVERY DAY   QUEtiapine  (SEROQUEL ) 50 MG tablet TAKE ONE TABLET BY MOUTH AT BEDTIME   ramelteon  (ROZEREM ) 8 MG tablet TAKE ONE TABLET BY MOUTH AT BEDTIME   rOPINIRole  (REQUIP ) 0.5 MG tablet TAKE ONE TABLET BY MOUTH AT BEDTIME   rosuvastatin  (CRESTOR ) 10 MG tablet TAKE ONE TABLET BY MOUTH AT BEDTIME   sertraline   (ZOLOFT ) 100 MG tablet TAKE ONE TABLET BY MOUTH AT BEDTIME   tamsulosin  (FLOMAX ) 0.4 MG CAPS capsule TAKE 2 CAPSULES BY MOUTH EVERY EVENING   torsemide  (DEMADEX ) 20 MG tablet Take 4.5 tablets (90 mg total) by mouth daily.   TRELEGY ELLIPTA  100-62.5-25 MCG/ACT AEPB Inhale 1 puff into the lungs daily.   Vitamin D , Ergocalciferol , (DRISDOL ) 1.25 MG (50000 UNIT) CAPS capsule TAKE ONE CAPSULE BY MOUTH EVERY WEEK   No facility-administered encounter medications on file as of 06/22/2024.    Allergies (verified) Avelox [moxifloxacin hcl in nacl], Codeine phosphate, Colesevelam, Cozaar, Cymbalta [duloxetine hcl], Ezetimibe, Methocarbamol, Ropinirole , Statins, Welchol [colesevelam hcl], and Penicillins   History: Past Medical History:  Diagnosis Date   Acute combined systolic and diastolic CHF, NYHA class 3 (HCC) 10/08/2019   Acute delirium 10/08/2019   Acute exacerbation of CHF (congestive heart failure) (HCC) 10/08/2019   Acute gout of multiple sites 07/01/2016   Acute respiratory failure with hypoxia and hypercapnia (HCC) 10/08/2019   Allergy    Anxiety    Arthritis    Gout, osteoarthritis in Back and Knees   Auditory hallucinations 05/01/2022   Cataract    CHF (congestive heart failure) (HCC)    Chronic bronchitis    Community acquired pneumonia 10/08/2019   COPD (chronic obstructive pulmonary disease) (HCC) 10/08/2019   Dementia (HCC) 10/08/2019   Elevated troponin 10/08/2019   Emphysema of lung (HCC)    Essential hypertension 07/03/2016   GERD (gastroesophageal reflux disease)    History of kidney stones    HLD (hyperlipidemia) 10/08/2019   Hypercapnic respiratory failure (HCC) 10/08/2019   Hyperlipidemia    Hypertension    Hypertensive heart disease 10/08/2019   Morbid obesity due to excess calories (HCC) 10/08/2019   Narcotic overdose (HCC) 10/08/2019   Neuropathy    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   NSTEMI (non-ST elevated myocardial infarction) (HCC) 10/08/2019    Obstructive sleep apnea    Occlusion and stenosis of carotid artery without mention of cerebral infarction 12/13/2011   OSA on CPAP 10/08/2019   Pain in joint of left shoulder 01/12/2019   Paranoia (psychosis) (HCC) 07/01/2016   Poorly-controlled hypertension 10/08/2019   Prediabetes    Psychotic disorder with delusions (HCC) 07/01/2016   Respiratory failure with hypoxia and hypercapnia (HCC) 10/08/2019   Restless leg syndrome 07/03/2016   Rheumatoid arthritis(714.0)    RLS (restless legs syndrome)    Sepsis (HCC) 10/08/2019   Sleep apnea    Spinal stenosis    Syncope 04/03/2019   Tear of left rotator cuff 01/12/2019   Toxic metabolic encephalopathy 10/08/2019   UTI (urinary tract infection) 10/08/2019   Vascular dementia with behavioral disturbance (HCC) 07/06/2016   Visual hallucinations 05/01/2022   Past Surgical History:  Procedure Laterality Date   AMPUTATION TOE Left 01/01/2023   APPENDECTOMY     CARPAL TUNNEL RELEASE     CORONARY PRESSURE/FFR STUDY N/A 10/20/2019   Procedure: INTRAVASCULAR PRESSURE WIRE/FFR STUDY;  Surgeon: Claudene Victory ORN, MD;  Location:  [facility];  Service: Cardiovascular;  Laterality: N/A;   EYE SURGERY     LEFT HEART CATH AND CORONARY ANGIOGRAPHY N/A 10/20/2019   Procedure: LEFT HEART CATH AND CORONARY ANGIOGRAPHY;  Surgeon: Claudene Victory ORN, MD;  Location: [facility];  Service: Cardiovascular;  Laterality: N/A;   SPINE SURGERY  1998, 2003, 2012   Laminectomy X 3   Family History  Problem Relation Age of Onset   Heart disease Mother    Hypertension Mother    Social History   Socioeconomic History   Marital status: Widowed    Spouse name: Not on file   Number of children: 4   Years of education: Not on file   Highest education level: 7th grade  Occupational History   Not on file  Tobacco Use   Smoking status: Some Days    Current packs/day: 0.75    Average packs/day: 0.8 packs/day for 58.6 years (43.9 ttl pk-yrs)    Types:  Cigarettes    Start date: 34   Smokeless tobacco: Current    Types: Chew  Vaping Use   Vaping status: Never Used  Substance and Sexual Activity   Alcohol use: No   Drug use: No   Sexual activity: Not Currently  Other Topics Concern   Not on file  Social History Narrative   Not on file   Social Drivers of Health   Financial Resource Strain: Low Risk  (06/22/2024)   Overall Financial Resource Strain (CARDIA)    Difficulty of Paying Living Expenses: Not very hard  Food Insecurity: No Food Insecurity (06/16/2024)   Hunger Vital Sign    Worried About Running Out of Food in the Last Year: Never true    Ran Out of Food in the Last Year: Never true  Transportation Needs: No Transportation Needs (06/16/2024)   PRAPARE - Administrator, Civil Service (Medical): No    Lack of Transportation (Non-Medical): No  Physical Activity: Inactive (06/22/2024)   Exercise Vital Sign    Days of Exercise per Week: 0 days    Minutes of Exercise per Session: 0 min  Stress: No Stress Concern Present (06/22/2024)   [employer] of Occupational Health - Occupational Stress Questionnaire    Feeling of Stress: Not at all  Social Connections: Moderately Isolated (11/19/2023)   Social Connection and Isolation Panel    Frequency of Communication with Friends and Family: Three times a week    Frequency of Social Gatherings with Friends and Family: Three times a week    Attends Religious Services: More than 4 times per year    Active Member of Clubs or Organizations: No    Attends Banker Meetings: Never    Marital Status: Widowed    Tobacco Counseling Ready to quit: No Counseling given: Not Answered   Clinical Intake:  Pre-visit preparation completed: Yes  Pain : 0-10 Pain Score: 6  Pain Type: Chronic pain Pain Location: Leg (Bilateral) Pain Descriptors / Indicators: Aching Pain Frequency: Constant     BMI - recorded: 43.05 Nutritional Status: BMI > 30   Obese Diabetes: No  How often do you need to have someone help you when you read instructions, pamphlets, or other written materials from your doctor or pharmacy?: 3 - Sometimes  Interpreter Needed?: No      Activities of Daily Living    06/22/2024    2:57 PM  In your present state of health, do you have any difficulty performing the  following activities:  Hearing? 0  Vision? 0  Difficulty concentrating or making decisions? 1  Walking or climbing stairs? 1  Dressing or bathing? 1  Doing errands, shopping? 1  Preparing Food and eating ? N  Using the Toilet? Y  In the past six months, have you accidently leaked urine? Y  Do you have problems with loss of bowel control? N  Managing your Medications? N  Managing your Finances? N  Housekeeping or managing your Housekeeping? Y    Patient Care Team: Sherre Clapper, MD as PCP - General (Family Medicine) Tobb, Kardie, DO as PCP - Cardiology (Cardiology)  Indicate any recent Medical Services you may have received from other than [facility] providers in the past year (date may be approximate).     Assessment:   This is a routine wellness examination for Shawn Osborn.  Hearing/Vision screen No results found.   Goals Addressed   None    Depression Screen    06/22/2024    2:55 PM 03/12/2024    3:22 PM 09/09/2023    1:50 PM 06/19/2023    2:06 PM 03/27/2023   11:25 AM 10/30/2022    4:14 PM 10/17/2022   10:53 AM  PHQ 2/9 Scores  PHQ - 2 Score 0 0 0 0 0 0 0  PHQ- 9 Score     2      Fall Risk    06/22/2024    2:57 PM 03/12/2024    3:22 PM 09/09/2023    1:50 PM 06/19/2023    2:05 PM 03/27/2023   11:25 AM  Fall Risk   Falls in the past year? 0 0 1 0 0  Number falls in past yr: 0 0  0 0  Injury with Fall? 0 0 1 0 0  Risk for fall due to : Impaired balance/gait;Impaired mobility No Fall Risks Impaired mobility;Impaired balance/gait  No Fall Risks  Follow up Falls evaluation completed;Education provided;Falls prevention discussed  Falls  evaluation completed;Falls prevention discussed  Falls evaluation completed    MEDICARE RISK AT HOME: Medicare Risk at Home Any stairs in or around the home?: Yes (patient uses ramp) If so, are there any without handrails?: No Home free of loose throw rugs in walkways, pet beds, electrical cords, etc?: Yes Adequate lighting in your home to reduce risk of falls?: Yes Use of a cane, walker or w/c?: Yes Grab bars in the bathroom?: No Shower chair or bench in shower?: No Elevated toilet seat or a handicapped toilet?: No  TIMED UP AND GO:  Was the test performed?  No    Cognitive Function:    05/01/2022    4:26 PM 03/27/2021   11:32 AM  MMSE - Mini Mental State Exam  Orientation to time 2 4  Orientation to Place 5 5  Registration 3 3  Attention/ Calculation 0 0  Recall 3 0  Language- name 2 objects 2 2  Language- repeat 1 1  Language- follow 3 step command 3 3  Language- read & follow direction 1 1  Write a sentence 1 1  Copy design 1 0  Total score 22 20        06/22/2024    2:58 PM 06/19/2023    2:30 PM 04/17/2022   10:19 AM  6CIT Screen  What Year? 0 points 0 points 0 points  What month? 0 points 0 points 0 points  What time? 0 points 0 points 0 points  Count back from 20 2 points 2 points 2 points  Months in reverse 4 points 4 points 4 points  Repeat phrase 4 points 4 points 2 points  Total Score 10 points 10 points 8 points    Immunizations Immunization History  Administered Date(s) Administered   Fluad Quad(high Dose 65+) 08/15/2020, 10/17/2022   Influenza, Seasonal, Injecte, Preservative Fre 08/19/2018   Influenza-Unspecified 08/19/2018   PFIZER(Purple Top)SARS-COV-2 Vaccination 01/18/2020, 02/08/2020   Pneumococcal Conjugate,unspecified 08/19/2017   Pneumococcal Conjugate-13 08/19/2017   Pneumococcal Polysaccharide-23 08/20/2015    TDAP status: Due, Education has been provided regarding the importance of this vaccine. Advised may receive this vaccine at  local pharmacy or Health Dept. Aware to provide a copy of the vaccination record if obtained from local pharmacy or Health Dept. Verbalized acceptance and understanding.  Flu Vaccine status: Due, Education has been provided regarding the importance of this vaccine. Advised may receive this vaccine at local pharmacy or Health Dept. Aware to provide a copy of the vaccination record if obtained from local pharmacy or Health Dept. Verbalized acceptance and understanding.  Pneumococcal vaccine status: Up to date  Covid-19 vaccine status: Information provided on how to obtain vaccines.   Qualifies for Shingles Vaccine? Yes   Zostavax completed No   Shingrix Completed?: No.    Education has been provided regarding the importance of this vaccine. Patient has been advised to call insurance company to determine out of pocket expense if they have not yet received this vaccine. Advised may also receive vaccine at local pharmacy or Health Dept. Verbalized acceptance and understanding.  Screening Tests Health Maintenance  Topic Date Due   Hepatitis C Screening  Never done   Zoster Vaccines- Shingrix (1 of 2) Never done   Colonoscopy  09/16/2015   COVID-19 Vaccine (3 - Pfizer risk series) 03/07/2020   Lung Cancer Screening  08/31/2021   Medicare Annual Wellness (AWV)  06/18/2024   INFLUENZA VACCINE  06/19/2024   Pneumococcal Vaccine: 50+ Years  Completed   Hepatitis B Vaccines  Aged Out   HPV VACCINES  Aged Out   Meningococcal B Vaccine  Aged Out   DTaP/Tdap/Td  Discontinued    Health Maintenance  Health Maintenance Due  Topic Date Due   Hepatitis C Screening  Never done   Zoster Vaccines- Shingrix (1 of 2) Never done   Colonoscopy  09/16/2015   COVID-19 Vaccine (3 - Pfizer risk series) 03/07/2020   Lung Cancer Screening  08/31/2021   Medicare Annual Wellness (AWV)  06/18/2024   INFLUENZA VACCINE  06/19/2024    Lung Cancer Screening: (Low Dose CT Chest recommended if Age 39-80 years, 20  pack-year currently smoking OR have quit w/in 15years.) does qualify.   Lung Cancer Screening Referral: Patient declined at this time  Additional Screening:  Vision Screening: Recommended annual ophthalmology exams for early detection of glaucoma and other disorders of the eye. Is the patient up to date with their annual eye exam?  No   Dental Screening: Recommended annual dental exams for proper oral hygiene   Community Resource Referral / Chronic Care Management: CRR required this visit?  No   CCM required this visit?  No     Plan:    1- Discussed vaccines, colon cancer screening, and lung cancer screening -- patient may do this at a later time  I have personally reviewed and noted the following in the patient's chart:   Medical and social history Use of alcohol, tobacco or illicit drugs  Current medications and supplements including opioid prescriptions.  Functional ability and status Nutritional  status Physical activity Advanced directives List of other physicians Hospitalizations, surgeries, and ER visits in previous 12 months Vitals Screenings to include cognitive, depression, and falls Referrals and appointments  In addition, I have reviewed and discussed with patient certain preventive protocols, quality metrics, and best practice recommendations. A written personalized care plan for preventive services as well as general preventive health recommendations were provided to patient.     Suzen CHRISTELLA Sharps, LPN   11/23/7972

## 2024-06-22 NOTE — Patient Instructions (Signed)
 [name] , Thank you for taking time to come for your Medicare Wellness Visit. I appreciate your ongoing commitment to your health goals. Please review the following plan we discussed and let me know if I can assist you in the future.      This is a list of the screening recommended for you and due dates:  Health Maintenance  Topic Date Due   Hepatitis C Screening  Never done   Zoster (Shingles) Vaccine (1 of 2) Never done   Colon Cancer Screening  09/16/2015   COVID-19 Vaccine (3 - Pfizer risk series) 03/07/2020   Screening for Lung Cancer  08/31/2021   Flu Shot  08/22/2024*   Medicare Annual Wellness Visit  06/22/2025   Pneumococcal Vaccine for age over 31  Completed   Hepatitis B Vaccine  Aged Out   HPV Vaccine  Aged Out   Meningitis B Vaccine  Aged Out   DTaP/Tdap/Td vaccine  Discontinued  *Topic was postponed. The date shown is not the original due date.    Preventive Care 39 Years and Older, Male  Preventive care refers to lifestyle choices and visits with your health care provider that can promote health and wellness. What does preventive care include? A yearly physical exam. This is also called an annual well check. Dental exams once or twice a year. Routine eye exams. Ask your health care provider how often you should have your eyes checked. Personal lifestyle choices, including: Daily care of your teeth and gums. Regular physical activity. Eating a healthy diet. Avoiding tobacco and drug use. Limiting alcohol use. Practicing safe sex. Taking low doses of aspirin  every day. Taking vitamin and mineral supplements as recommended by your health care provider. What happens during an annual well check? The services and screenings done by your health care provider during your annual well check will depend on your age, overall health, lifestyle risk factors, and family history of disease. Counseling  Your health care provider may ask you questions about your: Alcohol  use. Tobacco use. Drug use. Emotional well-being. Home and relationship well-being. Sexual activity. Eating habits. History of falls. Memory and ability to understand (cognition). Work and work Astronomer. Screening  You may have the following tests or measurements: Height, weight, and BMI. Blood pressure. Lipid and cholesterol levels. These may be checked every 5 years, or more frequently if you are over 10 years old. Skin check. Lung cancer screening. You may have this screening every year starting at age 37 if you have a 30-pack-year history of smoking and currently smoke or have quit within the past 15 years. Fecal occult blood test (FOBT) of the stool. You may have this test every year starting at age 34. Flexible sigmoidoscopy or colonoscopy. You may have a sigmoidoscopy every 5 years or a colonoscopy every 10 years starting at age 80. Prostate cancer screening. Recommendations will vary depending on your family history and other risks. Hepatitis C blood test. Hepatitis B blood test. Sexually transmitted disease (STD) testing. Diabetes screening. This is done by checking your blood sugar (glucose) after you have not eaten for a while (fasting). You may have this done every 1-3 years. Abdominal aortic aneurysm (AAA) screening. You may need this if you are a current or former smoker. Osteoporosis. You may be screened starting at age 35 if you are at high risk. Talk with your health care provider about your test results, treatment options, and if necessary, the need for more tests. Vaccines  Your health care provider  may recommend certain vaccines, such as: Influenza vaccine. This is recommended every year. Tetanus, diphtheria, and acellular pertussis (Tdap, Td) vaccine. You may need a Td booster every 10 years. Zoster vaccine. You may need this after age 19. Pneumococcal 13-valent conjugate (PCV13) vaccine. One dose is recommended after age 69. Pneumococcal polysaccharide  (PPSV23) vaccine. One dose is recommended after age 26. Talk to your health care provider about which screenings and vaccines you need and how often you need them. This information is not intended to replace advice given to you by your health care provider. Make sure you discuss any questions you have with your health care provider. Document Released: 12/02/2015 Document Revised: 07/25/2016 Document Reviewed: 09/06/2015 Elsevier Interactive Patient Education  2017 [employer].  Fall Prevention in the Home Falls can cause injuries. They can happen to people of all ages. There are many things you can do to make your home safe and to help prevent falls. What can I do on the outside of my home? Regularly fix the edges of walkways and driveways and fix any cracks. Remove anything that might make you trip as you walk through a door, such as a raised step or threshold. Trim any bushes or trees on the path to your home. Use bright outdoor lighting. Clear any walking paths of anything that might make someone trip, such as rocks or tools. Regularly check to see if handrails are loose or broken. Make sure that both sides of any steps have handrails. Any raised decks and porches should have guardrails on the edges. Have any leaves, snow, or ice cleared regularly. Use sand or salt on walking paths during winter. Clean up any spills in your garage right away. This includes oil or grease spills. What can I do in the bathroom? Use night lights. Install grab bars by the toilet and in the tub and shower. Do not use towel bars as grab bars. Use non-skid mats or decals in the tub or shower. If you need to sit down in the shower, use a plastic, non-slip stool. Keep the floor dry. Clean up any water that spills on the floor as soon as it happens. Remove soap buildup in the tub or shower regularly. Attach bath mats securely with double-sided non-slip rug tape. Do not have throw rugs and other things on the  floor that can make you trip. What can I do in the bedroom? Use night lights. Make sure that you have a light by your bed that is easy to reach. Do not use any sheets or blankets that are too big for your bed. They should not hang down onto the floor. Have a firm chair that has side arms. You can use this for support while you get dressed. Do not have throw rugs and other things on the floor that can make you trip. What can I do in the kitchen? Clean up any spills right away. Avoid walking on wet floors. Keep items that you use a lot in easy-to-reach places. If you need to reach something above you, use a strong step stool that has a grab bar. Keep electrical cords out of the way. Do not use floor polish or wax that makes floors slippery. If you must use wax, use non-skid floor wax. Do not have throw rugs and other things on the floor that can make you trip. What can I do with my stairs? Do not leave any items on the stairs. Make sure that there are handrails on both sides  of the stairs and use them. Fix handrails that are broken or loose. Make sure that handrails are as long as the stairways. Check any carpeting to make sure that it is firmly attached to the stairs. Fix any carpet that is loose or worn. Avoid having throw rugs at the top or bottom of the stairs. If you do have throw rugs, attach them to the floor with carpet tape. Make sure that you have a light switch at the top of the stairs and the bottom of the stairs. If you do not have them, ask someone to add them for you. What else can I do to help prevent falls? Wear shoes that: Do not have high heels. Have rubber bottoms. Are comfortable and fit you well. Are closed at the toe. Do not wear sandals. If you use a stepladder: Make sure that it is fully opened. Do not climb a closed stepladder. Make sure that both sides of the stepladder are locked into place. Ask someone to hold it for you, if possible. Clearly mark and make  sure that you can see: Any grab bars or handrails. First and last steps. Where the edge of each step is. Use tools that help you move around (mobility aids) if they are needed. These include: Canes. Walkers. Scooters. Crutches. Turn on the lights when you go into a dark area. Replace any light bulbs as soon as they burn out. Set up your furniture so you have a clear path. Avoid moving your furniture around. If any of your floors are uneven, fix them. If there are any pets around you, be aware of where they are. Review your medicines with your doctor. Some medicines can make you feel dizzy. This can increase your chance of falling. Ask your doctor what other things that you can do to help prevent falls. This information is not intended to replace advice given to you by your health care provider. Make sure you discuss any questions you have with your health care provider. Document Released: 09/01/2009 Document Revised: 04/12/2016 Document Reviewed: 12/10/2014 Elsevier Interactive Patient Education  2017 [employer].

## 2024-06-23 ENCOUNTER — Ambulatory Visit: Admitting: Family Medicine

## 2024-06-24 ENCOUNTER — Ambulatory Visit: Admitting: Family Medicine

## 2024-06-24 DIAGNOSIS — J449 Chronic obstructive pulmonary disease, unspecified: Secondary | ICD-10-CM | POA: Diagnosis not present

## 2024-06-26 ENCOUNTER — Other Ambulatory Visit: Payer: Self-pay | Admitting: Family Medicine

## 2024-06-26 ENCOUNTER — Other Ambulatory Visit: Payer: Self-pay

## 2024-06-26 DIAGNOSIS — G2581 Restless legs syndrome: Secondary | ICD-10-CM

## 2024-06-26 DIAGNOSIS — J449 Chronic obstructive pulmonary disease, unspecified: Secondary | ICD-10-CM | POA: Diagnosis not present

## 2024-06-30 ENCOUNTER — Ambulatory Visit: Admitting: Family Medicine

## 2024-07-01 ENCOUNTER — Telehealth: Payer: Self-pay | Admitting: Family Medicine

## 2024-07-01 NOTE — Telephone Encounter (Signed)
 Copied from CRM [id number]. Topic: Referral - Request for Referral >> Jul 01, 2024  3:30 PM Wess RAMAN wrote: Did the patient discuss referral with their provider in the last year? Yes (If No - schedule appointment) (If Yes - send message)  Appointment offered? No  Type of order/referral and detailed reason for visit: Home physical therapy  Preference of office, provider, location: no preference  If referral order, have you been seen by this specialty before? No (If Yes, this issue or another issue? When? Where?  Can we respond through MyChart? Yes

## 2024-07-06 ENCOUNTER — Other Ambulatory Visit: Payer: Self-pay | Admitting: Family Medicine

## 2024-07-06 DIAGNOSIS — I4891 Unspecified atrial fibrillation: Secondary | ICD-10-CM | POA: Diagnosis not present

## 2024-07-06 DIAGNOSIS — J189 Pneumonia, unspecified organism: Secondary | ICD-10-CM | POA: Diagnosis not present

## 2024-07-06 DIAGNOSIS — Z6841 Body Mass Index (BMI) 40.0 and over, adult: Secondary | ICD-10-CM | POA: Diagnosis not present

## 2024-07-06 DIAGNOSIS — Z888 Allergy status to other drugs, medicaments and biological substances status: Secondary | ICD-10-CM | POA: Diagnosis not present

## 2024-07-06 DIAGNOSIS — Z880 Allergy status to penicillin: Secondary | ICD-10-CM | POA: Diagnosis not present

## 2024-07-06 DIAGNOSIS — J9601 Acute respiratory failure with hypoxia: Secondary | ICD-10-CM | POA: Diagnosis not present

## 2024-07-06 DIAGNOSIS — G2581 Restless legs syndrome: Secondary | ICD-10-CM | POA: Diagnosis not present

## 2024-07-06 DIAGNOSIS — M1990 Unspecified osteoarthritis, unspecified site: Secondary | ICD-10-CM | POA: Diagnosis not present

## 2024-07-06 DIAGNOSIS — R531 Weakness: Secondary | ICD-10-CM | POA: Diagnosis not present

## 2024-07-06 DIAGNOSIS — J810 Acute pulmonary edema: Secondary | ICD-10-CM | POA: Diagnosis not present

## 2024-07-06 DIAGNOSIS — E7800 Pure hypercholesterolemia, unspecified: Secondary | ICD-10-CM | POA: Diagnosis not present

## 2024-07-06 DIAGNOSIS — J440 Chronic obstructive pulmonary disease with acute lower respiratory infection: Secondary | ICD-10-CM | POA: Diagnosis not present

## 2024-07-06 DIAGNOSIS — F05 Delirium due to known physiological condition: Secondary | ICD-10-CM | POA: Diagnosis not present

## 2024-07-06 DIAGNOSIS — Z8701 Personal history of pneumonia (recurrent): Secondary | ICD-10-CM | POA: Diagnosis not present

## 2024-07-06 DIAGNOSIS — R609 Edema, unspecified: Secondary | ICD-10-CM | POA: Diagnosis not present

## 2024-07-06 DIAGNOSIS — R062 Wheezing: Secondary | ICD-10-CM | POA: Diagnosis not present

## 2024-07-06 DIAGNOSIS — Z881 Allergy status to other antibiotic agents status: Secondary | ICD-10-CM | POA: Diagnosis not present

## 2024-07-06 DIAGNOSIS — Z885 Allergy status to narcotic agent status: Secondary | ICD-10-CM | POA: Diagnosis not present

## 2024-07-06 DIAGNOSIS — J441 Chronic obstructive pulmonary disease with (acute) exacerbation: Secondary | ICD-10-CM | POA: Diagnosis not present

## 2024-07-06 DIAGNOSIS — I110 Hypertensive heart disease with heart failure: Secondary | ICD-10-CM | POA: Diagnosis not present

## 2024-07-06 DIAGNOSIS — J158 Pneumonia due to other specified bacteria: Secondary | ICD-10-CM | POA: Diagnosis not present

## 2024-07-06 DIAGNOSIS — I252 Old myocardial infarction: Secondary | ICD-10-CM | POA: Diagnosis not present

## 2024-07-06 DIAGNOSIS — J9600 Acute respiratory failure, unspecified whether with hypoxia or hypercapnia: Secondary | ICD-10-CM | POA: Diagnosis not present

## 2024-07-06 DIAGNOSIS — Z7401 Bed confinement status: Secondary | ICD-10-CM | POA: Diagnosis not present

## 2024-07-06 DIAGNOSIS — Z9981 Dependence on supplemental oxygen: Secondary | ICD-10-CM | POA: Diagnosis not present

## 2024-07-06 DIAGNOSIS — E876 Hypokalemia: Secondary | ICD-10-CM | POA: Diagnosis not present

## 2024-07-06 DIAGNOSIS — I5043 Acute on chronic combined systolic (congestive) and diastolic (congestive) heart failure: Secondary | ICD-10-CM | POA: Diagnosis not present

## 2024-07-06 DIAGNOSIS — M109 Gout, unspecified: Secondary | ICD-10-CM | POA: Diagnosis not present

## 2024-07-06 DIAGNOSIS — J9622 Acute and chronic respiratory failure with hypercapnia: Secondary | ICD-10-CM | POA: Diagnosis not present

## 2024-07-06 DIAGNOSIS — Z87442 Personal history of urinary calculi: Secondary | ICD-10-CM | POA: Diagnosis not present

## 2024-07-06 DIAGNOSIS — F17210 Nicotine dependence, cigarettes, uncomplicated: Secondary | ICD-10-CM | POA: Diagnosis not present

## 2024-07-06 DIAGNOSIS — I10 Essential (primary) hypertension: Secondary | ICD-10-CM | POA: Diagnosis not present

## 2024-07-06 DIAGNOSIS — I5042 Chronic combined systolic (congestive) and diastolic (congestive) heart failure: Secondary | ICD-10-CM | POA: Diagnosis not present

## 2024-07-06 DIAGNOSIS — R0602 Shortness of breath: Secondary | ICD-10-CM | POA: Diagnosis not present

## 2024-07-06 DIAGNOSIS — I509 Heart failure, unspecified: Secondary | ICD-10-CM | POA: Diagnosis not present

## 2024-07-06 DIAGNOSIS — E785 Hyperlipidemia, unspecified: Secondary | ICD-10-CM | POA: Diagnosis not present

## 2024-07-06 DIAGNOSIS — N400 Enlarged prostate without lower urinary tract symptoms: Secondary | ICD-10-CM | POA: Diagnosis not present

## 2024-07-06 DIAGNOSIS — J9621 Acute and chronic respiratory failure with hypoxia: Secondary | ICD-10-CM | POA: Diagnosis not present

## 2024-07-06 DIAGNOSIS — F0393 Unspecified dementia, unspecified severity, with mood disturbance: Secondary | ICD-10-CM | POA: Diagnosis not present

## 2024-07-07 ENCOUNTER — Telehealth: Payer: Self-pay

## 2024-07-07 NOTE — Telephone Encounter (Signed)
 Dee from [employer] called and needed a copy of patient med list, patient is currently in the hospital, med list was faxed as requested.

## 2024-07-10 DIAGNOSIS — L988 Other specified disorders of the skin and subcutaneous tissue: Secondary | ICD-10-CM | POA: Diagnosis not present

## 2024-07-10 DIAGNOSIS — L89893 Pressure ulcer of other site, stage 3: Secondary | ICD-10-CM | POA: Diagnosis not present

## 2024-07-10 DIAGNOSIS — J441 Chronic obstructive pulmonary disease with (acute) exacerbation: Secondary | ICD-10-CM | POA: Diagnosis not present

## 2024-07-10 DIAGNOSIS — Z7401 Bed confinement status: Secondary | ICD-10-CM | POA: Diagnosis not present

## 2024-07-10 DIAGNOSIS — J158 Pneumonia due to other specified bacteria: Secondary | ICD-10-CM | POA: Diagnosis not present

## 2024-07-10 DIAGNOSIS — I5043 Acute on chronic combined systolic (congestive) and diastolic (congestive) heart failure: Secondary | ICD-10-CM | POA: Diagnosis not present

## 2024-07-10 DIAGNOSIS — F338 Other recurrent depressive disorders: Secondary | ICD-10-CM | POA: Diagnosis not present

## 2024-07-10 DIAGNOSIS — I4891 Unspecified atrial fibrillation: Secondary | ICD-10-CM | POA: Diagnosis not present

## 2024-07-10 DIAGNOSIS — I5042 Chronic combined systolic (congestive) and diastolic (congestive) heart failure: Secondary | ICD-10-CM | POA: Diagnosis not present

## 2024-07-10 DIAGNOSIS — F05 Delirium due to known physiological condition: Secondary | ICD-10-CM | POA: Diagnosis not present

## 2024-07-10 DIAGNOSIS — F5105 Insomnia due to other mental disorder: Secondary | ICD-10-CM | POA: Diagnosis not present

## 2024-07-10 DIAGNOSIS — E785 Hyperlipidemia, unspecified: Secondary | ICD-10-CM | POA: Diagnosis not present

## 2024-07-10 DIAGNOSIS — J9622 Acute and chronic respiratory failure with hypercapnia: Secondary | ICD-10-CM | POA: Diagnosis not present

## 2024-07-10 DIAGNOSIS — J9600 Acute respiratory failure, unspecified whether with hypoxia or hypercapnia: Secondary | ICD-10-CM | POA: Diagnosis not present

## 2024-07-10 DIAGNOSIS — R0602 Shortness of breath: Secondary | ICD-10-CM | POA: Diagnosis not present

## 2024-07-10 DIAGNOSIS — Z6841 Body Mass Index (BMI) 40.0 and over, adult: Secondary | ICD-10-CM | POA: Diagnosis not present

## 2024-07-10 DIAGNOSIS — I10 Essential (primary) hypertension: Secondary | ICD-10-CM | POA: Diagnosis not present

## 2024-07-10 DIAGNOSIS — J449 Chronic obstructive pulmonary disease, unspecified: Secondary | ICD-10-CM | POA: Diagnosis not present

## 2024-07-10 DIAGNOSIS — J189 Pneumonia, unspecified organism: Secondary | ICD-10-CM | POA: Diagnosis not present

## 2024-07-10 DIAGNOSIS — R531 Weakness: Secondary | ICD-10-CM | POA: Diagnosis not present

## 2024-07-10 DIAGNOSIS — J9621 Acute and chronic respiratory failure with hypoxia: Secondary | ICD-10-CM | POA: Diagnosis not present

## 2024-07-10 DIAGNOSIS — F03A0 Unspecified dementia, mild, without behavioral disturbance, psychotic disturbance, mood disturbance, and anxiety: Secondary | ICD-10-CM | POA: Diagnosis not present

## 2024-07-10 DIAGNOSIS — J9601 Acute respiratory failure with hypoxia: Secondary | ICD-10-CM | POA: Diagnosis not present

## 2024-07-14 ENCOUNTER — Ambulatory Visit: Admitting: Family Medicine

## 2024-07-14 ENCOUNTER — Telehealth: Payer: Self-pay

## 2024-07-14 DIAGNOSIS — J189 Pneumonia, unspecified organism: Secondary | ICD-10-CM | POA: Diagnosis not present

## 2024-07-14 DIAGNOSIS — J441 Chronic obstructive pulmonary disease with (acute) exacerbation: Secondary | ICD-10-CM | POA: Diagnosis not present

## 2024-07-14 DIAGNOSIS — J9621 Acute and chronic respiratory failure with hypoxia: Secondary | ICD-10-CM | POA: Diagnosis not present

## 2024-07-14 DIAGNOSIS — I5043 Acute on chronic combined systolic (congestive) and diastolic (congestive) heart failure: Secondary | ICD-10-CM | POA: Diagnosis not present

## 2024-07-14 NOTE — Telephone Encounter (Signed)
 Left message on Shawn Osborn's vm informing him that per patients chart patient takes seroquel  for depression and for sleep.   Copied from CRM [id number]. Topic: Clinical - Medication Question >> Jul 14, 2024  1:18 PM Debby BROCKS wrote: Reason for CRM: Shawn Osborn from [facility] wants to know why the patient was placed on QUEtiapine  (SEROQUEL ) 50 MG tablet and if there was a diagnosis for it. As they are caring for him they need to know more information   Fax: [phone] Call: [phone] Ext:[phone]

## 2024-07-15 DIAGNOSIS — L89893 Pressure ulcer of other site, stage 3: Secondary | ICD-10-CM | POA: Diagnosis not present

## 2024-07-15 DIAGNOSIS — L988 Other specified disorders of the skin and subcutaneous tissue: Secondary | ICD-10-CM | POA: Diagnosis not present

## 2024-07-22 DIAGNOSIS — L89893 Pressure ulcer of other site, stage 3: Secondary | ICD-10-CM | POA: Diagnosis not present

## 2024-07-25 DIAGNOSIS — J449 Chronic obstructive pulmonary disease, unspecified: Secondary | ICD-10-CM | POA: Diagnosis not present

## 2024-07-27 DIAGNOSIS — J449 Chronic obstructive pulmonary disease, unspecified: Secondary | ICD-10-CM | POA: Diagnosis not present

## 2024-07-29 DIAGNOSIS — L89893 Pressure ulcer of other site, stage 3: Secondary | ICD-10-CM | POA: Diagnosis not present

## 2024-08-05 DIAGNOSIS — L89893 Pressure ulcer of other site, stage 3: Secondary | ICD-10-CM | POA: Diagnosis not present

## 2024-08-15 DIAGNOSIS — F0390 Unspecified dementia without behavioral disturbance: Secondary | ICD-10-CM | POA: Diagnosis not present

## 2024-08-15 DIAGNOSIS — Z6841 Body Mass Index (BMI) 40.0 and over, adult: Secondary | ICD-10-CM | POA: Diagnosis not present

## 2024-08-15 DIAGNOSIS — I5042 Chronic combined systolic (congestive) and diastolic (congestive) heart failure: Secondary | ICD-10-CM | POA: Diagnosis not present

## 2024-08-15 DIAGNOSIS — I4891 Unspecified atrial fibrillation: Secondary | ICD-10-CM | POA: Diagnosis not present

## 2024-08-15 DIAGNOSIS — J9611 Chronic respiratory failure with hypoxia: Secondary | ICD-10-CM | POA: Diagnosis not present

## 2024-08-15 DIAGNOSIS — J9622 Acute and chronic respiratory failure with hypercapnia: Secondary | ICD-10-CM | POA: Diagnosis not present

## 2024-08-21 DIAGNOSIS — I509 Heart failure, unspecified: Secondary | ICD-10-CM | POA: Diagnosis not present

## 2024-08-27 DIAGNOSIS — R059 Cough, unspecified: Secondary | ICD-10-CM | POA: Diagnosis not present

## 2024-09-05 DIAGNOSIS — N390 Urinary tract infection, site not specified: Secondary | ICD-10-CM | POA: Diagnosis not present

## 2024-09-05 DIAGNOSIS — D649 Anemia, unspecified: Secondary | ICD-10-CM | POA: Diagnosis not present

## 2024-11-17 DIAGNOSIS — R0602 Shortness of breath: Secondary | ICD-10-CM | POA: Diagnosis not present

## 2024-11-23 DIAGNOSIS — J9620 Acute and chronic respiratory failure, unspecified whether with hypoxia or hypercapnia: Secondary | ICD-10-CM

## 2024-11-23 DIAGNOSIS — I509 Heart failure, unspecified: Secondary | ICD-10-CM

## 2024-11-23 DIAGNOSIS — I4891 Unspecified atrial fibrillation: Secondary | ICD-10-CM | POA: Diagnosis not present

## 2024-11-23 DIAGNOSIS — I4819 Other persistent atrial fibrillation: Secondary | ICD-10-CM | POA: Diagnosis not present

## 2024-11-24 DIAGNOSIS — I4819 Other persistent atrial fibrillation: Secondary | ICD-10-CM | POA: Diagnosis not present

## 2024-11-24 DIAGNOSIS — J9620 Acute and chronic respiratory failure, unspecified whether with hypoxia or hypercapnia: Secondary | ICD-10-CM | POA: Diagnosis not present

## 2024-11-24 DIAGNOSIS — I509 Heart failure, unspecified: Secondary | ICD-10-CM | POA: Diagnosis not present

## 2024-11-25 DIAGNOSIS — J9620 Acute and chronic respiratory failure, unspecified whether with hypoxia or hypercapnia: Secondary | ICD-10-CM | POA: Diagnosis not present

## 2024-11-25 DIAGNOSIS — I509 Heart failure, unspecified: Secondary | ICD-10-CM | POA: Diagnosis not present

## 2024-11-25 DIAGNOSIS — I4819 Other persistent atrial fibrillation: Secondary | ICD-10-CM | POA: Diagnosis not present

## 2024-11-26 DIAGNOSIS — I4819 Other persistent atrial fibrillation: Secondary | ICD-10-CM | POA: Diagnosis not present

## 2024-11-26 DIAGNOSIS — J9620 Acute and chronic respiratory failure, unspecified whether with hypoxia or hypercapnia: Secondary | ICD-10-CM | POA: Diagnosis not present

## 2024-11-26 DIAGNOSIS — I4891 Unspecified atrial fibrillation: Secondary | ICD-10-CM

## 2024-11-26 DIAGNOSIS — I509 Heart failure, unspecified: Secondary | ICD-10-CM | POA: Diagnosis not present

## 2024-12-20 DEATH — deceased
